# Patient Record
Sex: Female | Born: 1935 | Race: White | Hispanic: No | Marital: Married | Smoking: Never smoker
Health system: Southern US, Community
[De-identification: ages and names within clinical notes are randomized; demographics above are authoritative.]

## PROBLEM LIST (undated history)

## (undated) DIAGNOSIS — I2510 Atherosclerotic heart disease of native coronary artery without angina pectoris: Secondary | ICD-10-CM

## (undated) DIAGNOSIS — J45909 Unspecified asthma, uncomplicated: Secondary | ICD-10-CM

## (undated) DIAGNOSIS — E119 Type 2 diabetes mellitus without complications: Secondary | ICD-10-CM

## (undated) DIAGNOSIS — N189 Chronic kidney disease, unspecified: Principal | ICD-10-CM

## (undated) DIAGNOSIS — Z955 Presence of coronary angioplasty implant and graft: Secondary | ICD-10-CM

## (undated) DIAGNOSIS — J449 Chronic obstructive pulmonary disease, unspecified: Secondary | ICD-10-CM

## (undated) DIAGNOSIS — I679 Cerebrovascular disease, unspecified: Secondary | ICD-10-CM

## (undated) DIAGNOSIS — I4892 Unspecified atrial flutter: Secondary | ICD-10-CM

## (undated) DIAGNOSIS — E039 Hypothyroidism, unspecified: Secondary | ICD-10-CM

## (undated) DIAGNOSIS — I701 Atherosclerosis of renal artery: Secondary | ICD-10-CM

## (undated) HISTORY — DX: Type 2 diabetes mellitus without complications: E11.9

## (undated) HISTORY — DX: Atherosclerotic heart disease of native coronary artery without angina pectoris: I25.10

## (undated) HISTORY — PX: BACK SURGERY: SHX140

## (undated) HISTORY — DX: Atherosclerosis of renal artery: I70.1

## (undated) HISTORY — PX: TONSILLECTOMY: SUR1361

## (undated) HISTORY — PX: CHOLECYSTECTOMY: SHX55

## (undated) HISTORY — PX: CORONARY ARTERY BYPASS GRAFT: SHX141

## (undated) HISTORY — PX: OTHER SURGICAL HISTORY: SHX169

## (undated) HISTORY — DX: Hypothyroidism, unspecified: E03.9

## (undated) HISTORY — DX: Cerebrovascular disease, unspecified: I67.9

## (undated) HISTORY — PX: SHOULDER SURGERY: SHX246

## (undated) HISTORY — PX: KNEE SURGERY: SHX244

## (undated) HISTORY — PX: APPENDECTOMY: SHX54

## (undated) HISTORY — DX: Chronic kidney disease, unspecified: N18.9

## (undated) HISTORY — PX: NASAL SINUS SURGERY: SHX719

## (undated) HISTORY — DX: Unspecified asthma, uncomplicated: J45.909

## (undated) HISTORY — DX: Unspecified atrial flutter: I48.92

## (undated) HISTORY — DX: Presence of coronary angioplasty implant and graft: Z95.5

## (undated) HISTORY — PX: ABDOMINAL HYSTERECTOMY: SHX81

## (undated) HISTORY — DX: Chronic obstructive pulmonary disease, unspecified: J44.9

---

## 2011-09-18 DIAGNOSIS — I798 Other disorders of arteries, arterioles and capillaries in diseases classified elsewhere: Secondary | ICD-10-CM | POA: Diagnosis present

## 2011-09-18 DIAGNOSIS — D72829 Elevated white blood cell count, unspecified: Secondary | ICD-10-CM | POA: Diagnosis not present

## 2011-09-18 DIAGNOSIS — J9600 Acute respiratory failure, unspecified whether with hypoxia or hypercapnia: Secondary | ICD-10-CM | POA: Diagnosis not present

## 2011-09-18 DIAGNOSIS — A419 Sepsis, unspecified organism: Secondary | ICD-10-CM | POA: Diagnosis not present

## 2011-09-18 DIAGNOSIS — D68318 Other hemorrhagic disorder due to intrinsic circulating anticoagulants, antibodies, or inhibitors: Secondary | ICD-10-CM | POA: Diagnosis not present

## 2011-09-18 DIAGNOSIS — I509 Heart failure, unspecified: Secondary | ICD-10-CM | POA: Diagnosis not present

## 2011-09-18 DIAGNOSIS — D62 Acute posthemorrhagic anemia: Secondary | ICD-10-CM | POA: Diagnosis not present

## 2011-09-18 DIAGNOSIS — N179 Acute kidney failure, unspecified: Secondary | ICD-10-CM | POA: Diagnosis not present

## 2011-09-18 DIAGNOSIS — D509 Iron deficiency anemia, unspecified: Secondary | ICD-10-CM | POA: Diagnosis not present

## 2011-09-18 DIAGNOSIS — Z7901 Long term (current) use of anticoagulants: Secondary | ICD-10-CM | POA: Diagnosis not present

## 2011-09-18 DIAGNOSIS — N281 Cyst of kidney, acquired: Secondary | ICD-10-CM | POA: Diagnosis not present

## 2011-09-18 DIAGNOSIS — Z951 Presence of aortocoronary bypass graft: Secondary | ICD-10-CM | POA: Diagnosis not present

## 2011-09-18 DIAGNOSIS — N135 Crossing vessel and stricture of ureter without hydronephrosis: Secondary | ICD-10-CM | POA: Diagnosis not present

## 2011-09-18 DIAGNOSIS — E119 Type 2 diabetes mellitus without complications: Secondary | ICD-10-CM | POA: Diagnosis not present

## 2011-09-18 DIAGNOSIS — J449 Chronic obstructive pulmonary disease, unspecified: Secondary | ICD-10-CM | POA: Diagnosis present

## 2011-09-18 DIAGNOSIS — N189 Chronic kidney disease, unspecified: Secondary | ICD-10-CM | POA: Diagnosis not present

## 2011-09-18 DIAGNOSIS — R011 Cardiac murmur, unspecified: Secondary | ICD-10-CM | POA: Diagnosis not present

## 2011-09-18 DIAGNOSIS — E875 Hyperkalemia: Secondary | ICD-10-CM | POA: Diagnosis not present

## 2011-09-18 DIAGNOSIS — B9689 Other specified bacterial agents as the cause of diseases classified elsewhere: Secondary | ICD-10-CM | POA: Diagnosis not present

## 2011-09-18 DIAGNOSIS — N209 Urinary calculus, unspecified: Secondary | ICD-10-CM | POA: Diagnosis not present

## 2011-09-18 DIAGNOSIS — R7989 Other specified abnormal findings of blood chemistry: Secondary | ICD-10-CM | POA: Diagnosis not present

## 2011-09-18 DIAGNOSIS — T8389XA Other specified complication of genitourinary prosthetic devices, implants and grafts, initial encounter: Secondary | ICD-10-CM | POA: Diagnosis not present

## 2011-09-18 DIAGNOSIS — N1330 Unspecified hydronephrosis: Secondary | ICD-10-CM | POA: Diagnosis present

## 2011-09-18 DIAGNOSIS — I129 Hypertensive chronic kidney disease with stage 1 through stage 4 chronic kidney disease, or unspecified chronic kidney disease: Secondary | ICD-10-CM | POA: Diagnosis present

## 2011-09-18 DIAGNOSIS — Z7982 Long term (current) use of aspirin: Secondary | ICD-10-CM | POA: Diagnosis not present

## 2011-09-18 DIAGNOSIS — N186 End stage renal disease: Secondary | ICD-10-CM | POA: Diagnosis not present

## 2011-09-18 DIAGNOSIS — Z96659 Presence of unspecified artificial knee joint: Secondary | ICD-10-CM | POA: Diagnosis not present

## 2011-09-18 DIAGNOSIS — D684 Acquired coagulation factor deficiency: Secondary | ICD-10-CM | POA: Diagnosis not present

## 2011-09-18 DIAGNOSIS — N390 Urinary tract infection, site not specified: Secondary | ICD-10-CM | POA: Diagnosis not present

## 2011-09-18 DIAGNOSIS — J180 Bronchopneumonia, unspecified organism: Secondary | ICD-10-CM | POA: Diagnosis not present

## 2011-09-18 DIAGNOSIS — N170 Acute kidney failure with tubular necrosis: Secondary | ICD-10-CM | POA: Diagnosis not present

## 2011-09-18 DIAGNOSIS — J90 Pleural effusion, not elsewhere classified: Secondary | ICD-10-CM | POA: Diagnosis not present

## 2011-09-18 DIAGNOSIS — N159 Renal tubulo-interstitial disease, unspecified: Secondary | ICD-10-CM | POA: Diagnosis not present

## 2011-09-18 DIAGNOSIS — Z96649 Presence of unspecified artificial hip joint: Secondary | ICD-10-CM | POA: Diagnosis not present

## 2011-09-18 DIAGNOSIS — E1159 Type 2 diabetes mellitus with other circulatory complications: Secondary | ICD-10-CM | POA: Diagnosis present

## 2011-09-18 DIAGNOSIS — N200 Calculus of kidney: Secondary | ICD-10-CM | POA: Diagnosis not present

## 2011-09-18 DIAGNOSIS — J189 Pneumonia, unspecified organism: Secondary | ICD-10-CM | POA: Diagnosis not present

## 2011-09-18 DIAGNOSIS — I10 Essential (primary) hypertension: Secondary | ICD-10-CM | POA: Diagnosis not present

## 2011-09-18 DIAGNOSIS — R079 Chest pain, unspecified: Secondary | ICD-10-CM | POA: Diagnosis not present

## 2011-10-05 DIAGNOSIS — N170 Acute kidney failure with tubular necrosis: Secondary | ICD-10-CM | POA: Diagnosis not present

## 2011-10-05 DIAGNOSIS — I4891 Unspecified atrial fibrillation: Secondary | ICD-10-CM | POA: Diagnosis present

## 2011-10-05 DIAGNOSIS — J449 Chronic obstructive pulmonary disease, unspecified: Secondary | ICD-10-CM | POA: Diagnosis present

## 2011-10-05 DIAGNOSIS — I509 Heart failure, unspecified: Secondary | ICD-10-CM | POA: Diagnosis present

## 2011-10-05 DIAGNOSIS — N179 Acute kidney failure, unspecified: Secondary | ICD-10-CM | POA: Diagnosis not present

## 2011-10-05 DIAGNOSIS — D62 Acute posthemorrhagic anemia: Secondary | ICD-10-CM | POA: Diagnosis not present

## 2011-10-05 DIAGNOSIS — N281 Cyst of kidney, acquired: Secondary | ICD-10-CM | POA: Diagnosis not present

## 2011-10-05 DIAGNOSIS — M7730 Calcaneal spur, unspecified foot: Secondary | ICD-10-CM | POA: Diagnosis not present

## 2011-10-05 DIAGNOSIS — M79609 Pain in unspecified limb: Secondary | ICD-10-CM | POA: Diagnosis not present

## 2011-10-05 DIAGNOSIS — N189 Chronic kidney disease, unspecified: Secondary | ICD-10-CM | POA: Diagnosis not present

## 2011-10-05 DIAGNOSIS — R918 Other nonspecific abnormal finding of lung field: Secondary | ICD-10-CM | POA: Diagnosis not present

## 2011-10-05 DIAGNOSIS — J90 Pleural effusion, not elsewhere classified: Secondary | ICD-10-CM | POA: Diagnosis not present

## 2011-10-05 DIAGNOSIS — R0602 Shortness of breath: Secondary | ICD-10-CM | POA: Diagnosis not present

## 2011-10-05 DIAGNOSIS — J9600 Acute respiratory failure, unspecified whether with hypoxia or hypercapnia: Secondary | ICD-10-CM | POA: Diagnosis not present

## 2011-10-05 DIAGNOSIS — R05 Cough: Secondary | ICD-10-CM | POA: Diagnosis not present

## 2011-10-05 DIAGNOSIS — J180 Bronchopneumonia, unspecified organism: Secondary | ICD-10-CM | POA: Diagnosis not present

## 2011-10-05 DIAGNOSIS — R059 Cough, unspecified: Secondary | ICD-10-CM | POA: Diagnosis not present

## 2011-10-05 DIAGNOSIS — Z792 Long term (current) use of antibiotics: Secondary | ICD-10-CM | POA: Diagnosis not present

## 2011-10-05 DIAGNOSIS — N200 Calculus of kidney: Secondary | ICD-10-CM | POA: Diagnosis not present

## 2011-10-05 DIAGNOSIS — A419 Sepsis, unspecified organism: Secondary | ICD-10-CM | POA: Diagnosis not present

## 2011-10-05 DIAGNOSIS — E119 Type 2 diabetes mellitus without complications: Secondary | ICD-10-CM | POA: Diagnosis not present

## 2011-10-05 DIAGNOSIS — N390 Urinary tract infection, site not specified: Secondary | ICD-10-CM | POA: Diagnosis not present

## 2011-10-05 DIAGNOSIS — D649 Anemia, unspecified: Secondary | ICD-10-CM | POA: Diagnosis not present

## 2011-10-05 DIAGNOSIS — J189 Pneumonia, unspecified organism: Secondary | ICD-10-CM | POA: Diagnosis present

## 2011-10-05 DIAGNOSIS — N1330 Unspecified hydronephrosis: Secondary | ICD-10-CM | POA: Diagnosis not present

## 2011-10-05 DIAGNOSIS — M25879 Other specified joint disorders, unspecified ankle and foot: Secondary | ICD-10-CM | POA: Diagnosis not present

## 2011-10-05 DIAGNOSIS — N289 Disorder of kidney and ureter, unspecified: Secondary | ICD-10-CM | POA: Diagnosis not present

## 2011-10-05 DIAGNOSIS — J984 Other disorders of lung: Secondary | ICD-10-CM | POA: Diagnosis not present

## 2011-10-06 DIAGNOSIS — J9600 Acute respiratory failure, unspecified whether with hypoxia or hypercapnia: Secondary | ICD-10-CM | POA: Diagnosis not present

## 2011-10-07 DIAGNOSIS — R918 Other nonspecific abnormal finding of lung field: Secondary | ICD-10-CM | POA: Diagnosis not present

## 2011-10-07 DIAGNOSIS — J9600 Acute respiratory failure, unspecified whether with hypoxia or hypercapnia: Secondary | ICD-10-CM | POA: Diagnosis not present

## 2011-10-10 DIAGNOSIS — R0602 Shortness of breath: Secondary | ICD-10-CM | POA: Diagnosis not present

## 2011-10-12 DIAGNOSIS — R918 Other nonspecific abnormal finding of lung field: Secondary | ICD-10-CM | POA: Diagnosis not present

## 2011-10-13 DIAGNOSIS — R059 Cough, unspecified: Secondary | ICD-10-CM | POA: Diagnosis not present

## 2011-10-13 DIAGNOSIS — R05 Cough: Secondary | ICD-10-CM | POA: Diagnosis not present

## 2011-10-13 DIAGNOSIS — R918 Other nonspecific abnormal finding of lung field: Secondary | ICD-10-CM | POA: Diagnosis not present

## 2011-10-15 DIAGNOSIS — J189 Pneumonia, unspecified organism: Secondary | ICD-10-CM | POA: Diagnosis not present

## 2011-10-16 DIAGNOSIS — J984 Other disorders of lung: Secondary | ICD-10-CM | POA: Diagnosis not present

## 2011-10-16 DIAGNOSIS — J90 Pleural effusion, not elsewhere classified: Secondary | ICD-10-CM | POA: Diagnosis not present

## 2011-10-16 DIAGNOSIS — R0602 Shortness of breath: Secondary | ICD-10-CM | POA: Diagnosis not present

## 2011-10-17 DIAGNOSIS — N1330 Unspecified hydronephrosis: Secondary | ICD-10-CM | POA: Diagnosis not present

## 2011-10-17 DIAGNOSIS — N281 Cyst of kidney, acquired: Secondary | ICD-10-CM | POA: Diagnosis not present

## 2011-10-17 DIAGNOSIS — N200 Calculus of kidney: Secondary | ICD-10-CM | POA: Diagnosis not present

## 2011-10-18 DIAGNOSIS — M7730 Calcaneal spur, unspecified foot: Secondary | ICD-10-CM | POA: Diagnosis not present

## 2011-10-18 DIAGNOSIS — M79609 Pain in unspecified limb: Secondary | ICD-10-CM | POA: Diagnosis not present

## 2011-10-18 DIAGNOSIS — M25879 Other specified joint disorders, unspecified ankle and foot: Secondary | ICD-10-CM | POA: Diagnosis not present

## 2011-10-19 DIAGNOSIS — E119 Type 2 diabetes mellitus without complications: Secondary | ICD-10-CM | POA: Diagnosis not present

## 2011-10-19 DIAGNOSIS — Z5189 Encounter for other specified aftercare: Secondary | ICD-10-CM | POA: Diagnosis not present

## 2011-10-19 DIAGNOSIS — D649 Anemia, unspecified: Secondary | ICD-10-CM | POA: Diagnosis present

## 2011-10-19 DIAGNOSIS — E039 Hypothyroidism, unspecified: Secondary | ICD-10-CM | POA: Diagnosis present

## 2011-10-19 DIAGNOSIS — J90 Pleural effusion, not elsewhere classified: Secondary | ICD-10-CM | POA: Diagnosis not present

## 2011-10-19 DIAGNOSIS — E785 Hyperlipidemia, unspecified: Secondary | ICD-10-CM | POA: Diagnosis present

## 2011-10-19 DIAGNOSIS — N390 Urinary tract infection, site not specified: Secondary | ICD-10-CM | POA: Diagnosis present

## 2011-10-19 DIAGNOSIS — I509 Heart failure, unspecified: Secondary | ICD-10-CM | POA: Diagnosis present

## 2011-10-19 DIAGNOSIS — N19 Unspecified kidney failure: Secondary | ICD-10-CM | POA: Diagnosis not present

## 2011-10-19 DIAGNOSIS — B3749 Other urogenital candidiasis: Secondary | ICD-10-CM | POA: Diagnosis present

## 2011-10-19 DIAGNOSIS — E875 Hyperkalemia: Secondary | ICD-10-CM | POA: Diagnosis present

## 2011-10-19 DIAGNOSIS — I4891 Unspecified atrial fibrillation: Secondary | ICD-10-CM | POA: Diagnosis present

## 2011-10-19 DIAGNOSIS — F329 Major depressive disorder, single episode, unspecified: Secondary | ICD-10-CM | POA: Diagnosis present

## 2011-10-19 DIAGNOSIS — R5381 Other malaise: Secondary | ICD-10-CM | POA: Diagnosis not present

## 2011-10-19 DIAGNOSIS — J449 Chronic obstructive pulmonary disease, unspecified: Secondary | ICD-10-CM | POA: Diagnosis present

## 2011-10-19 DIAGNOSIS — Z7901 Long term (current) use of anticoagulants: Secondary | ICD-10-CM | POA: Diagnosis not present

## 2011-10-19 DIAGNOSIS — R269 Unspecified abnormalities of gait and mobility: Secondary | ICD-10-CM | POA: Diagnosis not present

## 2011-10-19 DIAGNOSIS — I2510 Atherosclerotic heart disease of native coronary artery without angina pectoris: Secondary | ICD-10-CM | POA: Diagnosis present

## 2011-10-19 DIAGNOSIS — G7281 Critical illness myopathy: Secondary | ICD-10-CM | POA: Diagnosis not present

## 2011-10-19 DIAGNOSIS — I10 Essential (primary) hypertension: Secondary | ICD-10-CM | POA: Diagnosis present

## 2011-11-02 DIAGNOSIS — N135 Crossing vessel and stricture of ureter without hydronephrosis: Secondary | ICD-10-CM | POA: Diagnosis not present

## 2011-11-04 DIAGNOSIS — I2510 Atherosclerotic heart disease of native coronary artery without angina pectoris: Secondary | ICD-10-CM | POA: Diagnosis not present

## 2011-11-04 DIAGNOSIS — N179 Acute kidney failure, unspecified: Secondary | ICD-10-CM | POA: Diagnosis not present

## 2011-11-04 DIAGNOSIS — I4891 Unspecified atrial fibrillation: Secondary | ICD-10-CM | POA: Diagnosis not present

## 2011-11-04 DIAGNOSIS — I509 Heart failure, unspecified: Secondary | ICD-10-CM | POA: Diagnosis not present

## 2011-11-04 DIAGNOSIS — I10 Essential (primary) hypertension: Secondary | ICD-10-CM | POA: Diagnosis not present

## 2011-11-04 DIAGNOSIS — E119 Type 2 diabetes mellitus without complications: Secondary | ICD-10-CM | POA: Diagnosis not present

## 2011-11-09 DIAGNOSIS — R404 Transient alteration of awareness: Secondary | ICD-10-CM | POA: Diagnosis not present

## 2011-11-09 DIAGNOSIS — E1169 Type 2 diabetes mellitus with other specified complication: Secondary | ICD-10-CM | POA: Diagnosis not present

## 2011-11-09 DIAGNOSIS — E1165 Type 2 diabetes mellitus with hyperglycemia: Secondary | ICD-10-CM | POA: Diagnosis not present

## 2011-11-09 DIAGNOSIS — N390 Urinary tract infection, site not specified: Secondary | ICD-10-CM | POA: Diagnosis not present

## 2011-11-09 DIAGNOSIS — E161 Other hypoglycemia: Secondary | ICD-10-CM | POA: Diagnosis not present

## 2011-11-15 DIAGNOSIS — I25810 Atherosclerosis of coronary artery bypass graft(s) without angina pectoris: Secondary | ICD-10-CM | POA: Diagnosis not present

## 2011-11-15 DIAGNOSIS — I6529 Occlusion and stenosis of unspecified carotid artery: Secondary | ICD-10-CM | POA: Diagnosis not present

## 2011-11-15 DIAGNOSIS — Z7901 Long term (current) use of anticoagulants: Secondary | ICD-10-CM | POA: Diagnosis not present

## 2011-11-15 DIAGNOSIS — I4891 Unspecified atrial fibrillation: Secondary | ICD-10-CM | POA: Diagnosis not present

## 2011-11-15 DIAGNOSIS — E119 Type 2 diabetes mellitus without complications: Secondary | ICD-10-CM | POA: Diagnosis not present

## 2011-11-17 DIAGNOSIS — M545 Low back pain, unspecified: Secondary | ICD-10-CM | POA: Diagnosis not present

## 2011-11-17 DIAGNOSIS — R262 Difficulty in walking, not elsewhere classified: Secondary | ICD-10-CM | POA: Diagnosis not present

## 2011-11-17 DIAGNOSIS — M5137 Other intervertebral disc degeneration, lumbosacral region: Secondary | ICD-10-CM | POA: Diagnosis not present

## 2011-11-17 DIAGNOSIS — R269 Unspecified abnormalities of gait and mobility: Secondary | ICD-10-CM | POA: Diagnosis not present

## 2011-11-19 DIAGNOSIS — M545 Low back pain, unspecified: Secondary | ICD-10-CM | POA: Diagnosis not present

## 2011-11-19 DIAGNOSIS — R269 Unspecified abnormalities of gait and mobility: Secondary | ICD-10-CM | POA: Diagnosis not present

## 2011-11-19 DIAGNOSIS — M5137 Other intervertebral disc degeneration, lumbosacral region: Secondary | ICD-10-CM | POA: Diagnosis not present

## 2011-11-19 DIAGNOSIS — R262 Difficulty in walking, not elsewhere classified: Secondary | ICD-10-CM | POA: Diagnosis not present

## 2011-11-22 DIAGNOSIS — M5137 Other intervertebral disc degeneration, lumbosacral region: Secondary | ICD-10-CM | POA: Diagnosis not present

## 2011-11-22 DIAGNOSIS — R269 Unspecified abnormalities of gait and mobility: Secondary | ICD-10-CM | POA: Diagnosis not present

## 2011-11-22 DIAGNOSIS — M545 Low back pain, unspecified: Secondary | ICD-10-CM | POA: Diagnosis not present

## 2011-11-22 DIAGNOSIS — R262 Difficulty in walking, not elsewhere classified: Secondary | ICD-10-CM | POA: Diagnosis not present

## 2011-11-23 DIAGNOSIS — E782 Mixed hyperlipidemia: Secondary | ICD-10-CM | POA: Diagnosis not present

## 2011-11-23 DIAGNOSIS — I2510 Atherosclerotic heart disease of native coronary artery without angina pectoris: Secondary | ICD-10-CM | POA: Diagnosis not present

## 2011-11-23 DIAGNOSIS — E119 Type 2 diabetes mellitus without complications: Secondary | ICD-10-CM | POA: Diagnosis not present

## 2011-11-23 DIAGNOSIS — I10 Essential (primary) hypertension: Secondary | ICD-10-CM | POA: Diagnosis not present

## 2011-11-24 DIAGNOSIS — M5137 Other intervertebral disc degeneration, lumbosacral region: Secondary | ICD-10-CM | POA: Diagnosis not present

## 2011-11-24 DIAGNOSIS — M545 Low back pain, unspecified: Secondary | ICD-10-CM | POA: Diagnosis not present

## 2011-11-24 DIAGNOSIS — R269 Unspecified abnormalities of gait and mobility: Secondary | ICD-10-CM | POA: Diagnosis not present

## 2011-11-24 DIAGNOSIS — R262 Difficulty in walking, not elsewhere classified: Secondary | ICD-10-CM | POA: Diagnosis not present

## 2011-11-26 DIAGNOSIS — R269 Unspecified abnormalities of gait and mobility: Secondary | ICD-10-CM | POA: Diagnosis not present

## 2011-11-26 DIAGNOSIS — M5137 Other intervertebral disc degeneration, lumbosacral region: Secondary | ICD-10-CM | POA: Diagnosis not present

## 2011-11-26 DIAGNOSIS — M545 Low back pain, unspecified: Secondary | ICD-10-CM | POA: Diagnosis not present

## 2011-11-26 DIAGNOSIS — R262 Difficulty in walking, not elsewhere classified: Secondary | ICD-10-CM | POA: Diagnosis not present

## 2011-11-29 DIAGNOSIS — R262 Difficulty in walking, not elsewhere classified: Secondary | ICD-10-CM | POA: Diagnosis not present

## 2011-11-29 DIAGNOSIS — R269 Unspecified abnormalities of gait and mobility: Secondary | ICD-10-CM | POA: Diagnosis not present

## 2011-11-29 DIAGNOSIS — M5137 Other intervertebral disc degeneration, lumbosacral region: Secondary | ICD-10-CM | POA: Diagnosis not present

## 2011-11-29 DIAGNOSIS — M545 Low back pain, unspecified: Secondary | ICD-10-CM | POA: Diagnosis not present

## 2011-12-01 DIAGNOSIS — M545 Low back pain, unspecified: Secondary | ICD-10-CM | POA: Diagnosis not present

## 2011-12-01 DIAGNOSIS — R269 Unspecified abnormalities of gait and mobility: Secondary | ICD-10-CM | POA: Diagnosis not present

## 2011-12-01 DIAGNOSIS — R262 Difficulty in walking, not elsewhere classified: Secondary | ICD-10-CM | POA: Diagnosis not present

## 2011-12-01 DIAGNOSIS — M5137 Other intervertebral disc degeneration, lumbosacral region: Secondary | ICD-10-CM | POA: Diagnosis not present

## 2011-12-03 DIAGNOSIS — M545 Low back pain, unspecified: Secondary | ICD-10-CM | POA: Diagnosis not present

## 2011-12-03 DIAGNOSIS — R269 Unspecified abnormalities of gait and mobility: Secondary | ICD-10-CM | POA: Diagnosis not present

## 2011-12-03 DIAGNOSIS — R262 Difficulty in walking, not elsewhere classified: Secondary | ICD-10-CM | POA: Diagnosis not present

## 2011-12-03 DIAGNOSIS — M5137 Other intervertebral disc degeneration, lumbosacral region: Secondary | ICD-10-CM | POA: Diagnosis not present

## 2011-12-06 DIAGNOSIS — R269 Unspecified abnormalities of gait and mobility: Secondary | ICD-10-CM | POA: Diagnosis not present

## 2011-12-06 DIAGNOSIS — M5137 Other intervertebral disc degeneration, lumbosacral region: Secondary | ICD-10-CM | POA: Diagnosis not present

## 2011-12-06 DIAGNOSIS — M545 Low back pain, unspecified: Secondary | ICD-10-CM | POA: Diagnosis not present

## 2011-12-06 DIAGNOSIS — R262 Difficulty in walking, not elsewhere classified: Secondary | ICD-10-CM | POA: Diagnosis not present

## 2011-12-07 DIAGNOSIS — Z7901 Long term (current) use of anticoagulants: Secondary | ICD-10-CM | POA: Diagnosis not present

## 2011-12-08 DIAGNOSIS — R262 Difficulty in walking, not elsewhere classified: Secondary | ICD-10-CM | POA: Diagnosis not present

## 2011-12-08 DIAGNOSIS — M545 Low back pain, unspecified: Secondary | ICD-10-CM | POA: Diagnosis not present

## 2011-12-08 DIAGNOSIS — R269 Unspecified abnormalities of gait and mobility: Secondary | ICD-10-CM | POA: Diagnosis not present

## 2011-12-08 DIAGNOSIS — M5137 Other intervertebral disc degeneration, lumbosacral region: Secondary | ICD-10-CM | POA: Diagnosis not present

## 2011-12-10 DIAGNOSIS — R262 Difficulty in walking, not elsewhere classified: Secondary | ICD-10-CM | POA: Diagnosis not present

## 2011-12-10 DIAGNOSIS — R269 Unspecified abnormalities of gait and mobility: Secondary | ICD-10-CM | POA: Diagnosis not present

## 2011-12-10 DIAGNOSIS — M545 Low back pain, unspecified: Secondary | ICD-10-CM | POA: Diagnosis not present

## 2011-12-10 DIAGNOSIS — M5137 Other intervertebral disc degeneration, lumbosacral region: Secondary | ICD-10-CM | POA: Diagnosis not present

## 2011-12-13 DIAGNOSIS — M545 Low back pain, unspecified: Secondary | ICD-10-CM | POA: Diagnosis not present

## 2011-12-13 DIAGNOSIS — R269 Unspecified abnormalities of gait and mobility: Secondary | ICD-10-CM | POA: Diagnosis not present

## 2011-12-13 DIAGNOSIS — M5137 Other intervertebral disc degeneration, lumbosacral region: Secondary | ICD-10-CM | POA: Diagnosis not present

## 2011-12-13 DIAGNOSIS — R262 Difficulty in walking, not elsewhere classified: Secondary | ICD-10-CM | POA: Diagnosis not present

## 2011-12-14 DIAGNOSIS — Z7901 Long term (current) use of anticoagulants: Secondary | ICD-10-CM | POA: Diagnosis not present

## 2011-12-15 DIAGNOSIS — M545 Low back pain, unspecified: Secondary | ICD-10-CM | POA: Diagnosis not present

## 2011-12-15 DIAGNOSIS — R269 Unspecified abnormalities of gait and mobility: Secondary | ICD-10-CM | POA: Diagnosis not present

## 2011-12-15 DIAGNOSIS — M5137 Other intervertebral disc degeneration, lumbosacral region: Secondary | ICD-10-CM | POA: Diagnosis not present

## 2011-12-15 DIAGNOSIS — R262 Difficulty in walking, not elsewhere classified: Secondary | ICD-10-CM | POA: Diagnosis not present

## 2011-12-22 DIAGNOSIS — N390 Urinary tract infection, site not specified: Secondary | ICD-10-CM | POA: Diagnosis not present

## 2011-12-22 DIAGNOSIS — R3915 Urgency of urination: Secondary | ICD-10-CM | POA: Diagnosis not present

## 2011-12-22 DIAGNOSIS — N3941 Urge incontinence: Secondary | ICD-10-CM | POA: Diagnosis not present

## 2011-12-22 DIAGNOSIS — N135 Crossing vessel and stricture of ureter without hydronephrosis: Secondary | ICD-10-CM | POA: Diagnosis not present

## 2012-01-04 DIAGNOSIS — Z7901 Long term (current) use of anticoagulants: Secondary | ICD-10-CM | POA: Diagnosis not present

## 2012-01-11 DIAGNOSIS — Z7901 Long term (current) use of anticoagulants: Secondary | ICD-10-CM | POA: Diagnosis not present

## 2012-01-17 DIAGNOSIS — N390 Urinary tract infection, site not specified: Secondary | ICD-10-CM | POA: Diagnosis not present

## 2012-01-17 DIAGNOSIS — N3941 Urge incontinence: Secondary | ICD-10-CM | POA: Diagnosis not present

## 2012-01-17 DIAGNOSIS — R3915 Urgency of urination: Secondary | ICD-10-CM | POA: Diagnosis not present

## 2012-01-18 DIAGNOSIS — N390 Urinary tract infection, site not specified: Secondary | ICD-10-CM | POA: Diagnosis not present

## 2012-01-19 DIAGNOSIS — N390 Urinary tract infection, site not specified: Secondary | ICD-10-CM | POA: Diagnosis not present

## 2012-01-20 DIAGNOSIS — N390 Urinary tract infection, site not specified: Secondary | ICD-10-CM | POA: Diagnosis not present

## 2012-01-21 DIAGNOSIS — N390 Urinary tract infection, site not specified: Secondary | ICD-10-CM | POA: Diagnosis not present

## 2012-01-22 DIAGNOSIS — N390 Urinary tract infection, site not specified: Secondary | ICD-10-CM | POA: Diagnosis not present

## 2012-01-23 DIAGNOSIS — N390 Urinary tract infection, site not specified: Secondary | ICD-10-CM | POA: Diagnosis not present

## 2012-01-24 DIAGNOSIS — N390 Urinary tract infection, site not specified: Secondary | ICD-10-CM | POA: Diagnosis not present

## 2012-02-15 DIAGNOSIS — R3915 Urgency of urination: Secondary | ICD-10-CM | POA: Diagnosis not present

## 2012-02-15 DIAGNOSIS — N3941 Urge incontinence: Secondary | ICD-10-CM | POA: Diagnosis not present

## 2012-02-15 DIAGNOSIS — N390 Urinary tract infection, site not specified: Secondary | ICD-10-CM | POA: Diagnosis not present

## 2012-02-15 DIAGNOSIS — N135 Crossing vessel and stricture of ureter without hydronephrosis: Secondary | ICD-10-CM | POA: Diagnosis not present

## 2012-02-18 DIAGNOSIS — Z87442 Personal history of urinary calculi: Secondary | ICD-10-CM | POA: Diagnosis not present

## 2012-02-18 DIAGNOSIS — N390 Urinary tract infection, site not specified: Secondary | ICD-10-CM | POA: Diagnosis not present

## 2012-02-18 DIAGNOSIS — Z7901 Long term (current) use of anticoagulants: Secondary | ICD-10-CM | POA: Diagnosis not present

## 2012-02-18 DIAGNOSIS — Z9889 Other specified postprocedural states: Secondary | ICD-10-CM | POA: Diagnosis not present

## 2012-02-18 DIAGNOSIS — N135 Crossing vessel and stricture of ureter without hydronephrosis: Secondary | ICD-10-CM | POA: Diagnosis not present

## 2012-02-18 DIAGNOSIS — N139 Obstructive and reflux uropathy, unspecified: Secondary | ICD-10-CM | POA: Diagnosis not present

## 2012-02-18 DIAGNOSIS — N1330 Unspecified hydronephrosis: Secondary | ICD-10-CM | POA: Diagnosis not present

## 2012-02-18 DIAGNOSIS — I700 Atherosclerosis of aorta: Secondary | ICD-10-CM | POA: Diagnosis not present

## 2012-02-18 DIAGNOSIS — N200 Calculus of kidney: Secondary | ICD-10-CM | POA: Diagnosis not present

## 2012-02-21 DIAGNOSIS — Z7901 Long term (current) use of anticoagulants: Secondary | ICD-10-CM | POA: Diagnosis not present

## 2012-02-22 DIAGNOSIS — I4891 Unspecified atrial fibrillation: Secondary | ICD-10-CM | POA: Diagnosis not present

## 2012-02-22 DIAGNOSIS — I25810 Atherosclerosis of coronary artery bypass graft(s) without angina pectoris: Secondary | ICD-10-CM | POA: Diagnosis not present

## 2012-02-22 DIAGNOSIS — E782 Mixed hyperlipidemia: Secondary | ICD-10-CM | POA: Diagnosis not present

## 2012-02-22 DIAGNOSIS — I10 Essential (primary) hypertension: Secondary | ICD-10-CM | POA: Diagnosis not present

## 2012-03-04 DIAGNOSIS — I509 Heart failure, unspecified: Secondary | ICD-10-CM | POA: Diagnosis present

## 2012-03-04 DIAGNOSIS — Z7901 Long term (current) use of anticoagulants: Secondary | ICD-10-CM | POA: Diagnosis not present

## 2012-03-04 DIAGNOSIS — Z9181 History of falling: Secondary | ICD-10-CM | POA: Diagnosis not present

## 2012-03-04 DIAGNOSIS — R1310 Dysphagia, unspecified: Secondary | ICD-10-CM | POA: Diagnosis not present

## 2012-03-04 DIAGNOSIS — J810 Acute pulmonary edema: Secondary | ICD-10-CM | POA: Diagnosis not present

## 2012-03-04 DIAGNOSIS — I2510 Atherosclerotic heart disease of native coronary artery without angina pectoris: Secondary | ICD-10-CM | POA: Diagnosis not present

## 2012-03-04 DIAGNOSIS — Z87440 Personal history of urinary (tract) infections: Secondary | ICD-10-CM | POA: Diagnosis not present

## 2012-03-04 DIAGNOSIS — E119 Type 2 diabetes mellitus without complications: Secondary | ICD-10-CM | POA: Diagnosis not present

## 2012-03-04 DIAGNOSIS — I700 Atherosclerosis of aorta: Secondary | ICD-10-CM | POA: Diagnosis not present

## 2012-03-04 DIAGNOSIS — J9819 Other pulmonary collapse: Secondary | ICD-10-CM | POA: Diagnosis not present

## 2012-03-04 DIAGNOSIS — R5381 Other malaise: Secondary | ICD-10-CM | POA: Diagnosis present

## 2012-03-04 DIAGNOSIS — N179 Acute kidney failure, unspecified: Secondary | ICD-10-CM | POA: Diagnosis not present

## 2012-03-04 DIAGNOSIS — I10 Essential (primary) hypertension: Secondary | ICD-10-CM | POA: Diagnosis not present

## 2012-03-04 DIAGNOSIS — N185 Chronic kidney disease, stage 5: Secondary | ICD-10-CM | POA: Diagnosis not present

## 2012-03-04 DIAGNOSIS — I798 Other disorders of arteries, arterioles and capillaries in diseases classified elsewhere: Secondary | ICD-10-CM | POA: Diagnosis present

## 2012-03-04 DIAGNOSIS — Z87442 Personal history of urinary calculi: Secondary | ICD-10-CM | POA: Diagnosis not present

## 2012-03-04 DIAGNOSIS — E1159 Type 2 diabetes mellitus with other circulatory complications: Secondary | ICD-10-CM | POA: Diagnosis not present

## 2012-03-04 DIAGNOSIS — J90 Pleural effusion, not elsewhere classified: Secondary | ICD-10-CM | POA: Diagnosis not present

## 2012-03-04 DIAGNOSIS — Z86718 Personal history of other venous thrombosis and embolism: Secondary | ICD-10-CM | POA: Diagnosis not present

## 2012-03-04 DIAGNOSIS — N10 Acute tubulo-interstitial nephritis: Secondary | ICD-10-CM | POA: Diagnosis not present

## 2012-03-04 DIAGNOSIS — J189 Pneumonia, unspecified organism: Secondary | ICD-10-CM | POA: Diagnosis not present

## 2012-03-04 DIAGNOSIS — J449 Chronic obstructive pulmonary disease, unspecified: Secondary | ICD-10-CM | POA: Diagnosis not present

## 2012-03-04 DIAGNOSIS — R030 Elevated blood-pressure reading, without diagnosis of hypertension: Secondary | ICD-10-CM | POA: Diagnosis not present

## 2012-03-04 DIAGNOSIS — F329 Major depressive disorder, single episode, unspecified: Secondary | ICD-10-CM | POA: Diagnosis present

## 2012-03-04 DIAGNOSIS — Z452 Encounter for adjustment and management of vascular access device: Secondary | ICD-10-CM | POA: Diagnosis not present

## 2012-03-04 DIAGNOSIS — R109 Unspecified abdominal pain: Secondary | ICD-10-CM | POA: Diagnosis not present

## 2012-03-04 DIAGNOSIS — N189 Chronic kidney disease, unspecified: Secondary | ICD-10-CM | POA: Diagnosis not present

## 2012-03-04 DIAGNOSIS — R059 Cough, unspecified: Secondary | ICD-10-CM | POA: Diagnosis not present

## 2012-03-04 DIAGNOSIS — N170 Acute kidney failure with tubular necrosis: Secondary | ICD-10-CM | POA: Diagnosis not present

## 2012-03-04 DIAGNOSIS — R05 Cough: Secondary | ICD-10-CM | POA: Diagnosis not present

## 2012-03-04 DIAGNOSIS — N201 Calculus of ureter: Secondary | ICD-10-CM | POA: Diagnosis not present

## 2012-03-04 DIAGNOSIS — I5032 Chronic diastolic (congestive) heart failure: Secondary | ICD-10-CM | POA: Insufficient documentation

## 2012-03-04 DIAGNOSIS — J690 Pneumonitis due to inhalation of food and vomit: Secondary | ICD-10-CM | POA: Diagnosis not present

## 2012-03-04 DIAGNOSIS — N390 Urinary tract infection, site not specified: Secondary | ICD-10-CM | POA: Diagnosis not present

## 2012-03-04 DIAGNOSIS — I129 Hypertensive chronic kidney disease with stage 1 through stage 4 chronic kidney disease, or unspecified chronic kidney disease: Secondary | ICD-10-CM | POA: Diagnosis present

## 2012-03-04 DIAGNOSIS — N1330 Unspecified hydronephrosis: Secondary | ICD-10-CM | POA: Diagnosis not present

## 2012-03-04 DIAGNOSIS — J811 Chronic pulmonary edema: Secondary | ICD-10-CM | POA: Diagnosis not present

## 2012-03-04 DIAGNOSIS — N200 Calculus of kidney: Secondary | ICD-10-CM | POA: Diagnosis not present

## 2012-03-04 DIAGNOSIS — R197 Diarrhea, unspecified: Secondary | ICD-10-CM | POA: Diagnosis not present

## 2012-03-04 DIAGNOSIS — I519 Heart disease, unspecified: Secondary | ICD-10-CM | POA: Diagnosis present

## 2012-03-04 DIAGNOSIS — N135 Crossing vessel and stricture of ureter without hydronephrosis: Secondary | ICD-10-CM | POA: Diagnosis present

## 2012-03-04 DIAGNOSIS — N19 Unspecified kidney failure: Secondary | ICD-10-CM | POA: Diagnosis not present

## 2012-03-04 DIAGNOSIS — E875 Hyperkalemia: Secondary | ICD-10-CM | POA: Diagnosis not present

## 2012-03-04 DIAGNOSIS — I5022 Chronic systolic (congestive) heart failure: Secondary | ICD-10-CM | POA: Diagnosis not present

## 2012-03-09 DIAGNOSIS — N189 Chronic kidney disease, unspecified: Secondary | ICD-10-CM | POA: Diagnosis not present

## 2012-03-17 DIAGNOSIS — R0602 Shortness of breath: Secondary | ICD-10-CM | POA: Diagnosis not present

## 2012-03-17 DIAGNOSIS — N201 Calculus of ureter: Secondary | ICD-10-CM | POA: Diagnosis not present

## 2012-03-17 DIAGNOSIS — J449 Chronic obstructive pulmonary disease, unspecified: Secondary | ICD-10-CM | POA: Diagnosis not present

## 2012-03-17 DIAGNOSIS — M25559 Pain in unspecified hip: Secondary | ICD-10-CM | POA: Diagnosis not present

## 2012-03-17 DIAGNOSIS — E119 Type 2 diabetes mellitus without complications: Secondary | ICD-10-CM | POA: Diagnosis not present

## 2012-03-17 DIAGNOSIS — J45909 Unspecified asthma, uncomplicated: Secondary | ICD-10-CM | POA: Diagnosis not present

## 2012-03-17 DIAGNOSIS — N10 Acute tubulo-interstitial nephritis: Secondary | ICD-10-CM | POA: Diagnosis not present

## 2012-03-17 DIAGNOSIS — J9819 Other pulmonary collapse: Secondary | ICD-10-CM | POA: Diagnosis not present

## 2012-03-17 DIAGNOSIS — Z452 Encounter for adjustment and management of vascular access device: Secondary | ICD-10-CM | POA: Diagnosis not present

## 2012-03-17 DIAGNOSIS — G9341 Metabolic encephalopathy: Secondary | ICD-10-CM | POA: Diagnosis not present

## 2012-03-17 DIAGNOSIS — Z7401 Bed confinement status: Secondary | ICD-10-CM | POA: Diagnosis not present

## 2012-03-17 DIAGNOSIS — Z951 Presence of aortocoronary bypass graft: Secondary | ICD-10-CM | POA: Diagnosis not present

## 2012-03-17 DIAGNOSIS — Z792 Long term (current) use of antibiotics: Secondary | ICD-10-CM | POA: Diagnosis not present

## 2012-03-17 DIAGNOSIS — E43 Unspecified severe protein-calorie malnutrition: Secondary | ICD-10-CM | POA: Diagnosis present

## 2012-03-17 DIAGNOSIS — N189 Chronic kidney disease, unspecified: Secondary | ICD-10-CM | POA: Diagnosis not present

## 2012-03-17 DIAGNOSIS — R918 Other nonspecific abnormal finding of lung field: Secondary | ICD-10-CM | POA: Diagnosis not present

## 2012-03-17 DIAGNOSIS — Z8701 Personal history of pneumonia (recurrent): Secondary | ICD-10-CM | POA: Diagnosis not present

## 2012-03-17 DIAGNOSIS — Z466 Encounter for fitting and adjustment of urinary device: Secondary | ICD-10-CM | POA: Diagnosis not present

## 2012-03-17 DIAGNOSIS — S72009A Fracture of unspecified part of neck of unspecified femur, initial encounter for closed fracture: Secondary | ICD-10-CM | POA: Diagnosis not present

## 2012-03-17 DIAGNOSIS — Z79899 Other long term (current) drug therapy: Secondary | ICD-10-CM | POA: Diagnosis not present

## 2012-03-17 DIAGNOSIS — J9600 Acute respiratory failure, unspecified whether with hypoxia or hypercapnia: Secondary | ICD-10-CM | POA: Diagnosis not present

## 2012-03-17 DIAGNOSIS — A419 Sepsis, unspecified organism: Secondary | ICD-10-CM | POA: Diagnosis not present

## 2012-03-17 DIAGNOSIS — N179 Acute kidney failure, unspecified: Secondary | ICD-10-CM | POA: Diagnosis present

## 2012-03-17 DIAGNOSIS — T83511A Infection and inflammatory reaction due to indwelling urethral catheter, initial encounter: Secondary | ICD-10-CM | POA: Diagnosis not present

## 2012-03-17 DIAGNOSIS — Z794 Long term (current) use of insulin: Secondary | ICD-10-CM | POA: Diagnosis not present

## 2012-03-17 DIAGNOSIS — N1330 Unspecified hydronephrosis: Secondary | ICD-10-CM | POA: Diagnosis not present

## 2012-03-17 DIAGNOSIS — S72109A Unspecified trochanteric fracture of unspecified femur, initial encounter for closed fracture: Secondary | ICD-10-CM | POA: Diagnosis not present

## 2012-03-17 DIAGNOSIS — Z87442 Personal history of urinary calculi: Secondary | ICD-10-CM | POA: Diagnosis not present

## 2012-03-17 DIAGNOSIS — Z9981 Dependence on supplemental oxygen: Secondary | ICD-10-CM | POA: Diagnosis not present

## 2012-03-17 DIAGNOSIS — IMO0002 Reserved for concepts with insufficient information to code with codable children: Secondary | ICD-10-CM | POA: Diagnosis not present

## 2012-03-17 DIAGNOSIS — N134 Hydroureter: Secondary | ICD-10-CM | POA: Diagnosis not present

## 2012-03-17 DIAGNOSIS — N9989 Other postprocedural complications and disorders of genitourinary system: Secondary | ICD-10-CM | POA: Diagnosis not present

## 2012-03-17 DIAGNOSIS — S72143A Displaced intertrochanteric fracture of unspecified femur, initial encounter for closed fracture: Secondary | ICD-10-CM | POA: Diagnosis not present

## 2012-03-17 DIAGNOSIS — J9610 Chronic respiratory failure, unspecified whether with hypoxia or hypercapnia: Secondary | ICD-10-CM | POA: Diagnosis not present

## 2012-03-17 DIAGNOSIS — T8389XA Other specified complication of genitourinary prosthetic devices, implants and grafts, initial encounter: Secondary | ICD-10-CM | POA: Diagnosis not present

## 2012-03-17 DIAGNOSIS — E039 Hypothyroidism, unspecified: Secondary | ICD-10-CM | POA: Diagnosis present

## 2012-03-17 DIAGNOSIS — S79919A Unspecified injury of unspecified hip, initial encounter: Secondary | ICD-10-CM | POA: Diagnosis not present

## 2012-03-17 DIAGNOSIS — N289 Disorder of kidney and ureter, unspecified: Secondary | ICD-10-CM | POA: Diagnosis not present

## 2012-03-17 DIAGNOSIS — Z992 Dependence on renal dialysis: Secondary | ICD-10-CM | POA: Diagnosis not present

## 2012-03-18 DIAGNOSIS — J9819 Other pulmonary collapse: Secondary | ICD-10-CM | POA: Diagnosis not present

## 2012-03-18 DIAGNOSIS — Z452 Encounter for adjustment and management of vascular access device: Secondary | ICD-10-CM | POA: Diagnosis not present

## 2012-03-31 DIAGNOSIS — N179 Acute kidney failure, unspecified: Secondary | ICD-10-CM | POA: Diagnosis not present

## 2012-04-03 DIAGNOSIS — N1330 Unspecified hydronephrosis: Secondary | ICD-10-CM | POA: Diagnosis not present

## 2012-04-04 DIAGNOSIS — Z452 Encounter for adjustment and management of vascular access device: Secondary | ICD-10-CM | POA: Diagnosis not present

## 2012-04-04 DIAGNOSIS — R918 Other nonspecific abnormal finding of lung field: Secondary | ICD-10-CM | POA: Diagnosis not present

## 2012-04-06 DIAGNOSIS — N1330 Unspecified hydronephrosis: Secondary | ICD-10-CM | POA: Diagnosis not present

## 2012-04-06 DIAGNOSIS — N134 Hydroureter: Secondary | ICD-10-CM | POA: Diagnosis not present

## 2012-04-10 DIAGNOSIS — J9600 Acute respiratory failure, unspecified whether with hypoxia or hypercapnia: Secondary | ICD-10-CM | POA: Diagnosis not present

## 2012-04-13 DIAGNOSIS — N179 Acute kidney failure, unspecified: Secondary | ICD-10-CM | POA: Diagnosis not present

## 2012-04-13 DIAGNOSIS — Z466 Encounter for fitting and adjustment of urinary device: Secondary | ICD-10-CM | POA: Diagnosis not present

## 2012-04-13 DIAGNOSIS — N201 Calculus of ureter: Secondary | ICD-10-CM | POA: Diagnosis not present

## 2012-04-14 DIAGNOSIS — M25559 Pain in unspecified hip: Secondary | ICD-10-CM | POA: Diagnosis not present

## 2012-04-14 DIAGNOSIS — S79919A Unspecified injury of unspecified hip, initial encounter: Secondary | ICD-10-CM | POA: Diagnosis not present

## 2012-04-15 DIAGNOSIS — S72109A Unspecified trochanteric fracture of unspecified femur, initial encounter for closed fracture: Secondary | ICD-10-CM | POA: Diagnosis not present

## 2012-04-16 DIAGNOSIS — Z951 Presence of aortocoronary bypass graft: Secondary | ICD-10-CM | POA: Diagnosis not present

## 2012-04-16 DIAGNOSIS — R0602 Shortness of breath: Secondary | ICD-10-CM | POA: Diagnosis not present

## 2012-04-16 DIAGNOSIS — Z452 Encounter for adjustment and management of vascular access device: Secondary | ICD-10-CM | POA: Diagnosis not present

## 2012-04-20 DIAGNOSIS — F329 Major depressive disorder, single episode, unspecified: Secondary | ICD-10-CM | POA: Diagnosis present

## 2012-04-20 DIAGNOSIS — J449 Chronic obstructive pulmonary disease, unspecified: Secondary | ICD-10-CM | POA: Diagnosis present

## 2012-04-20 DIAGNOSIS — G9341 Metabolic encephalopathy: Secondary | ICD-10-CM | POA: Diagnosis present

## 2012-04-20 DIAGNOSIS — E871 Hypo-osmolality and hyponatremia: Secondary | ICD-10-CM | POA: Diagnosis not present

## 2012-04-20 DIAGNOSIS — E785 Hyperlipidemia, unspecified: Secondary | ICD-10-CM | POA: Diagnosis present

## 2012-04-20 DIAGNOSIS — D631 Anemia in chronic kidney disease: Secondary | ICD-10-CM | POA: Diagnosis present

## 2012-04-20 DIAGNOSIS — B965 Pseudomonas (aeruginosa) (mallei) (pseudomallei) as the cause of diseases classified elsewhere: Secondary | ICD-10-CM | POA: Diagnosis present

## 2012-04-20 DIAGNOSIS — I129 Hypertensive chronic kidney disease with stage 1 through stage 4 chronic kidney disease, or unspecified chronic kidney disease: Secondary | ICD-10-CM | POA: Diagnosis present

## 2012-04-20 DIAGNOSIS — I509 Heart failure, unspecified: Secondary | ICD-10-CM | POA: Diagnosis present

## 2012-04-20 DIAGNOSIS — N183 Chronic kidney disease, stage 3 unspecified: Secondary | ICD-10-CM | POA: Diagnosis present

## 2012-04-20 DIAGNOSIS — I2510 Atherosclerotic heart disease of native coronary artery without angina pectoris: Secondary | ICD-10-CM | POA: Diagnosis not present

## 2012-04-20 DIAGNOSIS — R5381 Other malaise: Secondary | ICD-10-CM | POA: Diagnosis present

## 2012-04-20 DIAGNOSIS — N1330 Unspecified hydronephrosis: Secondary | ICD-10-CM | POA: Diagnosis present

## 2012-04-20 DIAGNOSIS — I4891 Unspecified atrial fibrillation: Secondary | ICD-10-CM | POA: Diagnosis not present

## 2012-04-20 DIAGNOSIS — S72109A Unspecified trochanteric fracture of unspecified femur, initial encounter for closed fracture: Secondary | ICD-10-CM | POA: Diagnosis present

## 2012-04-20 DIAGNOSIS — E039 Hypothyroidism, unspecified: Secondary | ICD-10-CM | POA: Diagnosis present

## 2012-04-20 DIAGNOSIS — IMO0002 Reserved for concepts with insufficient information to code with codable children: Secondary | ICD-10-CM | POA: Diagnosis present

## 2012-04-20 DIAGNOSIS — N289 Disorder of kidney and ureter, unspecified: Secondary | ICD-10-CM | POA: Diagnosis not present

## 2012-04-20 DIAGNOSIS — N19 Unspecified kidney failure: Secondary | ICD-10-CM | POA: Diagnosis not present

## 2012-04-20 DIAGNOSIS — N390 Urinary tract infection, site not specified: Secondary | ICD-10-CM | POA: Diagnosis present

## 2012-04-20 DIAGNOSIS — N179 Acute kidney failure, unspecified: Secondary | ICD-10-CM | POA: Diagnosis present

## 2012-04-20 DIAGNOSIS — I5022 Chronic systolic (congestive) heart failure: Secondary | ICD-10-CM | POA: Diagnosis present

## 2012-04-20 DIAGNOSIS — E872 Acidosis: Secondary | ICD-10-CM | POA: Diagnosis present

## 2012-04-20 DIAGNOSIS — N189 Chronic kidney disease, unspecified: Secondary | ICD-10-CM | POA: Diagnosis not present

## 2012-04-20 DIAGNOSIS — N9989 Other postprocedural complications and disorders of genitourinary system: Secondary | ICD-10-CM | POA: Diagnosis present

## 2012-04-27 DIAGNOSIS — G909 Disorder of the autonomic nervous system, unspecified: Secondary | ICD-10-CM | POA: Diagnosis present

## 2012-04-27 DIAGNOSIS — Z96649 Presence of unspecified artificial hip joint: Secondary | ICD-10-CM | POA: Diagnosis not present

## 2012-04-27 DIAGNOSIS — E119 Type 2 diabetes mellitus without complications: Secondary | ICD-10-CM | POA: Diagnosis not present

## 2012-04-27 DIAGNOSIS — I5022 Chronic systolic (congestive) heart failure: Secondary | ICD-10-CM | POA: Diagnosis present

## 2012-04-27 DIAGNOSIS — N200 Calculus of kidney: Secondary | ICD-10-CM | POA: Diagnosis present

## 2012-04-27 DIAGNOSIS — R269 Unspecified abnormalities of gait and mobility: Secondary | ICD-10-CM | POA: Diagnosis present

## 2012-04-27 DIAGNOSIS — G9341 Metabolic encephalopathy: Secondary | ICD-10-CM | POA: Diagnosis not present

## 2012-04-27 DIAGNOSIS — E1149 Type 2 diabetes mellitus with other diabetic neurological complication: Secondary | ICD-10-CM | POA: Diagnosis present

## 2012-04-27 DIAGNOSIS — Z96659 Presence of unspecified artificial knee joint: Secondary | ICD-10-CM | POA: Diagnosis not present

## 2012-04-27 DIAGNOSIS — Z86718 Personal history of other venous thrombosis and embolism: Secondary | ICD-10-CM | POA: Diagnosis not present

## 2012-04-27 DIAGNOSIS — R5381 Other malaise: Secondary | ICD-10-CM | POA: Diagnosis not present

## 2012-04-27 DIAGNOSIS — IMO0002 Reserved for concepts with insufficient information to code with codable children: Secondary | ICD-10-CM | POA: Diagnosis not present

## 2012-04-27 DIAGNOSIS — J449 Chronic obstructive pulmonary disease, unspecified: Secondary | ICD-10-CM | POA: Diagnosis present

## 2012-04-27 DIAGNOSIS — N1330 Unspecified hydronephrosis: Secondary | ICD-10-CM | POA: Diagnosis not present

## 2012-04-27 DIAGNOSIS — Z436 Encounter for attention to other artificial openings of urinary tract: Secondary | ICD-10-CM | POA: Diagnosis not present

## 2012-04-27 DIAGNOSIS — N390 Urinary tract infection, site not specified: Secondary | ICD-10-CM | POA: Diagnosis not present

## 2012-04-27 DIAGNOSIS — Z5189 Encounter for other specified aftercare: Secondary | ICD-10-CM | POA: Diagnosis not present

## 2012-04-27 DIAGNOSIS — N178 Other acute kidney failure: Secondary | ICD-10-CM | POA: Diagnosis not present

## 2012-04-27 DIAGNOSIS — I4891 Unspecified atrial fibrillation: Secondary | ICD-10-CM | POA: Diagnosis present

## 2012-04-27 DIAGNOSIS — N183 Chronic kidney disease, stage 3 unspecified: Secondary | ICD-10-CM | POA: Diagnosis present

## 2012-04-27 DIAGNOSIS — N289 Disorder of kidney and ureter, unspecified: Secondary | ICD-10-CM | POA: Diagnosis not present

## 2012-04-27 DIAGNOSIS — S72009D Fracture of unspecified part of neck of unspecified femur, subsequent encounter for closed fracture with routine healing: Secondary | ICD-10-CM | POA: Diagnosis not present

## 2012-04-27 DIAGNOSIS — E46 Unspecified protein-calorie malnutrition: Secondary | ICD-10-CM | POA: Diagnosis present

## 2012-04-27 DIAGNOSIS — E875 Hyperkalemia: Secondary | ICD-10-CM | POA: Diagnosis not present

## 2012-04-27 DIAGNOSIS — I10 Essential (primary) hypertension: Secondary | ICD-10-CM | POA: Diagnosis not present

## 2012-04-27 DIAGNOSIS — I129 Hypertensive chronic kidney disease with stage 1 through stage 4 chronic kidney disease, or unspecified chronic kidney disease: Secondary | ICD-10-CM | POA: Diagnosis present

## 2012-04-27 DIAGNOSIS — N9989 Other postprocedural complications and disorders of genitourinary system: Secondary | ICD-10-CM | POA: Diagnosis not present

## 2012-04-27 DIAGNOSIS — E039 Hypothyroidism, unspecified: Secondary | ICD-10-CM | POA: Diagnosis present

## 2012-04-27 DIAGNOSIS — R1310 Dysphagia, unspecified: Secondary | ICD-10-CM | POA: Diagnosis not present

## 2012-04-27 DIAGNOSIS — I509 Heart failure, unspecified: Secondary | ICD-10-CM | POA: Diagnosis present

## 2012-04-27 DIAGNOSIS — N189 Chronic kidney disease, unspecified: Secondary | ICD-10-CM | POA: Diagnosis not present

## 2012-04-27 DIAGNOSIS — S72109A Unspecified trochanteric fracture of unspecified femur, initial encounter for closed fracture: Secondary | ICD-10-CM | POA: Diagnosis not present

## 2012-04-27 DIAGNOSIS — N179 Acute kidney failure, unspecified: Secondary | ICD-10-CM | POA: Diagnosis not present

## 2012-04-27 DIAGNOSIS — D649 Anemia, unspecified: Secondary | ICD-10-CM | POA: Diagnosis present

## 2012-04-27 DIAGNOSIS — E871 Hypo-osmolality and hyponatremia: Secondary | ICD-10-CM | POA: Diagnosis not present

## 2012-04-27 DIAGNOSIS — D539 Nutritional anemia, unspecified: Secondary | ICD-10-CM | POA: Diagnosis not present

## 2012-05-23 DIAGNOSIS — I509 Heart failure, unspecified: Secondary | ICD-10-CM | POA: Diagnosis not present

## 2012-05-24 DIAGNOSIS — N183 Chronic kidney disease, stage 3 unspecified: Secondary | ICD-10-CM | POA: Diagnosis not present

## 2012-05-24 DIAGNOSIS — Z87442 Personal history of urinary calculi: Secondary | ICD-10-CM | POA: Diagnosis not present

## 2012-05-24 DIAGNOSIS — I2510 Atherosclerotic heart disease of native coronary artery without angina pectoris: Secondary | ICD-10-CM | POA: Diagnosis not present

## 2012-05-24 DIAGNOSIS — E119 Type 2 diabetes mellitus without complications: Secondary | ICD-10-CM | POA: Diagnosis not present

## 2012-05-25 DIAGNOSIS — D649 Anemia, unspecified: Secondary | ICD-10-CM | POA: Diagnosis not present

## 2012-05-25 DIAGNOSIS — I2510 Atherosclerotic heart disease of native coronary artery without angina pectoris: Secondary | ICD-10-CM | POA: Diagnosis not present

## 2012-05-25 DIAGNOSIS — Z87442 Personal history of urinary calculi: Secondary | ICD-10-CM | POA: Diagnosis not present

## 2012-05-25 DIAGNOSIS — E119 Type 2 diabetes mellitus without complications: Secondary | ICD-10-CM | POA: Diagnosis not present

## 2012-05-25 DIAGNOSIS — I4891 Unspecified atrial fibrillation: Secondary | ICD-10-CM | POA: Diagnosis not present

## 2012-05-25 DIAGNOSIS — N183 Chronic kidney disease, stage 3 unspecified: Secondary | ICD-10-CM | POA: Diagnosis not present

## 2012-05-27 DIAGNOSIS — M6250 Muscle wasting and atrophy, not elsewhere classified, unspecified site: Secondary | ICD-10-CM | POA: Diagnosis not present

## 2012-05-27 DIAGNOSIS — N179 Acute kidney failure, unspecified: Secondary | ICD-10-CM | POA: Diagnosis present

## 2012-05-27 DIAGNOSIS — Z87440 Personal history of urinary (tract) infections: Secondary | ICD-10-CM | POA: Diagnosis not present

## 2012-05-27 DIAGNOSIS — E039 Hypothyroidism, unspecified: Secondary | ICD-10-CM | POA: Diagnosis present

## 2012-05-27 DIAGNOSIS — I2510 Atherosclerotic heart disease of native coronary artery without angina pectoris: Secondary | ICD-10-CM | POA: Diagnosis present

## 2012-05-27 DIAGNOSIS — J449 Chronic obstructive pulmonary disease, unspecified: Secondary | ICD-10-CM | POA: Diagnosis present

## 2012-05-27 DIAGNOSIS — A419 Sepsis, unspecified organism: Secondary | ICD-10-CM | POA: Diagnosis not present

## 2012-05-27 DIAGNOSIS — I10 Essential (primary) hypertension: Secondary | ICD-10-CM | POA: Diagnosis not present

## 2012-05-27 DIAGNOSIS — Z01818 Encounter for other preprocedural examination: Secondary | ICD-10-CM | POA: Diagnosis not present

## 2012-05-27 DIAGNOSIS — N12 Tubulo-interstitial nephritis, not specified as acute or chronic: Secondary | ICD-10-CM | POA: Diagnosis not present

## 2012-05-27 DIAGNOSIS — B3749 Other urogenital candidiasis: Secondary | ICD-10-CM | POA: Diagnosis present

## 2012-05-27 DIAGNOSIS — I252 Old myocardial infarction: Secondary | ICD-10-CM | POA: Diagnosis not present

## 2012-05-27 DIAGNOSIS — J309 Allergic rhinitis, unspecified: Secondary | ICD-10-CM | POA: Diagnosis not present

## 2012-05-27 DIAGNOSIS — N189 Chronic kidney disease, unspecified: Secondary | ICD-10-CM | POA: Diagnosis not present

## 2012-05-27 DIAGNOSIS — R509 Fever, unspecified: Secondary | ICD-10-CM | POA: Diagnosis not present

## 2012-05-27 DIAGNOSIS — N10 Acute tubulo-interstitial nephritis: Secondary | ICD-10-CM | POA: Diagnosis not present

## 2012-05-27 DIAGNOSIS — F3289 Other specified depressive episodes: Secondary | ICD-10-CM | POA: Diagnosis not present

## 2012-05-27 DIAGNOSIS — I129 Hypertensive chronic kidney disease with stage 1 through stage 4 chronic kidney disease, or unspecified chronic kidney disease: Secondary | ICD-10-CM | POA: Diagnosis present

## 2012-05-27 DIAGNOSIS — R269 Unspecified abnormalities of gait and mobility: Secondary | ICD-10-CM | POA: Diagnosis not present

## 2012-05-27 DIAGNOSIS — E119 Type 2 diabetes mellitus without complications: Secondary | ICD-10-CM | POA: Diagnosis not present

## 2012-05-27 DIAGNOSIS — M6281 Muscle weakness (generalized): Secondary | ICD-10-CM | POA: Diagnosis not present

## 2012-05-27 DIAGNOSIS — N289 Disorder of kidney and ureter, unspecified: Secondary | ICD-10-CM | POA: Diagnosis not present

## 2012-05-27 DIAGNOSIS — N1330 Unspecified hydronephrosis: Secondary | ICD-10-CM | POA: Diagnosis not present

## 2012-05-27 DIAGNOSIS — Z951 Presence of aortocoronary bypass graft: Secondary | ICD-10-CM | POA: Diagnosis not present

## 2012-05-27 DIAGNOSIS — A4902 Methicillin resistant Staphylococcus aureus infection, unspecified site: Secondary | ICD-10-CM | POA: Diagnosis present

## 2012-05-27 DIAGNOSIS — E871 Hypo-osmolality and hyponatremia: Secondary | ICD-10-CM | POA: Diagnosis present

## 2012-05-27 DIAGNOSIS — R52 Pain, unspecified: Secondary | ICD-10-CM | POA: Diagnosis not present

## 2012-05-27 DIAGNOSIS — J42 Unspecified chronic bronchitis: Secondary | ICD-10-CM | POA: Diagnosis not present

## 2012-05-27 DIAGNOSIS — E789 Disorder of lipoprotein metabolism, unspecified: Secondary | ICD-10-CM | POA: Diagnosis not present

## 2012-05-27 DIAGNOSIS — IMO0002 Reserved for concepts with insufficient information to code with codable children: Secondary | ICD-10-CM | POA: Diagnosis not present

## 2012-05-27 DIAGNOSIS — N135 Crossing vessel and stricture of ureter without hydronephrosis: Secondary | ICD-10-CM | POA: Diagnosis not present

## 2012-05-27 DIAGNOSIS — IMO0001 Reserved for inherently not codable concepts without codable children: Secondary | ICD-10-CM | POA: Diagnosis not present

## 2012-05-27 DIAGNOSIS — E876 Hypokalemia: Secondary | ICD-10-CM | POA: Diagnosis not present

## 2012-05-27 DIAGNOSIS — F329 Major depressive disorder, single episode, unspecified: Secondary | ICD-10-CM | POA: Diagnosis present

## 2012-05-27 DIAGNOSIS — R279 Unspecified lack of coordination: Secondary | ICD-10-CM | POA: Diagnosis not present

## 2012-05-27 DIAGNOSIS — E7800 Pure hypercholesterolemia, unspecified: Secondary | ICD-10-CM | POA: Diagnosis present

## 2012-05-27 DIAGNOSIS — N059 Unspecified nephritic syndrome with unspecified morphologic changes: Secondary | ICD-10-CM | POA: Diagnosis not present

## 2012-05-27 DIAGNOSIS — K219 Gastro-esophageal reflux disease without esophagitis: Secondary | ICD-10-CM | POA: Diagnosis present

## 2012-05-27 DIAGNOSIS — N390 Urinary tract infection, site not specified: Secondary | ICD-10-CM | POA: Diagnosis not present

## 2012-05-27 DIAGNOSIS — B965 Pseudomonas (aeruginosa) (mallei) (pseudomallei) as the cause of diseases classified elsewhere: Secondary | ICD-10-CM | POA: Diagnosis present

## 2012-05-27 DIAGNOSIS — G589 Mononeuropathy, unspecified: Secondary | ICD-10-CM | POA: Diagnosis not present

## 2012-05-27 DIAGNOSIS — N9989 Other postprocedural complications and disorders of genitourinary system: Secondary | ICD-10-CM | POA: Diagnosis not present

## 2012-05-27 DIAGNOSIS — T83511A Infection and inflammatory reaction due to indwelling urethral catheter, initial encounter: Secondary | ICD-10-CM | POA: Diagnosis not present

## 2012-06-01 DIAGNOSIS — E139 Other specified diabetes mellitus without complications: Secondary | ICD-10-CM | POA: Diagnosis not present

## 2012-06-01 DIAGNOSIS — Z9861 Coronary angioplasty status: Secondary | ICD-10-CM | POA: Diagnosis not present

## 2012-06-01 DIAGNOSIS — E789 Disorder of lipoprotein metabolism, unspecified: Secondary | ICD-10-CM | POA: Diagnosis not present

## 2012-06-01 DIAGNOSIS — N135 Crossing vessel and stricture of ureter without hydronephrosis: Secondary | ICD-10-CM | POA: Diagnosis not present

## 2012-06-01 DIAGNOSIS — J95821 Acute postprocedural respiratory failure: Secondary | ICD-10-CM | POA: Diagnosis not present

## 2012-06-01 DIAGNOSIS — I4891 Unspecified atrial fibrillation: Secondary | ICD-10-CM | POA: Diagnosis present

## 2012-06-01 DIAGNOSIS — N059 Unspecified nephritic syndrome with unspecified morphologic changes: Secondary | ICD-10-CM | POA: Diagnosis not present

## 2012-06-01 DIAGNOSIS — F3289 Other specified depressive episodes: Secondary | ICD-10-CM | POA: Diagnosis not present

## 2012-06-01 DIAGNOSIS — N179 Acute kidney failure, unspecified: Secondary | ICD-10-CM | POA: Diagnosis present

## 2012-06-01 DIAGNOSIS — J189 Pneumonia, unspecified organism: Secondary | ICD-10-CM | POA: Diagnosis not present

## 2012-06-01 DIAGNOSIS — G589 Mononeuropathy, unspecified: Secondary | ICD-10-CM | POA: Diagnosis not present

## 2012-06-01 DIAGNOSIS — M6250 Muscle wasting and atrophy, not elsewhere classified, unspecified site: Secondary | ICD-10-CM | POA: Diagnosis not present

## 2012-06-01 DIAGNOSIS — K219 Gastro-esophageal reflux disease without esophagitis: Secondary | ICD-10-CM | POA: Diagnosis not present

## 2012-06-01 DIAGNOSIS — E119 Type 2 diabetes mellitus without complications: Secondary | ICD-10-CM | POA: Diagnosis present

## 2012-06-01 DIAGNOSIS — E039 Hypothyroidism, unspecified: Secondary | ICD-10-CM | POA: Diagnosis present

## 2012-06-01 DIAGNOSIS — I509 Heart failure, unspecified: Secondary | ICD-10-CM | POA: Diagnosis present

## 2012-06-01 DIAGNOSIS — M069 Rheumatoid arthritis, unspecified: Secondary | ICD-10-CM | POA: Diagnosis present

## 2012-06-01 DIAGNOSIS — E876 Hypokalemia: Secondary | ICD-10-CM | POA: Diagnosis not present

## 2012-06-01 DIAGNOSIS — I10 Essential (primary) hypertension: Secondary | ICD-10-CM | POA: Diagnosis not present

## 2012-06-01 DIAGNOSIS — Z87442 Personal history of urinary calculi: Secondary | ICD-10-CM | POA: Diagnosis not present

## 2012-06-01 DIAGNOSIS — IMO0001 Reserved for inherently not codable concepts without codable children: Secondary | ICD-10-CM | POA: Diagnosis not present

## 2012-06-01 DIAGNOSIS — I2510 Atherosclerotic heart disease of native coronary artery without angina pectoris: Secondary | ICD-10-CM | POA: Diagnosis present

## 2012-06-01 DIAGNOSIS — S72109A Unspecified trochanteric fracture of unspecified femur, initial encounter for closed fracture: Secondary | ICD-10-CM | POA: Diagnosis not present

## 2012-06-01 DIAGNOSIS — D649 Anemia, unspecified: Secondary | ICD-10-CM | POA: Diagnosis not present

## 2012-06-01 DIAGNOSIS — R279 Unspecified lack of coordination: Secondary | ICD-10-CM | POA: Diagnosis not present

## 2012-06-01 DIAGNOSIS — N189 Chronic kidney disease, unspecified: Secondary | ICD-10-CM | POA: Diagnosis present

## 2012-06-01 DIAGNOSIS — J309 Allergic rhinitis, unspecified: Secondary | ICD-10-CM | POA: Diagnosis not present

## 2012-06-01 DIAGNOSIS — J42 Unspecified chronic bronchitis: Secondary | ICD-10-CM | POA: Diagnosis not present

## 2012-06-01 DIAGNOSIS — M6281 Muscle weakness (generalized): Secondary | ICD-10-CM | POA: Diagnosis not present

## 2012-06-01 DIAGNOSIS — Z8701 Personal history of pneumonia (recurrent): Secondary | ICD-10-CM | POA: Diagnosis not present

## 2012-06-01 DIAGNOSIS — N12 Tubulo-interstitial nephritis, not specified as acute or chronic: Secondary | ICD-10-CM | POA: Diagnosis not present

## 2012-06-01 DIAGNOSIS — R52 Pain, unspecified: Secondary | ICD-10-CM | POA: Diagnosis not present

## 2012-06-01 DIAGNOSIS — N390 Urinary tract infection, site not specified: Secondary | ICD-10-CM | POA: Diagnosis not present

## 2012-06-01 DIAGNOSIS — I129 Hypertensive chronic kidney disease with stage 1 through stage 4 chronic kidney disease, or unspecified chronic kidney disease: Secondary | ICD-10-CM | POA: Diagnosis present

## 2012-06-01 DIAGNOSIS — Z951 Presence of aortocoronary bypass graft: Secondary | ICD-10-CM | POA: Diagnosis not present

## 2012-06-01 DIAGNOSIS — I5031 Acute diastolic (congestive) heart failure: Secondary | ICD-10-CM | POA: Diagnosis not present

## 2012-06-01 DIAGNOSIS — R269 Unspecified abnormalities of gait and mobility: Secondary | ICD-10-CM | POA: Diagnosis not present

## 2012-06-02 DIAGNOSIS — I10 Essential (primary) hypertension: Secondary | ICD-10-CM | POA: Diagnosis not present

## 2012-06-02 DIAGNOSIS — E139 Other specified diabetes mellitus without complications: Secondary | ICD-10-CM | POA: Diagnosis not present

## 2012-06-03 DIAGNOSIS — N189 Chronic kidney disease, unspecified: Secondary | ICD-10-CM | POA: Diagnosis not present

## 2012-06-05 DIAGNOSIS — I10 Essential (primary) hypertension: Secondary | ICD-10-CM | POA: Diagnosis not present

## 2012-06-05 DIAGNOSIS — E139 Other specified diabetes mellitus without complications: Secondary | ICD-10-CM | POA: Diagnosis not present

## 2012-06-08 DIAGNOSIS — S72109A Unspecified trochanteric fracture of unspecified femur, initial encounter for closed fracture: Secondary | ICD-10-CM | POA: Diagnosis not present

## 2012-06-09 DIAGNOSIS — N189 Chronic kidney disease, unspecified: Secondary | ICD-10-CM | POA: Diagnosis not present

## 2012-06-15 DIAGNOSIS — Z87442 Personal history of urinary calculi: Secondary | ICD-10-CM | POA: Diagnosis not present

## 2012-06-15 DIAGNOSIS — J189 Pneumonia, unspecified organism: Secondary | ICD-10-CM | POA: Diagnosis not present

## 2012-06-15 DIAGNOSIS — I509 Heart failure, unspecified: Secondary | ICD-10-CM | POA: Diagnosis not present

## 2012-06-15 DIAGNOSIS — E119 Type 2 diabetes mellitus without complications: Secondary | ICD-10-CM | POA: Diagnosis present

## 2012-06-15 DIAGNOSIS — N135 Crossing vessel and stricture of ureter without hydronephrosis: Secondary | ICD-10-CM | POA: Diagnosis not present

## 2012-06-15 DIAGNOSIS — R0989 Other specified symptoms and signs involving the circulatory and respiratory systems: Secondary | ICD-10-CM | POA: Diagnosis not present

## 2012-06-15 DIAGNOSIS — I2510 Atherosclerotic heart disease of native coronary artery without angina pectoris: Secondary | ICD-10-CM | POA: Diagnosis present

## 2012-06-15 DIAGNOSIS — Z951 Presence of aortocoronary bypass graft: Secondary | ICD-10-CM | POA: Diagnosis not present

## 2012-06-15 DIAGNOSIS — Z4682 Encounter for fitting and adjustment of non-vascular catheter: Secondary | ICD-10-CM | POA: Diagnosis not present

## 2012-06-15 DIAGNOSIS — E039 Hypothyroidism, unspecified: Secondary | ICD-10-CM | POA: Diagnosis present

## 2012-06-15 DIAGNOSIS — D649 Anemia, unspecified: Secondary | ICD-10-CM | POA: Diagnosis not present

## 2012-06-15 DIAGNOSIS — I129 Hypertensive chronic kidney disease with stage 1 through stage 4 chronic kidney disease, or unspecified chronic kidney disease: Secondary | ICD-10-CM | POA: Diagnosis present

## 2012-06-15 DIAGNOSIS — R918 Other nonspecific abnormal finding of lung field: Secondary | ICD-10-CM | POA: Diagnosis not present

## 2012-06-15 DIAGNOSIS — I5031 Acute diastolic (congestive) heart failure: Secondary | ICD-10-CM | POA: Diagnosis not present

## 2012-06-15 DIAGNOSIS — I4891 Unspecified atrial fibrillation: Secondary | ICD-10-CM | POA: Diagnosis present

## 2012-06-15 DIAGNOSIS — R0609 Other forms of dyspnea: Secondary | ICD-10-CM | POA: Diagnosis not present

## 2012-06-15 DIAGNOSIS — J95821 Acute postprocedural respiratory failure: Secondary | ICD-10-CM | POA: Diagnosis not present

## 2012-06-15 DIAGNOSIS — J90 Pleural effusion, not elsewhere classified: Secondary | ICD-10-CM | POA: Diagnosis not present

## 2012-06-15 DIAGNOSIS — Z8701 Personal history of pneumonia (recurrent): Secondary | ICD-10-CM | POA: Diagnosis not present

## 2012-06-15 DIAGNOSIS — Z9861 Coronary angioplasty status: Secondary | ICD-10-CM | POA: Diagnosis not present

## 2012-06-15 DIAGNOSIS — Z09 Encounter for follow-up examination after completed treatment for conditions other than malignant neoplasm: Secondary | ICD-10-CM | POA: Diagnosis not present

## 2012-06-15 DIAGNOSIS — N189 Chronic kidney disease, unspecified: Secondary | ICD-10-CM | POA: Diagnosis present

## 2012-06-15 DIAGNOSIS — M069 Rheumatoid arthritis, unspecified: Secondary | ICD-10-CM | POA: Diagnosis present

## 2012-06-15 DIAGNOSIS — J9600 Acute respiratory failure, unspecified whether with hypoxia or hypercapnia: Secondary | ICD-10-CM | POA: Diagnosis not present

## 2012-06-15 DIAGNOSIS — N179 Acute kidney failure, unspecified: Secondary | ICD-10-CM | POA: Diagnosis present

## 2012-06-20 DIAGNOSIS — N390 Urinary tract infection, site not specified: Secondary | ICD-10-CM | POA: Diagnosis present

## 2012-06-20 DIAGNOSIS — N179 Acute kidney failure, unspecified: Secondary | ICD-10-CM | POA: Diagnosis not present

## 2012-06-20 DIAGNOSIS — Z9981 Dependence on supplemental oxygen: Secondary | ICD-10-CM | POA: Diagnosis not present

## 2012-06-20 DIAGNOSIS — E119 Type 2 diabetes mellitus without complications: Secondary | ICD-10-CM | POA: Diagnosis not present

## 2012-06-20 DIAGNOSIS — D62 Acute posthemorrhagic anemia: Secondary | ICD-10-CM | POA: Diagnosis present

## 2012-06-20 DIAGNOSIS — J90 Pleural effusion, not elsewhere classified: Secondary | ICD-10-CM | POA: Diagnosis not present

## 2012-06-20 DIAGNOSIS — Z87442 Personal history of urinary calculi: Secondary | ICD-10-CM | POA: Diagnosis not present

## 2012-06-20 DIAGNOSIS — J45909 Unspecified asthma, uncomplicated: Secondary | ICD-10-CM | POA: Diagnosis not present

## 2012-06-20 DIAGNOSIS — N1330 Unspecified hydronephrosis: Secondary | ICD-10-CM | POA: Diagnosis present

## 2012-06-20 DIAGNOSIS — J189 Pneumonia, unspecified organism: Secondary | ICD-10-CM | POA: Diagnosis not present

## 2012-06-20 DIAGNOSIS — N289 Disorder of kidney and ureter, unspecified: Secondary | ICD-10-CM | POA: Diagnosis not present

## 2012-06-20 DIAGNOSIS — I10 Essential (primary) hypertension: Secondary | ICD-10-CM | POA: Diagnosis not present

## 2012-06-20 DIAGNOSIS — Z7401 Bed confinement status: Secondary | ICD-10-CM | POA: Diagnosis not present

## 2012-06-20 DIAGNOSIS — E440 Moderate protein-calorie malnutrition: Secondary | ICD-10-CM | POA: Diagnosis not present

## 2012-06-20 DIAGNOSIS — IMO0002 Reserved for concepts with insufficient information to code with codable children: Secondary | ICD-10-CM | POA: Diagnosis not present

## 2012-06-20 DIAGNOSIS — J449 Chronic obstructive pulmonary disease, unspecified: Secondary | ICD-10-CM | POA: Diagnosis not present

## 2012-06-20 DIAGNOSIS — I5031 Acute diastolic (congestive) heart failure: Secondary | ICD-10-CM | POA: Diagnosis present

## 2012-06-20 DIAGNOSIS — D631 Anemia in chronic kidney disease: Secondary | ICD-10-CM | POA: Diagnosis not present

## 2012-06-20 DIAGNOSIS — Z418 Encounter for other procedures for purposes other than remedying health state: Secondary | ICD-10-CM | POA: Diagnosis not present

## 2012-06-20 DIAGNOSIS — R0602 Shortness of breath: Secondary | ICD-10-CM | POA: Diagnosis not present

## 2012-06-20 DIAGNOSIS — J9600 Acute respiratory failure, unspecified whether with hypoxia or hypercapnia: Secondary | ICD-10-CM | POA: Diagnosis present

## 2012-06-20 DIAGNOSIS — Z8701 Personal history of pneumonia (recurrent): Secondary | ICD-10-CM | POA: Diagnosis not present

## 2012-06-20 DIAGNOSIS — E875 Hyperkalemia: Secondary | ICD-10-CM | POA: Diagnosis not present

## 2012-06-20 DIAGNOSIS — N323 Diverticulum of bladder: Secondary | ICD-10-CM | POA: Diagnosis not present

## 2012-06-20 DIAGNOSIS — N135 Crossing vessel and stricture of ureter without hydronephrosis: Secondary | ICD-10-CM | POA: Diagnosis not present

## 2012-06-20 DIAGNOSIS — N189 Chronic kidney disease, unspecified: Secondary | ICD-10-CM | POA: Diagnosis not present

## 2012-06-20 DIAGNOSIS — E43 Unspecified severe protein-calorie malnutrition: Secondary | ICD-10-CM | POA: Diagnosis present

## 2012-06-20 DIAGNOSIS — K565 Intestinal adhesions [bands], unspecified as to partial versus complete obstruction: Secondary | ICD-10-CM | POA: Diagnosis present

## 2012-06-20 DIAGNOSIS — N9989 Other postprocedural complications and disorders of genitourinary system: Secondary | ICD-10-CM | POA: Diagnosis not present

## 2012-06-20 DIAGNOSIS — Z79899 Other long term (current) drug therapy: Secondary | ICD-10-CM | POA: Diagnosis not present

## 2012-06-21 DIAGNOSIS — J9600 Acute respiratory failure, unspecified whether with hypoxia or hypercapnia: Secondary | ICD-10-CM | POA: Diagnosis not present

## 2012-06-26 DIAGNOSIS — R0602 Shortness of breath: Secondary | ICD-10-CM | POA: Diagnosis not present

## 2012-07-03 DIAGNOSIS — R0602 Shortness of breath: Secondary | ICD-10-CM | POA: Diagnosis not present

## 2012-07-04 DIAGNOSIS — N323 Diverticulum of bladder: Secondary | ICD-10-CM | POA: Diagnosis not present

## 2012-07-06 DIAGNOSIS — N135 Crossing vessel and stricture of ureter without hydronephrosis: Secondary | ICD-10-CM | POA: Diagnosis not present

## 2012-07-12 DIAGNOSIS — E119 Type 2 diabetes mellitus without complications: Secondary | ICD-10-CM | POA: Diagnosis not present

## 2012-07-12 DIAGNOSIS — N183 Chronic kidney disease, stage 3 unspecified: Secondary | ICD-10-CM | POA: Diagnosis not present

## 2012-07-13 DIAGNOSIS — I10 Essential (primary) hypertension: Secondary | ICD-10-CM | POA: Diagnosis not present

## 2012-07-13 DIAGNOSIS — N183 Chronic kidney disease, stage 3 unspecified: Secondary | ICD-10-CM | POA: Diagnosis not present

## 2012-07-13 DIAGNOSIS — I2510 Atherosclerotic heart disease of native coronary artery without angina pectoris: Secondary | ICD-10-CM | POA: Diagnosis not present

## 2012-07-13 DIAGNOSIS — Z8614 Personal history of Methicillin resistant Staphylococcus aureus infection: Secondary | ICD-10-CM | POA: Diagnosis not present

## 2012-07-16 DIAGNOSIS — I129 Hypertensive chronic kidney disease with stage 1 through stage 4 chronic kidney disease, or unspecified chronic kidney disease: Secondary | ICD-10-CM | POA: Diagnosis not present

## 2012-07-16 DIAGNOSIS — N183 Chronic kidney disease, stage 3 unspecified: Secondary | ICD-10-CM | POA: Diagnosis not present

## 2012-07-16 DIAGNOSIS — E119 Type 2 diabetes mellitus without complications: Secondary | ICD-10-CM | POA: Diagnosis not present

## 2012-07-16 DIAGNOSIS — Z9181 History of falling: Secondary | ICD-10-CM | POA: Diagnosis not present

## 2012-07-16 DIAGNOSIS — J449 Chronic obstructive pulmonary disease, unspecified: Secondary | ICD-10-CM | POA: Diagnosis not present

## 2012-07-16 DIAGNOSIS — I509 Heart failure, unspecified: Secondary | ICD-10-CM | POA: Diagnosis not present

## 2012-07-16 DIAGNOSIS — Z87440 Personal history of urinary (tract) infections: Secondary | ICD-10-CM | POA: Diagnosis not present

## 2012-07-17 DIAGNOSIS — Z9181 History of falling: Secondary | ICD-10-CM | POA: Diagnosis not present

## 2012-07-17 DIAGNOSIS — I509 Heart failure, unspecified: Secondary | ICD-10-CM | POA: Diagnosis not present

## 2012-07-17 DIAGNOSIS — J449 Chronic obstructive pulmonary disease, unspecified: Secondary | ICD-10-CM | POA: Diagnosis not present

## 2012-07-17 DIAGNOSIS — E119 Type 2 diabetes mellitus without complications: Secondary | ICD-10-CM | POA: Diagnosis not present

## 2012-07-17 DIAGNOSIS — N183 Chronic kidney disease, stage 3 unspecified: Secondary | ICD-10-CM | POA: Diagnosis not present

## 2012-07-17 DIAGNOSIS — I129 Hypertensive chronic kidney disease with stage 1 through stage 4 chronic kidney disease, or unspecified chronic kidney disease: Secondary | ICD-10-CM | POA: Diagnosis not present

## 2012-07-19 DIAGNOSIS — I129 Hypertensive chronic kidney disease with stage 1 through stage 4 chronic kidney disease, or unspecified chronic kidney disease: Secondary | ICD-10-CM | POA: Diagnosis not present

## 2012-07-19 DIAGNOSIS — I509 Heart failure, unspecified: Secondary | ICD-10-CM | POA: Diagnosis not present

## 2012-07-19 DIAGNOSIS — E119 Type 2 diabetes mellitus without complications: Secondary | ICD-10-CM | POA: Diagnosis not present

## 2012-07-19 DIAGNOSIS — N183 Chronic kidney disease, stage 3 unspecified: Secondary | ICD-10-CM | POA: Diagnosis not present

## 2012-07-19 DIAGNOSIS — Z9181 History of falling: Secondary | ICD-10-CM | POA: Diagnosis not present

## 2012-07-19 DIAGNOSIS — J449 Chronic obstructive pulmonary disease, unspecified: Secondary | ICD-10-CM | POA: Diagnosis not present

## 2012-07-20 DIAGNOSIS — J449 Chronic obstructive pulmonary disease, unspecified: Secondary | ICD-10-CM | POA: Diagnosis not present

## 2012-07-20 DIAGNOSIS — E119 Type 2 diabetes mellitus without complications: Secondary | ICD-10-CM | POA: Diagnosis not present

## 2012-07-20 DIAGNOSIS — I129 Hypertensive chronic kidney disease with stage 1 through stage 4 chronic kidney disease, or unspecified chronic kidney disease: Secondary | ICD-10-CM | POA: Diagnosis not present

## 2012-07-20 DIAGNOSIS — I509 Heart failure, unspecified: Secondary | ICD-10-CM | POA: Diagnosis not present

## 2012-07-20 DIAGNOSIS — N183 Chronic kidney disease, stage 3 unspecified: Secondary | ICD-10-CM | POA: Diagnosis not present

## 2012-07-20 DIAGNOSIS — Z9181 History of falling: Secondary | ICD-10-CM | POA: Diagnosis not present

## 2012-07-26 DIAGNOSIS — E119 Type 2 diabetes mellitus without complications: Secondary | ICD-10-CM | POA: Diagnosis not present

## 2012-07-26 DIAGNOSIS — N289 Disorder of kidney and ureter, unspecified: Secondary | ICD-10-CM | POA: Diagnosis not present

## 2012-07-26 DIAGNOSIS — I129 Hypertensive chronic kidney disease with stage 1 through stage 4 chronic kidney disease, or unspecified chronic kidney disease: Secondary | ICD-10-CM | POA: Diagnosis not present

## 2012-07-26 DIAGNOSIS — J449 Chronic obstructive pulmonary disease, unspecified: Secondary | ICD-10-CM | POA: Diagnosis not present

## 2012-07-26 DIAGNOSIS — Z9181 History of falling: Secondary | ICD-10-CM | POA: Diagnosis not present

## 2012-07-26 DIAGNOSIS — N183 Chronic kidney disease, stage 3 unspecified: Secondary | ICD-10-CM | POA: Diagnosis not present

## 2012-07-26 DIAGNOSIS — I509 Heart failure, unspecified: Secondary | ICD-10-CM | POA: Diagnosis not present

## 2012-07-28 DIAGNOSIS — I129 Hypertensive chronic kidney disease with stage 1 through stage 4 chronic kidney disease, or unspecified chronic kidney disease: Secondary | ICD-10-CM | POA: Diagnosis not present

## 2012-07-28 DIAGNOSIS — N183 Chronic kidney disease, stage 3 unspecified: Secondary | ICD-10-CM | POA: Diagnosis not present

## 2012-07-28 DIAGNOSIS — I509 Heart failure, unspecified: Secondary | ICD-10-CM | POA: Diagnosis not present

## 2012-07-28 DIAGNOSIS — E119 Type 2 diabetes mellitus without complications: Secondary | ICD-10-CM | POA: Diagnosis not present

## 2012-07-28 DIAGNOSIS — Z9181 History of falling: Secondary | ICD-10-CM | POA: Diagnosis not present

## 2012-07-28 DIAGNOSIS — J449 Chronic obstructive pulmonary disease, unspecified: Secondary | ICD-10-CM | POA: Diagnosis not present

## 2012-08-01 DIAGNOSIS — J449 Chronic obstructive pulmonary disease, unspecified: Secondary | ICD-10-CM | POA: Diagnosis not present

## 2012-08-01 DIAGNOSIS — Z9181 History of falling: Secondary | ICD-10-CM | POA: Diagnosis not present

## 2012-08-01 DIAGNOSIS — E119 Type 2 diabetes mellitus without complications: Secondary | ICD-10-CM | POA: Diagnosis not present

## 2012-08-01 DIAGNOSIS — N183 Chronic kidney disease, stage 3 unspecified: Secondary | ICD-10-CM | POA: Diagnosis not present

## 2012-08-01 DIAGNOSIS — I509 Heart failure, unspecified: Secondary | ICD-10-CM | POA: Diagnosis not present

## 2012-08-01 DIAGNOSIS — I129 Hypertensive chronic kidney disease with stage 1 through stage 4 chronic kidney disease, or unspecified chronic kidney disease: Secondary | ICD-10-CM | POA: Diagnosis not present

## 2012-08-05 DIAGNOSIS — J449 Chronic obstructive pulmonary disease, unspecified: Secondary | ICD-10-CM | POA: Diagnosis not present

## 2012-08-05 DIAGNOSIS — I129 Hypertensive chronic kidney disease with stage 1 through stage 4 chronic kidney disease, or unspecified chronic kidney disease: Secondary | ICD-10-CM | POA: Diagnosis not present

## 2012-08-05 DIAGNOSIS — E119 Type 2 diabetes mellitus without complications: Secondary | ICD-10-CM | POA: Diagnosis not present

## 2012-08-05 DIAGNOSIS — I509 Heart failure, unspecified: Secondary | ICD-10-CM | POA: Diagnosis not present

## 2012-08-05 DIAGNOSIS — N183 Chronic kidney disease, stage 3 unspecified: Secondary | ICD-10-CM | POA: Diagnosis not present

## 2012-08-05 DIAGNOSIS — Z9181 History of falling: Secondary | ICD-10-CM | POA: Diagnosis not present

## 2012-08-08 DIAGNOSIS — Z9181 History of falling: Secondary | ICD-10-CM | POA: Diagnosis not present

## 2012-08-08 DIAGNOSIS — J449 Chronic obstructive pulmonary disease, unspecified: Secondary | ICD-10-CM | POA: Diagnosis not present

## 2012-08-08 DIAGNOSIS — N183 Chronic kidney disease, stage 3 unspecified: Secondary | ICD-10-CM | POA: Diagnosis not present

## 2012-08-08 DIAGNOSIS — I509 Heart failure, unspecified: Secondary | ICD-10-CM | POA: Diagnosis not present

## 2012-08-08 DIAGNOSIS — I129 Hypertensive chronic kidney disease with stage 1 through stage 4 chronic kidney disease, or unspecified chronic kidney disease: Secondary | ICD-10-CM | POA: Diagnosis not present

## 2012-08-08 DIAGNOSIS — E119 Type 2 diabetes mellitus without complications: Secondary | ICD-10-CM | POA: Diagnosis not present

## 2012-08-09 DIAGNOSIS — E119 Type 2 diabetes mellitus without complications: Secondary | ICD-10-CM | POA: Diagnosis not present

## 2012-08-09 DIAGNOSIS — J449 Chronic obstructive pulmonary disease, unspecified: Secondary | ICD-10-CM | POA: Diagnosis not present

## 2012-08-09 DIAGNOSIS — I509 Heart failure, unspecified: Secondary | ICD-10-CM | POA: Diagnosis not present

## 2012-08-09 DIAGNOSIS — N183 Chronic kidney disease, stage 3 unspecified: Secondary | ICD-10-CM | POA: Diagnosis not present

## 2012-08-09 DIAGNOSIS — Z9181 History of falling: Secondary | ICD-10-CM | POA: Diagnosis not present

## 2012-08-09 DIAGNOSIS — I129 Hypertensive chronic kidney disease with stage 1 through stage 4 chronic kidney disease, or unspecified chronic kidney disease: Secondary | ICD-10-CM | POA: Diagnosis not present

## 2012-08-10 DIAGNOSIS — R350 Frequency of micturition: Secondary | ICD-10-CM | POA: Diagnosis not present

## 2012-08-10 DIAGNOSIS — R300 Dysuria: Secondary | ICD-10-CM | POA: Diagnosis not present

## 2012-08-12 DIAGNOSIS — E119 Type 2 diabetes mellitus without complications: Secondary | ICD-10-CM | POA: Diagnosis not present

## 2012-08-12 DIAGNOSIS — J449 Chronic obstructive pulmonary disease, unspecified: Secondary | ICD-10-CM | POA: Diagnosis not present

## 2012-08-12 DIAGNOSIS — N183 Chronic kidney disease, stage 3 unspecified: Secondary | ICD-10-CM | POA: Diagnosis not present

## 2012-08-12 DIAGNOSIS — Z9181 History of falling: Secondary | ICD-10-CM | POA: Diagnosis not present

## 2012-08-12 DIAGNOSIS — I129 Hypertensive chronic kidney disease with stage 1 through stage 4 chronic kidney disease, or unspecified chronic kidney disease: Secondary | ICD-10-CM | POA: Diagnosis not present

## 2012-08-12 DIAGNOSIS — I509 Heart failure, unspecified: Secondary | ICD-10-CM | POA: Diagnosis not present

## 2012-08-15 DIAGNOSIS — N289 Disorder of kidney and ureter, unspecified: Secondary | ICD-10-CM | POA: Diagnosis not present

## 2012-08-15 DIAGNOSIS — E119 Type 2 diabetes mellitus without complications: Secondary | ICD-10-CM | POA: Diagnosis not present

## 2012-08-15 DIAGNOSIS — E875 Hyperkalemia: Secondary | ICD-10-CM | POA: Diagnosis not present

## 2012-08-16 DIAGNOSIS — E119 Type 2 diabetes mellitus without complications: Secondary | ICD-10-CM | POA: Diagnosis not present

## 2012-08-16 DIAGNOSIS — I10 Essential (primary) hypertension: Secondary | ICD-10-CM | POA: Diagnosis not present

## 2012-08-16 DIAGNOSIS — N289 Disorder of kidney and ureter, unspecified: Secondary | ICD-10-CM | POA: Diagnosis not present

## 2012-08-17 DIAGNOSIS — I129 Hypertensive chronic kidney disease with stage 1 through stage 4 chronic kidney disease, or unspecified chronic kidney disease: Secondary | ICD-10-CM | POA: Diagnosis not present

## 2012-08-17 DIAGNOSIS — Z9181 History of falling: Secondary | ICD-10-CM | POA: Diagnosis not present

## 2012-08-17 DIAGNOSIS — J449 Chronic obstructive pulmonary disease, unspecified: Secondary | ICD-10-CM | POA: Diagnosis not present

## 2012-08-17 DIAGNOSIS — I509 Heart failure, unspecified: Secondary | ICD-10-CM | POA: Diagnosis not present

## 2012-08-17 DIAGNOSIS — E119 Type 2 diabetes mellitus without complications: Secondary | ICD-10-CM | POA: Diagnosis not present

## 2012-08-17 DIAGNOSIS — N183 Chronic kidney disease, stage 3 unspecified: Secondary | ICD-10-CM | POA: Diagnosis not present

## 2012-08-25 DIAGNOSIS — N289 Disorder of kidney and ureter, unspecified: Secondary | ICD-10-CM | POA: Diagnosis not present

## 2012-08-25 DIAGNOSIS — I10 Essential (primary) hypertension: Secondary | ICD-10-CM | POA: Diagnosis not present

## 2012-08-25 DIAGNOSIS — J449 Chronic obstructive pulmonary disease, unspecified: Secondary | ICD-10-CM | POA: Diagnosis not present

## 2012-08-25 DIAGNOSIS — E875 Hyperkalemia: Secondary | ICD-10-CM | POA: Diagnosis not present

## 2012-08-25 DIAGNOSIS — I129 Hypertensive chronic kidney disease with stage 1 through stage 4 chronic kidney disease, or unspecified chronic kidney disease: Secondary | ICD-10-CM | POA: Diagnosis not present

## 2012-08-25 DIAGNOSIS — N183 Chronic kidney disease, stage 3 unspecified: Secondary | ICD-10-CM | POA: Diagnosis not present

## 2012-08-25 DIAGNOSIS — R109 Unspecified abdominal pain: Secondary | ICD-10-CM | POA: Diagnosis not present

## 2012-08-25 DIAGNOSIS — I509 Heart failure, unspecified: Secondary | ICD-10-CM | POA: Diagnosis not present

## 2012-08-25 DIAGNOSIS — N19 Unspecified kidney failure: Secondary | ICD-10-CM | POA: Diagnosis not present

## 2012-08-25 DIAGNOSIS — N281 Cyst of kidney, acquired: Secondary | ICD-10-CM | POA: Diagnosis not present

## 2012-08-25 DIAGNOSIS — Z9181 History of falling: Secondary | ICD-10-CM | POA: Diagnosis not present

## 2012-08-25 DIAGNOSIS — E119 Type 2 diabetes mellitus without complications: Secondary | ICD-10-CM | POA: Diagnosis not present

## 2012-08-30 DIAGNOSIS — D631 Anemia in chronic kidney disease: Secondary | ICD-10-CM | POA: Diagnosis not present

## 2012-08-30 DIAGNOSIS — N390 Urinary tract infection, site not specified: Secondary | ICD-10-CM | POA: Diagnosis not present

## 2012-08-30 DIAGNOSIS — N289 Disorder of kidney and ureter, unspecified: Secondary | ICD-10-CM | POA: Diagnosis not present

## 2012-08-31 DIAGNOSIS — D631 Anemia in chronic kidney disease: Secondary | ICD-10-CM | POA: Diagnosis not present

## 2012-08-31 DIAGNOSIS — D649 Anemia, unspecified: Secondary | ICD-10-CM | POA: Diagnosis not present

## 2012-08-31 DIAGNOSIS — D72829 Elevated white blood cell count, unspecified: Secondary | ICD-10-CM | POA: Diagnosis not present

## 2012-08-31 DIAGNOSIS — N039 Chronic nephritic syndrome with unspecified morphologic changes: Secondary | ICD-10-CM | POA: Diagnosis not present

## 2012-09-11 DIAGNOSIS — E875 Hyperkalemia: Secondary | ICD-10-CM | POA: Diagnosis not present

## 2012-09-11 DIAGNOSIS — N039 Chronic nephritic syndrome with unspecified morphologic changes: Secondary | ICD-10-CM | POA: Diagnosis not present

## 2012-09-11 DIAGNOSIS — D631 Anemia in chronic kidney disease: Secondary | ICD-10-CM | POA: Diagnosis not present

## 2012-09-11 DIAGNOSIS — N289 Disorder of kidney and ureter, unspecified: Secondary | ICD-10-CM | POA: Diagnosis not present

## 2012-09-11 DIAGNOSIS — R7989 Other specified abnormal findings of blood chemistry: Secondary | ICD-10-CM | POA: Diagnosis not present

## 2012-09-12 DIAGNOSIS — E119 Type 2 diabetes mellitus without complications: Secondary | ICD-10-CM | POA: Diagnosis not present

## 2012-09-12 DIAGNOSIS — Z9181 History of falling: Secondary | ICD-10-CM | POA: Diagnosis not present

## 2012-09-12 DIAGNOSIS — J449 Chronic obstructive pulmonary disease, unspecified: Secondary | ICD-10-CM | POA: Diagnosis not present

## 2012-09-12 DIAGNOSIS — N183 Chronic kidney disease, stage 3 unspecified: Secondary | ICD-10-CM | POA: Diagnosis not present

## 2012-09-12 DIAGNOSIS — I509 Heart failure, unspecified: Secondary | ICD-10-CM | POA: Diagnosis not present

## 2012-09-12 DIAGNOSIS — I129 Hypertensive chronic kidney disease with stage 1 through stage 4 chronic kidney disease, or unspecified chronic kidney disease: Secondary | ICD-10-CM | POA: Diagnosis not present

## 2012-09-14 DIAGNOSIS — D509 Iron deficiency anemia, unspecified: Secondary | ICD-10-CM | POA: Diagnosis not present

## 2012-09-14 DIAGNOSIS — Z96659 Presence of unspecified artificial knee joint: Secondary | ICD-10-CM | POA: Diagnosis not present

## 2012-09-14 DIAGNOSIS — J449 Chronic obstructive pulmonary disease, unspecified: Secondary | ICD-10-CM | POA: Diagnosis not present

## 2012-09-14 DIAGNOSIS — E119 Type 2 diabetes mellitus without complications: Secondary | ICD-10-CM | POA: Diagnosis not present

## 2012-09-14 DIAGNOSIS — I129 Hypertensive chronic kidney disease with stage 1 through stage 4 chronic kidney disease, or unspecified chronic kidney disease: Secondary | ICD-10-CM | POA: Diagnosis not present

## 2012-09-14 DIAGNOSIS — I25810 Atherosclerosis of coronary artery bypass graft(s) without angina pectoris: Secondary | ICD-10-CM | POA: Diagnosis not present

## 2012-09-14 DIAGNOSIS — Z87440 Personal history of urinary (tract) infections: Secondary | ICD-10-CM | POA: Diagnosis not present

## 2012-09-14 DIAGNOSIS — Z9181 History of falling: Secondary | ICD-10-CM | POA: Diagnosis not present

## 2012-09-14 DIAGNOSIS — N183 Chronic kidney disease, stage 3 unspecified: Secondary | ICD-10-CM | POA: Diagnosis not present

## 2012-09-14 DIAGNOSIS — I509 Heart failure, unspecified: Secondary | ICD-10-CM | POA: Diagnosis not present

## 2012-09-20 DIAGNOSIS — J449 Chronic obstructive pulmonary disease, unspecified: Secondary | ICD-10-CM | POA: Diagnosis not present

## 2012-09-20 DIAGNOSIS — I509 Heart failure, unspecified: Secondary | ICD-10-CM | POA: Diagnosis not present

## 2012-09-20 DIAGNOSIS — I129 Hypertensive chronic kidney disease with stage 1 through stage 4 chronic kidney disease, or unspecified chronic kidney disease: Secondary | ICD-10-CM | POA: Diagnosis not present

## 2012-09-20 DIAGNOSIS — N183 Chronic kidney disease, stage 3 unspecified: Secondary | ICD-10-CM | POA: Diagnosis not present

## 2012-09-20 DIAGNOSIS — E119 Type 2 diabetes mellitus without complications: Secondary | ICD-10-CM | POA: Diagnosis not present

## 2012-09-20 DIAGNOSIS — D509 Iron deficiency anemia, unspecified: Secondary | ICD-10-CM | POA: Diagnosis not present

## 2012-09-25 DIAGNOSIS — E875 Hyperkalemia: Secondary | ICD-10-CM | POA: Diagnosis not present

## 2012-09-25 DIAGNOSIS — N289 Disorder of kidney and ureter, unspecified: Secondary | ICD-10-CM | POA: Diagnosis not present

## 2012-09-25 DIAGNOSIS — R7989 Other specified abnormal findings of blood chemistry: Secondary | ICD-10-CM | POA: Diagnosis not present

## 2012-09-25 DIAGNOSIS — D631 Anemia in chronic kidney disease: Secondary | ICD-10-CM | POA: Diagnosis not present

## 2012-09-25 DIAGNOSIS — N039 Chronic nephritic syndrome with unspecified morphologic changes: Secondary | ICD-10-CM | POA: Diagnosis not present

## 2012-09-27 DIAGNOSIS — D509 Iron deficiency anemia, unspecified: Secondary | ICD-10-CM | POA: Diagnosis not present

## 2012-09-27 DIAGNOSIS — I509 Heart failure, unspecified: Secondary | ICD-10-CM | POA: Diagnosis not present

## 2012-09-27 DIAGNOSIS — I129 Hypertensive chronic kidney disease with stage 1 through stage 4 chronic kidney disease, or unspecified chronic kidney disease: Secondary | ICD-10-CM | POA: Diagnosis not present

## 2012-09-27 DIAGNOSIS — E119 Type 2 diabetes mellitus without complications: Secondary | ICD-10-CM | POA: Diagnosis not present

## 2012-09-27 DIAGNOSIS — N183 Chronic kidney disease, stage 3 unspecified: Secondary | ICD-10-CM | POA: Diagnosis not present

## 2012-09-27 DIAGNOSIS — J449 Chronic obstructive pulmonary disease, unspecified: Secondary | ICD-10-CM | POA: Diagnosis not present

## 2012-10-03 DIAGNOSIS — D631 Anemia in chronic kidney disease: Secondary | ICD-10-CM | POA: Diagnosis not present

## 2012-10-03 DIAGNOSIS — N039 Chronic nephritic syndrome with unspecified morphologic changes: Secondary | ICD-10-CM | POA: Diagnosis not present

## 2012-10-03 DIAGNOSIS — R7989 Other specified abnormal findings of blood chemistry: Secondary | ICD-10-CM | POA: Diagnosis not present

## 2012-10-03 DIAGNOSIS — N289 Disorder of kidney and ureter, unspecified: Secondary | ICD-10-CM | POA: Diagnosis not present

## 2012-10-03 DIAGNOSIS — E875 Hyperkalemia: Secondary | ICD-10-CM | POA: Diagnosis not present

## 2012-10-04 DIAGNOSIS — E119 Type 2 diabetes mellitus without complications: Secondary | ICD-10-CM | POA: Diagnosis not present

## 2012-10-04 DIAGNOSIS — N179 Acute kidney failure, unspecified: Secondary | ICD-10-CM | POA: Diagnosis not present

## 2012-10-04 DIAGNOSIS — D631 Anemia in chronic kidney disease: Secondary | ICD-10-CM | POA: Diagnosis not present

## 2012-10-04 DIAGNOSIS — I10 Essential (primary) hypertension: Secondary | ICD-10-CM | POA: Diagnosis not present

## 2012-10-04 DIAGNOSIS — E785 Hyperlipidemia, unspecified: Secondary | ICD-10-CM | POA: Diagnosis not present

## 2012-10-04 DIAGNOSIS — N039 Chronic nephritic syndrome with unspecified morphologic changes: Secondary | ICD-10-CM | POA: Diagnosis not present

## 2012-10-06 DIAGNOSIS — I509 Heart failure, unspecified: Secondary | ICD-10-CM | POA: Diagnosis not present

## 2012-10-06 DIAGNOSIS — I129 Hypertensive chronic kidney disease with stage 1 through stage 4 chronic kidney disease, or unspecified chronic kidney disease: Secondary | ICD-10-CM | POA: Diagnosis not present

## 2012-10-06 DIAGNOSIS — D509 Iron deficiency anemia, unspecified: Secondary | ICD-10-CM | POA: Diagnosis not present

## 2012-10-06 DIAGNOSIS — J449 Chronic obstructive pulmonary disease, unspecified: Secondary | ICD-10-CM | POA: Diagnosis not present

## 2012-10-06 DIAGNOSIS — E119 Type 2 diabetes mellitus without complications: Secondary | ICD-10-CM | POA: Diagnosis not present

## 2012-10-06 DIAGNOSIS — N183 Chronic kidney disease, stage 3 unspecified: Secondary | ICD-10-CM | POA: Diagnosis not present

## 2012-10-11 DIAGNOSIS — I509 Heart failure, unspecified: Secondary | ICD-10-CM | POA: Diagnosis not present

## 2012-10-11 DIAGNOSIS — D509 Iron deficiency anemia, unspecified: Secondary | ICD-10-CM | POA: Diagnosis not present

## 2012-10-11 DIAGNOSIS — N183 Chronic kidney disease, stage 3 unspecified: Secondary | ICD-10-CM | POA: Diagnosis not present

## 2012-10-11 DIAGNOSIS — E119 Type 2 diabetes mellitus without complications: Secondary | ICD-10-CM | POA: Diagnosis not present

## 2012-10-11 DIAGNOSIS — I129 Hypertensive chronic kidney disease with stage 1 through stage 4 chronic kidney disease, or unspecified chronic kidney disease: Secondary | ICD-10-CM | POA: Diagnosis not present

## 2012-10-11 DIAGNOSIS — J449 Chronic obstructive pulmonary disease, unspecified: Secondary | ICD-10-CM | POA: Diagnosis not present

## 2012-10-13 DIAGNOSIS — N179 Acute kidney failure, unspecified: Secondary | ICD-10-CM | POA: Diagnosis not present

## 2012-10-17 DIAGNOSIS — E119 Type 2 diabetes mellitus without complications: Secondary | ICD-10-CM | POA: Diagnosis not present

## 2012-10-17 DIAGNOSIS — D509 Iron deficiency anemia, unspecified: Secondary | ICD-10-CM | POA: Diagnosis not present

## 2012-10-17 DIAGNOSIS — N183 Chronic kidney disease, stage 3 unspecified: Secondary | ICD-10-CM | POA: Diagnosis not present

## 2012-10-17 DIAGNOSIS — I509 Heart failure, unspecified: Secondary | ICD-10-CM | POA: Diagnosis not present

## 2012-10-17 DIAGNOSIS — I129 Hypertensive chronic kidney disease with stage 1 through stage 4 chronic kidney disease, or unspecified chronic kidney disease: Secondary | ICD-10-CM | POA: Diagnosis not present

## 2012-10-17 DIAGNOSIS — J449 Chronic obstructive pulmonary disease, unspecified: Secondary | ICD-10-CM | POA: Diagnosis not present

## 2012-10-19 DIAGNOSIS — D631 Anemia in chronic kidney disease: Secondary | ICD-10-CM | POA: Diagnosis not present

## 2012-10-19 DIAGNOSIS — E119 Type 2 diabetes mellitus without complications: Secondary | ICD-10-CM | POA: Diagnosis not present

## 2012-10-19 DIAGNOSIS — N189 Chronic kidney disease, unspecified: Secondary | ICD-10-CM | POA: Diagnosis not present

## 2012-10-19 DIAGNOSIS — N179 Acute kidney failure, unspecified: Secondary | ICD-10-CM | POA: Diagnosis not present

## 2012-10-19 DIAGNOSIS — N135 Crossing vessel and stricture of ureter without hydronephrosis: Secondary | ICD-10-CM | POA: Diagnosis not present

## 2012-10-19 DIAGNOSIS — I10 Essential (primary) hypertension: Secondary | ICD-10-CM | POA: Diagnosis not present

## 2012-10-19 DIAGNOSIS — E785 Hyperlipidemia, unspecified: Secondary | ICD-10-CM | POA: Diagnosis not present

## 2012-10-25 DIAGNOSIS — N183 Chronic kidney disease, stage 3 unspecified: Secondary | ICD-10-CM | POA: Diagnosis not present

## 2012-10-25 DIAGNOSIS — D509 Iron deficiency anemia, unspecified: Secondary | ICD-10-CM | POA: Diagnosis not present

## 2012-10-25 DIAGNOSIS — I509 Heart failure, unspecified: Secondary | ICD-10-CM | POA: Diagnosis not present

## 2012-10-25 DIAGNOSIS — J449 Chronic obstructive pulmonary disease, unspecified: Secondary | ICD-10-CM | POA: Diagnosis not present

## 2012-10-25 DIAGNOSIS — E119 Type 2 diabetes mellitus without complications: Secondary | ICD-10-CM | POA: Diagnosis not present

## 2012-10-25 DIAGNOSIS — I129 Hypertensive chronic kidney disease with stage 1 through stage 4 chronic kidney disease, or unspecified chronic kidney disease: Secondary | ICD-10-CM | POA: Diagnosis not present

## 2012-11-01 DIAGNOSIS — D631 Anemia in chronic kidney disease: Secondary | ICD-10-CM | POA: Diagnosis not present

## 2012-11-01 DIAGNOSIS — N189 Chronic kidney disease, unspecified: Secondary | ICD-10-CM | POA: Diagnosis not present

## 2012-11-01 DIAGNOSIS — N179 Acute kidney failure, unspecified: Secondary | ICD-10-CM | POA: Diagnosis not present

## 2012-11-02 DIAGNOSIS — E785 Hyperlipidemia, unspecified: Secondary | ICD-10-CM | POA: Diagnosis not present

## 2012-11-02 DIAGNOSIS — D631 Anemia in chronic kidney disease: Secondary | ICD-10-CM | POA: Diagnosis not present

## 2012-11-02 DIAGNOSIS — N183 Chronic kidney disease, stage 3 unspecified: Secondary | ICD-10-CM | POA: Diagnosis not present

## 2012-11-02 DIAGNOSIS — I10 Essential (primary) hypertension: Secondary | ICD-10-CM | POA: Diagnosis not present

## 2012-11-02 DIAGNOSIS — N179 Acute kidney failure, unspecified: Secondary | ICD-10-CM | POA: Diagnosis not present

## 2012-11-02 DIAGNOSIS — E119 Type 2 diabetes mellitus without complications: Secondary | ICD-10-CM | POA: Diagnosis not present

## 2012-11-03 DIAGNOSIS — N183 Chronic kidney disease, stage 3 unspecified: Secondary | ICD-10-CM | POA: Diagnosis not present

## 2012-11-03 DIAGNOSIS — J449 Chronic obstructive pulmonary disease, unspecified: Secondary | ICD-10-CM | POA: Diagnosis not present

## 2012-11-03 DIAGNOSIS — I129 Hypertensive chronic kidney disease with stage 1 through stage 4 chronic kidney disease, or unspecified chronic kidney disease: Secondary | ICD-10-CM | POA: Diagnosis not present

## 2012-11-03 DIAGNOSIS — E119 Type 2 diabetes mellitus without complications: Secondary | ICD-10-CM | POA: Diagnosis not present

## 2012-11-03 DIAGNOSIS — I509 Heart failure, unspecified: Secondary | ICD-10-CM | POA: Diagnosis not present

## 2012-11-03 DIAGNOSIS — D509 Iron deficiency anemia, unspecified: Secondary | ICD-10-CM | POA: Diagnosis not present

## 2012-11-08 DIAGNOSIS — I509 Heart failure, unspecified: Secondary | ICD-10-CM | POA: Diagnosis not present

## 2012-11-08 DIAGNOSIS — J449 Chronic obstructive pulmonary disease, unspecified: Secondary | ICD-10-CM | POA: Diagnosis not present

## 2012-11-08 DIAGNOSIS — I129 Hypertensive chronic kidney disease with stage 1 through stage 4 chronic kidney disease, or unspecified chronic kidney disease: Secondary | ICD-10-CM | POA: Diagnosis not present

## 2012-11-08 DIAGNOSIS — D509 Iron deficiency anemia, unspecified: Secondary | ICD-10-CM | POA: Diagnosis not present

## 2012-11-08 DIAGNOSIS — N183 Chronic kidney disease, stage 3 unspecified: Secondary | ICD-10-CM | POA: Diagnosis not present

## 2012-11-08 DIAGNOSIS — E119 Type 2 diabetes mellitus without complications: Secondary | ICD-10-CM | POA: Diagnosis not present

## 2012-11-28 DIAGNOSIS — M6281 Muscle weakness (generalized): Secondary | ICD-10-CM | POA: Diagnosis not present

## 2012-11-28 DIAGNOSIS — R262 Difficulty in walking, not elsewhere classified: Secondary | ICD-10-CM | POA: Diagnosis not present

## 2012-11-28 DIAGNOSIS — M5137 Other intervertebral disc degeneration, lumbosacral region: Secondary | ICD-10-CM | POA: Diagnosis not present

## 2012-11-30 DIAGNOSIS — M6281 Muscle weakness (generalized): Secondary | ICD-10-CM | POA: Diagnosis not present

## 2012-11-30 DIAGNOSIS — R262 Difficulty in walking, not elsewhere classified: Secondary | ICD-10-CM | POA: Diagnosis not present

## 2012-11-30 DIAGNOSIS — M5137 Other intervertebral disc degeneration, lumbosacral region: Secondary | ICD-10-CM | POA: Diagnosis not present

## 2012-12-05 DIAGNOSIS — M5137 Other intervertebral disc degeneration, lumbosacral region: Secondary | ICD-10-CM | POA: Diagnosis not present

## 2012-12-05 DIAGNOSIS — M6281 Muscle weakness (generalized): Secondary | ICD-10-CM | POA: Diagnosis not present

## 2012-12-05 DIAGNOSIS — R262 Difficulty in walking, not elsewhere classified: Secondary | ICD-10-CM | POA: Diagnosis not present

## 2012-12-07 DIAGNOSIS — M6281 Muscle weakness (generalized): Secondary | ICD-10-CM | POA: Diagnosis not present

## 2012-12-07 DIAGNOSIS — R262 Difficulty in walking, not elsewhere classified: Secondary | ICD-10-CM | POA: Diagnosis not present

## 2012-12-07 DIAGNOSIS — M5137 Other intervertebral disc degeneration, lumbosacral region: Secondary | ICD-10-CM | POA: Diagnosis not present

## 2012-12-08 DIAGNOSIS — M6281 Muscle weakness (generalized): Secondary | ICD-10-CM | POA: Diagnosis not present

## 2012-12-08 DIAGNOSIS — R262 Difficulty in walking, not elsewhere classified: Secondary | ICD-10-CM | POA: Diagnosis not present

## 2012-12-08 DIAGNOSIS — M5137 Other intervertebral disc degeneration, lumbosacral region: Secondary | ICD-10-CM | POA: Diagnosis not present

## 2012-12-12 DIAGNOSIS — M6281 Muscle weakness (generalized): Secondary | ICD-10-CM | POA: Diagnosis not present

## 2012-12-12 DIAGNOSIS — R262 Difficulty in walking, not elsewhere classified: Secondary | ICD-10-CM | POA: Diagnosis not present

## 2012-12-12 DIAGNOSIS — M5137 Other intervertebral disc degeneration, lumbosacral region: Secondary | ICD-10-CM | POA: Diagnosis not present

## 2012-12-13 DIAGNOSIS — R262 Difficulty in walking, not elsewhere classified: Secondary | ICD-10-CM | POA: Diagnosis not present

## 2012-12-13 DIAGNOSIS — M5137 Other intervertebral disc degeneration, lumbosacral region: Secondary | ICD-10-CM | POA: Diagnosis not present

## 2012-12-13 DIAGNOSIS — M6281 Muscle weakness (generalized): Secondary | ICD-10-CM | POA: Diagnosis not present

## 2012-12-15 DIAGNOSIS — M5137 Other intervertebral disc degeneration, lumbosacral region: Secondary | ICD-10-CM | POA: Diagnosis not present

## 2012-12-15 DIAGNOSIS — M6281 Muscle weakness (generalized): Secondary | ICD-10-CM | POA: Diagnosis not present

## 2012-12-15 DIAGNOSIS — R262 Difficulty in walking, not elsewhere classified: Secondary | ICD-10-CM | POA: Diagnosis not present

## 2012-12-18 DIAGNOSIS — R262 Difficulty in walking, not elsewhere classified: Secondary | ICD-10-CM | POA: Diagnosis not present

## 2012-12-18 DIAGNOSIS — M6281 Muscle weakness (generalized): Secondary | ICD-10-CM | POA: Diagnosis not present

## 2012-12-18 DIAGNOSIS — M5137 Other intervertebral disc degeneration, lumbosacral region: Secondary | ICD-10-CM | POA: Diagnosis not present

## 2012-12-19 DIAGNOSIS — R262 Difficulty in walking, not elsewhere classified: Secondary | ICD-10-CM | POA: Diagnosis not present

## 2012-12-19 DIAGNOSIS — M5137 Other intervertebral disc degeneration, lumbosacral region: Secondary | ICD-10-CM | POA: Diagnosis not present

## 2012-12-19 DIAGNOSIS — M6281 Muscle weakness (generalized): Secondary | ICD-10-CM | POA: Diagnosis not present

## 2012-12-21 DIAGNOSIS — M5137 Other intervertebral disc degeneration, lumbosacral region: Secondary | ICD-10-CM | POA: Diagnosis not present

## 2012-12-21 DIAGNOSIS — R262 Difficulty in walking, not elsewhere classified: Secondary | ICD-10-CM | POA: Diagnosis not present

## 2012-12-21 DIAGNOSIS — M6281 Muscle weakness (generalized): Secondary | ICD-10-CM | POA: Diagnosis not present

## 2012-12-26 DIAGNOSIS — M5137 Other intervertebral disc degeneration, lumbosacral region: Secondary | ICD-10-CM | POA: Diagnosis not present

## 2012-12-26 DIAGNOSIS — E119 Type 2 diabetes mellitus without complications: Secondary | ICD-10-CM | POA: Diagnosis not present

## 2012-12-26 DIAGNOSIS — M6281 Muscle weakness (generalized): Secondary | ICD-10-CM | POA: Diagnosis not present

## 2012-12-26 DIAGNOSIS — N183 Chronic kidney disease, stage 3 unspecified: Secondary | ICD-10-CM | POA: Diagnosis not present

## 2012-12-26 DIAGNOSIS — R262 Difficulty in walking, not elsewhere classified: Secondary | ICD-10-CM | POA: Diagnosis not present

## 2012-12-26 DIAGNOSIS — E785 Hyperlipidemia, unspecified: Secondary | ICD-10-CM | POA: Diagnosis not present

## 2012-12-26 DIAGNOSIS — D631 Anemia in chronic kidney disease: Secondary | ICD-10-CM | POA: Diagnosis not present

## 2012-12-26 DIAGNOSIS — N039 Chronic nephritic syndrome with unspecified morphologic changes: Secondary | ICD-10-CM | POA: Diagnosis not present

## 2012-12-27 DIAGNOSIS — N179 Acute kidney failure, unspecified: Secondary | ICD-10-CM | POA: Diagnosis not present

## 2012-12-27 DIAGNOSIS — R05 Cough: Secondary | ICD-10-CM | POA: Diagnosis not present

## 2012-12-27 DIAGNOSIS — R059 Cough, unspecified: Secondary | ICD-10-CM | POA: Diagnosis not present

## 2012-12-27 DIAGNOSIS — I10 Essential (primary) hypertension: Secondary | ICD-10-CM | POA: Diagnosis not present

## 2012-12-27 DIAGNOSIS — N039 Chronic nephritic syndrome with unspecified morphologic changes: Secondary | ICD-10-CM | POA: Diagnosis not present

## 2012-12-27 DIAGNOSIS — N183 Chronic kidney disease, stage 3 unspecified: Secondary | ICD-10-CM | POA: Diagnosis not present

## 2012-12-27 DIAGNOSIS — M810 Age-related osteoporosis without current pathological fracture: Secondary | ICD-10-CM | POA: Diagnosis not present

## 2012-12-27 DIAGNOSIS — D631 Anemia in chronic kidney disease: Secondary | ICD-10-CM | POA: Diagnosis not present

## 2012-12-27 DIAGNOSIS — E785 Hyperlipidemia, unspecified: Secondary | ICD-10-CM | POA: Diagnosis not present

## 2012-12-27 DIAGNOSIS — E119 Type 2 diabetes mellitus without complications: Secondary | ICD-10-CM | POA: Diagnosis not present

## 2012-12-28 DIAGNOSIS — M6281 Muscle weakness (generalized): Secondary | ICD-10-CM | POA: Diagnosis not present

## 2012-12-28 DIAGNOSIS — M5137 Other intervertebral disc degeneration, lumbosacral region: Secondary | ICD-10-CM | POA: Diagnosis not present

## 2012-12-28 DIAGNOSIS — R262 Difficulty in walking, not elsewhere classified: Secondary | ICD-10-CM | POA: Diagnosis not present

## 2012-12-29 DIAGNOSIS — M6281 Muscle weakness (generalized): Secondary | ICD-10-CM | POA: Diagnosis not present

## 2012-12-29 DIAGNOSIS — R262 Difficulty in walking, not elsewhere classified: Secondary | ICD-10-CM | POA: Diagnosis not present

## 2012-12-29 DIAGNOSIS — M5137 Other intervertebral disc degeneration, lumbosacral region: Secondary | ICD-10-CM | POA: Diagnosis not present

## 2013-01-02 DIAGNOSIS — M5137 Other intervertebral disc degeneration, lumbosacral region: Secondary | ICD-10-CM | POA: Diagnosis not present

## 2013-01-02 DIAGNOSIS — M6281 Muscle weakness (generalized): Secondary | ICD-10-CM | POA: Diagnosis not present

## 2013-01-02 DIAGNOSIS — R262 Difficulty in walking, not elsewhere classified: Secondary | ICD-10-CM | POA: Diagnosis not present

## 2013-01-03 DIAGNOSIS — N183 Chronic kidney disease, stage 3 unspecified: Secondary | ICD-10-CM | POA: Diagnosis not present

## 2013-01-03 DIAGNOSIS — R05 Cough: Secondary | ICD-10-CM | POA: Diagnosis not present

## 2013-01-03 DIAGNOSIS — R059 Cough, unspecified: Secondary | ICD-10-CM | POA: Diagnosis not present

## 2013-01-04 DIAGNOSIS — R262 Difficulty in walking, not elsewhere classified: Secondary | ICD-10-CM | POA: Diagnosis not present

## 2013-01-04 DIAGNOSIS — M6281 Muscle weakness (generalized): Secondary | ICD-10-CM | POA: Diagnosis not present

## 2013-01-04 DIAGNOSIS — M5137 Other intervertebral disc degeneration, lumbosacral region: Secondary | ICD-10-CM | POA: Diagnosis not present

## 2013-01-05 DIAGNOSIS — I25810 Atherosclerosis of coronary artery bypass graft(s) without angina pectoris: Secondary | ICD-10-CM | POA: Diagnosis not present

## 2013-01-05 DIAGNOSIS — E782 Mixed hyperlipidemia: Secondary | ICD-10-CM | POA: Diagnosis not present

## 2013-01-05 DIAGNOSIS — I10 Essential (primary) hypertension: Secondary | ICD-10-CM | POA: Diagnosis not present

## 2013-01-05 DIAGNOSIS — I4891 Unspecified atrial fibrillation: Secondary | ICD-10-CM | POA: Diagnosis not present

## 2013-01-09 DIAGNOSIS — M6281 Muscle weakness (generalized): Secondary | ICD-10-CM | POA: Diagnosis not present

## 2013-01-09 DIAGNOSIS — R262 Difficulty in walking, not elsewhere classified: Secondary | ICD-10-CM | POA: Diagnosis not present

## 2013-01-09 DIAGNOSIS — M5137 Other intervertebral disc degeneration, lumbosacral region: Secondary | ICD-10-CM | POA: Diagnosis not present

## 2013-01-11 DIAGNOSIS — N135 Crossing vessel and stricture of ureter without hydronephrosis: Secondary | ICD-10-CM | POA: Diagnosis not present

## 2013-01-11 DIAGNOSIS — N1370 Vesicoureteral-reflux, unspecified: Secondary | ICD-10-CM | POA: Diagnosis not present

## 2013-01-12 DIAGNOSIS — M5137 Other intervertebral disc degeneration, lumbosacral region: Secondary | ICD-10-CM | POA: Diagnosis not present

## 2013-01-12 DIAGNOSIS — M6281 Muscle weakness (generalized): Secondary | ICD-10-CM | POA: Diagnosis not present

## 2013-01-12 DIAGNOSIS — R262 Difficulty in walking, not elsewhere classified: Secondary | ICD-10-CM | POA: Diagnosis not present

## 2013-01-16 DIAGNOSIS — M5137 Other intervertebral disc degeneration, lumbosacral region: Secondary | ICD-10-CM | POA: Diagnosis not present

## 2013-01-16 DIAGNOSIS — R262 Difficulty in walking, not elsewhere classified: Secondary | ICD-10-CM | POA: Diagnosis not present

## 2013-01-16 DIAGNOSIS — M6281 Muscle weakness (generalized): Secondary | ICD-10-CM | POA: Diagnosis not present

## 2013-01-18 DIAGNOSIS — M5137 Other intervertebral disc degeneration, lumbosacral region: Secondary | ICD-10-CM | POA: Diagnosis not present

## 2013-01-18 DIAGNOSIS — R262 Difficulty in walking, not elsewhere classified: Secondary | ICD-10-CM | POA: Diagnosis not present

## 2013-01-18 DIAGNOSIS — M6281 Muscle weakness (generalized): Secondary | ICD-10-CM | POA: Diagnosis not present

## 2013-01-19 DIAGNOSIS — M5137 Other intervertebral disc degeneration, lumbosacral region: Secondary | ICD-10-CM | POA: Diagnosis not present

## 2013-01-19 DIAGNOSIS — M6281 Muscle weakness (generalized): Secondary | ICD-10-CM | POA: Diagnosis not present

## 2013-01-19 DIAGNOSIS — R262 Difficulty in walking, not elsewhere classified: Secondary | ICD-10-CM | POA: Diagnosis not present

## 2013-01-23 DIAGNOSIS — M5137 Other intervertebral disc degeneration, lumbosacral region: Secondary | ICD-10-CM | POA: Diagnosis not present

## 2013-01-23 DIAGNOSIS — R262 Difficulty in walking, not elsewhere classified: Secondary | ICD-10-CM | POA: Diagnosis not present

## 2013-01-23 DIAGNOSIS — M6281 Muscle weakness (generalized): Secondary | ICD-10-CM | POA: Diagnosis not present

## 2013-01-24 DIAGNOSIS — N135 Crossing vessel and stricture of ureter without hydronephrosis: Secondary | ICD-10-CM | POA: Diagnosis not present

## 2013-01-25 DIAGNOSIS — R262 Difficulty in walking, not elsewhere classified: Secondary | ICD-10-CM | POA: Diagnosis not present

## 2013-01-25 DIAGNOSIS — M5137 Other intervertebral disc degeneration, lumbosacral region: Secondary | ICD-10-CM | POA: Diagnosis not present

## 2013-01-25 DIAGNOSIS — M6281 Muscle weakness (generalized): Secondary | ICD-10-CM | POA: Diagnosis not present

## 2013-01-26 DIAGNOSIS — R262 Difficulty in walking, not elsewhere classified: Secondary | ICD-10-CM | POA: Diagnosis not present

## 2013-01-26 DIAGNOSIS — M5137 Other intervertebral disc degeneration, lumbosacral region: Secondary | ICD-10-CM | POA: Diagnosis not present

## 2013-01-26 DIAGNOSIS — M6281 Muscle weakness (generalized): Secondary | ICD-10-CM | POA: Diagnosis not present

## 2013-01-30 DIAGNOSIS — M6281 Muscle weakness (generalized): Secondary | ICD-10-CM | POA: Diagnosis not present

## 2013-01-30 DIAGNOSIS — R262 Difficulty in walking, not elsewhere classified: Secondary | ICD-10-CM | POA: Diagnosis not present

## 2013-01-30 DIAGNOSIS — M5137 Other intervertebral disc degeneration, lumbosacral region: Secondary | ICD-10-CM | POA: Diagnosis not present

## 2013-01-31 DIAGNOSIS — N183 Chronic kidney disease, stage 3 unspecified: Secondary | ICD-10-CM | POA: Diagnosis not present

## 2013-01-31 DIAGNOSIS — E119 Type 2 diabetes mellitus without complications: Secondary | ICD-10-CM | POA: Diagnosis not present

## 2013-01-31 DIAGNOSIS — E039 Hypothyroidism, unspecified: Secondary | ICD-10-CM | POA: Diagnosis not present

## 2013-01-31 DIAGNOSIS — N039 Chronic nephritic syndrome with unspecified morphologic changes: Secondary | ICD-10-CM | POA: Diagnosis not present

## 2013-01-31 DIAGNOSIS — D631 Anemia in chronic kidney disease: Secondary | ICD-10-CM | POA: Diagnosis not present

## 2013-01-31 DIAGNOSIS — E785 Hyperlipidemia, unspecified: Secondary | ICD-10-CM | POA: Diagnosis not present

## 2013-01-31 DIAGNOSIS — I10 Essential (primary) hypertension: Secondary | ICD-10-CM | POA: Diagnosis not present

## 2013-02-01 DIAGNOSIS — R262 Difficulty in walking, not elsewhere classified: Secondary | ICD-10-CM | POA: Diagnosis not present

## 2013-02-01 DIAGNOSIS — M6281 Muscle weakness (generalized): Secondary | ICD-10-CM | POA: Diagnosis not present

## 2013-02-01 DIAGNOSIS — M5137 Other intervertebral disc degeneration, lumbosacral region: Secondary | ICD-10-CM | POA: Diagnosis not present

## 2013-02-06 DIAGNOSIS — R262 Difficulty in walking, not elsewhere classified: Secondary | ICD-10-CM | POA: Diagnosis not present

## 2013-02-06 DIAGNOSIS — M5137 Other intervertebral disc degeneration, lumbosacral region: Secondary | ICD-10-CM | POA: Diagnosis not present

## 2013-02-06 DIAGNOSIS — M6281 Muscle weakness (generalized): Secondary | ICD-10-CM | POA: Diagnosis not present

## 2013-02-09 DIAGNOSIS — M5137 Other intervertebral disc degeneration, lumbosacral region: Secondary | ICD-10-CM | POA: Diagnosis not present

## 2013-02-09 DIAGNOSIS — M6281 Muscle weakness (generalized): Secondary | ICD-10-CM | POA: Diagnosis not present

## 2013-02-09 DIAGNOSIS — R262 Difficulty in walking, not elsewhere classified: Secondary | ICD-10-CM | POA: Diagnosis not present

## 2013-02-13 DIAGNOSIS — Z043 Encounter for examination and observation following other accident: Secondary | ICD-10-CM | POA: Diagnosis not present

## 2013-02-13 DIAGNOSIS — S72009A Fracture of unspecified part of neck of unspecified femur, initial encounter for closed fracture: Secondary | ICD-10-CM | POA: Diagnosis not present

## 2013-02-13 DIAGNOSIS — S0990XA Unspecified injury of head, initial encounter: Secondary | ICD-10-CM | POA: Diagnosis not present

## 2013-02-13 DIAGNOSIS — S79919A Unspecified injury of unspecified hip, initial encounter: Secondary | ICD-10-CM | POA: Diagnosis not present

## 2013-02-13 DIAGNOSIS — I517 Cardiomegaly: Secondary | ICD-10-CM | POA: Diagnosis not present

## 2013-02-14 DIAGNOSIS — D62 Acute posthemorrhagic anemia: Secondary | ICD-10-CM | POA: Diagnosis not present

## 2013-02-14 DIAGNOSIS — Z96659 Presence of unspecified artificial knee joint: Secondary | ICD-10-CM | POA: Diagnosis not present

## 2013-02-14 DIAGNOSIS — S72009A Fracture of unspecified part of neck of unspecified femur, initial encounter for closed fracture: Secondary | ICD-10-CM | POA: Diagnosis not present

## 2013-02-14 DIAGNOSIS — Z01810 Encounter for preprocedural cardiovascular examination: Secondary | ICD-10-CM | POA: Diagnosis not present

## 2013-02-14 DIAGNOSIS — M79609 Pain in unspecified limb: Secondary | ICD-10-CM | POA: Diagnosis not present

## 2013-02-14 DIAGNOSIS — S3981XA Other specified injuries of abdomen, initial encounter: Secondary | ICD-10-CM | POA: Diagnosis not present

## 2013-02-14 DIAGNOSIS — S0990XA Unspecified injury of head, initial encounter: Secondary | ICD-10-CM | POA: Diagnosis present

## 2013-02-14 DIAGNOSIS — N183 Chronic kidney disease, stage 3 unspecified: Secondary | ICD-10-CM | POA: Diagnosis present

## 2013-02-14 DIAGNOSIS — E039 Hypothyroidism, unspecified: Secondary | ICD-10-CM | POA: Diagnosis present

## 2013-02-14 DIAGNOSIS — S72033A Displaced midcervical fracture of unspecified femur, initial encounter for closed fracture: Secondary | ICD-10-CM | POA: Diagnosis not present

## 2013-02-14 DIAGNOSIS — I951 Orthostatic hypotension: Secondary | ICD-10-CM | POA: Diagnosis not present

## 2013-02-14 DIAGNOSIS — J449 Chronic obstructive pulmonary disease, unspecified: Secondary | ICD-10-CM | POA: Diagnosis present

## 2013-02-14 DIAGNOSIS — Z951 Presence of aortocoronary bypass graft: Secondary | ICD-10-CM | POA: Diagnosis not present

## 2013-02-14 DIAGNOSIS — I2510 Atherosclerotic heart disease of native coronary artery without angina pectoris: Secondary | ICD-10-CM | POA: Diagnosis present

## 2013-02-14 DIAGNOSIS — E1169 Type 2 diabetes mellitus with other specified complication: Secondary | ICD-10-CM | POA: Diagnosis present

## 2013-02-14 DIAGNOSIS — F0390 Unspecified dementia without behavioral disturbance: Secondary | ICD-10-CM | POA: Diagnosis present

## 2013-02-14 DIAGNOSIS — N179 Acute kidney failure, unspecified: Secondary | ICD-10-CM | POA: Diagnosis not present

## 2013-02-14 DIAGNOSIS — M25569 Pain in unspecified knee: Secondary | ICD-10-CM | POA: Diagnosis not present

## 2013-02-14 DIAGNOSIS — I252 Old myocardial infarction: Secondary | ICD-10-CM | POA: Diagnosis not present

## 2013-02-14 DIAGNOSIS — Q6101 Congenital single renal cyst: Secondary | ICD-10-CM | POA: Diagnosis not present

## 2013-02-14 DIAGNOSIS — I129 Hypertensive chronic kidney disease with stage 1 through stage 4 chronic kidney disease, or unspecified chronic kidney disease: Secondary | ICD-10-CM | POA: Diagnosis present

## 2013-02-17 DIAGNOSIS — F329 Major depressive disorder, single episode, unspecified: Secondary | ICD-10-CM | POA: Diagnosis present

## 2013-02-17 DIAGNOSIS — S72009A Fracture of unspecified part of neck of unspecified femur, initial encounter for closed fracture: Secondary | ICD-10-CM | POA: Diagnosis not present

## 2013-02-17 DIAGNOSIS — J45909 Unspecified asthma, uncomplicated: Secondary | ICD-10-CM | POA: Diagnosis present

## 2013-02-17 DIAGNOSIS — R269 Unspecified abnormalities of gait and mobility: Secondary | ICD-10-CM | POA: Diagnosis not present

## 2013-02-17 DIAGNOSIS — D62 Acute posthemorrhagic anemia: Secondary | ICD-10-CM | POA: Diagnosis not present

## 2013-02-17 DIAGNOSIS — I129 Hypertensive chronic kidney disease with stage 1 through stage 4 chronic kidney disease, or unspecified chronic kidney disease: Secondary | ICD-10-CM | POA: Diagnosis present

## 2013-02-17 DIAGNOSIS — F0390 Unspecified dementia without behavioral disturbance: Secondary | ICD-10-CM | POA: Diagnosis present

## 2013-02-17 DIAGNOSIS — S72009D Fracture of unspecified part of neck of unspecified femur, subsequent encounter for closed fracture with routine healing: Secondary | ICD-10-CM | POA: Diagnosis not present

## 2013-02-17 DIAGNOSIS — N189 Chronic kidney disease, unspecified: Secondary | ICD-10-CM | POA: Diagnosis present

## 2013-02-17 DIAGNOSIS — I4891 Unspecified atrial fibrillation: Secondary | ICD-10-CM | POA: Diagnosis present

## 2013-02-17 DIAGNOSIS — I10 Essential (primary) hypertension: Secondary | ICD-10-CM | POA: Diagnosis not present

## 2013-02-17 DIAGNOSIS — N390 Urinary tract infection, site not specified: Secondary | ICD-10-CM | POA: Diagnosis present

## 2013-02-17 DIAGNOSIS — B964 Proteus (mirabilis) (morganii) as the cause of diseases classified elsewhere: Secondary | ICD-10-CM | POA: Diagnosis present

## 2013-02-17 DIAGNOSIS — E46 Unspecified protein-calorie malnutrition: Secondary | ICD-10-CM | POA: Diagnosis present

## 2013-02-17 DIAGNOSIS — N318 Other neuromuscular dysfunction of bladder: Secondary | ICD-10-CM | POA: Diagnosis present

## 2013-02-17 DIAGNOSIS — I509 Heart failure, unspecified: Secondary | ICD-10-CM | POA: Diagnosis present

## 2013-02-17 DIAGNOSIS — E7800 Pure hypercholesterolemia, unspecified: Secondary | ICD-10-CM | POA: Diagnosis present

## 2013-02-17 DIAGNOSIS — Z22322 Carrier or suspected carrier of Methicillin resistant Staphylococcus aureus: Secondary | ICD-10-CM | POA: Diagnosis not present

## 2013-02-17 DIAGNOSIS — Z96659 Presence of unspecified artificial knee joint: Secondary | ICD-10-CM | POA: Diagnosis not present

## 2013-02-17 DIAGNOSIS — E785 Hyperlipidemia, unspecified: Secondary | ICD-10-CM | POA: Diagnosis present

## 2013-02-17 DIAGNOSIS — I2510 Atherosclerotic heart disease of native coronary artery without angina pectoris: Secondary | ICD-10-CM | POA: Diagnosis present

## 2013-02-17 DIAGNOSIS — E039 Hypothyroidism, unspecified: Secondary | ICD-10-CM | POA: Diagnosis present

## 2013-02-17 DIAGNOSIS — Z5189 Encounter for other specified aftercare: Secondary | ICD-10-CM | POA: Diagnosis not present

## 2013-02-17 DIAGNOSIS — J449 Chronic obstructive pulmonary disease, unspecified: Secondary | ICD-10-CM | POA: Diagnosis not present

## 2013-02-17 DIAGNOSIS — D72829 Elevated white blood cell count, unspecified: Secondary | ICD-10-CM | POA: Diagnosis not present

## 2013-02-17 DIAGNOSIS — IMO0002 Reserved for concepts with insufficient information to code with codable children: Secondary | ICD-10-CM | POA: Diagnosis not present

## 2013-02-17 DIAGNOSIS — E119 Type 2 diabetes mellitus without complications: Secondary | ICD-10-CM | POA: Diagnosis present

## 2013-02-17 DIAGNOSIS — Z951 Presence of aortocoronary bypass graft: Secondary | ICD-10-CM | POA: Diagnosis not present

## 2013-03-05 DIAGNOSIS — M25559 Pain in unspecified hip: Secondary | ICD-10-CM | POA: Diagnosis not present

## 2013-03-05 DIAGNOSIS — S72453A Displaced supracondylar fracture without intracondylar extension of lower end of unspecified femur, initial encounter for closed fracture: Secondary | ICD-10-CM | POA: Diagnosis not present

## 2013-03-05 DIAGNOSIS — R262 Difficulty in walking, not elsewhere classified: Secondary | ICD-10-CM | POA: Diagnosis not present

## 2013-03-09 DIAGNOSIS — M25559 Pain in unspecified hip: Secondary | ICD-10-CM | POA: Diagnosis not present

## 2013-03-09 DIAGNOSIS — S72453A Displaced supracondylar fracture without intracondylar extension of lower end of unspecified femur, initial encounter for closed fracture: Secondary | ICD-10-CM | POA: Diagnosis not present

## 2013-03-09 DIAGNOSIS — R262 Difficulty in walking, not elsewhere classified: Secondary | ICD-10-CM | POA: Diagnosis not present

## 2013-03-13 DIAGNOSIS — D631 Anemia in chronic kidney disease: Secondary | ICD-10-CM | POA: Diagnosis not present

## 2013-03-13 DIAGNOSIS — E039 Hypothyroidism, unspecified: Secondary | ICD-10-CM | POA: Diagnosis not present

## 2013-03-13 DIAGNOSIS — E119 Type 2 diabetes mellitus without complications: Secondary | ICD-10-CM | POA: Diagnosis not present

## 2013-03-13 DIAGNOSIS — N183 Chronic kidney disease, stage 3 unspecified: Secondary | ICD-10-CM | POA: Diagnosis not present

## 2013-03-13 DIAGNOSIS — R413 Other amnesia: Secondary | ICD-10-CM | POA: Diagnosis not present

## 2013-03-13 DIAGNOSIS — N179 Acute kidney failure, unspecified: Secondary | ICD-10-CM | POA: Diagnosis not present

## 2013-03-13 DIAGNOSIS — N039 Chronic nephritic syndrome with unspecified morphologic changes: Secondary | ICD-10-CM | POA: Diagnosis not present

## 2013-03-13 DIAGNOSIS — I10 Essential (primary) hypertension: Secondary | ICD-10-CM | POA: Diagnosis not present

## 2013-03-13 DIAGNOSIS — S72453A Displaced supracondylar fracture without intracondylar extension of lower end of unspecified femur, initial encounter for closed fracture: Secondary | ICD-10-CM | POA: Diagnosis not present

## 2013-03-13 DIAGNOSIS — M25559 Pain in unspecified hip: Secondary | ICD-10-CM | POA: Diagnosis not present

## 2013-03-13 DIAGNOSIS — E785 Hyperlipidemia, unspecified: Secondary | ICD-10-CM | POA: Diagnosis not present

## 2013-03-13 DIAGNOSIS — R262 Difficulty in walking, not elsewhere classified: Secondary | ICD-10-CM | POA: Diagnosis not present

## 2013-03-15 DIAGNOSIS — M25559 Pain in unspecified hip: Secondary | ICD-10-CM | POA: Diagnosis not present

## 2013-03-15 DIAGNOSIS — S72453A Displaced supracondylar fracture without intracondylar extension of lower end of unspecified femur, initial encounter for closed fracture: Secondary | ICD-10-CM | POA: Diagnosis not present

## 2013-03-15 DIAGNOSIS — R262 Difficulty in walking, not elsewhere classified: Secondary | ICD-10-CM | POA: Diagnosis not present

## 2013-03-19 DIAGNOSIS — N179 Acute kidney failure, unspecified: Secondary | ICD-10-CM | POA: Diagnosis not present

## 2013-03-19 DIAGNOSIS — E119 Type 2 diabetes mellitus without complications: Secondary | ICD-10-CM | POA: Diagnosis not present

## 2013-03-20 DIAGNOSIS — S72033A Displaced midcervical fracture of unspecified femur, initial encounter for closed fracture: Secondary | ICD-10-CM | POA: Diagnosis not present

## 2013-03-22 DIAGNOSIS — R262 Difficulty in walking, not elsewhere classified: Secondary | ICD-10-CM | POA: Diagnosis not present

## 2013-03-22 DIAGNOSIS — S72453A Displaced supracondylar fracture without intracondylar extension of lower end of unspecified femur, initial encounter for closed fracture: Secondary | ICD-10-CM | POA: Diagnosis not present

## 2013-03-22 DIAGNOSIS — M25559 Pain in unspecified hip: Secondary | ICD-10-CM | POA: Diagnosis not present

## 2013-03-23 DIAGNOSIS — M25559 Pain in unspecified hip: Secondary | ICD-10-CM | POA: Diagnosis not present

## 2013-03-23 DIAGNOSIS — R262 Difficulty in walking, not elsewhere classified: Secondary | ICD-10-CM | POA: Diagnosis not present

## 2013-03-23 DIAGNOSIS — S72453A Displaced supracondylar fracture without intracondylar extension of lower end of unspecified femur, initial encounter for closed fracture: Secondary | ICD-10-CM | POA: Diagnosis not present

## 2013-03-27 DIAGNOSIS — R262 Difficulty in walking, not elsewhere classified: Secondary | ICD-10-CM | POA: Diagnosis not present

## 2013-03-27 DIAGNOSIS — M25559 Pain in unspecified hip: Secondary | ICD-10-CM | POA: Diagnosis not present

## 2013-03-27 DIAGNOSIS — S72453A Displaced supracondylar fracture without intracondylar extension of lower end of unspecified femur, initial encounter for closed fracture: Secondary | ICD-10-CM | POA: Diagnosis not present

## 2013-03-29 DIAGNOSIS — S72453A Displaced supracondylar fracture without intracondylar extension of lower end of unspecified femur, initial encounter for closed fracture: Secondary | ICD-10-CM | POA: Diagnosis not present

## 2013-03-29 DIAGNOSIS — R262 Difficulty in walking, not elsewhere classified: Secondary | ICD-10-CM | POA: Diagnosis not present

## 2013-03-29 DIAGNOSIS — M25559 Pain in unspecified hip: Secondary | ICD-10-CM | POA: Diagnosis not present

## 2013-04-03 DIAGNOSIS — S72453A Displaced supracondylar fracture without intracondylar extension of lower end of unspecified femur, initial encounter for closed fracture: Secondary | ICD-10-CM | POA: Diagnosis not present

## 2013-04-03 DIAGNOSIS — M25559 Pain in unspecified hip: Secondary | ICD-10-CM | POA: Diagnosis not present

## 2013-04-03 DIAGNOSIS — R262 Difficulty in walking, not elsewhere classified: Secondary | ICD-10-CM | POA: Diagnosis not present

## 2013-04-05 DIAGNOSIS — M25559 Pain in unspecified hip: Secondary | ICD-10-CM | POA: Diagnosis not present

## 2013-04-05 DIAGNOSIS — R262 Difficulty in walking, not elsewhere classified: Secondary | ICD-10-CM | POA: Diagnosis not present

## 2013-04-05 DIAGNOSIS — S72453A Displaced supracondylar fracture without intracondylar extension of lower end of unspecified femur, initial encounter for closed fracture: Secondary | ICD-10-CM | POA: Diagnosis not present

## 2013-04-09 DIAGNOSIS — N179 Acute kidney failure, unspecified: Secondary | ICD-10-CM | POA: Diagnosis not present

## 2013-04-10 DIAGNOSIS — E119 Type 2 diabetes mellitus without complications: Secondary | ICD-10-CM | POA: Diagnosis not present

## 2013-04-10 DIAGNOSIS — M25559 Pain in unspecified hip: Secondary | ICD-10-CM | POA: Diagnosis not present

## 2013-04-10 DIAGNOSIS — E785 Hyperlipidemia, unspecified: Secondary | ICD-10-CM | POA: Diagnosis not present

## 2013-04-10 DIAGNOSIS — R262 Difficulty in walking, not elsewhere classified: Secondary | ICD-10-CM | POA: Diagnosis not present

## 2013-04-10 DIAGNOSIS — I10 Essential (primary) hypertension: Secondary | ICD-10-CM | POA: Diagnosis not present

## 2013-04-10 DIAGNOSIS — S72453A Displaced supracondylar fracture without intracondylar extension of lower end of unspecified femur, initial encounter for closed fracture: Secondary | ICD-10-CM | POA: Diagnosis not present

## 2013-04-10 DIAGNOSIS — E039 Hypothyroidism, unspecified: Secondary | ICD-10-CM | POA: Diagnosis not present

## 2013-04-10 DIAGNOSIS — N183 Chronic kidney disease, stage 3 unspecified: Secondary | ICD-10-CM | POA: Diagnosis not present

## 2013-04-12 DIAGNOSIS — R262 Difficulty in walking, not elsewhere classified: Secondary | ICD-10-CM | POA: Diagnosis not present

## 2013-04-12 DIAGNOSIS — M25559 Pain in unspecified hip: Secondary | ICD-10-CM | POA: Diagnosis not present

## 2013-04-12 DIAGNOSIS — S72453A Displaced supracondylar fracture without intracondylar extension of lower end of unspecified femur, initial encounter for closed fracture: Secondary | ICD-10-CM | POA: Diagnosis not present

## 2013-04-17 DIAGNOSIS — R262 Difficulty in walking, not elsewhere classified: Secondary | ICD-10-CM | POA: Diagnosis not present

## 2013-04-17 DIAGNOSIS — S72453A Displaced supracondylar fracture without intracondylar extension of lower end of unspecified femur, initial encounter for closed fracture: Secondary | ICD-10-CM | POA: Diagnosis not present

## 2013-04-17 DIAGNOSIS — M25559 Pain in unspecified hip: Secondary | ICD-10-CM | POA: Diagnosis not present

## 2013-04-19 DIAGNOSIS — S72453A Displaced supracondylar fracture without intracondylar extension of lower end of unspecified femur, initial encounter for closed fracture: Secondary | ICD-10-CM | POA: Diagnosis not present

## 2013-04-19 DIAGNOSIS — R262 Difficulty in walking, not elsewhere classified: Secondary | ICD-10-CM | POA: Diagnosis not present

## 2013-04-19 DIAGNOSIS — M25559 Pain in unspecified hip: Secondary | ICD-10-CM | POA: Diagnosis not present

## 2013-04-20 DIAGNOSIS — S72033A Displaced midcervical fracture of unspecified femur, initial encounter for closed fracture: Secondary | ICD-10-CM | POA: Diagnosis not present

## 2013-04-24 DIAGNOSIS — S72453A Displaced supracondylar fracture without intracondylar extension of lower end of unspecified femur, initial encounter for closed fracture: Secondary | ICD-10-CM | POA: Diagnosis not present

## 2013-04-24 DIAGNOSIS — R262 Difficulty in walking, not elsewhere classified: Secondary | ICD-10-CM | POA: Diagnosis not present

## 2013-04-24 DIAGNOSIS — M25559 Pain in unspecified hip: Secondary | ICD-10-CM | POA: Diagnosis not present

## 2013-04-26 DIAGNOSIS — R262 Difficulty in walking, not elsewhere classified: Secondary | ICD-10-CM | POA: Diagnosis not present

## 2013-04-26 DIAGNOSIS — S72453A Displaced supracondylar fracture without intracondylar extension of lower end of unspecified femur, initial encounter for closed fracture: Secondary | ICD-10-CM | POA: Diagnosis not present

## 2013-04-26 DIAGNOSIS — M25559 Pain in unspecified hip: Secondary | ICD-10-CM | POA: Diagnosis not present

## 2013-05-01 DIAGNOSIS — M25559 Pain in unspecified hip: Secondary | ICD-10-CM | POA: Diagnosis not present

## 2013-05-01 DIAGNOSIS — R262 Difficulty in walking, not elsewhere classified: Secondary | ICD-10-CM | POA: Diagnosis not present

## 2013-05-01 DIAGNOSIS — S72453A Displaced supracondylar fracture without intracondylar extension of lower end of unspecified femur, initial encounter for closed fracture: Secondary | ICD-10-CM | POA: Diagnosis not present

## 2013-05-04 DIAGNOSIS — M25559 Pain in unspecified hip: Secondary | ICD-10-CM | POA: Diagnosis not present

## 2013-05-04 DIAGNOSIS — S72453A Displaced supracondylar fracture without intracondylar extension of lower end of unspecified femur, initial encounter for closed fracture: Secondary | ICD-10-CM | POA: Diagnosis not present

## 2013-05-04 DIAGNOSIS — R262 Difficulty in walking, not elsewhere classified: Secondary | ICD-10-CM | POA: Diagnosis not present

## 2013-05-08 DIAGNOSIS — M25559 Pain in unspecified hip: Secondary | ICD-10-CM | POA: Diagnosis not present

## 2013-05-08 DIAGNOSIS — S72453A Displaced supracondylar fracture without intracondylar extension of lower end of unspecified femur, initial encounter for closed fracture: Secondary | ICD-10-CM | POA: Diagnosis not present

## 2013-05-08 DIAGNOSIS — R262 Difficulty in walking, not elsewhere classified: Secondary | ICD-10-CM | POA: Diagnosis not present

## 2013-05-10 DIAGNOSIS — M25559 Pain in unspecified hip: Secondary | ICD-10-CM | POA: Diagnosis not present

## 2013-05-10 DIAGNOSIS — R262 Difficulty in walking, not elsewhere classified: Secondary | ICD-10-CM | POA: Diagnosis not present

## 2013-05-10 DIAGNOSIS — S72453A Displaced supracondylar fracture without intracondylar extension of lower end of unspecified femur, initial encounter for closed fracture: Secondary | ICD-10-CM | POA: Diagnosis not present

## 2013-05-15 DIAGNOSIS — R262 Difficulty in walking, not elsewhere classified: Secondary | ICD-10-CM | POA: Diagnosis not present

## 2013-05-15 DIAGNOSIS — M25559 Pain in unspecified hip: Secondary | ICD-10-CM | POA: Diagnosis not present

## 2013-05-15 DIAGNOSIS — S72453A Displaced supracondylar fracture without intracondylar extension of lower end of unspecified femur, initial encounter for closed fracture: Secondary | ICD-10-CM | POA: Diagnosis not present

## 2013-05-17 DIAGNOSIS — M25559 Pain in unspecified hip: Secondary | ICD-10-CM | POA: Diagnosis not present

## 2013-05-17 DIAGNOSIS — S72453A Displaced supracondylar fracture without intracondylar extension of lower end of unspecified femur, initial encounter for closed fracture: Secondary | ICD-10-CM | POA: Diagnosis not present

## 2013-05-17 DIAGNOSIS — R262 Difficulty in walking, not elsewhere classified: Secondary | ICD-10-CM | POA: Diagnosis not present

## 2013-05-18 DIAGNOSIS — S72109A Unspecified trochanteric fracture of unspecified femur, initial encounter for closed fracture: Secondary | ICD-10-CM | POA: Diagnosis not present

## 2013-05-18 DIAGNOSIS — M25569 Pain in unspecified knee: Secondary | ICD-10-CM | POA: Diagnosis not present

## 2013-05-18 DIAGNOSIS — M25559 Pain in unspecified hip: Secondary | ICD-10-CM | POA: Diagnosis not present

## 2013-05-22 DIAGNOSIS — M169 Osteoarthritis of hip, unspecified: Secondary | ICD-10-CM | POA: Diagnosis not present

## 2013-05-22 DIAGNOSIS — N183 Chronic kidney disease, stage 3 unspecified: Secondary | ICD-10-CM | POA: Diagnosis not present

## 2013-05-22 DIAGNOSIS — I129 Hypertensive chronic kidney disease with stage 1 through stage 4 chronic kidney disease, or unspecified chronic kidney disease: Secondary | ICD-10-CM | POA: Diagnosis not present

## 2013-05-22 DIAGNOSIS — E785 Hyperlipidemia, unspecified: Secondary | ICD-10-CM | POA: Diagnosis not present

## 2013-05-23 DIAGNOSIS — D631 Anemia in chronic kidney disease: Secondary | ICD-10-CM | POA: Diagnosis not present

## 2013-05-23 DIAGNOSIS — M810 Age-related osteoporosis without current pathological fracture: Secondary | ICD-10-CM | POA: Diagnosis not present

## 2013-05-23 DIAGNOSIS — N183 Chronic kidney disease, stage 3 unspecified: Secondary | ICD-10-CM | POA: Diagnosis not present

## 2013-05-23 DIAGNOSIS — I10 Essential (primary) hypertension: Secondary | ICD-10-CM | POA: Diagnosis not present

## 2013-05-23 DIAGNOSIS — E119 Type 2 diabetes mellitus without complications: Secondary | ICD-10-CM | POA: Diagnosis not present

## 2013-05-23 DIAGNOSIS — I259 Chronic ischemic heart disease, unspecified: Secondary | ICD-10-CM | POA: Diagnosis not present

## 2013-05-23 DIAGNOSIS — E039 Hypothyroidism, unspecified: Secondary | ICD-10-CM | POA: Diagnosis not present

## 2013-05-23 DIAGNOSIS — E785 Hyperlipidemia, unspecified: Secondary | ICD-10-CM | POA: Diagnosis not present

## 2013-05-24 DIAGNOSIS — M25559 Pain in unspecified hip: Secondary | ICD-10-CM | POA: Diagnosis not present

## 2013-05-24 DIAGNOSIS — S72453A Displaced supracondylar fracture without intracondylar extension of lower end of unspecified femur, initial encounter for closed fracture: Secondary | ICD-10-CM | POA: Diagnosis not present

## 2013-05-24 DIAGNOSIS — R262 Difficulty in walking, not elsewhere classified: Secondary | ICD-10-CM | POA: Diagnosis not present

## 2013-05-28 DIAGNOSIS — S72033A Displaced midcervical fracture of unspecified femur, initial encounter for closed fracture: Secondary | ICD-10-CM | POA: Diagnosis not present

## 2013-05-28 DIAGNOSIS — M25559 Pain in unspecified hip: Secondary | ICD-10-CM | POA: Diagnosis not present

## 2013-05-28 DIAGNOSIS — M25569 Pain in unspecified knee: Secondary | ICD-10-CM | POA: Diagnosis not present

## 2013-05-29 DIAGNOSIS — S72453A Displaced supracondylar fracture without intracondylar extension of lower end of unspecified femur, initial encounter for closed fracture: Secondary | ICD-10-CM | POA: Diagnosis not present

## 2013-05-29 DIAGNOSIS — R262 Difficulty in walking, not elsewhere classified: Secondary | ICD-10-CM | POA: Diagnosis not present

## 2013-05-29 DIAGNOSIS — M25559 Pain in unspecified hip: Secondary | ICD-10-CM | POA: Diagnosis not present

## 2013-05-31 DIAGNOSIS — M25559 Pain in unspecified hip: Secondary | ICD-10-CM | POA: Diagnosis not present

## 2013-05-31 DIAGNOSIS — R262 Difficulty in walking, not elsewhere classified: Secondary | ICD-10-CM | POA: Diagnosis not present

## 2013-05-31 DIAGNOSIS — S72453A Displaced supracondylar fracture without intracondylar extension of lower end of unspecified femur, initial encounter for closed fracture: Secondary | ICD-10-CM | POA: Diagnosis not present

## 2013-06-04 DIAGNOSIS — E119 Type 2 diabetes mellitus without complications: Secondary | ICD-10-CM | POA: Diagnosis not present

## 2013-06-04 DIAGNOSIS — R0609 Other forms of dyspnea: Secondary | ICD-10-CM | POA: Diagnosis not present

## 2013-06-04 DIAGNOSIS — I2510 Atherosclerotic heart disease of native coronary artery without angina pectoris: Secondary | ICD-10-CM | POA: Diagnosis not present

## 2013-06-04 DIAGNOSIS — R0989 Other specified symptoms and signs involving the circulatory and respiratory systems: Secondary | ICD-10-CM | POA: Diagnosis not present

## 2013-06-04 DIAGNOSIS — I6529 Occlusion and stenosis of unspecified carotid artery: Secondary | ICD-10-CM | POA: Diagnosis not present

## 2013-06-05 DIAGNOSIS — I4891 Unspecified atrial fibrillation: Secondary | ICD-10-CM | POA: Diagnosis not present

## 2013-06-05 DIAGNOSIS — R262 Difficulty in walking, not elsewhere classified: Secondary | ICD-10-CM | POA: Diagnosis not present

## 2013-06-05 DIAGNOSIS — I2510 Atherosclerotic heart disease of native coronary artery without angina pectoris: Secondary | ICD-10-CM | POA: Diagnosis not present

## 2013-06-05 DIAGNOSIS — E119 Type 2 diabetes mellitus without complications: Secondary | ICD-10-CM | POA: Diagnosis not present

## 2013-06-05 DIAGNOSIS — S72453A Displaced supracondylar fracture without intracondylar extension of lower end of unspecified femur, initial encounter for closed fracture: Secondary | ICD-10-CM | POA: Diagnosis not present

## 2013-06-05 DIAGNOSIS — M25559 Pain in unspecified hip: Secondary | ICD-10-CM | POA: Diagnosis not present

## 2013-06-05 DIAGNOSIS — R946 Abnormal results of thyroid function studies: Secondary | ICD-10-CM | POA: Diagnosis not present

## 2013-06-08 DIAGNOSIS — J479 Bronchiectasis, uncomplicated: Secondary | ICD-10-CM | POA: Diagnosis not present

## 2013-06-08 DIAGNOSIS — G4733 Obstructive sleep apnea (adult) (pediatric): Secondary | ICD-10-CM | POA: Diagnosis not present

## 2013-06-08 DIAGNOSIS — J449 Chronic obstructive pulmonary disease, unspecified: Secondary | ICD-10-CM | POA: Diagnosis not present

## 2013-06-08 DIAGNOSIS — R0602 Shortness of breath: Secondary | ICD-10-CM | POA: Diagnosis not present

## 2013-06-12 DIAGNOSIS — M25559 Pain in unspecified hip: Secondary | ICD-10-CM | POA: Diagnosis not present

## 2013-06-12 DIAGNOSIS — R262 Difficulty in walking, not elsewhere classified: Secondary | ICD-10-CM | POA: Diagnosis not present

## 2013-06-12 DIAGNOSIS — S72453A Displaced supracondylar fracture without intracondylar extension of lower end of unspecified femur, initial encounter for closed fracture: Secondary | ICD-10-CM | POA: Diagnosis not present

## 2013-06-14 DIAGNOSIS — I6529 Occlusion and stenosis of unspecified carotid artery: Secondary | ICD-10-CM | POA: Diagnosis not present

## 2013-06-14 DIAGNOSIS — I25810 Atherosclerosis of coronary artery bypass graft(s) without angina pectoris: Secondary | ICD-10-CM | POA: Diagnosis not present

## 2013-06-14 DIAGNOSIS — E782 Mixed hyperlipidemia: Secondary | ICD-10-CM | POA: Diagnosis not present

## 2013-06-14 DIAGNOSIS — I10 Essential (primary) hypertension: Secondary | ICD-10-CM | POA: Diagnosis not present

## 2013-06-15 DIAGNOSIS — R262 Difficulty in walking, not elsewhere classified: Secondary | ICD-10-CM | POA: Diagnosis not present

## 2013-06-15 DIAGNOSIS — S72453A Displaced supracondylar fracture without intracondylar extension of lower end of unspecified femur, initial encounter for closed fracture: Secondary | ICD-10-CM | POA: Diagnosis not present

## 2013-06-15 DIAGNOSIS — M25559 Pain in unspecified hip: Secondary | ICD-10-CM | POA: Diagnosis not present

## 2013-06-19 DIAGNOSIS — M25559 Pain in unspecified hip: Secondary | ICD-10-CM | POA: Diagnosis not present

## 2013-06-19 DIAGNOSIS — R262 Difficulty in walking, not elsewhere classified: Secondary | ICD-10-CM | POA: Diagnosis not present

## 2013-06-19 DIAGNOSIS — S72453A Displaced supracondylar fracture without intracondylar extension of lower end of unspecified femur, initial encounter for closed fracture: Secondary | ICD-10-CM | POA: Diagnosis not present

## 2013-06-20 DIAGNOSIS — M1610 Unilateral primary osteoarthritis, unspecified hip: Secondary | ICD-10-CM | POA: Diagnosis not present

## 2013-06-20 DIAGNOSIS — Z01811 Encounter for preprocedural respiratory examination: Secondary | ICD-10-CM | POA: Diagnosis not present

## 2013-06-20 DIAGNOSIS — M25559 Pain in unspecified hip: Secondary | ICD-10-CM | POA: Diagnosis not present

## 2013-06-20 DIAGNOSIS — S72033A Displaced midcervical fracture of unspecified femur, initial encounter for closed fracture: Secondary | ICD-10-CM | POA: Diagnosis not present

## 2013-06-21 DIAGNOSIS — I10 Essential (primary) hypertension: Secondary | ICD-10-CM | POA: Diagnosis not present

## 2013-06-21 DIAGNOSIS — I6529 Occlusion and stenosis of unspecified carotid artery: Secondary | ICD-10-CM | POA: Diagnosis not present

## 2013-06-21 DIAGNOSIS — I25810 Atherosclerosis of coronary artery bypass graft(s) without angina pectoris: Secondary | ICD-10-CM | POA: Diagnosis not present

## 2013-06-21 DIAGNOSIS — E119 Type 2 diabetes mellitus without complications: Secondary | ICD-10-CM | POA: Diagnosis not present

## 2013-06-22 DIAGNOSIS — R9431 Abnormal electrocardiogram [ECG] [EKG]: Secondary | ICD-10-CM | POA: Diagnosis not present

## 2013-06-26 DIAGNOSIS — Z96649 Presence of unspecified artificial hip joint: Secondary | ICD-10-CM | POA: Diagnosis not present

## 2013-06-26 DIAGNOSIS — Z471 Aftercare following joint replacement surgery: Secondary | ICD-10-CM | POA: Diagnosis not present

## 2013-06-26 DIAGNOSIS — S72033A Displaced midcervical fracture of unspecified femur, initial encounter for closed fracture: Secondary | ICD-10-CM | POA: Diagnosis not present

## 2013-06-26 DIAGNOSIS — M6250 Muscle wasting and atrophy, not elsewhere classified, unspecified site: Secondary | ICD-10-CM | POA: Diagnosis not present

## 2013-06-26 DIAGNOSIS — M169 Osteoarthritis of hip, unspecified: Secondary | ICD-10-CM | POA: Diagnosis not present

## 2013-06-26 DIAGNOSIS — S7290XA Unspecified fracture of unspecified femur, initial encounter for closed fracture: Secondary | ICD-10-CM | POA: Diagnosis not present

## 2013-06-26 DIAGNOSIS — R279 Unspecified lack of coordination: Secondary | ICD-10-CM | POA: Diagnosis not present

## 2013-06-26 DIAGNOSIS — F329 Major depressive disorder, single episode, unspecified: Secondary | ICD-10-CM | POA: Diagnosis present

## 2013-06-26 DIAGNOSIS — J441 Chronic obstructive pulmonary disease with (acute) exacerbation: Secondary | ICD-10-CM | POA: Diagnosis not present

## 2013-06-26 DIAGNOSIS — M1610 Unilateral primary osteoarthritis, unspecified hip: Secondary | ICD-10-CM | POA: Diagnosis not present

## 2013-06-26 DIAGNOSIS — I10 Essential (primary) hypertension: Secondary | ICD-10-CM | POA: Diagnosis not present

## 2013-06-26 DIAGNOSIS — IMO0002 Reserved for concepts with insufficient information to code with codable children: Secondary | ICD-10-CM | POA: Diagnosis present

## 2013-06-26 DIAGNOSIS — E119 Type 2 diabetes mellitus without complications: Secondary | ICD-10-CM | POA: Diagnosis present

## 2013-06-26 DIAGNOSIS — R1312 Dysphagia, oropharyngeal phase: Secondary | ICD-10-CM | POA: Diagnosis not present

## 2013-06-26 DIAGNOSIS — D649 Anemia, unspecified: Secondary | ICD-10-CM | POA: Diagnosis present

## 2013-06-26 DIAGNOSIS — R404 Transient alteration of awareness: Secondary | ICD-10-CM | POA: Diagnosis not present

## 2013-06-26 DIAGNOSIS — M25559 Pain in unspecified hip: Secondary | ICD-10-CM | POA: Diagnosis not present

## 2013-06-26 DIAGNOSIS — J984 Other disorders of lung: Secondary | ICD-10-CM | POA: Diagnosis not present

## 2013-06-26 DIAGNOSIS — I69920 Aphasia following unspecified cerebrovascular disease: Secondary | ICD-10-CM | POA: Diagnosis not present

## 2013-06-26 DIAGNOSIS — I129 Hypertensive chronic kidney disease with stage 1 through stage 4 chronic kidney disease, or unspecified chronic kidney disease: Secondary | ICD-10-CM | POA: Diagnosis present

## 2013-06-26 DIAGNOSIS — I2510 Atherosclerotic heart disease of native coronary artery without angina pectoris: Secondary | ICD-10-CM | POA: Diagnosis present

## 2013-06-26 DIAGNOSIS — N183 Chronic kidney disease, stage 3 unspecified: Secondary | ICD-10-CM | POA: Diagnosis present

## 2013-06-26 DIAGNOSIS — Z951 Presence of aortocoronary bypass graft: Secondary | ICD-10-CM | POA: Diagnosis not present

## 2013-06-26 DIAGNOSIS — R269 Unspecified abnormalities of gait and mobility: Secondary | ICD-10-CM | POA: Diagnosis not present

## 2013-06-26 DIAGNOSIS — R41841 Cognitive communication deficit: Secondary | ICD-10-CM | POA: Diagnosis not present

## 2013-06-26 DIAGNOSIS — R928 Other abnormal and inconclusive findings on diagnostic imaging of breast: Secondary | ICD-10-CM | POA: Diagnosis not present

## 2013-06-26 DIAGNOSIS — R0902 Hypoxemia: Secondary | ICD-10-CM | POA: Diagnosis not present

## 2013-06-26 DIAGNOSIS — IMO0001 Reserved for inherently not codable concepts without codable children: Secondary | ICD-10-CM | POA: Diagnosis not present

## 2013-06-26 DIAGNOSIS — J449 Chronic obstructive pulmonary disease, unspecified: Secondary | ICD-10-CM | POA: Diagnosis present

## 2013-07-02 DIAGNOSIS — S72033A Displaced midcervical fracture of unspecified femur, initial encounter for closed fracture: Secondary | ICD-10-CM | POA: Diagnosis not present

## 2013-07-02 DIAGNOSIS — N289 Disorder of kidney and ureter, unspecified: Secondary | ICD-10-CM | POA: Diagnosis not present

## 2013-07-02 DIAGNOSIS — M069 Rheumatoid arthritis, unspecified: Secondary | ICD-10-CM | POA: Diagnosis not present

## 2013-07-02 DIAGNOSIS — E039 Hypothyroidism, unspecified: Secondary | ICD-10-CM | POA: Diagnosis not present

## 2013-07-02 DIAGNOSIS — Z79899 Other long term (current) drug therapy: Secondary | ICD-10-CM | POA: Diagnosis not present

## 2013-07-02 DIAGNOSIS — I69928 Other speech and language deficits following unspecified cerebrovascular disease: Secondary | ICD-10-CM | POA: Diagnosis not present

## 2013-07-02 DIAGNOSIS — M1610 Unilateral primary osteoarthritis, unspecified hip: Secondary | ICD-10-CM | POA: Diagnosis not present

## 2013-07-02 DIAGNOSIS — R279 Unspecified lack of coordination: Secondary | ICD-10-CM | POA: Diagnosis not present

## 2013-07-02 DIAGNOSIS — I10 Essential (primary) hypertension: Secondary | ICD-10-CM | POA: Diagnosis not present

## 2013-07-02 DIAGNOSIS — Z23 Encounter for immunization: Secondary | ICD-10-CM | POA: Diagnosis not present

## 2013-07-02 DIAGNOSIS — M6250 Muscle wasting and atrophy, not elsewhere classified, unspecified site: Secondary | ICD-10-CM | POA: Diagnosis not present

## 2013-07-02 DIAGNOSIS — Z87442 Personal history of urinary calculi: Secondary | ICD-10-CM | POA: Diagnosis not present

## 2013-07-02 DIAGNOSIS — M810 Age-related osteoporosis without current pathological fracture: Secondary | ICD-10-CM | POA: Diagnosis not present

## 2013-07-02 DIAGNOSIS — R269 Unspecified abnormalities of gait and mobility: Secondary | ICD-10-CM | POA: Diagnosis not present

## 2013-07-02 DIAGNOSIS — D538 Other specified nutritional anemias: Secondary | ICD-10-CM | POA: Diagnosis not present

## 2013-07-02 DIAGNOSIS — I129 Hypertensive chronic kidney disease with stage 1 through stage 4 chronic kidney disease, or unspecified chronic kidney disease: Secondary | ICD-10-CM | POA: Diagnosis not present

## 2013-07-02 DIAGNOSIS — E139 Other specified diabetes mellitus without complications: Secondary | ICD-10-CM | POA: Diagnosis not present

## 2013-07-02 DIAGNOSIS — Z96649 Presence of unspecified artificial hip joint: Secondary | ICD-10-CM | POA: Diagnosis not present

## 2013-07-02 DIAGNOSIS — E785 Hyperlipidemia, unspecified: Secondary | ICD-10-CM | POA: Diagnosis not present

## 2013-07-02 DIAGNOSIS — S72019A Unspecified intracapsular fracture of unspecified femur, initial encounter for closed fracture: Secondary | ICD-10-CM | POA: Diagnosis not present

## 2013-07-02 DIAGNOSIS — R41841 Cognitive communication deficit: Secondary | ICD-10-CM | POA: Diagnosis not present

## 2013-07-02 DIAGNOSIS — R1312 Dysphagia, oropharyngeal phase: Secondary | ICD-10-CM | POA: Diagnosis not present

## 2013-07-02 DIAGNOSIS — I69920 Aphasia following unspecified cerebrovascular disease: Secondary | ICD-10-CM | POA: Diagnosis not present

## 2013-07-02 DIAGNOSIS — Z7982 Long term (current) use of aspirin: Secondary | ICD-10-CM | POA: Diagnosis not present

## 2013-07-02 DIAGNOSIS — IMO0001 Reserved for inherently not codable concepts without codable children: Secondary | ICD-10-CM | POA: Diagnosis not present

## 2013-07-02 DIAGNOSIS — D631 Anemia in chronic kidney disease: Secondary | ICD-10-CM | POA: Diagnosis not present

## 2013-07-02 DIAGNOSIS — E119 Type 2 diabetes mellitus without complications: Secondary | ICD-10-CM | POA: Diagnosis not present

## 2013-07-02 DIAGNOSIS — J441 Chronic obstructive pulmonary disease with (acute) exacerbation: Secondary | ICD-10-CM | POA: Diagnosis not present

## 2013-07-02 DIAGNOSIS — N183 Chronic kidney disease, stage 3 unspecified: Secondary | ICD-10-CM | POA: Diagnosis not present

## 2013-07-03 DIAGNOSIS — IMO0001 Reserved for inherently not codable concepts without codable children: Secondary | ICD-10-CM | POA: Diagnosis not present

## 2013-07-03 DIAGNOSIS — J441 Chronic obstructive pulmonary disease with (acute) exacerbation: Secondary | ICD-10-CM | POA: Diagnosis not present

## 2013-07-03 DIAGNOSIS — I69928 Other speech and language deficits following unspecified cerebrovascular disease: Secondary | ICD-10-CM | POA: Diagnosis not present

## 2013-07-03 DIAGNOSIS — S72019A Unspecified intracapsular fracture of unspecified femur, initial encounter for closed fracture: Secondary | ICD-10-CM | POA: Diagnosis not present

## 2013-07-09 DIAGNOSIS — Z96649 Presence of unspecified artificial hip joint: Secondary | ICD-10-CM | POA: Diagnosis not present

## 2013-07-09 DIAGNOSIS — M1610 Unilateral primary osteoarthritis, unspecified hip: Secondary | ICD-10-CM | POA: Diagnosis not present

## 2013-07-10 DIAGNOSIS — I69928 Other speech and language deficits following unspecified cerebrovascular disease: Secondary | ICD-10-CM | POA: Diagnosis not present

## 2013-07-10 DIAGNOSIS — IMO0001 Reserved for inherently not codable concepts without codable children: Secondary | ICD-10-CM | POA: Diagnosis not present

## 2013-07-10 DIAGNOSIS — S72019A Unspecified intracapsular fracture of unspecified femur, initial encounter for closed fracture: Secondary | ICD-10-CM | POA: Diagnosis not present

## 2013-07-10 DIAGNOSIS — J441 Chronic obstructive pulmonary disease with (acute) exacerbation: Secondary | ICD-10-CM | POA: Diagnosis not present

## 2013-07-17 DIAGNOSIS — IMO0001 Reserved for inherently not codable concepts without codable children: Secondary | ICD-10-CM | POA: Diagnosis not present

## 2013-07-17 DIAGNOSIS — I69928 Other speech and language deficits following unspecified cerebrovascular disease: Secondary | ICD-10-CM | POA: Diagnosis not present

## 2013-07-17 DIAGNOSIS — S72019A Unspecified intracapsular fracture of unspecified femur, initial encounter for closed fracture: Secondary | ICD-10-CM | POA: Diagnosis not present

## 2013-07-17 DIAGNOSIS — J441 Chronic obstructive pulmonary disease with (acute) exacerbation: Secondary | ICD-10-CM | POA: Diagnosis not present

## 2013-07-23 DIAGNOSIS — I10 Essential (primary) hypertension: Secondary | ICD-10-CM | POA: Diagnosis not present

## 2013-07-24 DIAGNOSIS — S72019A Unspecified intracapsular fracture of unspecified femur, initial encounter for closed fracture: Secondary | ICD-10-CM | POA: Diagnosis not present

## 2013-07-24 DIAGNOSIS — IMO0001 Reserved for inherently not codable concepts without codable children: Secondary | ICD-10-CM | POA: Diagnosis not present

## 2013-07-24 DIAGNOSIS — J441 Chronic obstructive pulmonary disease with (acute) exacerbation: Secondary | ICD-10-CM | POA: Diagnosis not present

## 2013-07-24 DIAGNOSIS — I69928 Other speech and language deficits following unspecified cerebrovascular disease: Secondary | ICD-10-CM | POA: Diagnosis not present

## 2013-07-25 DIAGNOSIS — M810 Age-related osteoporosis without current pathological fracture: Secondary | ICD-10-CM | POA: Diagnosis not present

## 2013-07-25 DIAGNOSIS — E039 Hypothyroidism, unspecified: Secondary | ICD-10-CM | POA: Diagnosis not present

## 2013-07-25 DIAGNOSIS — N183 Chronic kidney disease, stage 3 unspecified: Secondary | ICD-10-CM | POA: Diagnosis not present

## 2013-07-25 DIAGNOSIS — I10 Essential (primary) hypertension: Secondary | ICD-10-CM | POA: Diagnosis not present

## 2013-07-25 DIAGNOSIS — E785 Hyperlipidemia, unspecified: Secondary | ICD-10-CM | POA: Diagnosis not present

## 2013-07-25 DIAGNOSIS — E119 Type 2 diabetes mellitus without complications: Secondary | ICD-10-CM | POA: Diagnosis not present

## 2013-07-25 DIAGNOSIS — D631 Anemia in chronic kidney disease: Secondary | ICD-10-CM | POA: Diagnosis not present

## 2013-07-26 DIAGNOSIS — D631 Anemia in chronic kidney disease: Secondary | ICD-10-CM | POA: Diagnosis not present

## 2013-07-30 DIAGNOSIS — D631 Anemia in chronic kidney disease: Secondary | ICD-10-CM | POA: Diagnosis not present

## 2013-07-30 DIAGNOSIS — D538 Other specified nutritional anemias: Secondary | ICD-10-CM | POA: Diagnosis not present

## 2013-08-01 DIAGNOSIS — J441 Chronic obstructive pulmonary disease with (acute) exacerbation: Secondary | ICD-10-CM | POA: Diagnosis not present

## 2013-08-01 DIAGNOSIS — N183 Chronic kidney disease, stage 3 unspecified: Secondary | ICD-10-CM | POA: Diagnosis not present

## 2013-08-01 DIAGNOSIS — R269 Unspecified abnormalities of gait and mobility: Secondary | ICD-10-CM | POA: Diagnosis not present

## 2013-08-01 DIAGNOSIS — S72033A Displaced midcervical fracture of unspecified femur, initial encounter for closed fracture: Secondary | ICD-10-CM | POA: Diagnosis not present

## 2013-08-01 DIAGNOSIS — M1610 Unilateral primary osteoarthritis, unspecified hip: Secondary | ICD-10-CM | POA: Diagnosis not present

## 2013-08-01 DIAGNOSIS — N289 Disorder of kidney and ureter, unspecified: Secondary | ICD-10-CM | POA: Diagnosis not present

## 2013-08-01 DIAGNOSIS — D631 Anemia in chronic kidney disease: Secondary | ICD-10-CM | POA: Diagnosis not present

## 2013-08-02 DIAGNOSIS — M810 Age-related osteoporosis without current pathological fracture: Secondary | ICD-10-CM | POA: Diagnosis not present

## 2013-08-02 DIAGNOSIS — N183 Chronic kidney disease, stage 3 unspecified: Secondary | ICD-10-CM | POA: Diagnosis not present

## 2013-08-02 DIAGNOSIS — E119 Type 2 diabetes mellitus without complications: Secondary | ICD-10-CM | POA: Diagnosis not present

## 2013-08-02 DIAGNOSIS — E785 Hyperlipidemia, unspecified: Secondary | ICD-10-CM | POA: Diagnosis not present

## 2013-08-02 DIAGNOSIS — E039 Hypothyroidism, unspecified: Secondary | ICD-10-CM | POA: Diagnosis not present

## 2013-08-02 DIAGNOSIS — I129 Hypertensive chronic kidney disease with stage 1 through stage 4 chronic kidney disease, or unspecified chronic kidney disease: Secondary | ICD-10-CM | POA: Diagnosis not present

## 2013-08-03 DIAGNOSIS — R5381 Other malaise: Secondary | ICD-10-CM | POA: Diagnosis not present

## 2013-08-03 DIAGNOSIS — I10 Essential (primary) hypertension: Secondary | ICD-10-CM | POA: Diagnosis not present

## 2013-08-03 DIAGNOSIS — Z96649 Presence of unspecified artificial hip joint: Secondary | ICD-10-CM | POA: Diagnosis not present

## 2013-08-03 DIAGNOSIS — R1310 Dysphagia, unspecified: Secondary | ICD-10-CM | POA: Diagnosis not present

## 2013-08-03 DIAGNOSIS — E119 Type 2 diabetes mellitus without complications: Secondary | ICD-10-CM | POA: Diagnosis not present

## 2013-08-06 DIAGNOSIS — I10 Essential (primary) hypertension: Secondary | ICD-10-CM | POA: Diagnosis present

## 2013-08-06 DIAGNOSIS — R5381 Other malaise: Secondary | ICD-10-CM | POA: Diagnosis not present

## 2013-08-06 DIAGNOSIS — Z96649 Presence of unspecified artificial hip joint: Secondary | ICD-10-CM | POA: Diagnosis not present

## 2013-08-06 DIAGNOSIS — F0390 Unspecified dementia without behavioral disturbance: Secondary | ICD-10-CM | POA: Diagnosis present

## 2013-08-06 DIAGNOSIS — J449 Chronic obstructive pulmonary disease, unspecified: Secondary | ICD-10-CM | POA: Diagnosis present

## 2013-08-06 DIAGNOSIS — IMO0001 Reserved for inherently not codable concepts without codable children: Secondary | ICD-10-CM | POA: Diagnosis not present

## 2013-08-06 DIAGNOSIS — R5383 Other fatigue: Secondary | ICD-10-CM | POA: Diagnosis not present

## 2013-08-06 DIAGNOSIS — R1310 Dysphagia, unspecified: Secondary | ICD-10-CM | POA: Diagnosis not present

## 2013-08-06 DIAGNOSIS — E119 Type 2 diabetes mellitus without complications: Secondary | ICD-10-CM | POA: Diagnosis present

## 2013-09-07 DIAGNOSIS — N3941 Urge incontinence: Secondary | ICD-10-CM | POA: Diagnosis not present

## 2013-09-07 DIAGNOSIS — N390 Urinary tract infection, site not specified: Secondary | ICD-10-CM | POA: Diagnosis not present

## 2013-09-07 DIAGNOSIS — N135 Crossing vessel and stricture of ureter without hydronephrosis: Secondary | ICD-10-CM | POA: Diagnosis not present

## 2013-09-12 DIAGNOSIS — I129 Hypertensive chronic kidney disease with stage 1 through stage 4 chronic kidney disease, or unspecified chronic kidney disease: Secondary | ICD-10-CM | POA: Diagnosis present

## 2013-09-12 DIAGNOSIS — R509 Fever, unspecified: Secondary | ICD-10-CM | POA: Diagnosis not present

## 2013-09-12 DIAGNOSIS — I4510 Unspecified right bundle-branch block: Secondary | ICD-10-CM | POA: Diagnosis not present

## 2013-09-12 DIAGNOSIS — E039 Hypothyroidism, unspecified: Secondary | ICD-10-CM | POA: Diagnosis present

## 2013-09-12 DIAGNOSIS — Z951 Presence of aortocoronary bypass graft: Secondary | ICD-10-CM | POA: Diagnosis not present

## 2013-09-12 DIAGNOSIS — Q248 Other specified congenital malformations of heart: Secondary | ICD-10-CM | POA: Diagnosis not present

## 2013-09-12 DIAGNOSIS — G9349 Other encephalopathy: Secondary | ICD-10-CM | POA: Diagnosis not present

## 2013-09-12 DIAGNOSIS — Z86718 Personal history of other venous thrombosis and embolism: Secondary | ICD-10-CM | POA: Diagnosis not present

## 2013-09-12 DIAGNOSIS — R5381 Other malaise: Secondary | ICD-10-CM | POA: Diagnosis not present

## 2013-09-12 DIAGNOSIS — Z7982 Long term (current) use of aspirin: Secondary | ICD-10-CM | POA: Diagnosis not present

## 2013-09-12 DIAGNOSIS — J329 Chronic sinusitis, unspecified: Secondary | ICD-10-CM | POA: Diagnosis not present

## 2013-09-12 DIAGNOSIS — R9431 Abnormal electrocardiogram [ECG] [EKG]: Secondary | ICD-10-CM | POA: Diagnosis not present

## 2013-09-12 DIAGNOSIS — N390 Urinary tract infection, site not specified: Secondary | ICD-10-CM | POA: Diagnosis not present

## 2013-09-12 DIAGNOSIS — Z90710 Acquired absence of both cervix and uterus: Secondary | ICD-10-CM | POA: Diagnosis not present

## 2013-09-12 DIAGNOSIS — Z96649 Presence of unspecified artificial hip joint: Secondary | ICD-10-CM | POA: Diagnosis not present

## 2013-09-12 DIAGNOSIS — R4182 Altered mental status, unspecified: Secondary | ICD-10-CM | POA: Diagnosis not present

## 2013-09-12 DIAGNOSIS — E119 Type 2 diabetes mellitus without complications: Secondary | ICD-10-CM | POA: Diagnosis present

## 2013-09-12 DIAGNOSIS — D649 Anemia, unspecified: Secondary | ICD-10-CM | POA: Diagnosis not present

## 2013-09-12 DIAGNOSIS — Z96659 Presence of unspecified artificial knee joint: Secondary | ICD-10-CM | POA: Diagnosis not present

## 2013-09-12 DIAGNOSIS — G319 Degenerative disease of nervous system, unspecified: Secondary | ICD-10-CM | POA: Diagnosis not present

## 2013-09-12 DIAGNOSIS — E785 Hyperlipidemia, unspecified: Secondary | ICD-10-CM | POA: Diagnosis present

## 2013-09-12 DIAGNOSIS — N179 Acute kidney failure, unspecified: Secondary | ICD-10-CM | POA: Diagnosis present

## 2013-09-12 DIAGNOSIS — J449 Chronic obstructive pulmonary disease, unspecified: Secondary | ICD-10-CM | POA: Diagnosis not present

## 2013-09-12 DIAGNOSIS — F29 Unspecified psychosis not due to a substance or known physiological condition: Secondary | ICD-10-CM | POA: Diagnosis not present

## 2013-09-12 DIAGNOSIS — I509 Heart failure, unspecified: Secondary | ICD-10-CM | POA: Diagnosis not present

## 2013-09-12 DIAGNOSIS — Z9089 Acquired absence of other organs: Secondary | ICD-10-CM | POA: Diagnosis not present

## 2013-09-12 DIAGNOSIS — N189 Chronic kidney disease, unspecified: Secondary | ICD-10-CM | POA: Diagnosis present

## 2013-09-18 DIAGNOSIS — D631 Anemia in chronic kidney disease: Secondary | ICD-10-CM | POA: Diagnosis not present

## 2013-09-18 DIAGNOSIS — E039 Hypothyroidism, unspecified: Secondary | ICD-10-CM | POA: Diagnosis not present

## 2013-09-18 DIAGNOSIS — M810 Age-related osteoporosis without current pathological fracture: Secondary | ICD-10-CM | POA: Diagnosis not present

## 2013-09-18 DIAGNOSIS — E119 Type 2 diabetes mellitus without complications: Secondary | ICD-10-CM | POA: Diagnosis not present

## 2013-09-18 DIAGNOSIS — N183 Chronic kidney disease, stage 3 unspecified: Secondary | ICD-10-CM | POA: Diagnosis not present

## 2013-09-18 DIAGNOSIS — I10 Essential (primary) hypertension: Secondary | ICD-10-CM | POA: Diagnosis not present

## 2013-09-18 DIAGNOSIS — E785 Hyperlipidemia, unspecified: Secondary | ICD-10-CM | POA: Diagnosis not present

## 2013-09-18 DIAGNOSIS — N039 Chronic nephritic syndrome with unspecified morphologic changes: Secondary | ICD-10-CM | POA: Diagnosis not present

## 2013-09-28 DIAGNOSIS — F3289 Other specified depressive episodes: Secondary | ICD-10-CM | POA: Diagnosis present

## 2013-09-28 DIAGNOSIS — E161 Other hypoglycemia: Secondary | ICD-10-CM | POA: Diagnosis not present

## 2013-09-28 DIAGNOSIS — G92 Toxic encephalopathy: Secondary | ICD-10-CM | POA: Diagnosis present

## 2013-09-28 DIAGNOSIS — G9341 Metabolic encephalopathy: Secondary | ICD-10-CM | POA: Diagnosis not present

## 2013-09-28 DIAGNOSIS — I498 Other specified cardiac arrhythmias: Secondary | ICD-10-CM | POA: Diagnosis not present

## 2013-09-28 DIAGNOSIS — Z951 Presence of aortocoronary bypass graft: Secondary | ICD-10-CM | POA: Diagnosis not present

## 2013-09-28 DIAGNOSIS — I2510 Atherosclerotic heart disease of native coronary artery without angina pectoris: Secondary | ICD-10-CM | POA: Diagnosis not present

## 2013-09-28 DIAGNOSIS — G3183 Dementia with Lewy bodies: Secondary | ICD-10-CM | POA: Diagnosis present

## 2013-09-28 DIAGNOSIS — N19 Unspecified kidney failure: Secondary | ICD-10-CM | POA: Diagnosis not present

## 2013-09-28 DIAGNOSIS — G9389 Other specified disorders of brain: Secondary | ICD-10-CM | POA: Diagnosis not present

## 2013-09-28 DIAGNOSIS — F0280 Dementia in other diseases classified elsewhere without behavioral disturbance: Secondary | ICD-10-CM | POA: Diagnosis not present

## 2013-09-28 DIAGNOSIS — I369 Nonrheumatic tricuspid valve disorder, unspecified: Secondary | ICD-10-CM | POA: Diagnosis not present

## 2013-09-28 DIAGNOSIS — G929 Unspecified toxic encephalopathy: Secondary | ICD-10-CM | POA: Diagnosis present

## 2013-09-28 DIAGNOSIS — I059 Rheumatic mitral valve disease, unspecified: Secondary | ICD-10-CM | POA: Diagnosis not present

## 2013-09-28 DIAGNOSIS — G219 Secondary parkinsonism, unspecified: Secondary | ICD-10-CM | POA: Diagnosis not present

## 2013-09-28 DIAGNOSIS — E119 Type 2 diabetes mellitus without complications: Secondary | ICD-10-CM | POA: Diagnosis present

## 2013-09-28 DIAGNOSIS — E860 Dehydration: Secondary | ICD-10-CM | POA: Diagnosis present

## 2013-09-28 DIAGNOSIS — R4182 Altered mental status, unspecified: Secondary | ICD-10-CM | POA: Diagnosis not present

## 2013-09-28 DIAGNOSIS — E785 Hyperlipidemia, unspecified: Secondary | ICD-10-CM | POA: Diagnosis present

## 2013-09-28 DIAGNOSIS — J811 Chronic pulmonary edema: Secondary | ICD-10-CM | POA: Diagnosis not present

## 2013-09-28 DIAGNOSIS — R443 Hallucinations, unspecified: Secondary | ICD-10-CM | POA: Diagnosis not present

## 2013-09-28 DIAGNOSIS — N183 Chronic kidney disease, stage 3 unspecified: Secondary | ICD-10-CM | POA: Diagnosis present

## 2013-09-28 DIAGNOSIS — I359 Nonrheumatic aortic valve disorder, unspecified: Secondary | ICD-10-CM | POA: Diagnosis not present

## 2013-09-28 DIAGNOSIS — E039 Hypothyroidism, unspecified: Secondary | ICD-10-CM | POA: Diagnosis present

## 2013-09-28 DIAGNOSIS — R319 Hematuria, unspecified: Secondary | ICD-10-CM | POA: Diagnosis not present

## 2013-09-28 DIAGNOSIS — F329 Major depressive disorder, single episode, unspecified: Secondary | ICD-10-CM | POA: Diagnosis present

## 2013-09-28 DIAGNOSIS — I129 Hypertensive chronic kidney disease with stage 1 through stage 4 chronic kidney disease, or unspecified chronic kidney disease: Secondary | ICD-10-CM | POA: Diagnosis present

## 2013-09-28 DIAGNOSIS — H409 Unspecified glaucoma: Secondary | ICD-10-CM | POA: Diagnosis present

## 2013-09-28 DIAGNOSIS — I5031 Acute diastolic (congestive) heart failure: Secondary | ICD-10-CM | POA: Diagnosis not present

## 2013-09-28 DIAGNOSIS — R569 Unspecified convulsions: Secondary | ICD-10-CM | POA: Diagnosis not present

## 2013-09-28 DIAGNOSIS — N179 Acute kidney failure, unspecified: Secondary | ICD-10-CM | POA: Diagnosis not present

## 2013-10-09 DIAGNOSIS — F0280 Dementia in other diseases classified elsewhere without behavioral disturbance: Secondary | ICD-10-CM | POA: Diagnosis not present

## 2013-10-09 DIAGNOSIS — E039 Hypothyroidism, unspecified: Secondary | ICD-10-CM | POA: Diagnosis not present

## 2013-10-09 DIAGNOSIS — G3183 Dementia with Lewy bodies: Secondary | ICD-10-CM | POA: Diagnosis not present

## 2013-10-09 DIAGNOSIS — N189 Chronic kidney disease, unspecified: Secondary | ICD-10-CM | POA: Diagnosis not present

## 2013-10-09 DIAGNOSIS — F3289 Other specified depressive episodes: Secondary | ICD-10-CM | POA: Diagnosis not present

## 2013-10-09 DIAGNOSIS — I129 Hypertensive chronic kidney disease with stage 1 through stage 4 chronic kidney disease, or unspecified chronic kidney disease: Secondary | ICD-10-CM | POA: Diagnosis not present

## 2013-10-09 DIAGNOSIS — I2510 Atherosclerotic heart disease of native coronary artery without angina pectoris: Secondary | ICD-10-CM | POA: Diagnosis not present

## 2013-10-09 DIAGNOSIS — E119 Type 2 diabetes mellitus without complications: Secondary | ICD-10-CM | POA: Diagnosis not present

## 2013-10-09 DIAGNOSIS — F329 Major depressive disorder, single episode, unspecified: Secondary | ICD-10-CM | POA: Diagnosis not present

## 2013-10-09 DIAGNOSIS — G9341 Metabolic encephalopathy: Secondary | ICD-10-CM | POA: Diagnosis not present

## 2013-10-10 DIAGNOSIS — E119 Type 2 diabetes mellitus without complications: Secondary | ICD-10-CM | POA: Diagnosis not present

## 2013-10-10 DIAGNOSIS — G3183 Dementia with Lewy bodies: Secondary | ICD-10-CM | POA: Diagnosis not present

## 2013-10-10 DIAGNOSIS — G9341 Metabolic encephalopathy: Secondary | ICD-10-CM | POA: Diagnosis not present

## 2013-10-10 DIAGNOSIS — I2510 Atherosclerotic heart disease of native coronary artery without angina pectoris: Secondary | ICD-10-CM | POA: Diagnosis not present

## 2013-10-10 DIAGNOSIS — F0280 Dementia in other diseases classified elsewhere without behavioral disturbance: Secondary | ICD-10-CM | POA: Diagnosis not present

## 2013-10-10 DIAGNOSIS — I129 Hypertensive chronic kidney disease with stage 1 through stage 4 chronic kidney disease, or unspecified chronic kidney disease: Secondary | ICD-10-CM | POA: Diagnosis not present

## 2013-10-12 DIAGNOSIS — G9341 Metabolic encephalopathy: Secondary | ICD-10-CM | POA: Diagnosis not present

## 2013-10-12 DIAGNOSIS — I2510 Atherosclerotic heart disease of native coronary artery without angina pectoris: Secondary | ICD-10-CM | POA: Diagnosis not present

## 2013-10-12 DIAGNOSIS — E119 Type 2 diabetes mellitus without complications: Secondary | ICD-10-CM | POA: Diagnosis not present

## 2013-10-12 DIAGNOSIS — G3183 Dementia with Lewy bodies: Secondary | ICD-10-CM | POA: Diagnosis not present

## 2013-10-12 DIAGNOSIS — I129 Hypertensive chronic kidney disease with stage 1 through stage 4 chronic kidney disease, or unspecified chronic kidney disease: Secondary | ICD-10-CM | POA: Diagnosis not present

## 2013-10-12 DIAGNOSIS — F0280 Dementia in other diseases classified elsewhere without behavioral disturbance: Secondary | ICD-10-CM | POA: Diagnosis not present

## 2013-10-13 DIAGNOSIS — G9341 Metabolic encephalopathy: Secondary | ICD-10-CM | POA: Diagnosis not present

## 2013-10-13 DIAGNOSIS — F0280 Dementia in other diseases classified elsewhere without behavioral disturbance: Secondary | ICD-10-CM | POA: Diagnosis not present

## 2013-10-13 DIAGNOSIS — I129 Hypertensive chronic kidney disease with stage 1 through stage 4 chronic kidney disease, or unspecified chronic kidney disease: Secondary | ICD-10-CM | POA: Diagnosis not present

## 2013-10-13 DIAGNOSIS — E119 Type 2 diabetes mellitus without complications: Secondary | ICD-10-CM | POA: Diagnosis not present

## 2013-10-13 DIAGNOSIS — G3183 Dementia with Lewy bodies: Secondary | ICD-10-CM | POA: Diagnosis not present

## 2013-10-13 DIAGNOSIS — I2510 Atherosclerotic heart disease of native coronary artery without angina pectoris: Secondary | ICD-10-CM | POA: Diagnosis not present

## 2013-10-15 DIAGNOSIS — I129 Hypertensive chronic kidney disease with stage 1 through stage 4 chronic kidney disease, or unspecified chronic kidney disease: Secondary | ICD-10-CM | POA: Diagnosis not present

## 2013-10-15 DIAGNOSIS — E119 Type 2 diabetes mellitus without complications: Secondary | ICD-10-CM | POA: Diagnosis not present

## 2013-10-15 DIAGNOSIS — F0280 Dementia in other diseases classified elsewhere without behavioral disturbance: Secondary | ICD-10-CM | POA: Diagnosis not present

## 2013-10-15 DIAGNOSIS — I2510 Atherosclerotic heart disease of native coronary artery without angina pectoris: Secondary | ICD-10-CM | POA: Diagnosis not present

## 2013-10-15 DIAGNOSIS — G9341 Metabolic encephalopathy: Secondary | ICD-10-CM | POA: Diagnosis not present

## 2013-10-16 DIAGNOSIS — G3183 Dementia with Lewy bodies: Secondary | ICD-10-CM | POA: Diagnosis not present

## 2013-10-16 DIAGNOSIS — I129 Hypertensive chronic kidney disease with stage 1 through stage 4 chronic kidney disease, or unspecified chronic kidney disease: Secondary | ICD-10-CM | POA: Diagnosis not present

## 2013-10-16 DIAGNOSIS — E119 Type 2 diabetes mellitus without complications: Secondary | ICD-10-CM | POA: Diagnosis not present

## 2013-10-16 DIAGNOSIS — F0280 Dementia in other diseases classified elsewhere without behavioral disturbance: Secondary | ICD-10-CM | POA: Diagnosis not present

## 2013-10-16 DIAGNOSIS — G9341 Metabolic encephalopathy: Secondary | ICD-10-CM | POA: Diagnosis not present

## 2013-10-16 DIAGNOSIS — I2510 Atherosclerotic heart disease of native coronary artery without angina pectoris: Secondary | ICD-10-CM | POA: Diagnosis not present

## 2013-10-18 DIAGNOSIS — I129 Hypertensive chronic kidney disease with stage 1 through stage 4 chronic kidney disease, or unspecified chronic kidney disease: Secondary | ICD-10-CM | POA: Diagnosis not present

## 2013-10-18 DIAGNOSIS — I2510 Atherosclerotic heart disease of native coronary artery without angina pectoris: Secondary | ICD-10-CM | POA: Diagnosis not present

## 2013-10-18 DIAGNOSIS — G9341 Metabolic encephalopathy: Secondary | ICD-10-CM | POA: Diagnosis not present

## 2013-10-18 DIAGNOSIS — F0280 Dementia in other diseases classified elsewhere without behavioral disturbance: Secondary | ICD-10-CM | POA: Diagnosis not present

## 2013-10-18 DIAGNOSIS — E119 Type 2 diabetes mellitus without complications: Secondary | ICD-10-CM | POA: Diagnosis not present

## 2013-10-19 DIAGNOSIS — I2510 Atherosclerotic heart disease of native coronary artery without angina pectoris: Secondary | ICD-10-CM | POA: Diagnosis not present

## 2013-10-19 DIAGNOSIS — G9341 Metabolic encephalopathy: Secondary | ICD-10-CM | POA: Diagnosis not present

## 2013-10-19 DIAGNOSIS — E119 Type 2 diabetes mellitus without complications: Secondary | ICD-10-CM | POA: Diagnosis not present

## 2013-10-19 DIAGNOSIS — F0280 Dementia in other diseases classified elsewhere without behavioral disturbance: Secondary | ICD-10-CM | POA: Diagnosis not present

## 2013-10-19 DIAGNOSIS — I129 Hypertensive chronic kidney disease with stage 1 through stage 4 chronic kidney disease, or unspecified chronic kidney disease: Secondary | ICD-10-CM | POA: Diagnosis not present

## 2013-10-20 DIAGNOSIS — E119 Type 2 diabetes mellitus without complications: Secondary | ICD-10-CM | POA: Diagnosis not present

## 2013-10-20 DIAGNOSIS — G9341 Metabolic encephalopathy: Secondary | ICD-10-CM | POA: Diagnosis not present

## 2013-10-20 DIAGNOSIS — F0280 Dementia in other diseases classified elsewhere without behavioral disturbance: Secondary | ICD-10-CM | POA: Diagnosis not present

## 2013-10-20 DIAGNOSIS — I2510 Atherosclerotic heart disease of native coronary artery without angina pectoris: Secondary | ICD-10-CM | POA: Diagnosis not present

## 2013-10-20 DIAGNOSIS — I129 Hypertensive chronic kidney disease with stage 1 through stage 4 chronic kidney disease, or unspecified chronic kidney disease: Secondary | ICD-10-CM | POA: Diagnosis not present

## 2013-10-22 DIAGNOSIS — F0280 Dementia in other diseases classified elsewhere without behavioral disturbance: Secondary | ICD-10-CM | POA: Diagnosis not present

## 2013-10-22 DIAGNOSIS — G3183 Dementia with Lewy bodies: Secondary | ICD-10-CM | POA: Diagnosis not present

## 2013-10-22 DIAGNOSIS — I2510 Atherosclerotic heart disease of native coronary artery without angina pectoris: Secondary | ICD-10-CM | POA: Diagnosis not present

## 2013-10-22 DIAGNOSIS — E119 Type 2 diabetes mellitus without complications: Secondary | ICD-10-CM | POA: Diagnosis not present

## 2013-10-22 DIAGNOSIS — G9341 Metabolic encephalopathy: Secondary | ICD-10-CM | POA: Diagnosis not present

## 2013-10-22 DIAGNOSIS — I129 Hypertensive chronic kidney disease with stage 1 through stage 4 chronic kidney disease, or unspecified chronic kidney disease: Secondary | ICD-10-CM | POA: Diagnosis not present

## 2013-10-23 DIAGNOSIS — F0280 Dementia in other diseases classified elsewhere without behavioral disturbance: Secondary | ICD-10-CM | POA: Diagnosis not present

## 2013-10-23 DIAGNOSIS — I2510 Atherosclerotic heart disease of native coronary artery without angina pectoris: Secondary | ICD-10-CM | POA: Diagnosis not present

## 2013-10-23 DIAGNOSIS — G3183 Dementia with Lewy bodies: Secondary | ICD-10-CM | POA: Diagnosis not present

## 2013-10-23 DIAGNOSIS — E119 Type 2 diabetes mellitus without complications: Secondary | ICD-10-CM | POA: Diagnosis not present

## 2013-10-23 DIAGNOSIS — G9341 Metabolic encephalopathy: Secondary | ICD-10-CM | POA: Diagnosis not present

## 2013-10-23 DIAGNOSIS — I129 Hypertensive chronic kidney disease with stage 1 through stage 4 chronic kidney disease, or unspecified chronic kidney disease: Secondary | ICD-10-CM | POA: Diagnosis not present

## 2013-10-24 DIAGNOSIS — E039 Hypothyroidism, unspecified: Secondary | ICD-10-CM | POA: Diagnosis not present

## 2013-10-24 DIAGNOSIS — IMO0001 Reserved for inherently not codable concepts without codable children: Secondary | ICD-10-CM | POA: Diagnosis not present

## 2013-10-24 DIAGNOSIS — E785 Hyperlipidemia, unspecified: Secondary | ICD-10-CM | POA: Diagnosis not present

## 2013-10-24 DIAGNOSIS — N951 Menopausal and female climacteric states: Secondary | ICD-10-CM | POA: Diagnosis not present

## 2013-10-25 DIAGNOSIS — G9341 Metabolic encephalopathy: Secondary | ICD-10-CM | POA: Diagnosis not present

## 2013-10-25 DIAGNOSIS — I129 Hypertensive chronic kidney disease with stage 1 through stage 4 chronic kidney disease, or unspecified chronic kidney disease: Secondary | ICD-10-CM | POA: Diagnosis not present

## 2013-10-25 DIAGNOSIS — I2510 Atherosclerotic heart disease of native coronary artery without angina pectoris: Secondary | ICD-10-CM | POA: Diagnosis not present

## 2013-10-25 DIAGNOSIS — E119 Type 2 diabetes mellitus without complications: Secondary | ICD-10-CM | POA: Diagnosis not present

## 2013-10-25 DIAGNOSIS — F0280 Dementia in other diseases classified elsewhere without behavioral disturbance: Secondary | ICD-10-CM | POA: Diagnosis not present

## 2013-10-26 DIAGNOSIS — F0280 Dementia in other diseases classified elsewhere without behavioral disturbance: Secondary | ICD-10-CM | POA: Diagnosis not present

## 2013-10-26 DIAGNOSIS — G9341 Metabolic encephalopathy: Secondary | ICD-10-CM | POA: Diagnosis not present

## 2013-10-26 DIAGNOSIS — I129 Hypertensive chronic kidney disease with stage 1 through stage 4 chronic kidney disease, or unspecified chronic kidney disease: Secondary | ICD-10-CM | POA: Diagnosis not present

## 2013-10-26 DIAGNOSIS — E119 Type 2 diabetes mellitus without complications: Secondary | ICD-10-CM | POA: Diagnosis not present

## 2013-10-26 DIAGNOSIS — I2510 Atherosclerotic heart disease of native coronary artery without angina pectoris: Secondary | ICD-10-CM | POA: Diagnosis not present

## 2013-10-29 DIAGNOSIS — E119 Type 2 diabetes mellitus without complications: Secondary | ICD-10-CM | POA: Diagnosis not present

## 2013-10-29 DIAGNOSIS — I129 Hypertensive chronic kidney disease with stage 1 through stage 4 chronic kidney disease, or unspecified chronic kidney disease: Secondary | ICD-10-CM | POA: Diagnosis not present

## 2013-10-29 DIAGNOSIS — F0280 Dementia in other diseases classified elsewhere without behavioral disturbance: Secondary | ICD-10-CM | POA: Diagnosis not present

## 2013-10-29 DIAGNOSIS — I2510 Atherosclerotic heart disease of native coronary artery without angina pectoris: Secondary | ICD-10-CM | POA: Diagnosis not present

## 2013-10-29 DIAGNOSIS — G9341 Metabolic encephalopathy: Secondary | ICD-10-CM | POA: Diagnosis not present

## 2013-10-29 DIAGNOSIS — G3183 Dementia with Lewy bodies: Secondary | ICD-10-CM | POA: Diagnosis not present

## 2013-10-30 DIAGNOSIS — I2510 Atherosclerotic heart disease of native coronary artery without angina pectoris: Secondary | ICD-10-CM | POA: Diagnosis not present

## 2013-10-30 DIAGNOSIS — I129 Hypertensive chronic kidney disease with stage 1 through stage 4 chronic kidney disease, or unspecified chronic kidney disease: Secondary | ICD-10-CM | POA: Diagnosis not present

## 2013-10-30 DIAGNOSIS — G9341 Metabolic encephalopathy: Secondary | ICD-10-CM | POA: Diagnosis not present

## 2013-10-30 DIAGNOSIS — G3183 Dementia with Lewy bodies: Secondary | ICD-10-CM | POA: Diagnosis not present

## 2013-10-30 DIAGNOSIS — F0280 Dementia in other diseases classified elsewhere without behavioral disturbance: Secondary | ICD-10-CM | POA: Diagnosis not present

## 2013-10-30 DIAGNOSIS — E119 Type 2 diabetes mellitus without complications: Secondary | ICD-10-CM | POA: Diagnosis not present

## 2013-10-31 DIAGNOSIS — M810 Age-related osteoporosis without current pathological fracture: Secondary | ICD-10-CM | POA: Diagnosis not present

## 2013-11-01 DIAGNOSIS — I129 Hypertensive chronic kidney disease with stage 1 through stage 4 chronic kidney disease, or unspecified chronic kidney disease: Secondary | ICD-10-CM | POA: Diagnosis not present

## 2013-11-01 DIAGNOSIS — F0280 Dementia in other diseases classified elsewhere without behavioral disturbance: Secondary | ICD-10-CM | POA: Diagnosis not present

## 2013-11-01 DIAGNOSIS — I2510 Atherosclerotic heart disease of native coronary artery without angina pectoris: Secondary | ICD-10-CM | POA: Diagnosis not present

## 2013-11-01 DIAGNOSIS — G9341 Metabolic encephalopathy: Secondary | ICD-10-CM | POA: Diagnosis not present

## 2013-11-01 DIAGNOSIS — E119 Type 2 diabetes mellitus without complications: Secondary | ICD-10-CM | POA: Diagnosis not present

## 2013-11-02 DIAGNOSIS — G9341 Metabolic encephalopathy: Secondary | ICD-10-CM | POA: Diagnosis not present

## 2013-11-02 DIAGNOSIS — E119 Type 2 diabetes mellitus without complications: Secondary | ICD-10-CM | POA: Diagnosis not present

## 2013-11-02 DIAGNOSIS — F0280 Dementia in other diseases classified elsewhere without behavioral disturbance: Secondary | ICD-10-CM | POA: Diagnosis not present

## 2013-11-02 DIAGNOSIS — IMO0001 Reserved for inherently not codable concepts without codable children: Secondary | ICD-10-CM | POA: Diagnosis not present

## 2013-11-02 DIAGNOSIS — I2510 Atherosclerotic heart disease of native coronary artery without angina pectoris: Secondary | ICD-10-CM | POA: Diagnosis not present

## 2013-11-02 DIAGNOSIS — I129 Hypertensive chronic kidney disease with stage 1 through stage 4 chronic kidney disease, or unspecified chronic kidney disease: Secondary | ICD-10-CM | POA: Diagnosis not present

## 2013-11-02 DIAGNOSIS — G3183 Dementia with Lewy bodies: Secondary | ICD-10-CM | POA: Diagnosis not present

## 2013-11-02 DIAGNOSIS — E039 Hypothyroidism, unspecified: Secondary | ICD-10-CM | POA: Diagnosis not present

## 2013-11-05 DIAGNOSIS — I129 Hypertensive chronic kidney disease with stage 1 through stage 4 chronic kidney disease, or unspecified chronic kidney disease: Secondary | ICD-10-CM | POA: Diagnosis not present

## 2013-11-05 DIAGNOSIS — I2510 Atherosclerotic heart disease of native coronary artery without angina pectoris: Secondary | ICD-10-CM | POA: Diagnosis not present

## 2013-11-05 DIAGNOSIS — G3183 Dementia with Lewy bodies: Secondary | ICD-10-CM | POA: Diagnosis not present

## 2013-11-05 DIAGNOSIS — F0280 Dementia in other diseases classified elsewhere without behavioral disturbance: Secondary | ICD-10-CM | POA: Diagnosis not present

## 2013-11-05 DIAGNOSIS — G9341 Metabolic encephalopathy: Secondary | ICD-10-CM | POA: Diagnosis not present

## 2013-11-05 DIAGNOSIS — E119 Type 2 diabetes mellitus without complications: Secondary | ICD-10-CM | POA: Diagnosis not present

## 2013-11-06 DIAGNOSIS — F0280 Dementia in other diseases classified elsewhere without behavioral disturbance: Secondary | ICD-10-CM | POA: Diagnosis not present

## 2013-11-06 DIAGNOSIS — G3183 Dementia with Lewy bodies: Secondary | ICD-10-CM | POA: Diagnosis not present

## 2013-11-06 DIAGNOSIS — IMO0001 Reserved for inherently not codable concepts without codable children: Secondary | ICD-10-CM | POA: Diagnosis not present

## 2013-11-06 DIAGNOSIS — E039 Hypothyroidism, unspecified: Secondary | ICD-10-CM | POA: Diagnosis not present

## 2013-11-06 DIAGNOSIS — E119 Type 2 diabetes mellitus without complications: Secondary | ICD-10-CM | POA: Diagnosis not present

## 2013-11-06 DIAGNOSIS — M810 Age-related osteoporosis without current pathological fracture: Secondary | ICD-10-CM | POA: Diagnosis not present

## 2013-11-06 DIAGNOSIS — E785 Hyperlipidemia, unspecified: Secondary | ICD-10-CM | POA: Diagnosis not present

## 2013-11-06 DIAGNOSIS — I2510 Atherosclerotic heart disease of native coronary artery without angina pectoris: Secondary | ICD-10-CM | POA: Diagnosis not present

## 2013-11-06 DIAGNOSIS — I129 Hypertensive chronic kidney disease with stage 1 through stage 4 chronic kidney disease, or unspecified chronic kidney disease: Secondary | ICD-10-CM | POA: Diagnosis not present

## 2013-11-06 DIAGNOSIS — G9341 Metabolic encephalopathy: Secondary | ICD-10-CM | POA: Diagnosis not present

## 2013-11-07 DIAGNOSIS — F0280 Dementia in other diseases classified elsewhere without behavioral disturbance: Secondary | ICD-10-CM | POA: Diagnosis not present

## 2013-11-07 DIAGNOSIS — E119 Type 2 diabetes mellitus without complications: Secondary | ICD-10-CM | POA: Diagnosis not present

## 2013-11-07 DIAGNOSIS — I2510 Atherosclerotic heart disease of native coronary artery without angina pectoris: Secondary | ICD-10-CM | POA: Diagnosis not present

## 2013-11-07 DIAGNOSIS — G9341 Metabolic encephalopathy: Secondary | ICD-10-CM | POA: Diagnosis not present

## 2013-11-07 DIAGNOSIS — I129 Hypertensive chronic kidney disease with stage 1 through stage 4 chronic kidney disease, or unspecified chronic kidney disease: Secondary | ICD-10-CM | POA: Diagnosis not present

## 2013-11-07 DIAGNOSIS — G3183 Dementia with Lewy bodies: Secondary | ICD-10-CM | POA: Diagnosis not present

## 2013-11-09 DIAGNOSIS — I2510 Atherosclerotic heart disease of native coronary artery without angina pectoris: Secondary | ICD-10-CM | POA: Diagnosis not present

## 2013-11-09 DIAGNOSIS — G3183 Dementia with Lewy bodies: Secondary | ICD-10-CM | POA: Diagnosis not present

## 2013-11-09 DIAGNOSIS — G9341 Metabolic encephalopathy: Secondary | ICD-10-CM | POA: Diagnosis not present

## 2013-11-09 DIAGNOSIS — E119 Type 2 diabetes mellitus without complications: Secondary | ICD-10-CM | POA: Diagnosis not present

## 2013-11-09 DIAGNOSIS — F0280 Dementia in other diseases classified elsewhere without behavioral disturbance: Secondary | ICD-10-CM | POA: Diagnosis not present

## 2013-11-09 DIAGNOSIS — I129 Hypertensive chronic kidney disease with stage 1 through stage 4 chronic kidney disease, or unspecified chronic kidney disease: Secondary | ICD-10-CM | POA: Diagnosis not present

## 2013-11-12 DIAGNOSIS — R633 Feeding difficulties, unspecified: Secondary | ICD-10-CM | POA: Diagnosis not present

## 2013-11-12 DIAGNOSIS — K219 Gastro-esophageal reflux disease without esophagitis: Secondary | ICD-10-CM | POA: Diagnosis not present

## 2013-11-12 DIAGNOSIS — T17308A Unspecified foreign body in larynx causing other injury, initial encounter: Secondary | ICD-10-CM | POA: Diagnosis not present

## 2013-11-12 DIAGNOSIS — R1319 Other dysphagia: Secondary | ICD-10-CM | POA: Diagnosis not present

## 2013-11-13 DIAGNOSIS — G9341 Metabolic encephalopathy: Secondary | ICD-10-CM | POA: Diagnosis not present

## 2013-11-13 DIAGNOSIS — E119 Type 2 diabetes mellitus without complications: Secondary | ICD-10-CM | POA: Diagnosis not present

## 2013-11-13 DIAGNOSIS — F0280 Dementia in other diseases classified elsewhere without behavioral disturbance: Secondary | ICD-10-CM | POA: Diagnosis not present

## 2013-11-13 DIAGNOSIS — I2510 Atherosclerotic heart disease of native coronary artery without angina pectoris: Secondary | ICD-10-CM | POA: Diagnosis not present

## 2013-11-13 DIAGNOSIS — I129 Hypertensive chronic kidney disease with stage 1 through stage 4 chronic kidney disease, or unspecified chronic kidney disease: Secondary | ICD-10-CM | POA: Diagnosis not present

## 2013-11-13 DIAGNOSIS — G3183 Dementia with Lewy bodies: Secondary | ICD-10-CM | POA: Diagnosis not present

## 2013-11-14 DIAGNOSIS — I129 Hypertensive chronic kidney disease with stage 1 through stage 4 chronic kidney disease, or unspecified chronic kidney disease: Secondary | ICD-10-CM | POA: Diagnosis not present

## 2013-11-14 DIAGNOSIS — G3183 Dementia with Lewy bodies: Secondary | ICD-10-CM | POA: Diagnosis not present

## 2013-11-14 DIAGNOSIS — G9341 Metabolic encephalopathy: Secondary | ICD-10-CM | POA: Diagnosis not present

## 2013-11-14 DIAGNOSIS — E119 Type 2 diabetes mellitus without complications: Secondary | ICD-10-CM | POA: Diagnosis not present

## 2013-11-14 DIAGNOSIS — I2510 Atherosclerotic heart disease of native coronary artery without angina pectoris: Secondary | ICD-10-CM | POA: Diagnosis not present

## 2013-11-14 DIAGNOSIS — F0280 Dementia in other diseases classified elsewhere without behavioral disturbance: Secondary | ICD-10-CM | POA: Diagnosis not present

## 2013-11-15 DIAGNOSIS — R7989 Other specified abnormal findings of blood chemistry: Secondary | ICD-10-CM | POA: Diagnosis not present

## 2013-11-15 DIAGNOSIS — E119 Type 2 diabetes mellitus without complications: Secondary | ICD-10-CM | POA: Diagnosis not present

## 2013-11-15 DIAGNOSIS — I509 Heart failure, unspecified: Secondary | ICD-10-CM | POA: Diagnosis not present

## 2013-11-15 DIAGNOSIS — M129 Arthropathy, unspecified: Secondary | ICD-10-CM | POA: Diagnosis not present

## 2013-11-15 DIAGNOSIS — J45909 Unspecified asthma, uncomplicated: Secondary | ICD-10-CM | POA: Diagnosis not present

## 2013-11-16 DIAGNOSIS — I2510 Atherosclerotic heart disease of native coronary artery without angina pectoris: Secondary | ICD-10-CM | POA: Diagnosis not present

## 2013-11-16 DIAGNOSIS — G9341 Metabolic encephalopathy: Secondary | ICD-10-CM | POA: Diagnosis not present

## 2013-11-16 DIAGNOSIS — F0280 Dementia in other diseases classified elsewhere without behavioral disturbance: Secondary | ICD-10-CM | POA: Diagnosis not present

## 2013-11-16 DIAGNOSIS — E119 Type 2 diabetes mellitus without complications: Secondary | ICD-10-CM | POA: Diagnosis not present

## 2013-11-16 DIAGNOSIS — I129 Hypertensive chronic kidney disease with stage 1 through stage 4 chronic kidney disease, or unspecified chronic kidney disease: Secondary | ICD-10-CM | POA: Diagnosis not present

## 2013-11-21 DIAGNOSIS — I129 Hypertensive chronic kidney disease with stage 1 through stage 4 chronic kidney disease, or unspecified chronic kidney disease: Secondary | ICD-10-CM | POA: Diagnosis not present

## 2013-11-21 DIAGNOSIS — G9341 Metabolic encephalopathy: Secondary | ICD-10-CM | POA: Diagnosis not present

## 2013-11-21 DIAGNOSIS — I2510 Atherosclerotic heart disease of native coronary artery without angina pectoris: Secondary | ICD-10-CM | POA: Diagnosis not present

## 2013-11-21 DIAGNOSIS — E119 Type 2 diabetes mellitus without complications: Secondary | ICD-10-CM | POA: Diagnosis not present

## 2013-11-21 DIAGNOSIS — F0280 Dementia in other diseases classified elsewhere without behavioral disturbance: Secondary | ICD-10-CM | POA: Diagnosis not present

## 2013-11-23 DIAGNOSIS — I2510 Atherosclerotic heart disease of native coronary artery without angina pectoris: Secondary | ICD-10-CM | POA: Diagnosis not present

## 2013-11-23 DIAGNOSIS — F0280 Dementia in other diseases classified elsewhere without behavioral disturbance: Secondary | ICD-10-CM | POA: Diagnosis not present

## 2013-11-23 DIAGNOSIS — I129 Hypertensive chronic kidney disease with stage 1 through stage 4 chronic kidney disease, or unspecified chronic kidney disease: Secondary | ICD-10-CM | POA: Diagnosis not present

## 2013-11-23 DIAGNOSIS — G9341 Metabolic encephalopathy: Secondary | ICD-10-CM | POA: Diagnosis not present

## 2013-11-23 DIAGNOSIS — E119 Type 2 diabetes mellitus without complications: Secondary | ICD-10-CM | POA: Diagnosis not present

## 2013-11-26 DIAGNOSIS — G9341 Metabolic encephalopathy: Secondary | ICD-10-CM | POA: Diagnosis not present

## 2013-11-26 DIAGNOSIS — F0280 Dementia in other diseases classified elsewhere without behavioral disturbance: Secondary | ICD-10-CM | POA: Diagnosis not present

## 2013-11-26 DIAGNOSIS — E119 Type 2 diabetes mellitus without complications: Secondary | ICD-10-CM | POA: Diagnosis not present

## 2013-11-26 DIAGNOSIS — I2510 Atherosclerotic heart disease of native coronary artery without angina pectoris: Secondary | ICD-10-CM | POA: Diagnosis not present

## 2013-11-26 DIAGNOSIS — I129 Hypertensive chronic kidney disease with stage 1 through stage 4 chronic kidney disease, or unspecified chronic kidney disease: Secondary | ICD-10-CM | POA: Diagnosis not present

## 2013-11-27 DIAGNOSIS — E119 Type 2 diabetes mellitus without complications: Secondary | ICD-10-CM | POA: Diagnosis not present

## 2013-11-27 DIAGNOSIS — G9341 Metabolic encephalopathy: Secondary | ICD-10-CM | POA: Diagnosis not present

## 2013-11-27 DIAGNOSIS — F0280 Dementia in other diseases classified elsewhere without behavioral disturbance: Secondary | ICD-10-CM | POA: Diagnosis not present

## 2013-11-27 DIAGNOSIS — I2510 Atherosclerotic heart disease of native coronary artery without angina pectoris: Secondary | ICD-10-CM | POA: Diagnosis not present

## 2013-11-27 DIAGNOSIS — I129 Hypertensive chronic kidney disease with stage 1 through stage 4 chronic kidney disease, or unspecified chronic kidney disease: Secondary | ICD-10-CM | POA: Diagnosis not present

## 2013-11-27 DIAGNOSIS — G3183 Dementia with Lewy bodies: Secondary | ICD-10-CM | POA: Diagnosis not present

## 2013-11-30 DIAGNOSIS — M810 Age-related osteoporosis without current pathological fracture: Secondary | ICD-10-CM | POA: Diagnosis not present

## 2013-12-04 DIAGNOSIS — E039 Hypothyroidism, unspecified: Secondary | ICD-10-CM | POA: Diagnosis not present

## 2013-12-04 DIAGNOSIS — IMO0001 Reserved for inherently not codable concepts without codable children: Secondary | ICD-10-CM | POA: Diagnosis not present

## 2013-12-04 DIAGNOSIS — E785 Hyperlipidemia, unspecified: Secondary | ICD-10-CM | POA: Diagnosis not present

## 2013-12-04 DIAGNOSIS — M810 Age-related osteoporosis without current pathological fracture: Secondary | ICD-10-CM | POA: Diagnosis not present

## 2013-12-07 DIAGNOSIS — E119 Type 2 diabetes mellitus without complications: Secondary | ICD-10-CM | POA: Diagnosis not present

## 2013-12-07 DIAGNOSIS — I129 Hypertensive chronic kidney disease with stage 1 through stage 4 chronic kidney disease, or unspecified chronic kidney disease: Secondary | ICD-10-CM | POA: Diagnosis not present

## 2013-12-07 DIAGNOSIS — I2510 Atherosclerotic heart disease of native coronary artery without angina pectoris: Secondary | ICD-10-CM | POA: Diagnosis not present

## 2013-12-07 DIAGNOSIS — F0280 Dementia in other diseases classified elsewhere without behavioral disturbance: Secondary | ICD-10-CM | POA: Diagnosis not present

## 2013-12-07 DIAGNOSIS — G9341 Metabolic encephalopathy: Secondary | ICD-10-CM | POA: Diagnosis not present

## 2013-12-08 DIAGNOSIS — E119 Type 2 diabetes mellitus without complications: Secondary | ICD-10-CM | POA: Diagnosis not present

## 2013-12-08 DIAGNOSIS — F329 Major depressive disorder, single episode, unspecified: Secondary | ICD-10-CM | POA: Diagnosis not present

## 2013-12-08 DIAGNOSIS — I2510 Atherosclerotic heart disease of native coronary artery without angina pectoris: Secondary | ICD-10-CM | POA: Diagnosis not present

## 2013-12-08 DIAGNOSIS — G3183 Dementia with Lewy bodies: Secondary | ICD-10-CM | POA: Diagnosis not present

## 2013-12-08 DIAGNOSIS — N189 Chronic kidney disease, unspecified: Secondary | ICD-10-CM | POA: Diagnosis not present

## 2013-12-08 DIAGNOSIS — Z794 Long term (current) use of insulin: Secondary | ICD-10-CM | POA: Diagnosis not present

## 2013-12-08 DIAGNOSIS — I129 Hypertensive chronic kidney disease with stage 1 through stage 4 chronic kidney disease, or unspecified chronic kidney disease: Secondary | ICD-10-CM | POA: Diagnosis not present

## 2013-12-08 DIAGNOSIS — E039 Hypothyroidism, unspecified: Secondary | ICD-10-CM | POA: Diagnosis not present

## 2013-12-08 DIAGNOSIS — F3289 Other specified depressive episodes: Secondary | ICD-10-CM | POA: Diagnosis not present

## 2013-12-08 DIAGNOSIS — F0280 Dementia in other diseases classified elsewhere without behavioral disturbance: Secondary | ICD-10-CM | POA: Diagnosis not present

## 2013-12-11 DIAGNOSIS — I129 Hypertensive chronic kidney disease with stage 1 through stage 4 chronic kidney disease, or unspecified chronic kidney disease: Secondary | ICD-10-CM | POA: Diagnosis not present

## 2013-12-11 DIAGNOSIS — I2510 Atherosclerotic heart disease of native coronary artery without angina pectoris: Secondary | ICD-10-CM | POA: Diagnosis not present

## 2013-12-11 DIAGNOSIS — G3183 Dementia with Lewy bodies: Secondary | ICD-10-CM | POA: Diagnosis not present

## 2013-12-11 DIAGNOSIS — E119 Type 2 diabetes mellitus without complications: Secondary | ICD-10-CM | POA: Diagnosis not present

## 2013-12-11 DIAGNOSIS — F0280 Dementia in other diseases classified elsewhere without behavioral disturbance: Secondary | ICD-10-CM | POA: Diagnosis not present

## 2013-12-11 DIAGNOSIS — N189 Chronic kidney disease, unspecified: Secondary | ICD-10-CM | POA: Diagnosis not present

## 2013-12-20 DIAGNOSIS — E119 Type 2 diabetes mellitus without complications: Secondary | ICD-10-CM | POA: Diagnosis not present

## 2013-12-20 DIAGNOSIS — G3183 Dementia with Lewy bodies: Secondary | ICD-10-CM | POA: Diagnosis not present

## 2013-12-20 DIAGNOSIS — I2510 Atherosclerotic heart disease of native coronary artery without angina pectoris: Secondary | ICD-10-CM | POA: Diagnosis not present

## 2013-12-20 DIAGNOSIS — I129 Hypertensive chronic kidney disease with stage 1 through stage 4 chronic kidney disease, or unspecified chronic kidney disease: Secondary | ICD-10-CM | POA: Diagnosis not present

## 2013-12-20 DIAGNOSIS — F0280 Dementia in other diseases classified elsewhere without behavioral disturbance: Secondary | ICD-10-CM | POA: Diagnosis not present

## 2013-12-20 DIAGNOSIS — N189 Chronic kidney disease, unspecified: Secondary | ICD-10-CM | POA: Diagnosis not present

## 2013-12-24 DIAGNOSIS — F411 Generalized anxiety disorder: Secondary | ICD-10-CM | POA: Diagnosis not present

## 2013-12-24 DIAGNOSIS — R413 Other amnesia: Secondary | ICD-10-CM | POA: Diagnosis not present

## 2013-12-24 DIAGNOSIS — F329 Major depressive disorder, single episode, unspecified: Secondary | ICD-10-CM | POA: Diagnosis not present

## 2013-12-28 DIAGNOSIS — I129 Hypertensive chronic kidney disease with stage 1 through stage 4 chronic kidney disease, or unspecified chronic kidney disease: Secondary | ICD-10-CM | POA: Diagnosis not present

## 2013-12-28 DIAGNOSIS — E119 Type 2 diabetes mellitus without complications: Secondary | ICD-10-CM | POA: Diagnosis not present

## 2013-12-28 DIAGNOSIS — N189 Chronic kidney disease, unspecified: Secondary | ICD-10-CM | POA: Diagnosis not present

## 2013-12-28 DIAGNOSIS — I2510 Atherosclerotic heart disease of native coronary artery without angina pectoris: Secondary | ICD-10-CM | POA: Diagnosis not present

## 2013-12-28 DIAGNOSIS — F0280 Dementia in other diseases classified elsewhere without behavioral disturbance: Secondary | ICD-10-CM | POA: Diagnosis not present

## 2014-01-03 DIAGNOSIS — F0280 Dementia in other diseases classified elsewhere without behavioral disturbance: Secondary | ICD-10-CM | POA: Diagnosis not present

## 2014-01-03 DIAGNOSIS — I129 Hypertensive chronic kidney disease with stage 1 through stage 4 chronic kidney disease, or unspecified chronic kidney disease: Secondary | ICD-10-CM | POA: Diagnosis not present

## 2014-01-03 DIAGNOSIS — I2510 Atherosclerotic heart disease of native coronary artery without angina pectoris: Secondary | ICD-10-CM | POA: Diagnosis not present

## 2014-01-03 DIAGNOSIS — G3183 Dementia with Lewy bodies: Secondary | ICD-10-CM | POA: Diagnosis not present

## 2014-01-03 DIAGNOSIS — N189 Chronic kidney disease, unspecified: Secondary | ICD-10-CM | POA: Diagnosis not present

## 2014-01-03 DIAGNOSIS — E119 Type 2 diabetes mellitus without complications: Secondary | ICD-10-CM | POA: Diagnosis not present

## 2014-01-15 DIAGNOSIS — G3183 Dementia with Lewy bodies: Secondary | ICD-10-CM | POA: Diagnosis not present

## 2014-01-15 DIAGNOSIS — E119 Type 2 diabetes mellitus without complications: Secondary | ICD-10-CM | POA: Diagnosis not present

## 2014-01-15 DIAGNOSIS — I2510 Atherosclerotic heart disease of native coronary artery without angina pectoris: Secondary | ICD-10-CM | POA: Diagnosis not present

## 2014-01-15 DIAGNOSIS — F0280 Dementia in other diseases classified elsewhere without behavioral disturbance: Secondary | ICD-10-CM | POA: Diagnosis not present

## 2014-01-15 DIAGNOSIS — I129 Hypertensive chronic kidney disease with stage 1 through stage 4 chronic kidney disease, or unspecified chronic kidney disease: Secondary | ICD-10-CM | POA: Diagnosis not present

## 2014-01-15 DIAGNOSIS — N189 Chronic kidney disease, unspecified: Secondary | ICD-10-CM | POA: Diagnosis not present

## 2014-01-25 DIAGNOSIS — Z96649 Presence of unspecified artificial hip joint: Secondary | ICD-10-CM | POA: Diagnosis not present

## 2014-01-25 DIAGNOSIS — Z96659 Presence of unspecified artificial knee joint: Secondary | ICD-10-CM | POA: Diagnosis not present

## 2014-01-25 DIAGNOSIS — R29898 Other symptoms and signs involving the musculoskeletal system: Secondary | ICD-10-CM | POA: Diagnosis not present

## 2014-02-26 ENCOUNTER — Encounter: Payer: Self-pay | Admitting: Family Medicine

## 2014-02-26 ENCOUNTER — Ambulatory Visit (INDEPENDENT_AMBULATORY_CARE_PROVIDER_SITE_OTHER): Payer: Medicare Other | Admitting: Family Medicine

## 2014-02-26 VITALS — BP 160/76 | HR 80 | Wt 157.0 lb

## 2014-02-26 DIAGNOSIS — E785 Hyperlipidemia, unspecified: Secondary | ICD-10-CM | POA: Insufficient documentation

## 2014-02-26 DIAGNOSIS — Z96642 Presence of left artificial hip joint: Secondary | ICD-10-CM

## 2014-02-26 DIAGNOSIS — N189 Chronic kidney disease, unspecified: Secondary | ICD-10-CM

## 2014-02-26 DIAGNOSIS — E1142 Type 2 diabetes mellitus with diabetic polyneuropathy: Secondary | ICD-10-CM | POA: Insufficient documentation

## 2014-02-26 DIAGNOSIS — Z9861 Coronary angioplasty status: Secondary | ICD-10-CM

## 2014-02-26 DIAGNOSIS — I82409 Acute embolism and thrombosis of unspecified deep veins of unspecified lower extremity: Secondary | ICD-10-CM | POA: Insufficient documentation

## 2014-02-26 DIAGNOSIS — I10 Essential (primary) hypertension: Secondary | ICD-10-CM | POA: Diagnosis not present

## 2014-02-26 DIAGNOSIS — E039 Hypothyroidism, unspecified: Secondary | ICD-10-CM | POA: Insufficient documentation

## 2014-02-26 DIAGNOSIS — Z79899 Other long term (current) drug therapy: Secondary | ICD-10-CM | POA: Diagnosis not present

## 2014-02-26 DIAGNOSIS — Z955 Presence of coronary angioplasty implant and graft: Secondary | ICD-10-CM

## 2014-02-26 DIAGNOSIS — E119 Type 2 diabetes mellitus without complications: Secondary | ICD-10-CM | POA: Diagnosis not present

## 2014-02-26 DIAGNOSIS — J449 Chronic obstructive pulmonary disease, unspecified: Secondary | ICD-10-CM

## 2014-02-26 DIAGNOSIS — N200 Calculus of kidney: Secondary | ICD-10-CM

## 2014-02-26 DIAGNOSIS — I2510 Atherosclerotic heart disease of native coronary artery without angina pectoris: Secondary | ICD-10-CM

## 2014-02-26 DIAGNOSIS — Z96649 Presence of unspecified artificial hip joint: Secondary | ICD-10-CM

## 2014-02-26 DIAGNOSIS — N3281 Overactive bladder: Secondary | ICD-10-CM | POA: Insufficient documentation

## 2014-02-26 DIAGNOSIS — F0390 Unspecified dementia without behavioral disturbance: Secondary | ICD-10-CM | POA: Insufficient documentation

## 2014-02-26 DIAGNOSIS — N318 Other neuromuscular dysfunction of bladder: Secondary | ICD-10-CM

## 2014-02-26 DIAGNOSIS — E1149 Type 2 diabetes mellitus with other diabetic neurological complication: Secondary | ICD-10-CM

## 2014-02-26 DIAGNOSIS — R29898 Other symptoms and signs involving the musculoskeletal system: Secondary | ICD-10-CM

## 2014-02-26 HISTORY — DX: Atherosclerotic heart disease of native coronary artery without angina pectoris: I25.10

## 2014-02-26 HISTORY — DX: Chronic obstructive pulmonary disease, unspecified: J44.9

## 2014-02-26 HISTORY — DX: Hypothyroidism, unspecified: E03.9

## 2014-02-26 HISTORY — DX: Chronic kidney disease, unspecified: N18.9

## 2014-02-26 HISTORY — DX: Type 2 diabetes mellitus without complications: E11.9

## 2014-02-26 MED ORDER — INSULIN GLARGINE 100 UNIT/ML SOLOSTAR PEN: 10.0000 [IU] | PEN_INJECTOR | Freq: Every day | SUBCUTANEOUS | Status: DC

## 2014-02-26 MED ORDER — LEVOTHYROXINE SODIUM 75 MCG PO TABS: 75.0000 ug | ORAL_TABLET | Freq: Every day | ORAL | Status: DC

## 2014-02-26 MED ORDER — APIXABAN 5 MG PO TABS: 2.5000 mg | ORAL_TABLET | Freq: Two times a day (BID) | ORAL | Status: DC

## 2014-02-26 MED ORDER — SITAGLIPTIN PHOSPHATE 50 MG PO TABS: 50.0000 mg | ORAL_TABLET | Freq: Every day | ORAL | Status: DC

## 2014-02-26 MED ORDER — FLUTICASONE-SALMETEROL 500-50 MCG/DOSE IN AEPB: 1.0000 | INHALATION_SPRAY | Freq: Two times a day (BID) | RESPIRATORY_TRACT | Status: DC

## 2014-02-26 MED ORDER — CARVEDILOL 3.125 MG PO TABS: 3.1250 mg | ORAL_TABLET | Freq: Two times a day (BID) | ORAL | Status: DC

## 2014-02-26 MED ORDER — ATORVASTATIN CALCIUM 40 MG PO TABS: 40.0000 mg | ORAL_TABLET | Freq: Every day | ORAL | Status: DC

## 2014-02-26 MED ORDER — MIRABEGRON ER 25 MG PO TB24: 25.0000 mg | ORAL_TABLET | Freq: Every day | ORAL | Status: DC

## 2014-02-26 MED ORDER — MONTELUKAST SODIUM 10 MG PO TABS: 10.0000 mg | ORAL_TABLET | Freq: Every day | ORAL | Status: DC

## 2014-02-26 MED ORDER — ALBUTEROL SULFATE HFA 108 (90 BASE) MCG/ACT IN AERS: INHALATION_SPRAY | RESPIRATORY_TRACT | Status: DC

## 2014-02-26 NOTE — Progress Notes (Signed)
CC: Briana Werner is a 77 y.o. female is here for Establish Care   Subjective: HPI:  Very pleasant and complex 78 year old here to establish care accompanied by her son-in-law Ed. Recently moved here from [place] on [facility]  She is requesting refills on all of her medications  Patient reports a history of chronic renal insufficiency and believes that her left kidney is not completely nonfunctional due to her history of recurrent nephrolithiasis.  She's uncertain of how well her right kidney is functioning but she's pretty sure it's not near 100%. She believes that she has a stent in her right ureter it was placed over a year ago by her urologist for nephrolithiasis and she was told that there was no need to remove it.  She has a history of requiring dialysis for what she describes as almost a year while hospitalized for sepsis and renal failure due to complications experienced while removing a kidney stone that was "centimeters"in diameter. She currently is not seeing a nephrologist in [place] a history of essential hypertension currently on Coreg. No outside blood pressures to report.  Reports a history of type 2 diabetes that has been extremely uncontrolled per her report up until 2 months ago when Lantus was started. She has been on Januvia really dosed for at least a year and has been taking 10 units of Lantus on a daily basis for the past 2 months. She brings in a blood sugar diary reflecting fasting blood sugars consistently below 120 no lower than 80. Postprandial blood sugars ranging between 150-220. She denies hypoglycemic episodes since starting Lantus.  She does endorse bilateral tingling and burning sensation in both feet that has been present for matter of years and significantly improved while taking Lyrica at an unknown dose however this was stopped by her former physician due to concerns of cognitive decline. Currently her symptoms of burning is described as moderate  severity present only at rest.  Reports a history of hyperthyroidism currently taking levothyroxine 75 mcg  on a daily basis without missed doses. It is unknown when her TSH was checked last however she believes it's been well greater than 3 months.  History of hyperlipidemia currently on Lipitor without right upper quadrant pain nor myalgias. She is uncertain when her cholesterol was checked last she has a history of coronary artery disease and peripheral vascular disease.  History of COPD currently using Advair twice a day with no need for her rescue inhaler over the past month. She denies cough, shortness of breath, wheezing over the past month.  She reports a history coronary artery disease with multiple stents placed years ago. She believes she has also had open heart surgery but is unsure of the specifics. She's on a beta blocker and a statin. She's not on antiplatelet therapy but does take anticoagulation for history of recurrent DVTs. She denies any exertional or rest chest pain or limb claudication.  She reports a history of multiple DVTs she is uncertain which leg this occurred in but has been on chronic anticoagulation for at least a year now. She denies blood in stool nor bruising or bleeding abnormalities. She denies any recent motor or sensory disturbances other than that listed above and below.  Her major complaint today is left leg weakness and pain that has been present ever since left hip replacement a little over a year ago. She has had weakness with flexing the hip and extending the knee that has been accompanied by muscle  atrophy in the medial aspect of the thigh. She's been able to rebuild some of this atrophy with home physical therapy but has reached a plateau over the past 6 months. She has a history of falls both before and after replacement of her left hip along however has not had any falls since she joined her family here in [place] after [place] day. Other than that  listed above she denies any motor or sensory disturbances.  She reports a history of overactive bladder that has been present for matter of years. While taking VESIcare which she is currently taking she still has to urinate every 2 hours and reports urinary urgency that gets in the way her for her quality of life. She wakens multiple times at night to urinate. She denies dysuria, hesitancy, nor any other genitourinary complaints. Since starting VESIcare she has had dry mouth to the point where she always has to keep a bottle of water with her and she's constantly drinking.  Review of Systems - General ROS: negative for - chills, fever, night sweats, weight gain or weight loss Ophthalmic ROS: negative for - decreased vision Psychological ROS: negative for - anxiety or depression ENT ROS: negative for - hearing change, nasal congestion, tinnitus or allergies Hematological and Lymphatic ROS: negative for - bleeding problems, bruising or swollen lymph nodes Breast ROS: negative Respiratory ROS: no cough, shortness of breath, or wheezing Cardiovascular ROS: no chest pain or dyspnea on exertion Gastrointestinal ROS: no abdominal pain, change in bowel habits, or black or bloody stools Genito-Urinary ROS: negative for - genital discharge, genital ulcers, incontinence or abnormal bleeding from genitals Musculoskeletal ROS: negative for - joint pain or muscle pain other than that described above Neurological ROS: negative for - headaches or memory loss Dermatological ROS: negative for lumps, mole changes, rash and skin lesion changes   Past Medical History  Diagnosis Date  . Chronic renal insufficiency 02/26/2014  . COPD (chronic obstructive pulmonary disease) 02/26/2014  . Essential hypertension, benign 02/26/2014  . Hypothyroid 02/26/2014  . Presence of stent in coronary artery in patient with coronary artery disease 02/26/2014  . Type 2 diabetes mellitus 02/26/2014    No past surgical history on  file. No family history on file.  History   Social History  . Marital Status: Married    Spouse Name: N/A    Number of Children: N/A  . Years of Education: N/A   Occupational History  . Not on file.   Social History Main Topics  . Smoking status: Never Smoker   . Smokeless tobacco: Not on file  . Alcohol Use: No  . Drug Use: No  . Sexual Activity: No   Other Topics Concern  . Not on file   Social History Narrative  . No narrative on file     Objective: BP 160/76  Pulse 80  Wt 157 lb (71.215 kg)  General: Alert and Oriented, No Acute Distress HEENT: Pupils equal, round, reactive to light. Conjunctivae clear.  Tacky mucous membranes, pharynx unremarkable Lungs: Clear to auscultation bilaterally, no wheezing/ronchi/rales.  Comfortable work of breathing. Good air movement. Cardiac: Regular rate and rhythm. Normal S1/S2.  No murmurs, rubs, nor gallops. No carotid bruits  Abdomen: Normal bowel sounds, soft and non tender without palpable masses. Extremities: No peripheral edema.  Strong peripheral pulses. Left vastus medialis has considerable atrophy. She has full range of motion in both lower extremities however flexion and left knee extension is 4/5 in strength all other strength testing of  lower extremity is normal, 5 out of 5 Mental Status: No depression, anxiety, nor agitation. Skin: Warm and dry.  Assessment & Plan: Briana Werner was seen today for establish care.  Diagnoses and associated orders for this visit:  Chronic renal insufficiency - COMPLETE METABOLIC PANEL WITH GFR - Ambulatory referral to Nephrology  Essential hypertension, benign - COMPLETE METABOLIC PANEL WITH GFR - carvedilol (COREG) 3.125 MG tablet; Take 1 tablet (3.125 mg total) by mouth 2 (two) times daily with a meal.  Type 2 diabetes mellitus - COMPLETE METABOLIC PANEL WITH GFR - Hemoglobin A1c - Ambulatory referral to Nephrology  Hypothyroid - TSH  Hyperlipidemia - levothyroxine (SYNTHROID,  LEVOTHROID) 75 MCG tablet; Take 1 tablet (75 mcg total) by mouth daily before breakfast.  COPD (chronic obstructive pulmonary disease) - Fluticasone-Salmeterol (ADVAIR) 500-50 MCG/DOSE AEPB; Inhale 1 puff into the lungs 2 (two) times daily. - albuterol (PROVENTIL HFA;VENTOLIN HFA) 108 (90 BASE) MCG/ACT inhaler; Inhale two puffs every 4-6 hours only as needed for shortness of breath or wheezing.  Presence of stent in coronary artery in patient with coronary artery disease  Nephrolithiasis  High risk medications (not anticoagulants) long-term use - CBC  DVT, lower extremity, recurrent - apixaban (ELIQUIS) 5 MG TABS tablet; Take 0.5 tablets (2.5 mg total) by mouth 2 (two) times daily.  Diabetic peripheral neuropathy  Left leg weakness - Ambulatory referral to [facility] post left hip replacement  Overactive bladder  Other Orders - atorvastatin (LIPITOR) 40 MG tablet; Take 1 tablet (40 mg total) by mouth daily. - montelukast (SINGULAIR) 10 MG tablet; Take 1 tablet (10 mg total) by mouth at bedtime. - sitaGLIPtin (JANUVIA) 50 MG tablet; Take 1 tablet (50 mg total) by mouth daily. - Insulin Glargine (LANTUS SOLOSTAR) 100 UNIT/ML Solostar Pen; Inject 10 Units into the skin daily at 10 pm. - mirabegron ER (MYRBETRIQ) 25 MG TB24 tablet; Take 1 tablet (25 mg total) by mouth daily.    CRI: Referral to nephrology, we'll start by checking renal function today to ensure meds are dose adjusted properly HTN: Uncontrolled, discussed sodium restrictions prior to increasing coreg, recheck one month. Type 2 DM: Clinically controlled continue [place] and lantus pending A1c Hypothyroidism: Clinically controlled due for TSH continue levothyroxine pending this result Hyperlipidemia: She's not fasting today we'll obtain this sometime in the near future, continue Lipitor given atherosclerotic disease COPD: Controlled continue Advair and as needed use of albuterol Coronary artery disease:  Stable requesting outside labs regarding antiplatelet therapy Nephrolithiasis: Stable DVT history: Obtain outside labs To ensure that there is an indication to keep her on chronic anticoagulation, checking hemoglobin level and platelets. Continue eliquis an outside records Diabetic peripheral neuropathy: Uncontrolled, I like her to hold off on restarting her Coumadin until we see how she cognitively adjusts from VESIcare to mybetric Left leg weakness status post left hip replacement: Uncontrolled, she is a fall risk, Home physical therapy has been ordered  Overactive bladder: Uncontrolled stop VESIcare switch to mybetric  60 minutes spent face-to-face during visit today of which at least 50% was counseling or coordinating care regarding: 1. Chronic renal insufficiency   2. Essential hypertension, benign   3. Type 2 diabetes mellitus   4. Hypothyroid   5. Hyperlipidemia   6. COPD (chronic obstructive pulmonary disease)   7. Presence of stent in coronary artery in patient with coronary artery disease   8. Nephrolithiasis   9. High risk medications (not anticoagulants) long-term use   10. DVT, lower extremity, recurrent  11. Diabetic peripheral neuropathy   12. Left leg weakness   13. Status post left hip replacement   14. Overactive bladder      Return in about 4 weeks (around 03/26/2014).

## 2014-02-27 ENCOUNTER — Telehealth: Payer: Self-pay | Admitting: Family Medicine

## 2014-02-27 DIAGNOSIS — F0390 Unspecified dementia without behavioral disturbance: Secondary | ICD-10-CM

## 2014-02-27 LAB — HEMOGLOBIN A1C
Hgb A1c MFr Bld: 6.4 % — ABNORMAL HIGH (ref <5.7)
Mean Plasma Glucose: 137 mg/dL — ABNORMAL HIGH (ref <117)

## 2014-02-27 LAB — COMPLETE METABOLIC PANEL WITH GFR
ALBUMIN: 4.4 g/dL (ref 3.5–5.2)
ALT: 20 U/L (ref 0–35)
AST: 35 U/L (ref 0–37)
Alkaline Phosphatase: 89 U/L (ref 39–117)
BUN: 35 mg/dL — AB (ref 6–23)
CALCIUM: 10.8 mg/dL — AB (ref 8.4–10.5)
CO2: 27 mEq/L (ref 19–32)
Chloride: 102 mEq/L (ref 96–112)
Creat: 1.41 mg/dL — ABNORMAL HIGH (ref 0.50–1.10)
GFR, EST AFRICAN AMERICAN: 42 mL/min — AB
GFR, EST NON AFRICAN AMERICAN: 36 mL/min — AB
GLUCOSE: 111 mg/dL — AB (ref 70–99)
POTASSIUM: 4.8 meq/L (ref 3.5–5.3)
Sodium: 140 mEq/L (ref 135–145)
Total Bilirubin: 0.9 mg/dL (ref 0.2–1.2)
Total Protein: 7.2 g/dL (ref 6.0–8.3)

## 2014-02-27 LAB — CBC
HCT: 39 % (ref 36.0–46.0)
Hemoglobin: 13 g/dL (ref 12.0–15.0)
MCH: 30.2 pg (ref 26.0–34.0)
MCHC: 33.3 g/dL (ref 30.0–36.0)
MCV: 90.7 fL (ref 78.0–100.0)
PLATELETS: 247 10*3/uL (ref 150–400)
RBC: 4.3 MIL/uL (ref 3.87–5.11)
RDW: 13.1 % (ref 11.5–15.5)
WBC: 8.9 10*3/uL (ref 4.0–10.5)

## 2014-02-27 LAB — TSH: TSH: 4.122 u[IU]/mL (ref 0.350–4.500)

## 2014-02-27 MED ORDER — RIVASTIGMINE TARTRATE 1.5 MG PO CAPS: 1.5000 mg | ORAL_CAPSULE | Freq: Two times a day (BID) | ORAL | Status: DC

## 2014-02-27 NOTE — Telephone Encounter (Signed)
Left message on vm with results  

## 2014-02-27 NOTE — Telephone Encounter (Signed)
rivastig refill

## 2014-02-27 NOTE — Telephone Encounter (Signed)
[name], Will you please let patient know that her thyroid function and A1c were at goal.  Hemoglobin was normal.  Kidney function was moderately low as expected based on her history of kidney disease.  Her calcium was elevated and I'd recommend she have her parathyroid hormone checked to see if this is influencing her calcium, a lab order has been printed.  I'd recommend she have this checked as a lab only visit at her convenience.

## 2014-02-28 ENCOUNTER — Encounter: Payer: Self-pay | Admitting: Family Medicine

## 2014-02-28 LAB — PTH, INTACT AND CALCIUM
CALCIUM: 10 mg/dL (ref 8.4–10.5)
PTH: 15.3 pg/mL (ref 14.0–72.0)

## 2014-03-13 DIAGNOSIS — J45909 Unspecified asthma, uncomplicated: Secondary | ICD-10-CM | POA: Diagnosis present

## 2014-03-13 DIAGNOSIS — E039 Hypothyroidism, unspecified: Secondary | ICD-10-CM | POA: Diagnosis present

## 2014-03-13 DIAGNOSIS — I4892 Unspecified atrial flutter: Secondary | ICD-10-CM | POA: Diagnosis not present

## 2014-03-13 DIAGNOSIS — J984 Other disorders of lung: Secondary | ICD-10-CM | POA: Diagnosis not present

## 2014-03-13 DIAGNOSIS — N183 Chronic kidney disease, stage 3 unspecified: Secondary | ICD-10-CM | POA: Diagnosis not present

## 2014-03-13 DIAGNOSIS — I517 Cardiomegaly: Secondary | ICD-10-CM | POA: Diagnosis not present

## 2014-03-13 DIAGNOSIS — F411 Generalized anxiety disorder: Secondary | ICD-10-CM | POA: Diagnosis present

## 2014-03-13 DIAGNOSIS — Z951 Presence of aortocoronary bypass graft: Secondary | ICD-10-CM | POA: Diagnosis not present

## 2014-03-13 DIAGNOSIS — I2510 Atherosclerotic heart disease of native coronary artery without angina pectoris: Secondary | ICD-10-CM | POA: Diagnosis not present

## 2014-03-13 DIAGNOSIS — R0602 Shortness of breath: Secondary | ICD-10-CM | POA: Diagnosis not present

## 2014-03-13 DIAGNOSIS — E119 Type 2 diabetes mellitus without complications: Secondary | ICD-10-CM | POA: Diagnosis not present

## 2014-03-13 DIAGNOSIS — R079 Chest pain, unspecified: Secondary | ICD-10-CM | POA: Diagnosis not present

## 2014-03-13 DIAGNOSIS — I059 Rheumatic mitral valve disease, unspecified: Secondary | ICD-10-CM | POA: Diagnosis not present

## 2014-03-13 DIAGNOSIS — I209 Angina pectoris, unspecified: Secondary | ICD-10-CM | POA: Diagnosis present

## 2014-03-13 DIAGNOSIS — Z883 Allergy status to other anti-infective agents status: Secondary | ICD-10-CM | POA: Diagnosis not present

## 2014-03-13 DIAGNOSIS — Z7901 Long term (current) use of anticoagulants: Secondary | ICD-10-CM | POA: Diagnosis not present

## 2014-03-14 DIAGNOSIS — I059 Rheumatic mitral valve disease, unspecified: Secondary | ICD-10-CM | POA: Diagnosis not present

## 2014-03-14 DIAGNOSIS — I2510 Atherosclerotic heart disease of native coronary artery without angina pectoris: Secondary | ICD-10-CM | POA: Diagnosis not present

## 2014-03-14 DIAGNOSIS — I517 Cardiomegaly: Secondary | ICD-10-CM | POA: Diagnosis not present

## 2014-03-19 ENCOUNTER — Encounter: Payer: Self-pay | Admitting: Family Medicine

## 2014-03-19 ENCOUNTER — Ambulatory Visit (INDEPENDENT_AMBULATORY_CARE_PROVIDER_SITE_OTHER): Payer: Medicare Other | Admitting: Family Medicine

## 2014-03-19 VITALS — BP 142/70 | HR 94 | Wt 154.0 lb

## 2014-03-19 DIAGNOSIS — I658 Occlusion and stenosis of other precerebral arteries: Secondary | ICD-10-CM

## 2014-03-19 DIAGNOSIS — R319 Hematuria, unspecified: Secondary | ICD-10-CM | POA: Diagnosis not present

## 2014-03-19 DIAGNOSIS — N3281 Overactive bladder: Secondary | ICD-10-CM

## 2014-03-19 DIAGNOSIS — Z955 Presence of coronary angioplasty implant and graft: Secondary | ICD-10-CM

## 2014-03-19 DIAGNOSIS — N318 Other neuromuscular dysfunction of bladder: Secondary | ICD-10-CM | POA: Diagnosis not present

## 2014-03-19 DIAGNOSIS — I6529 Occlusion and stenosis of unspecified carotid artery: Secondary | ICD-10-CM

## 2014-03-19 DIAGNOSIS — I6523 Occlusion and stenosis of bilateral carotid arteries: Secondary | ICD-10-CM

## 2014-03-19 DIAGNOSIS — I4892 Unspecified atrial flutter: Secondary | ICD-10-CM

## 2014-03-19 DIAGNOSIS — N184 Chronic kidney disease, stage 4 (severe): Secondary | ICD-10-CM | POA: Diagnosis not present

## 2014-03-19 DIAGNOSIS — I2510 Atherosclerotic heart disease of native coronary artery without angina pectoris: Secondary | ICD-10-CM | POA: Diagnosis not present

## 2014-03-19 LAB — POCT URINALYSIS DIPSTICK
BILIRUBIN UA: NEGATIVE
Glucose, UA: NEGATIVE
Ketones, UA: NEGATIVE
NITRITE UA: NEGATIVE
PROTEIN UA: NEGATIVE
Spec Grav, UA: 1.015
Urobilinogen, UA: 0.2
pH, UA: 5.5

## 2014-03-19 LAB — BASIC METABOLIC PANEL WITH GFR
BUN: 36 mg/dL — ABNORMAL HIGH (ref 6–23)
CHLORIDE: 104 meq/L (ref 96–112)
CO2: 28 mEq/L (ref 19–32)
CREATININE: 1.48 mg/dL — AB (ref 0.50–1.10)
Calcium: 10.4 mg/dL (ref 8.4–10.5)
GFR, Est African American: 40 mL/min — ABNORMAL LOW
GFR, Est Non African American: 34 mL/min — ABNORMAL LOW
Glucose, Bld: 123 mg/dL — ABNORMAL HIGH (ref 70–99)
Potassium: 5.1 mEq/L (ref 3.5–5.3)
SODIUM: 140 meq/L (ref 135–145)

## 2014-03-19 MED ORDER — SULFAMETHOXAZOLE-TRIMETHOPRIM 800-160 MG PO TABS: ORAL_TABLET | ORAL | Status: AC

## 2014-03-19 MED ORDER — MIRABEGRON ER 50 MG PO TB24: 50.0000 mg | ORAL_TABLET | Freq: Every day | ORAL | Status: DC

## 2014-03-19 NOTE — Progress Notes (Signed)
CC: Briana Werner is a 78 y.o. female is here for 4 week f/u   Subjective: HPI:  Hospital followup for chest pain that required admission from the first to July 3. Admission was preceded by chest heaviness and a crushing sensation localized in the center of the chest radiating into her back. No benefit from nitroglycerin or aspirin.  Symptoms gradually subsided over the course of 24 hours while in the hospital, she is uncertain if anything particularly made it better or worse. Workup included negative troponins, cardiac catheterization showing patent 2 out of 2 bypass grafts, VQ scan with low probability of PE, unremarkable CT scan of the chest. Since July 2 she has had no return of any chest discomfort whatsoever. Since discharge she was given a prescription of Xanax which she has not filled yet and has also started a 81 mg aspirin daily.  Recently remotely she denies any indigestion, abdominal pain, nausea, vomiting, shortness of breath, nor difficulty swallowing.  Patient tells me that since I saw her last over the past today she has had intermittent episodes of what she appears his blood in the urine. Has been accompanied by vague low back pain described as a pressure sensation. Nothing particularly makes the symptoms above better or worse.  She continues to endorse urinary frequency and urgency that has been unchanged since starting the myrbetriq.  She's waking every one to 2 hours in the evening to urinate and urinates every 2 hours during the day. She denies dysuria or clots in the urine. Denies fevers, chills, flank pain.  Follow chronic renal insufficiency: She has nephrology visit scheduled for later in July. She received some contrast during her cardiac catheterization and has not had a metabolic panel checked since then.  She updates me that she was told in [place] she has carotid artery stenosis bilaterally. She has considered beginning evaluation for candidacy for any vascular surgery  however she believes that the risks of surgery outweigh any potential benefits.  Review Of Systems Outlined In HPI  Past Medical History  Diagnosis Date  . Chronic renal insufficiency 02/26/2014  . COPD (chronic obstructive pulmonary disease) 02/26/2014  . Essential hypertension, benign 02/26/2014  . Hypothyroid 02/26/2014  . Presence of stent in coronary artery in patient with coronary artery disease 02/26/2014  . Type 2 diabetes mellitus 02/26/2014    No past surgical history on file. No family history on file.  History   Social History  . Marital Status: Married    Spouse Name: N/A    Number of Children: N/A  . Years of Education: N/A   Occupational History  . Not on file.   Social History Main Topics  . Smoking status: Never Smoker   . Smokeless tobacco: Not on file  . Alcohol Use: No  . Drug Use: No  . Sexual Activity: No   Other Topics Concern  . Not on file   Social History Narrative  . No narrative on file     Objective: BP 142/70  Pulse 94  Wt 154 lb (69.854 kg)  General: Alert and Oriented, No Acute Distress HEENT: Pupils equal, round, reactive to light. Conjunctivae clear.  Moist mucous membranes pharynx unremarkable Lungs: Clear to auscultation bilaterally, no wheezing/ronchi/rales.  Comfortable work of breathing. Good air movement. Cardiac: Regular rate and rhythm. Normal S1/S2.  No murmurs, rubs, nor gallops.   Abdomen: Soft nontender Extremities: No peripheral edema.  Strong peripheral pulses.  Mental Status: No depression, anxiety, nor agitation. Skin: Warm and dry.  Assessment & Plan: Briana Werner was seen today for 4 week f/u.  Diagnoses and associated orders for this visit:  Overactive bladder - mirabegron ER (MYRBETRIQ) 50 MG TB24 tablet; Take 1 tablet (50 mg total) by mouth daily. - Urinalysis Dipstick  Presence of stent in coronary artery in patient with coronary artery disease - Ambulatory referral to Cardiology  Chronic atrial  flutter - Ambulatory referral to Cardiology  Hematuria - Urinalysis Dipstick - Urine Culture - sulfamethoxazole-trimethoprim (SEPTRA DS) 800-160 MG per tablet; One tab twice a day for five days.  Chronic renal insufficiency, stage 4 (severe) - BASIC METABOLIC PANEL WITH GFR  Carotid artery stenosis, bilateral    Overactive bladder: Uncontrolled increasing dose of Myrbetriq Hematuria: UA is suggestive of UTI therefore start Bactrim and we will follow culture Atrial flutter: Controlled continue eliuis, referral to cardiology has been placed Chronic renal insufficiency: Clinically stable but like to check renal function today due to intravenous contrast exposure earlier this month. Carotid artery stenosis: She is on statin, she's taking antiplatelet therapy, will respect her desire for no further intervention for candidacy  For vascular surgery  40 minutes spent face-to-face during visit today of which at least 50% was counseling or coordinating care regarding: 1. Overactive bladder   2. Presence of stent in coronary artery in patient with coronary artery disease   3. Chronic atrial flutter   4. Hematuria   5. Chronic renal insufficiency, stage 4 (severe)   6. Carotid artery stenosis, bilateral        Return in about 4 weeks (around 04/16/2014) for Overactive Bladder Followup.

## 2014-03-21 LAB — URINE CULTURE: Colony Count: 70000

## 2014-03-25 DIAGNOSIS — J449 Chronic obstructive pulmonary disease, unspecified: Secondary | ICD-10-CM | POA: Diagnosis not present

## 2014-03-25 DIAGNOSIS — N189 Chronic kidney disease, unspecified: Secondary | ICD-10-CM | POA: Diagnosis not present

## 2014-03-25 DIAGNOSIS — IMO0001 Reserved for inherently not codable concepts without codable children: Secondary | ICD-10-CM | POA: Diagnosis not present

## 2014-03-25 DIAGNOSIS — Z86718 Personal history of other venous thrombosis and embolism: Secondary | ICD-10-CM | POA: Diagnosis not present

## 2014-03-25 DIAGNOSIS — E1142 Type 2 diabetes mellitus with diabetic polyneuropathy: Secondary | ICD-10-CM | POA: Diagnosis not present

## 2014-03-25 DIAGNOSIS — I2510 Atherosclerotic heart disease of native coronary artery without angina pectoris: Secondary | ICD-10-CM | POA: Diagnosis not present

## 2014-03-25 DIAGNOSIS — R29898 Other symptoms and signs involving the musculoskeletal system: Secondary | ICD-10-CM | POA: Diagnosis not present

## 2014-03-25 DIAGNOSIS — Z7901 Long term (current) use of anticoagulants: Secondary | ICD-10-CM | POA: Diagnosis not present

## 2014-03-25 DIAGNOSIS — E1149 Type 2 diabetes mellitus with other diabetic neurological complication: Secondary | ICD-10-CM | POA: Diagnosis not present

## 2014-03-25 DIAGNOSIS — I119 Hypertensive heart disease without heart failure: Secondary | ICD-10-CM | POA: Diagnosis not present

## 2014-03-25 DIAGNOSIS — N390 Urinary tract infection, site not specified: Secondary | ICD-10-CM | POA: Diagnosis not present

## 2014-03-25 DIAGNOSIS — Z794 Long term (current) use of insulin: Secondary | ICD-10-CM | POA: Diagnosis not present

## 2014-03-25 DIAGNOSIS — I4892 Unspecified atrial flutter: Secondary | ICD-10-CM | POA: Diagnosis not present

## 2014-03-25 DIAGNOSIS — Z9181 History of falling: Secondary | ICD-10-CM | POA: Diagnosis not present

## 2014-03-25 DIAGNOSIS — Z96649 Presence of unspecified artificial hip joint: Secondary | ICD-10-CM | POA: Diagnosis not present

## 2014-03-26 ENCOUNTER — Other Ambulatory Visit: Payer: Self-pay | Admitting: Family Medicine

## 2014-03-26 ENCOUNTER — Telehealth: Payer: Self-pay | Admitting: *Deleted

## 2014-03-26 ENCOUNTER — Other Ambulatory Visit (INDEPENDENT_AMBULATORY_CARE_PROVIDER_SITE_OTHER): Payer: Medicare Other | Admitting: Family Medicine

## 2014-03-26 VITALS — BP 141/72 | HR 91 | Temp 98.0°F | Ht 66.0 in | Wt 151.0 lb

## 2014-03-26 DIAGNOSIS — R109 Unspecified abdominal pain: Secondary | ICD-10-CM | POA: Diagnosis not present

## 2014-03-26 DIAGNOSIS — R350 Frequency of micturition: Secondary | ICD-10-CM | POA: Diagnosis not present

## 2014-03-26 DIAGNOSIS — N183 Chronic kidney disease, stage 3 unspecified: Secondary | ICD-10-CM

## 2014-03-26 LAB — POCT URINALYSIS DIPSTICK
BILIRUBIN UA: NEGATIVE
GLUCOSE UA: NEGATIVE
Ketones, UA: NEGATIVE
Nitrite, UA: NEGATIVE
SPEC GRAV UA: 1.015
Urobilinogen, UA: 0.2
pH, UA: 6

## 2014-03-26 MED ORDER — CIPROFLOXACIN HCL 250 MG PO TABS: 250.0000 mg | ORAL_TABLET | Freq: Two times a day (BID) | ORAL | Status: DC

## 2014-03-26 NOTE — Progress Notes (Signed)
Spoke with Ed, Mrs. Quincey son, and informed him./Hanley Rispoli,CMA

## 2014-03-26 NOTE — Telephone Encounter (Signed)
OK.  See if she has a cardiologist. If so please call them and clarify if she should be taking both.  Please advise  Gerald Stabs of last A1C.

## 2014-03-26 NOTE — Progress Notes (Signed)
Pt came in today for a nurse visit complaining of flank pain and frequent urination./Martino Tompson,CMA

## 2014-03-26 NOTE — Telephone Encounter (Signed)
Gerald Stabs from [facility] called and wanted to know what pt's last A1c was. He also said that he is required to make you aware if you are not already aware that asprin and Eliquis have a drug interaction

## 2014-03-26 NOTE — Progress Notes (Signed)
   Subjective:    Patient ID: Briana Werner, female    DOB: [birth year], 78 y.o.   MRN: [id number]  HPI  Here for UA today.  Completed course of Bactrim for UTI.  Says she is not really feeling any better and is having back pain.  Only has one kidney. No fever.  Urine culture from 03/19/14 didn' grow out single bacteria.   Review of Systems     Objective:   Physical Exam        Assessment & Plan:  UTI- hematuria and back pain. Will tx with cipro. Sent for culture. Will call with results.  Beatrice Lecher, MD

## 2014-03-27 NOTE — Telephone Encounter (Signed)
Briana Werner has already been notified; pt doesn't have a cardiologist,we have referred her to one

## 2014-03-27 NOTE — Telephone Encounter (Signed)
Ok sounds good

## 2014-03-28 DIAGNOSIS — I119 Hypertensive heart disease without heart failure: Secondary | ICD-10-CM | POA: Diagnosis not present

## 2014-03-28 DIAGNOSIS — E1142 Type 2 diabetes mellitus with diabetic polyneuropathy: Secondary | ICD-10-CM | POA: Diagnosis not present

## 2014-03-28 DIAGNOSIS — R29898 Other symptoms and signs involving the musculoskeletal system: Secondary | ICD-10-CM | POA: Diagnosis not present

## 2014-03-28 DIAGNOSIS — N390 Urinary tract infection, site not specified: Secondary | ICD-10-CM | POA: Diagnosis not present

## 2014-03-28 DIAGNOSIS — IMO0001 Reserved for inherently not codable concepts without codable children: Secondary | ICD-10-CM | POA: Diagnosis not present

## 2014-03-28 DIAGNOSIS — E1149 Type 2 diabetes mellitus with other diabetic neurological complication: Secondary | ICD-10-CM | POA: Diagnosis not present

## 2014-03-29 ENCOUNTER — Other Ambulatory Visit: Payer: Self-pay | Admitting: Family Medicine

## 2014-03-29 LAB — URINE CULTURE

## 2014-03-29 MED ORDER — NITROFURANTOIN MACROCRYSTAL 100 MG PO CAPS: 100.0000 mg | ORAL_CAPSULE | Freq: Two times a day (BID) | ORAL | Status: DC

## 2014-04-01 DIAGNOSIS — E1142 Type 2 diabetes mellitus with diabetic polyneuropathy: Secondary | ICD-10-CM | POA: Diagnosis not present

## 2014-04-01 DIAGNOSIS — E1149 Type 2 diabetes mellitus with other diabetic neurological complication: Secondary | ICD-10-CM | POA: Diagnosis not present

## 2014-04-01 DIAGNOSIS — N390 Urinary tract infection, site not specified: Secondary | ICD-10-CM | POA: Diagnosis not present

## 2014-04-01 DIAGNOSIS — I119 Hypertensive heart disease without heart failure: Secondary | ICD-10-CM | POA: Diagnosis not present

## 2014-04-01 DIAGNOSIS — R29898 Other symptoms and signs involving the musculoskeletal system: Secondary | ICD-10-CM | POA: Diagnosis not present

## 2014-04-01 DIAGNOSIS — IMO0001 Reserved for inherently not codable concepts without codable children: Secondary | ICD-10-CM | POA: Diagnosis not present

## 2014-04-02 DIAGNOSIS — E119 Type 2 diabetes mellitus without complications: Secondary | ICD-10-CM | POA: Diagnosis not present

## 2014-04-03 ENCOUNTER — Ambulatory Visit (INDEPENDENT_AMBULATORY_CARE_PROVIDER_SITE_OTHER): Payer: Medicare Other | Admitting: Family Medicine

## 2014-04-03 ENCOUNTER — Encounter: Payer: Self-pay | Admitting: Family Medicine

## 2014-04-03 VITALS — BP 140/81 | HR 75 | Temp 98.5°F | Ht 66.0 in | Wt 151.0 lb

## 2014-04-03 DIAGNOSIS — I10 Essential (primary) hypertension: Secondary | ICD-10-CM

## 2014-04-03 DIAGNOSIS — I6529 Occlusion and stenosis of unspecified carotid artery: Secondary | ICD-10-CM

## 2014-04-03 DIAGNOSIS — N189 Chronic kidney disease, unspecified: Secondary | ICD-10-CM

## 2014-04-03 DIAGNOSIS — F411 Generalized anxiety disorder: Secondary | ICD-10-CM

## 2014-04-03 DIAGNOSIS — I658 Occlusion and stenosis of other precerebral arteries: Secondary | ICD-10-CM | POA: Diagnosis not present

## 2014-04-03 DIAGNOSIS — E1129 Type 2 diabetes mellitus with other diabetic kidney complication: Secondary | ICD-10-CM | POA: Diagnosis not present

## 2014-04-03 DIAGNOSIS — I119 Hypertensive heart disease without heart failure: Secondary | ICD-10-CM | POA: Diagnosis not present

## 2014-04-03 DIAGNOSIS — N390 Urinary tract infection, site not specified: Secondary | ICD-10-CM | POA: Diagnosis not present

## 2014-04-03 DIAGNOSIS — B9689 Other specified bacterial agents as the cause of diseases classified elsewhere: Secondary | ICD-10-CM

## 2014-04-03 DIAGNOSIS — E1122 Type 2 diabetes mellitus with diabetic chronic kidney disease: Secondary | ICD-10-CM

## 2014-04-03 DIAGNOSIS — A499 Bacterial infection, unspecified: Secondary | ICD-10-CM

## 2014-04-03 DIAGNOSIS — E1149 Type 2 diabetes mellitus with other diabetic neurological complication: Secondary | ICD-10-CM | POA: Diagnosis not present

## 2014-04-03 DIAGNOSIS — IMO0001 Reserved for inherently not codable concepts without codable children: Secondary | ICD-10-CM | POA: Diagnosis not present

## 2014-04-03 DIAGNOSIS — E785 Hyperlipidemia, unspecified: Secondary | ICD-10-CM

## 2014-04-03 DIAGNOSIS — E1142 Type 2 diabetes mellitus with diabetic polyneuropathy: Secondary | ICD-10-CM | POA: Diagnosis not present

## 2014-04-03 DIAGNOSIS — R29898 Other symptoms and signs involving the musculoskeletal system: Secondary | ICD-10-CM | POA: Diagnosis not present

## 2014-04-03 MED ORDER — AMBULATORY NON FORMULARY MEDICATION: Status: DC

## 2014-04-03 MED ORDER — ESCITALOPRAM OXALATE 5 MG PO TABS: 5.0000 mg | ORAL_TABLET | Freq: Every day | ORAL | Status: DC

## 2014-04-03 MED ORDER — AMOXICILLIN-POT CLAVULANATE 500-125 MG PO TABS: ORAL_TABLET | ORAL | Status: AC

## 2014-04-03 NOTE — Progress Notes (Signed)
CC: Briana Werner is a 78 y.o. female is here for Follow-up   Subjective: HPI:  Followup UTI: While I was out of town she was provided with Cipro and later nitrofurantoin by another provider. Patient states she continues to have dysuria or frothy urine and questionable gross hematuria. Her son-in-law advised her to hold off on taking nitrofurantoin due to the patient's compromised kidney function. Patient states symptoms are present on a daily basis still moderate and nothing has been making it better or worse. Denies fevers, chills, nausea or flank pain  Essential hypertension: Patient tells me that her blood pressures been taking at home by physical therapy and is consistently below 140/90. Denies chest pain or motor or sensory disturbances  Followup type 2 diabetes: She is requesting refills on eidetic testing strips  Hyperlipidemia: Patient continues on Lipitor a daily basis without murmur quadrant pain nor myalgias. She's not had LDL checked since establish with our practice  Followup anxiety: Patient states she would like to avoid taking Xanax to help with going to bed at night. She was once on Lexapro 10 mg without any known intolerance but this was stopped sometime in the late winter. She tells that she feels anxious and irritable worsened by her husbands declining health along with her is symptoms are moderate in daily basis.  Denies recent or remote thoughts of wanting to harm herself or others   Review Of Systems Outlined In HPI  Past Medical History  Diagnosis Date  . Chronic renal insufficiency 02/26/2014  . COPD (chronic obstructive pulmonary disease) 02/26/2014  . Essential hypertension, benign 02/26/2014  . Hypothyroid 02/26/2014  . Presence of stent in coronary artery in patient with coronary artery disease 02/26/2014  . Type 2 diabetes mellitus 02/26/2014    No past surgical history on file. No family history on file.  History   Social History  . Marital Status: Married    Spouse Name: N/A    Number of Children: N/A  . Years of Education: N/A   Occupational History  . Not on file.   Social History Main Topics  . Smoking status: Never Smoker   . Smokeless tobacco: Not on file  . Alcohol Use: No  . Drug Use: No  . Sexual Activity: No   Other Topics Concern  . Not on file   Social History Narrative  . No narrative on file     Objective: BP 140/81  Pulse 75  Temp(Src) 98.5 F (36.9 C) (Oral)  Ht 5\' 6"  (1.676 m)  Wt 151 lb (68.493 kg)  BMI 24.38 kg/m2  General: Alert and Oriented, No Acute Distress HEENT: Pupils equal, round, reactive to light. Conjunctivae clear.  Moist membranes pharynx unremarkable Lungs: Clear to auscultation bilaterally, no wheezing/ronchi/rales.  Comfortable work of breathing. Good air movement. Cardiac: Regular rate and rhythm. Normal S1/S2.  No murmurs, rubs, nor gallops.   Abdomen: Soft no rigidity Extremities: No peripheral edema.  Strong peripheral pulses.  Mental Status: No depression, anxiety, nor agitation. Skin: Warm and dry.  Assessment & Plan: Genine was seen today for follow-up.  Diagnoses and associated orders for this visit:  Hyperlipidemia - Lipid panel  Essential hypertension, benign  Type 2 diabetes mellitus with diabetic chronic kidney disease  Bacterial UTI - amoxicillin-clavulanate (AUGMENTIN) 500-125 MG per tablet; Take one by mouth every 8 hours for ten total days.  Generalized anxiety disorder - escitalopram (LEXAPRO) 5 MG tablet; Take 1 tablet (5 mg total) by mouth daily.  Other Orders - AMBULATORY  NON FORMULARY MEDICATION; Diabetic Test Strips and Lancets: One Touch Ultra.  Dx Type 2 Diabetes.  Use to check blood sugar twice a day.    Hyperlipidemia: Checking lipid panel Essential hypertension: Controlled continue current antihypertensive regimen Type 2 diabetes: She is not quite due for an A1c test which provided Bacterial UTI: Her enteric coccus species was sensitive to  ampicillin therefore will try Augmentin Generalized anxiety disorder: Uncontrolled chronic condition start 5 mg of Lexapro daily  40 minutes spent face-to-face during v at least 50% was counseling or coordinating care regarding: 1. Hyperlipidemia   2. Essential hypertension, benign   3. Type 2 diabetes mellitus with diabetic chronic kidney disease   4. Bacterial UTI   5. Generalized anxiety disorder       Return in about 2 weeks (around 04/17/2014) for UTI follow up.

## 2014-04-09 DIAGNOSIS — E1142 Type 2 diabetes mellitus with diabetic polyneuropathy: Secondary | ICD-10-CM | POA: Diagnosis not present

## 2014-04-09 DIAGNOSIS — R29898 Other symptoms and signs involving the musculoskeletal system: Secondary | ICD-10-CM | POA: Diagnosis not present

## 2014-04-09 DIAGNOSIS — I119 Hypertensive heart disease without heart failure: Secondary | ICD-10-CM | POA: Diagnosis not present

## 2014-04-09 DIAGNOSIS — IMO0001 Reserved for inherently not codable concepts without codable children: Secondary | ICD-10-CM | POA: Diagnosis not present

## 2014-04-09 DIAGNOSIS — N390 Urinary tract infection, site not specified: Secondary | ICD-10-CM | POA: Diagnosis not present

## 2014-04-09 DIAGNOSIS — E1149 Type 2 diabetes mellitus with other diabetic neurological complication: Secondary | ICD-10-CM | POA: Diagnosis not present

## 2014-04-10 DIAGNOSIS — IMO0001 Reserved for inherently not codable concepts without codable children: Secondary | ICD-10-CM

## 2014-04-10 DIAGNOSIS — E1149 Type 2 diabetes mellitus with other diabetic neurological complication: Secondary | ICD-10-CM

## 2014-04-10 DIAGNOSIS — E1142 Type 2 diabetes mellitus with diabetic polyneuropathy: Secondary | ICD-10-CM

## 2014-04-10 DIAGNOSIS — R29898 Other symptoms and signs involving the musculoskeletal system: Secondary | ICD-10-CM

## 2014-04-11 DIAGNOSIS — I119 Hypertensive heart disease without heart failure: Secondary | ICD-10-CM | POA: Diagnosis not present

## 2014-04-11 DIAGNOSIS — E1149 Type 2 diabetes mellitus with other diabetic neurological complication: Secondary | ICD-10-CM | POA: Diagnosis not present

## 2014-04-11 DIAGNOSIS — IMO0001 Reserved for inherently not codable concepts without codable children: Secondary | ICD-10-CM | POA: Diagnosis not present

## 2014-04-11 DIAGNOSIS — R29898 Other symptoms and signs involving the musculoskeletal system: Secondary | ICD-10-CM | POA: Diagnosis not present

## 2014-04-11 DIAGNOSIS — E1142 Type 2 diabetes mellitus with diabetic polyneuropathy: Secondary | ICD-10-CM | POA: Diagnosis not present

## 2014-04-11 DIAGNOSIS — N390 Urinary tract infection, site not specified: Secondary | ICD-10-CM | POA: Diagnosis not present

## 2014-04-15 ENCOUNTER — Ambulatory Visit (INDEPENDENT_AMBULATORY_CARE_PROVIDER_SITE_OTHER): Payer: Medicare Other | Admitting: Family Medicine

## 2014-04-15 ENCOUNTER — Encounter: Payer: Self-pay | Admitting: Family Medicine

## 2014-04-15 VITALS — BP 125/67 | HR 98 | Wt 155.0 lb

## 2014-04-15 DIAGNOSIS — N318 Other neuromuscular dysfunction of bladder: Secondary | ICD-10-CM | POA: Diagnosis not present

## 2014-04-15 DIAGNOSIS — N390 Urinary tract infection, site not specified: Secondary | ICD-10-CM | POA: Diagnosis not present

## 2014-04-15 DIAGNOSIS — F0390 Unspecified dementia without behavioral disturbance: Secondary | ICD-10-CM | POA: Diagnosis not present

## 2014-04-15 DIAGNOSIS — I658 Occlusion and stenosis of other precerebral arteries: Secondary | ICD-10-CM

## 2014-04-15 DIAGNOSIS — I6529 Occlusion and stenosis of unspecified carotid artery: Secondary | ICD-10-CM | POA: Diagnosis not present

## 2014-04-15 DIAGNOSIS — N3281 Overactive bladder: Secondary | ICD-10-CM

## 2014-04-15 LAB — POCT URINALYSIS DIPSTICK
Bilirubin, UA: NEGATIVE
Glucose, UA: NEGATIVE
KETONES UA: NEGATIVE
Nitrite, UA: NEGATIVE
PH UA: 6
SPEC GRAV UA: 1.015
UROBILINOGEN UA: 0.2

## 2014-04-15 MED ORDER — AMOXICILLIN-POT CLAVULANATE 500-125 MG PO TABS: ORAL_TABLET | ORAL | Status: DC

## 2014-04-15 NOTE — Progress Notes (Signed)
CC: Briana Werner is a 78 y.o. female is here for 2 week f/u UTI   Subjective: HPI:  Followup UTI: Patient states after taking 10 days of Augmentin she feels approximately 80-90% better with respect to dysuria and urinary frequency. She continues to have mild dysuria localized to the urethra is nonradiating and described as burning. She's also noticed improvement with no longer seen bubbles in her urine.Marland Kitchen denies fevers, chills, flank pain, nor any other genitourinary complaints and no longer seen blood in her urine  Followup overactive bladder: States that she's no longer having any dry mouth after stopping Detrol and she's uncertain whether or not urinary frequency has improved since taking myrbetriq. Still gets up every 2 hours to the bathroom in the middle the night.  Dementia: Family believes that her mood has been much better ever since starting Lexapro, no known side effects or intolerances. Patient and family has noticed that she has not had any tearful episodes since starting on this medication.   Review Of Systems Outlined In HPI  Past Medical History  Diagnosis Date  . Chronic renal insufficiency 02/26/2014  . COPD (chronic obstructive pulmonary disease) 02/26/2014  . Essential hypertension, benign 02/26/2014  . Hypothyroid 02/26/2014  . Presence of stent in coronary artery in patient with coronary artery disease 02/26/2014  . Type 2 diabetes mellitus 02/26/2014    No past surgical history on file. No family history on file.  History   Social History  . Marital Status: Married    Spouse Name: N/A    Number of Children: N/A  . Years of Education: N/A   Occupational History  . Not on file.   Social History Main Topics  . Smoking status: Never Smoker   . Smokeless tobacco: Not on file  . Alcohol Use: No  . Drug Use: No  . Sexual Activity: No   Other Topics Concern  . Not on file   Social History Narrative  . No narrative on file     Objective: BP 125/67  Pulse 98   Wt 155 lb (70.308 kg)  Vital signs reviewed. General: Alert and Oriented, No Acute Distress HEENT: Pupils equal, round, reactive to light. Conjunctivae clear.  External ears unremarkable.  Moist mucous membranes. Lungs: Clear and comfortable work of breathing, speaking in full sentences without accessory muscle use. Cardiac: Regular rate and rhythm.  Neuro: CN II-XII grossly intact, gait normal. Extremities: No peripheral edema.  Strong peripheral pulses.  Mental Status: No depression, anxiety, nor agitation. Logical though process. Skin: Warm and dry.  Assessment & Plan: [name] was seen today for 2 week f/u uti.  Diagnoses and associated orders for this visit:  Urinary tract infection, site not specified - Urinalysis Dipstick - Urine Culture - amoxicillin-clavulanate (AUGMENTIN) 500-125 MG per tablet; Take one by mouth every 8 hours for ten total days.  Overactive bladder  Dementia, without behavioral disturbance    Urinary tract infection: Urinalysis today suggestive that the infection has not been completely cleared, restart Augmentin pending a urine culture that was obtained today Overactive bladder: Uncontrolled however her UTI is clouding the picture, continue on Myrbetriq we can get her urinary tract infection cleared Dementia: Controlled continue Lexapro and Exelon   Return if symptoms worsen or fail to improve.

## 2014-04-16 DIAGNOSIS — R29898 Other symptoms and signs involving the musculoskeletal system: Secondary | ICD-10-CM | POA: Diagnosis not present

## 2014-04-16 DIAGNOSIS — E1149 Type 2 diabetes mellitus with other diabetic neurological complication: Secondary | ICD-10-CM | POA: Diagnosis not present

## 2014-04-16 DIAGNOSIS — IMO0001 Reserved for inherently not codable concepts without codable children: Secondary | ICD-10-CM | POA: Diagnosis not present

## 2014-04-16 DIAGNOSIS — N390 Urinary tract infection, site not specified: Secondary | ICD-10-CM | POA: Diagnosis not present

## 2014-04-16 DIAGNOSIS — I119 Hypertensive heart disease without heart failure: Secondary | ICD-10-CM | POA: Diagnosis not present

## 2014-04-16 DIAGNOSIS — E1142 Type 2 diabetes mellitus with diabetic polyneuropathy: Secondary | ICD-10-CM | POA: Diagnosis not present

## 2014-04-17 ENCOUNTER — Ambulatory Visit: Payer: Medicare Other | Admitting: Family Medicine

## 2014-04-18 ENCOUNTER — Telehealth: Payer: Self-pay

## 2014-04-18 DIAGNOSIS — R29898 Other symptoms and signs involving the musculoskeletal system: Secondary | ICD-10-CM | POA: Diagnosis not present

## 2014-04-18 DIAGNOSIS — E1142 Type 2 diabetes mellitus with diabetic polyneuropathy: Secondary | ICD-10-CM | POA: Diagnosis not present

## 2014-04-18 DIAGNOSIS — IMO0001 Reserved for inherently not codable concepts without codable children: Secondary | ICD-10-CM | POA: Diagnosis not present

## 2014-04-18 DIAGNOSIS — E1149 Type 2 diabetes mellitus with other diabetic neurological complication: Secondary | ICD-10-CM | POA: Diagnosis not present

## 2014-04-18 DIAGNOSIS — N390 Urinary tract infection, site not specified: Secondary | ICD-10-CM | POA: Diagnosis not present

## 2014-04-18 DIAGNOSIS — I119 Hypertensive heart disease without heart failure: Secondary | ICD-10-CM | POA: Diagnosis not present

## 2014-04-18 NOTE — Telephone Encounter (Signed)
Mrs. Ruffalo called and stated that she has a rash all over that looks like the measles. Pt denies SOB, ha, nausea, fever or chest pain. She has been taking Amoxicillin and wondering if it could possibly be an allergic reaction to it. I sent her to Urgent Care and she agreed to go./ Estil Daft

## 2014-04-19 LAB — URINE CULTURE: Colony Count: 75000

## 2014-04-22 ENCOUNTER — Encounter: Payer: Self-pay | Admitting: Family Medicine

## 2014-04-22 ENCOUNTER — Ambulatory Visit (INDEPENDENT_AMBULATORY_CARE_PROVIDER_SITE_OTHER): Payer: Medicare Other | Admitting: Family Medicine

## 2014-04-22 VITALS — BP 143/69 | HR 96 | Wt 153.0 lb

## 2014-04-22 DIAGNOSIS — R300 Dysuria: Secondary | ICD-10-CM

## 2014-04-22 DIAGNOSIS — N318 Other neuromuscular dysfunction of bladder: Secondary | ICD-10-CM

## 2014-04-22 DIAGNOSIS — I6529 Occlusion and stenosis of unspecified carotid artery: Secondary | ICD-10-CM | POA: Diagnosis not present

## 2014-04-22 DIAGNOSIS — N3281 Overactive bladder: Secondary | ICD-10-CM

## 2014-04-22 DIAGNOSIS — I658 Occlusion and stenosis of other precerebral arteries: Secondary | ICD-10-CM

## 2014-04-22 LAB — POCT URINALYSIS DIPSTICK
Bilirubin, UA: NEGATIVE
GLUCOSE UA: NEGATIVE
KETONES UA: NEGATIVE
Nitrite, UA: NEGATIVE
SPEC GRAV UA: 1.015
Urobilinogen, UA: 0.2
pH, UA: 7

## 2014-04-22 MED ORDER — SULFAMETHOXAZOLE-TRIMETHOPRIM 800-160 MG PO TABS: ORAL_TABLET | ORAL | Status: AC

## 2014-04-22 MED ORDER — MIRABEGRON ER 50 MG PO TB24: 50.0000 mg | ORAL_TABLET | Freq: Every day | ORAL | Status: DC

## 2014-04-22 NOTE — Progress Notes (Signed)
CC: Briana Werner is a 78 y.o. female is here for f/u UTI   Subjective: HPI:  Complains of continued dysuria. Since I saw her last symptoms are moderate in severity present all hours of the day described as discomfort at the urethra only when urinating. Nothing else seems to make better or worse. She restarted Augmentin however a few days after starting it she noticed a faint red rash involving her extremities and trunk that was painless and did not itch. Family was concerned as was an allergic reaction therefore they stopped Augmentin. She has not had any fevers, chills, shortness of breath, wheezing, facial swelling or swelling of the throat. She stopped taking this on Wednesday and rash is slowly improving, worse in warm environments or in the skin folds.  Requesting refills on myrbetriq   Review Of Systems Outlined In HPI  Past Medical History  Diagnosis Date  . Chronic renal insufficiency 02/26/2014  . COPD (chronic obstructive pulmonary disease) 02/26/2014  . Essential hypertension, benign 02/26/2014  . Hypothyroid 02/26/2014  . Presence of stent in coronary artery in patient with coronary artery disease 02/26/2014  . Type 2 diabetes mellitus 02/26/2014    No past surgical history on file. No family history on file.  History   Social History  . Marital Status: Married    Spouse Name: N/A    Number of Children: N/A  . Years of Education: N/A   Occupational History  . Not on file.   Social History Main Topics  . Smoking status: Never Smoker   . Smokeless tobacco: Not on file  . Alcohol Use: No  . Drug Use: No  . Sexual Activity: No   Other Topics Concern  . Not on file   Social History Narrative  . No narrative on file     Objective: BP 143/69  Pulse 96  Wt 153 lb (69.4 kg)  General: Alert and Oriented, No Acute Distress HEENT: Pupils equal, round, reactive to light. Conjunctivae clear.  Moist membranes pharynx unremarkable Lungs: Clear to auscultation  bilaterally, no wheezing/ronchi/rales.  Comfortable work of breathing. Good air movement. Cardiac: Regular rate and rhythm. Normal S1/S2.  No murmurs, rubs, nor gallops.   Extremities: No peripheral edema.  Strong peripheral pulses.  Mental Status: No depression, anxiety, nor agitation. Skin: Warm and dry. Faint red macules around the ankle mild in severity no remarkable skin changes elsewhere  Assessment & Plan: Mycah was seen today for f/u uti.  Diagnoses and associated orders for this visit:  Dysuria - Urinalysis Dipstick - Urine Culture - sulfamethoxazole-trimethoprim (SEPTRA DS) 800-160 MG per tablet; One tab twice a day for five days.  Overactive bladder - mirabegron ER (MYRBETRIQ) 50 MG TB24 tablet; Take 1 tablet (50 mg total) by mouth daily.    Dysuria: Suspect Escherichia coli infection is still present and that duration of Augmentin was ineffective, start Bactrim based on most recent culture Overactive bladder: Continue myrbetriq  Return if symptoms worsen or fail to improve.  25 minutes spent face-to-face during visit today of which at least 50% was counseling or coordinating care regarding: 1. Dysuria   2. Overactive bladder

## 2014-04-24 LAB — URINE CULTURE
COLONY COUNT: NO GROWTH
ORGANISM ID, BACTERIA: NO GROWTH

## 2014-04-25 DIAGNOSIS — E785 Hyperlipidemia, unspecified: Secondary | ICD-10-CM | POA: Diagnosis not present

## 2014-04-26 LAB — LIPID PANEL
Cholesterol: 129 mg/dL (ref 0–200)
HDL: 35 mg/dL — ABNORMAL LOW (ref >39)
LDL Cholesterol: 65 mg/dL (ref 0–99)
TRIGLYCERIDES: 145 mg/dL (ref <150)
Total CHOL/HDL Ratio: 3.7 Ratio
VLDL: 29 mg/dL (ref 0–40)

## 2014-05-01 ENCOUNTER — Ambulatory Visit (INDEPENDENT_AMBULATORY_CARE_PROVIDER_SITE_OTHER): Payer: Medicare Other | Admitting: Cardiology

## 2014-05-01 ENCOUNTER — Encounter: Payer: Self-pay | Admitting: Cardiology

## 2014-05-01 ENCOUNTER — Encounter: Payer: Self-pay | Admitting: *Deleted

## 2014-05-01 VITALS — BP 142/66 | HR 75 | Ht 66.0 in | Wt 152.4 lb

## 2014-05-01 DIAGNOSIS — I2510 Atherosclerotic heart disease of native coronary artery without angina pectoris: Secondary | ICD-10-CM

## 2014-05-01 DIAGNOSIS — N183 Chronic kidney disease, stage 3 unspecified: Secondary | ICD-10-CM

## 2014-05-01 DIAGNOSIS — I10 Essential (primary) hypertension: Secondary | ICD-10-CM

## 2014-05-01 DIAGNOSIS — E785 Hyperlipidemia, unspecified: Secondary | ICD-10-CM

## 2014-05-01 DIAGNOSIS — I701 Atherosclerosis of renal artery: Secondary | ICD-10-CM | POA: Diagnosis not present

## 2014-05-01 DIAGNOSIS — I4892 Unspecified atrial flutter: Secondary | ICD-10-CM | POA: Diagnosis not present

## 2014-05-01 DIAGNOSIS — I6529 Occlusion and stenosis of unspecified carotid artery: Secondary | ICD-10-CM

## 2014-05-01 NOTE — Assessment & Plan Note (Signed)
Continue statin. Schedule followup renal Dopplers.

## 2014-05-01 NOTE — Assessment & Plan Note (Signed)
Continue beta blocker and apixaban. 

## 2014-05-01 NOTE — Patient Instructions (Signed)
Your physician recommends that you schedule a follow-up appointment in: 2 [place] has requested that you have a carotid duplex. This test is an ultrasound of the carotid arteries in your neck. It looks at blood flow through these arteries that supply the brain with blood. Allow one hour for this exam. There are no restrictions or special instructions.   Your physician has requested that you have a renal artery duplex. During this test, an ultrasound is used to evaluate blood flow to the kidneys. Allow one hour for this exam. Do not eat after midnight the day before and avoid carbonated beverages. Take your medications as you usually do.

## 2014-05-01 NOTE — Assessment & Plan Note (Signed)
Schedule followup carotid Dopplers. Continue statin.

## 2014-05-01 NOTE — Assessment & Plan Note (Signed)
Continue aspirin and statin. Obtain all previous records from [place]. Obtain recent catheterization results from [facility].

## 2014-05-01 NOTE — Assessment & Plan Note (Signed)
Continue statin. 

## 2014-05-01 NOTE — Progress Notes (Signed)
HPI: 78 year old female for establishment with history of coronary disease, cerebrovascular disease and renal artery stenosis. Previously resided in [place]. Patient underwent coronary artery bypassing graft in 2004. Records unavailable. She has had stents placed since then but none since 2006. She also has a history of renal artery stenosis and apparently has a nonfunctioning kidney. She has chronic renal insufficiency. She has had previous stents placed in her renal arteries. She also has cerebrovascular disease. She was admitted to [facility] approximately 2 months ago with chest pain and dyspnea. She describes a VQ scan and cardiac catheterization and was told medical therapy warranted. Again no records available. She denies dyspnea, chest pain, palpitations or syncope at present.  Current Outpatient Prescriptions  Medication Sig Dispense Refill  . albuterol (PROVENTIL HFA;VENTOLIN HFA) 108 (90 BASE) MCG/ACT inhaler Inhale two puffs every 4-6 hours only as needed for shortness of breath or wheezing.  1 Inhaler  1  . ALPRAZolam (XANAX) 0.25 MG tablet Take 1 tablet (0.25 mg total) by mouth at bedtime as needed for anxiety.  20 tablet  0  . AMBULATORY NON FORMULARY MEDICATION Diabetic Test Strips and Lancets: One Touch Ultra.  Dx Type 2 Diabetes.  Use to check blood sugar twice a day.  100 Units  3  . apixaban (ELIQUIS) 5 MG TABS tablet Take 0.5 tablets (2.5 mg total) by mouth 2 (two) times daily.  60 tablet  5  . aspirin 81 MG tablet Take 1 tablet (81 mg total) by mouth daily.  30 tablet    . atorvastatin (LIPITOR) 40 MG tablet Take 1 tablet (40 mg total) by mouth daily.  90 tablet  1  . Calcium Carbonate-Vitamin D (CALTRATE 600+D PO) Take by mouth 2 (two) times daily.      . carvedilol (COREG) 3.125 MG tablet Take 1 tablet (3.125 mg total) by mouth 2 (two) times daily with a meal.  180 tablet  1  . escitalopram (LEXAPRO) 5 MG tablet Take 1 tablet (5 mg total) by mouth daily.  30 tablet   3  . Fluticasone-Salmeterol (ADVAIR) 500-50 MCG/DOSE AEPB Inhale 1 puff into the lungs 2 (two) times daily.  60 each  5  . Insulin Glargine (LANTUS) 100 UNIT/ML Solostar Pen Inject 15 Units into the skin. Daily at 10 am      . IRON PO Take by mouth.      . levothyroxine (SYNTHROID, LEVOTHROID) 75 MCG tablet Take 1 tablet (75 mcg total) by mouth daily before breakfast.  90 tablet  1  . mirabegron ER (MYRBETRIQ) 50 MG TB24 tablet Take 1 tablet (50 mg total) by mouth daily.  30 tablet  5  . montelukast (SINGULAIR) 10 MG tablet Take 1 tablet (10 mg total) by mouth at bedtime.  30 tablet  5  . Multiple Vitamins-Minerals (MULTIVITAMIN PO) Take by mouth.      . rivastigmine (EXELON) 1.5 MG capsule Take 1 capsule (1.5 mg total) by mouth 2 (two) times daily.  60 capsule  5  . sitaGLIPtin (JANUVIA) 50 MG tablet Take 1 tablet (50 mg total) by mouth daily.  30 tablet  5   No current facility-administered medications for this visit.    Allergies  Allergen Reactions  . Tetracyclines & Related Anaphylaxis and Shortness Of Breath    dizziness  . Amoxicillin     Questionable rash  . Latex     Bandages, pulls skin  . Tape Dermatitis    Bandaids     Past  Medical History  Diagnosis Date  . Chronic renal insufficiency 02/26/2014  . COPD (chronic obstructive pulmonary disease) 02/26/2014  . Hypothyroid 02/26/2014  . Presence of stent in coronary artery in patient with coronary artery disease 02/26/2014  . Type 2 diabetes mellitus 02/26/2014  . Asthma   . CAD (coronary artery disease)   . Renal artery stenosis   . Atrial fibrillation   . Cerebrovascular disease     Past Surgical History  Procedure Laterality Date  . Coronary artery bypass graft      2004, [place]  . Hip replacement    . Knee surgery Bilateral   . Back surgery    . Cholecystectomy    . Tonsillectomy    . Appendectomy    . Abdominal hysterectomy    . Shoulder surgery    . Nasal sinus surgery      History   Social History    . Marital Status: Married    Spouse Name: N/A    Number of Children: 4  . Years of Education: N/A   Occupational History  . Not on file.   Social History Main Topics  . Smoking status: Never Smoker   . Smokeless tobacco: Not on file  . Alcohol Use: No  . Drug Use: No  . Sexual Activity: No   Other Topics Concern  . Not on file   Social History Narrative  . No narrative on file    Family History  Problem Relation Age of Onset  . CAD Father     ROS: Some arthralgias and recent urinary tract infection but no fevers or chills, productive cough, hemoptysis, dysphasia, odynophagia, melena, hematochezia, rash, seizure activity, orthopnea, PND, pedal edema, claudication. Remaining systems are negative.  Physical Exam:   Blood pressure 142/66, pulse 75, height 5\' 6"  (1.676 m), weight 152 lb 6.4 oz (69.128 kg).  General:  Well developed/well nourished in NAD Skin warm/dry Patient not depressed No peripheral clubbing Back-normal HEENT-normal/normal eyelids Neck supple/normal carotid upstroke bilaterally; no bruits; no JVD; no thyromegaly chest - CTA/ normal expansion CV - RRR/normal S1 and S2; no murmurs, rubs or gallops;  PMI nondisplaced Abdomen -NT/ND, no HSM, no mass, + bowel sounds, no bruit 2+ femoral pulses, no bruits Ext-no edema, chords; diminished distal pulses Neuro-grossly nonfocal  ECG Probable atypical flutter. Left anterior fascicular block. Cannot rule out prior septal infarct.

## 2014-05-01 NOTE — Assessment & Plan Note (Signed)
Blood pressure controlled. Continue present medications. 

## 2014-05-02 ENCOUNTER — Other Ambulatory Visit: Payer: Self-pay | Admitting: Family Medicine

## 2014-05-02 ENCOUNTER — Encounter: Payer: Self-pay | Admitting: Cardiology

## 2014-05-02 NOTE — Telephone Encounter (Signed)
Please call,she was seen yesterday. She need to give you some information.

## 2014-05-02 NOTE — Telephone Encounter (Signed)
This encounter was created in error - please disregard.

## 2014-05-03 ENCOUNTER — Other Ambulatory Visit: Payer: Self-pay | Admitting: *Deleted

## 2014-05-03 DIAGNOSIS — N3281 Overactive bladder: Secondary | ICD-10-CM

## 2014-05-03 MED ORDER — MIRABEGRON ER 50 MG PO TB24: 50.0000 mg | ORAL_TABLET | Freq: Every day | ORAL | Status: DC

## 2014-05-06 ENCOUNTER — Telehealth: Payer: Self-pay

## 2014-05-06 DIAGNOSIS — N3281 Overactive bladder: Secondary | ICD-10-CM

## 2014-05-06 MED ORDER — MIRABEGRON ER 50 MG PO TB24: 50.0000 mg | ORAL_TABLET | Freq: Every day | ORAL | Status: DC

## 2014-05-06 NOTE — Telephone Encounter (Signed)
Sent Myrbetriq refill to [facility].

## 2014-05-07 ENCOUNTER — Other Ambulatory Visit: Payer: Self-pay | Admitting: Family Medicine

## 2014-05-13 DIAGNOSIS — N289 Disorder of kidney and ureter, unspecified: Secondary | ICD-10-CM | POA: Diagnosis not present

## 2014-05-14 ENCOUNTER — Ambulatory Visit (HOSPITAL_BASED_OUTPATIENT_CLINIC_OR_DEPARTMENT_OTHER): Payer: Medicare Other | Admitting: Cardiology

## 2014-05-14 ENCOUNTER — Ambulatory Visit (HOSPITAL_COMMUNITY): Payer: Medicare Other | Attending: Cardiovascular Disease | Admitting: Cardiology

## 2014-05-14 DIAGNOSIS — E785 Hyperlipidemia, unspecified: Secondary | ICD-10-CM | POA: Diagnosis not present

## 2014-05-14 DIAGNOSIS — E119 Type 2 diabetes mellitus without complications: Secondary | ICD-10-CM | POA: Insufficient documentation

## 2014-05-14 DIAGNOSIS — I701 Atherosclerosis of renal artery: Secondary | ICD-10-CM | POA: Diagnosis not present

## 2014-05-14 DIAGNOSIS — N183 Chronic kidney disease, stage 3 unspecified: Secondary | ICD-10-CM | POA: Diagnosis not present

## 2014-05-14 DIAGNOSIS — Z951 Presence of aortocoronary bypass graft: Secondary | ICD-10-CM | POA: Diagnosis not present

## 2014-05-14 DIAGNOSIS — J449 Chronic obstructive pulmonary disease, unspecified: Secondary | ICD-10-CM | POA: Diagnosis not present

## 2014-05-14 DIAGNOSIS — I6529 Occlusion and stenosis of unspecified carotid artery: Secondary | ICD-10-CM | POA: Diagnosis not present

## 2014-05-14 DIAGNOSIS — J4489 Other specified chronic obstructive pulmonary disease: Secondary | ICD-10-CM | POA: Insufficient documentation

## 2014-05-14 DIAGNOSIS — N189 Chronic kidney disease, unspecified: Secondary | ICD-10-CM | POA: Diagnosis not present

## 2014-05-14 DIAGNOSIS — I2510 Atherosclerotic heart disease of native coronary artery without angina pectoris: Secondary | ICD-10-CM | POA: Diagnosis not present

## 2014-05-14 DIAGNOSIS — I10 Essential (primary) hypertension: Secondary | ICD-10-CM

## 2014-05-14 DIAGNOSIS — I129 Hypertensive chronic kidney disease with stage 1 through stage 4 chronic kidney disease, or unspecified chronic kidney disease: Secondary | ICD-10-CM | POA: Insufficient documentation

## 2014-05-14 NOTE — Progress Notes (Signed)
Carotid duplex performed 

## 2014-05-14 NOTE — Progress Notes (Signed)
Renal artery duplex performed  

## 2014-05-16 ENCOUNTER — Other Ambulatory Visit: Payer: Self-pay | Admitting: *Deleted

## 2014-05-16 DIAGNOSIS — R93429 Abnormal radiologic findings on diagnostic imaging of unspecified kidney: Secondary | ICD-10-CM

## 2014-05-23 ENCOUNTER — Ambulatory Visit
Admission: RE | Admit: 2014-05-23 | Discharge: 2014-05-23 | Disposition: A | Discharge status: Home / Self Care | Payer: Medicare Other | Source: Ambulatory Visit | Attending: Cardiology | Admitting: Cardiology

## 2014-05-23 DIAGNOSIS — R9389 Abnormal findings on diagnostic imaging of other specified body structures: Secondary | ICD-10-CM | POA: Diagnosis not present

## 2014-05-23 DIAGNOSIS — R93429 Abnormal radiologic findings on diagnostic imaging of unspecified kidney: Secondary | ICD-10-CM

## 2014-05-23 DIAGNOSIS — N281 Cyst of kidney, acquired: Secondary | ICD-10-CM | POA: Diagnosis not present

## 2014-05-30 ENCOUNTER — Other Ambulatory Visit: Payer: Self-pay | Admitting: Family Medicine

## 2014-05-31 ENCOUNTER — Ambulatory Visit (INDEPENDENT_AMBULATORY_CARE_PROVIDER_SITE_OTHER): Payer: Medicare Other | Admitting: Family Medicine

## 2014-05-31 ENCOUNTER — Encounter: Payer: Self-pay | Admitting: Family Medicine

## 2014-05-31 ENCOUNTER — Ambulatory Visit (INDEPENDENT_AMBULATORY_CARE_PROVIDER_SITE_OTHER): Payer: Medicare Other

## 2014-05-31 VITALS — BP 145/74 | HR 88 | Wt 156.0 lb

## 2014-05-31 DIAGNOSIS — M5137 Other intervertebral disc degeneration, lumbosacral region: Secondary | ICD-10-CM | POA: Diagnosis not present

## 2014-05-31 DIAGNOSIS — M545 Low back pain, unspecified: Secondary | ICD-10-CM

## 2014-05-31 DIAGNOSIS — M47817 Spondylosis without myelopathy or radiculopathy, lumbosacral region: Secondary | ICD-10-CM

## 2014-05-31 DIAGNOSIS — I701 Atherosclerosis of renal artery: Secondary | ICD-10-CM | POA: Diagnosis not present

## 2014-05-31 DIAGNOSIS — M4310 Spondylolisthesis, site unspecified: Secondary | ICD-10-CM | POA: Insufficient documentation

## 2014-05-31 DIAGNOSIS — Q762 Congenital spondylolisthesis: Secondary | ICD-10-CM

## 2014-05-31 MED ORDER — TRAMADOL HCL 50 MG PO TABS: 50.0000 mg | ORAL_TABLET | Freq: Three times a day (TID) | ORAL | Status: DC | PRN

## 2014-05-31 NOTE — Progress Notes (Signed)
CC: Briana Werner is a 78 y.o. female is here for Back Pain   Subjective: HPI:  Patient complains of back pain that was exacerbated one week ago while lifting picture frames out of a chest that was on the floor. While in a bent over position and standing up she felt a pop with a sudden immediate focal pain in the midline of her lower back which had immediate radiation into both anterior thighs. Pain in the back is described as a stiffness and pain moderate in severity and the pain in the thighs is described as a sensitivity and numbness.  Pain is slightly improved with standing, worse with sitting, worse with both extension and flexion. Pain is severely exacerbated while going from a seated to standing or standing to seated position. Pain is also severely exacerbated with coughing, sneezing.  She's had chronic low back pain but the character of this is quite different than anything she experienced in the past. She denies bowel or bladder incontinence, saddle paresthesia nor weakness in the legs. Interventions have included ice without much benefit, Tylenol without much benefit. Nothing else other than that described above makes better or worse.  If she needs to be referred to a neurosurgeon she has requested Dr. Ellene Route because her daughter Briana Werner had good results with him. She denies fevers, chills, abdominal pain, diarrhea, constipation, genitourinary complaints, flank pain nor upper back pain.   Review Of Systems Outlined In HPI  Past Medical History  Diagnosis Date  . Chronic renal insufficiency 02/26/2014  . COPD (chronic obstructive pulmonary disease) 02/26/2014  . Hypothyroid 02/26/2014  . Presence of stent in coronary artery in patient with coronary artery disease 02/26/2014  . Type 2 diabetes mellitus 02/26/2014  . Asthma   . CAD (coronary artery disease)   . Renal artery stenosis   . Atrial fibrillation   . Cerebrovascular disease     Past Surgical History  Procedure Laterality  Date  . Coronary artery bypass graft      2004, [place]  . Hip replacement    . Knee surgery Bilateral   . Back surgery    . Cholecystectomy    . Tonsillectomy    . Appendectomy    . Abdominal hysterectomy    . Shoulder surgery    . Nasal sinus surgery     Family History  Problem Relation Age of Onset  . CAD Father     History   Social History  . Marital Status: Married    Spouse Name: N/A    Number of Children: 4  . Years of Education: N/A   Occupational History  . Not on file.   Social History Main Topics  . Smoking status: Never Smoker   . Smokeless tobacco: Not on file  . Alcohol Use: No  . Drug Use: No  . Sexual Activity: No   Other Topics Concern  . Not on file   Social History Narrative  . No narrative on file     Objective: BP 145/74  Pulse 88  Wt 156 lb (70.761 kg)  General: Alert and Oriented, No Acute Distress HEENT: Pupils equal, round, reactive to light. Conjunctivae clear.  Moist membranes pharynx unremarkable Lungs: Clear to auscultation bilaterally, no wheezing/ronchi/rales.  Comfortable work of breathing. Good air movement. Cardiac: Regular rate and rhythm. Normal S1/S2.  No murmurs, rubs, nor gallops.   Back: Appears to be in quite a bit of pain while going from a seated to standing or standing to seated  position. Midline back pain reproduced with palpation of the L4 and L3 spinous process. Trace paraspinal musculature tenderness mostly just lateral to L3. Straight leg raise is negative bilaterally. She has full range of motion in all 3 planes of the lumbar spine however is extremely cautious and slow with a garding-like behavior when testing all ranges. No overlying skin changes at the site of her discomfort. Comfortably walking with a cane. Extremities: No peripheral edema.  Strong peripheral pulses. Full range of motion and strength in both lower extremities with 1/4 DTRs in L4 and S1 which are symmetric. Mental Status: No depression,  anxiety, nor agitation. Skin: Warm and dry.  Assessment & Plan: Briana Werner was seen today for back pain.  Diagnoses and associated orders for this visit:  Midline low back pain without sciatica - DG Lumbar Spine Complete; Future - Ambulatory referral to Neurosurgery - traMADol (ULTRAM) 50 MG tablet; Take 1 tablet (50 mg total) by mouth every 8 (eight) hours as needed.    X-ray of the lumbar spine was obtained due to severity of her pain along with midline spinous process tenderness revealing spondylolisthesis and suspicion of spinal stenosis. A. urgent referral to her request a neurosurgeon has been placed. Signs and symptoms that would be suggestive of further deterioration and a threat to her spinal canal and or nerve roots were discussed with the patient I would require emergent followup in emergency room. Ultram for pain since she is not a candidate for nonsteroidal anti-inflammatories.   40 minutes spent face-to-face during visit today of which at least 50% was counseling or coordinating care regarding: 1. Midline low back pain without sciatica      Return if symptoms worsen or fail to improve.

## 2014-06-06 ENCOUNTER — Telehealth: Payer: Self-pay | Admitting: Family Medicine

## 2014-06-06 NOTE — Telephone Encounter (Signed)
Re: Referral for back pain We have referred patient to [facility]

## 2014-06-07 ENCOUNTER — Telehealth: Payer: Self-pay | Admitting: Family Medicine

## 2014-06-07 NOTE — Telephone Encounter (Signed)
Re: Referral to [facility] Ms. Aronoff called.  I have informed her that Dr has not reviewed her referral as yet because of the amount of referrals he has ahead of her. She would like to know from Dr if it is okay to continue to wait or should she be referred to another dr. Lujean Rave  you

## 2014-06-07 NOTE — Telephone Encounter (Signed)
Mardene Celeste,  That's kind of concerning that her referral is taking this long.  Is it possible to find another office that can help her sooner since this was placed as a "urgent" consult.

## 2014-06-10 NOTE — Telephone Encounter (Signed)
I agree. I have contacted [facility]. Their process is 5-7 days and they will let us know if Briana Werner is a canidate to be seen by their Doctor.

## 2014-06-17 ENCOUNTER — Telehealth: Payer: Self-pay | Admitting: Family Medicine

## 2014-06-17 NOTE — Telephone Encounter (Signed)
Opened for care everywhere 

## 2014-06-19 ENCOUNTER — Ambulatory Visit (INDEPENDENT_AMBULATORY_CARE_PROVIDER_SITE_OTHER): Payer: Medicare Other | Admitting: Family Medicine

## 2014-06-19 DIAGNOSIS — Z23 Encounter for immunization: Secondary | ICD-10-CM

## 2014-06-22 ENCOUNTER — Other Ambulatory Visit: Payer: Self-pay | Admitting: Family Medicine

## 2014-06-26 ENCOUNTER — Encounter: Payer: Self-pay | Admitting: Cardiology

## 2014-06-26 ENCOUNTER — Ambulatory Visit (INDEPENDENT_AMBULATORY_CARE_PROVIDER_SITE_OTHER): Payer: Medicare Other | Admitting: Cardiology

## 2014-06-26 VITALS — BP 160/70 | HR 80 | Ht 67.0 in | Wt 155.0 lb

## 2014-06-26 DIAGNOSIS — I701 Atherosclerosis of renal artery: Secondary | ICD-10-CM | POA: Diagnosis not present

## 2014-06-26 DIAGNOSIS — I4892 Unspecified atrial flutter: Secondary | ICD-10-CM | POA: Diagnosis not present

## 2014-06-26 DIAGNOSIS — E785 Hyperlipidemia, unspecified: Secondary | ICD-10-CM | POA: Diagnosis not present

## 2014-06-26 DIAGNOSIS — I6523 Occlusion and stenosis of bilateral carotid arteries: Secondary | ICD-10-CM

## 2014-06-26 DIAGNOSIS — I2510 Atherosclerotic heart disease of native coronary artery without angina pectoris: Secondary | ICD-10-CM

## 2014-06-26 DIAGNOSIS — I10 Essential (primary) hypertension: Secondary | ICD-10-CM | POA: Diagnosis not present

## 2014-06-26 DIAGNOSIS — I2583 Coronary atherosclerosis due to lipid rich plaque: Secondary | ICD-10-CM

## 2014-06-26 NOTE — Assessment & Plan Note (Addendum)
Continue statin. Given need for anticoagulation I will discontinue aspirin. We will obtain previous records concerning recent catheterization.

## 2014-06-26 NOTE — Assessment & Plan Note (Signed)
Blood pressure is mildly elevated. I have asked her to track this at home and we will add additional medications as needed.

## 2014-06-26 NOTE — Assessment & Plan Note (Signed)
Continue statin. Followup carotid Dopplers March 2016.

## 2014-06-26 NOTE — Assessment & Plan Note (Signed)
Continue beta blocker for rate control. Continue apixaban; check hemoglobin and renal function.

## 2014-06-26 NOTE — Patient Instructions (Signed)
Your physician wants you to follow-up in: [place] will receive a reminder letter in the mail two months in advance. If you don't receive a letter, please call our office to schedule the follow-up appointment.   STOP ASPIRIN  Your physician recommends that you HAVE LAB WORK TODAY  TRACK BP <130/<85

## 2014-06-26 NOTE — Assessment & Plan Note (Signed)
Continue statin. 

## 2014-06-26 NOTE — Progress Notes (Signed)
HPI: FU coronary disease, cerebrovascular disease and renal artery stenosis. Previously resided in [place]. Patient underwent coronary artery bypassing graft in 2004. Records unavailable. She has had stents placed since then but none since 2006. Echocardiogram October 2014 showed normal LV function, mild left atrial enlargement and mild to moderate mitral regurgitation. Nuclear study October 2014 was normal with an ejection fraction of 56%. Had cath at [facility] 7/15 but no records available. VQ scan September 2015 low probability. Renal Dopplers in September 2015 showed greater than 60% left renal artery stenosis. There was an anechoic density in the right kidney. Dedicated renal ultrasound revealed 2.5 cm cyst in the upper pole of the right kidney. Carotid Dopplers September 2015 showed 60-79% right and 40-59% left stenosis. Followup recommended in 6 months. Since last seen, She denies dyspnea, chest pain or syncope. Occasional palpitations.   Current Outpatient Prescriptions  Medication Sig Dispense Refill  . albuterol (PROVENTIL HFA;VENTOLIN HFA) 108 (90 BASE) MCG/ACT inhaler Inhale two puffs every 4-6 hours only as needed for shortness of breath or wheezing.  1 Inhaler  1  . ALPRAZolam (XANAX) 0.25 MG tablet Take 1 tablet (0.25 mg total) by mouth at bedtime as needed for anxiety.  20 tablet  0  . AMBULATORY NON FORMULARY MEDICATION Diabetic Test Strips and Lancets: One Touch Ultra.  Dx Type 2 Diabetes.  Use to check blood sugar twice a day.  100 Units  3  . apixaban (ELIQUIS) 5 MG TABS tablet Take 0.5 tablets (2.5 mg total) by mouth 2 (two) times daily.  60 tablet  5  . aspirin 81 MG tablet Take 1 tablet (81 mg total) by mouth daily.  30 tablet    . atorvastatin (LIPITOR) 40 MG tablet Take 1 tablet (40 mg total) by mouth daily.  90 tablet  1  . Calcium Carbonate-Vitamin D (CALTRATE 600+D PO) Take by mouth 2 (two) times daily.      . carvedilol (COREG) 6.25 MG tablet TAKE ONE-HALF TABLET BY  MOUTH TWICE DAILY WITH MEALS  90 tablet  0  . escitalopram (LEXAPRO) 5 MG tablet Take 1 tablet (5 mg total) by mouth daily.  30 tablet  3  . Fluticasone-Salmeterol (ADVAIR) 500-50 MCG/DOSE AEPB Inhale 1 puff into the lungs 2 (two) times daily.  60 each  5  . Insulin Glargine (LANTUS) 100 UNIT/ML Solostar Pen Inject 15 Units into the skin. Daily at 10 am      . IRON PO Take by mouth.      . levothyroxine (SYNTHROID, LEVOTHROID) 75 MCG tablet Take 1 tablet (75 mcg total) by mouth daily before breakfast.  90 tablet  1  . montelukast (SINGULAIR) 10 MG tablet Take 1 tablet (10 mg total) by mouth at bedtime.  30 tablet  5  . Multiple Vitamin (MULTIVITAMIN) capsule Take 1 capsule by mouth.      . rivastigmine (EXELON) 1.5 MG capsule Take 1 capsule (1.5 mg total) by mouth 2 (two) times daily.  60 capsule  5  . sitaGLIPtin (JANUVIA) 50 MG tablet Take 1 tablet (50 mg total) by mouth daily.  30 tablet  5  . traMADol (ULTRAM) 50 MG tablet Take 1 tablet (50 mg total) by mouth every 8 (eight) hours as needed.  45 tablet  0  . mirabegron ER (MYRBETRIQ) 50 MG TB24 tablet Take 1 tablet (50 mg total) by mouth daily.  30 tablet  5   No current facility-administered medications for this visit.  Past Medical History  Diagnosis Date  . Chronic renal insufficiency 02/26/2014  . COPD (chronic obstructive pulmonary disease) 02/26/2014  . Hypothyroid 02/26/2014  . Presence of stent in coronary artery in patient with coronary artery disease 02/26/2014  . Type 2 diabetes mellitus 02/26/2014  . Asthma   . CAD (coronary artery disease)   . Renal artery stenosis   . Atrial flutter   . Cerebrovascular disease     Past Surgical History  Procedure Laterality Date  . Coronary artery bypass graft      2004, [place]  . Hip replacement    . Knee surgery Bilateral   . Back surgery    . Cholecystectomy    . Tonsillectomy    . Appendectomy    . Abdominal hysterectomy    . Shoulder surgery    . Nasal sinus surgery       History   Social History  . Marital Status: Married    Spouse Name: N/A    Number of Children: 4  . Years of Education: N/A   Occupational History  . Not on file.   Social History Main Topics  . Smoking status: Never Smoker   . Smokeless tobacco: Not on file  . Alcohol Use: No  . Drug Use: No  . Sexual Activity: No   Other Topics Concern  . Not on file   Social History Narrative  . No narrative on file    ROS: Problems with back pain but no fevers or chills, productive cough, hemoptysis, dysphasia, odynophagia, melena, hematochezia, dysuria, hematuria, rash, seizure activity, orthopnea, PND, pedal edema, claudication. Remaining systems are negative.  Physical Exam: Well-developed well-nourished in no acute distress.  Skin is warm and dry.  HEENT is normal.  Neck is supple.  Chest is clear to auscultation with normal expansion.  Cardiovascular exam is irregular Abdominal exam nontender or distended. No masses palpated. Extremities show no edema. neuro grossly intact

## 2014-06-27 LAB — BASIC METABOLIC PANEL WITH GFR
BUN: 40 mg/dL — ABNORMAL HIGH (ref 6–23)
CO2: 24 meq/L (ref 19–32)
Calcium: 10.4 mg/dL (ref 8.4–10.5)
Chloride: 101 mEq/L (ref 96–112)
Creat: 1.42 mg/dL — ABNORMAL HIGH (ref 0.50–1.10)
GFR, EST AFRICAN AMERICAN: 42 mL/min — AB
GFR, Est Non African American: 36 mL/min — ABNORMAL LOW
Glucose, Bld: 126 mg/dL — ABNORMAL HIGH (ref 70–99)
POTASSIUM: 4.6 meq/L (ref 3.5–5.3)
SODIUM: 138 meq/L (ref 135–145)

## 2014-06-27 LAB — CBC
HCT: 38.5 % (ref 36.0–46.0)
HEMOGLOBIN: 12.8 g/dL (ref 12.0–15.0)
MCH: 30.1 pg (ref 26.0–34.0)
MCHC: 33.2 g/dL (ref 30.0–36.0)
MCV: 90.6 fL (ref 78.0–100.0)
Platelets: 277 10*3/uL (ref 150–400)
RBC: 4.25 MIL/uL (ref 3.87–5.11)
RDW: 13.3 % (ref 11.5–15.5)
WBC: 9.1 10*3/uL (ref 4.0–10.5)

## 2014-06-28 ENCOUNTER — Other Ambulatory Visit: Payer: Self-pay | Admitting: *Deleted

## 2014-06-28 DIAGNOSIS — I82409 Acute embolism and thrombosis of unspecified deep veins of unspecified lower extremity: Secondary | ICD-10-CM

## 2014-06-28 MED ORDER — APIXABAN 5 MG PO TABS: 5.0000 mg | ORAL_TABLET | Freq: Two times a day (BID) | ORAL | Status: DC

## 2014-07-03 DIAGNOSIS — Z6825 Body mass index (BMI) 25.0-25.9, adult: Secondary | ICD-10-CM | POA: Diagnosis not present

## 2014-07-03 DIAGNOSIS — M4316 Spondylolisthesis, lumbar region: Secondary | ICD-10-CM | POA: Diagnosis not present

## 2014-07-03 DIAGNOSIS — I10 Essential (primary) hypertension: Secondary | ICD-10-CM | POA: Diagnosis not present

## 2014-07-05 ENCOUNTER — Other Ambulatory Visit: Payer: Self-pay | Admitting: Neurological Surgery

## 2014-07-05 ENCOUNTER — Ambulatory Visit (INDEPENDENT_AMBULATORY_CARE_PROVIDER_SITE_OTHER): Payer: Medicare Other | Admitting: Family Medicine

## 2014-07-05 ENCOUNTER — Encounter: Payer: Self-pay | Admitting: Family Medicine

## 2014-07-05 VITALS — BP 155/70 | HR 91 | Wt 156.0 lb

## 2014-07-05 DIAGNOSIS — N3281 Overactive bladder: Secondary | ICD-10-CM | POA: Diagnosis not present

## 2014-07-05 DIAGNOSIS — I10 Essential (primary) hypertension: Secondary | ICD-10-CM

## 2014-07-05 DIAGNOSIS — M4316 Spondylolisthesis, lumbar region: Secondary | ICD-10-CM

## 2014-07-05 DIAGNOSIS — I701 Atherosclerosis of renal artery: Secondary | ICD-10-CM

## 2014-07-05 MED ORDER — CARVEDILOL 6.25 MG PO TABS: ORAL_TABLET | ORAL | Status: DC

## 2014-07-05 NOTE — Progress Notes (Signed)
CC: Briana Werner is a 78 y.o. female is here for Hypertension   Subjective: HPI:  Followup hypertension: She brings in a blood pressure log the last 2 weeks showing values 50% of the time in stage I hypertension in 50% of the time in stage II hypertension despite taking carvedilol twice a day. Pulse has also been ranging from the high 90s-80s. While resting. Denies chest pain, shortness of breath, orthopnea, peripheral edema, nor any motor or sensory disturbances  Followup overactive bladder: Patient continues to express that since switching to myrbetriq  she has had improvement with bladder leakage. Currently denies any genitourinary complaints    Review Of Systems Outlined In HPI  Past Medical History  Diagnosis Date  . Chronic renal insufficiency 02/26/2014  . COPD (chronic obstructive pulmonary disease) 02/26/2014  . Hypothyroid 02/26/2014  . Presence of stent in coronary artery in patient with coronary artery disease 02/26/2014  . Type 2 diabetes mellitus 02/26/2014  . Asthma   . CAD (coronary artery disease)   . Renal artery stenosis   . Atrial flutter   . Cerebrovascular disease     Past Surgical History  Procedure Laterality Date  . Coronary artery bypass graft      2004, [place]  . Hip replacement    . Knee surgery Bilateral   . Back surgery    . Cholecystectomy    . Tonsillectomy    . Appendectomy    . Abdominal hysterectomy    . Shoulder surgery    . Nasal sinus surgery     Family History  Problem Relation Age of Onset  . CAD Father     History   Social History  . Marital Status: Married    Spouse Name: N/A    Number of Children: 4  . Years of Education: N/A   Occupational History  . Not on file.   Social History Main Topics  . Smoking status: Never Smoker   . Smokeless tobacco: Not on file  . Alcohol Use: No  . Drug Use: No  . Sexual Activity: No   Other Topics Concern  . Not on file   Social History Narrative  . No narrative on file      Objective: BP 155/70  Pulse 91  Wt 156 lb (70.761 kg)  Vital signs reviewed. General: Alert and Oriented, No Acute Distress HEENT: Pupils equal, round, reactive to light. Conjunctivae clear.  External ears unremarkable.  Moist mucous membranes. Lungs: Clear and comfortable work of breathing, speaking in full sentences without accessory muscle use. Cardiac: Irregularly irregular rhythm without murmur. Less than 100 beats per minute Neuro: CN II-XII grossly intact, gait normal. Extremities: No peripheral edema.  Strong peripheral pulses.  Mental Status: No depression, anxiety, nor agitation. Logical though process. Skin: Warm and dry. Assessment & Plan: Briana Werner was seen today for hypertension.  Diagnoses and associated orders for this visit:  Essential hypertension, benign - carvedilol (COREG) 6.25 MG tablet; FULL TABLET BY MOUTH TWICE DAILY WITH MEALS  Overactive bladder    essential hypertension: Uncontrolled increase carvedilol now full 6.25 mg twice a day   overactive bladder: Controlled and any myrbetriq  I've asked her to drop off a blood pressure log in one week after increasing carvedilol    Return in about 3 months (around 10/05/2014) for BP.

## 2014-07-09 DIAGNOSIS — Z794 Long term (current) use of insulin: Secondary | ICD-10-CM | POA: Diagnosis not present

## 2014-07-09 DIAGNOSIS — N319 Neuromuscular dysfunction of bladder, unspecified: Secondary | ICD-10-CM | POA: Diagnosis not present

## 2014-07-09 DIAGNOSIS — I10 Essential (primary) hypertension: Secondary | ICD-10-CM | POA: Diagnosis not present

## 2014-07-09 DIAGNOSIS — I509 Heart failure, unspecified: Secondary | ICD-10-CM | POA: Diagnosis present

## 2014-07-09 DIAGNOSIS — F419 Anxiety disorder, unspecified: Secondary | ICD-10-CM | POA: Diagnosis present

## 2014-07-09 DIAGNOSIS — I639 Cerebral infarction, unspecified: Secondary | ICD-10-CM | POA: Diagnosis not present

## 2014-07-09 DIAGNOSIS — Z91040 Latex allergy status: Secondary | ICD-10-CM | POA: Diagnosis not present

## 2014-07-09 DIAGNOSIS — I2510 Atherosclerotic heart disease of native coronary artery without angina pectoris: Secondary | ICD-10-CM | POA: Diagnosis present

## 2014-07-09 DIAGNOSIS — M6259 Muscle wasting and atrophy, not elsewhere classified, multiple sites: Secondary | ICD-10-CM | POA: Diagnosis present

## 2014-07-09 DIAGNOSIS — F329 Major depressive disorder, single episode, unspecified: Secondary | ICD-10-CM | POA: Diagnosis present

## 2014-07-09 DIAGNOSIS — M79605 Pain in left leg: Secondary | ICD-10-CM | POA: Diagnosis not present

## 2014-07-09 DIAGNOSIS — S32592A Other specified fracture of left pubis, initial encounter for closed fracture: Secondary | ICD-10-CM | POA: Diagnosis not present

## 2014-07-09 DIAGNOSIS — Z6824 Body mass index (BMI) 24.0-24.9, adult: Secondary | ICD-10-CM | POA: Diagnosis not present

## 2014-07-09 DIAGNOSIS — Z96642 Presence of left artificial hip joint: Secondary | ICD-10-CM | POA: Diagnosis present

## 2014-07-09 DIAGNOSIS — J45909 Unspecified asthma, uncomplicated: Secondary | ICD-10-CM | POA: Diagnosis present

## 2014-07-09 DIAGNOSIS — E039 Hypothyroidism, unspecified: Secondary | ICD-10-CM | POA: Diagnosis present

## 2014-07-09 DIAGNOSIS — Z048 Encounter for examination and observation for other specified reasons: Secondary | ICD-10-CM | POA: Diagnosis not present

## 2014-07-09 DIAGNOSIS — W19XXXA Unspecified fall, initial encounter: Secondary | ICD-10-CM | POA: Diagnosis not present

## 2014-07-09 DIAGNOSIS — Z881 Allergy status to other antibiotic agents status: Secondary | ICD-10-CM | POA: Diagnosis not present

## 2014-07-09 DIAGNOSIS — N179 Acute kidney failure, unspecified: Secondary | ICD-10-CM | POA: Diagnosis not present

## 2014-07-09 DIAGNOSIS — S32502A Unspecified fracture of left pubis, initial encounter for closed fracture: Secondary | ICD-10-CM | POA: Diagnosis not present

## 2014-07-09 DIAGNOSIS — Z7901 Long term (current) use of anticoagulants: Secondary | ICD-10-CM | POA: Diagnosis not present

## 2014-07-09 DIAGNOSIS — E1142 Type 2 diabetes mellitus with diabetic polyneuropathy: Secondary | ICD-10-CM | POA: Diagnosis not present

## 2014-07-09 DIAGNOSIS — E440 Moderate protein-calorie malnutrition: Secondary | ICD-10-CM | POA: Diagnosis not present

## 2014-07-09 DIAGNOSIS — E785 Hyperlipidemia, unspecified: Secondary | ICD-10-CM | POA: Diagnosis not present

## 2014-07-09 DIAGNOSIS — J449 Chronic obstructive pulmonary disease, unspecified: Secondary | ICD-10-CM | POA: Diagnosis not present

## 2014-07-09 DIAGNOSIS — R52 Pain, unspecified: Secondary | ICD-10-CM | POA: Diagnosis not present

## 2014-07-09 DIAGNOSIS — I638 Other cerebral infarction: Secondary | ICD-10-CM | POA: Diagnosis not present

## 2014-07-09 DIAGNOSIS — I4891 Unspecified atrial fibrillation: Secondary | ICD-10-CM | POA: Diagnosis present

## 2014-07-09 DIAGNOSIS — I6350 Cerebral infarction due to unspecified occlusion or stenosis of unspecified cerebral artery: Secondary | ICD-10-CM | POA: Diagnosis not present

## 2014-07-09 DIAGNOSIS — Z043 Encounter for examination and observation following other accident: Secondary | ICD-10-CM | POA: Diagnosis not present

## 2014-07-09 DIAGNOSIS — I4892 Unspecified atrial flutter: Secondary | ICD-10-CM | POA: Diagnosis not present

## 2014-07-09 DIAGNOSIS — M25552 Pain in left hip: Secondary | ICD-10-CM | POA: Diagnosis not present

## 2014-07-09 DIAGNOSIS — E119 Type 2 diabetes mellitus without complications: Secondary | ICD-10-CM | POA: Diagnosis not present

## 2014-07-09 DIAGNOSIS — Z955 Presence of coronary angioplasty implant and graft: Secondary | ICD-10-CM | POA: Diagnosis not present

## 2014-07-11 ENCOUNTER — Other Ambulatory Visit: Payer: Medicare Other

## 2014-07-12 DIAGNOSIS — S32502A Unspecified fracture of left pubis, initial encounter for closed fracture: Secondary | ICD-10-CM | POA: Diagnosis not present

## 2014-07-12 DIAGNOSIS — Z7409 Other reduced mobility: Secondary | ICD-10-CM | POA: Diagnosis not present

## 2014-07-12 DIAGNOSIS — S32592A Other specified fracture of left pubis, initial encounter for closed fracture: Secondary | ICD-10-CM | POA: Diagnosis not present

## 2014-07-12 DIAGNOSIS — Z955 Presence of coronary angioplasty implant and graft: Secondary | ICD-10-CM | POA: Diagnosis not present

## 2014-07-12 DIAGNOSIS — I129 Hypertensive chronic kidney disease with stage 1 through stage 4 chronic kidney disease, or unspecified chronic kidney disease: Secondary | ICD-10-CM | POA: Diagnosis not present

## 2014-07-12 DIAGNOSIS — Z794 Long term (current) use of insulin: Secondary | ICD-10-CM | POA: Diagnosis not present

## 2014-07-12 DIAGNOSIS — J45909 Unspecified asthma, uncomplicated: Secondary | ICD-10-CM | POA: Diagnosis present

## 2014-07-12 DIAGNOSIS — S3210XD Unspecified fracture of sacrum, subsequent encounter for fracture with routine healing: Secondary | ICD-10-CM | POA: Diagnosis not present

## 2014-07-12 DIAGNOSIS — N3281 Overactive bladder: Secondary | ICD-10-CM | POA: Diagnosis not present

## 2014-07-12 DIAGNOSIS — R6889 Other general symptoms and signs: Secondary | ICD-10-CM | POA: Diagnosis present

## 2014-07-12 DIAGNOSIS — E119 Type 2 diabetes mellitus without complications: Secondary | ICD-10-CM | POA: Diagnosis not present

## 2014-07-12 DIAGNOSIS — R32 Unspecified urinary incontinence: Secondary | ICD-10-CM | POA: Diagnosis present

## 2014-07-12 DIAGNOSIS — I482 Chronic atrial fibrillation: Secondary | ICD-10-CM | POA: Diagnosis not present

## 2014-07-12 DIAGNOSIS — I4891 Unspecified atrial fibrillation: Secondary | ICD-10-CM | POA: Diagnosis not present

## 2014-07-12 DIAGNOSIS — N179 Acute kidney failure, unspecified: Secondary | ICD-10-CM | POA: Diagnosis present

## 2014-07-12 DIAGNOSIS — E039 Hypothyroidism, unspecified: Secondary | ICD-10-CM | POA: Diagnosis not present

## 2014-07-12 DIAGNOSIS — I10 Essential (primary) hypertension: Secondary | ICD-10-CM | POA: Diagnosis not present

## 2014-07-12 DIAGNOSIS — Z883 Allergy status to other anti-infective agents status: Secondary | ICD-10-CM | POA: Diagnosis not present

## 2014-07-12 DIAGNOSIS — E785 Hyperlipidemia, unspecified: Secondary | ICD-10-CM | POA: Diagnosis not present

## 2014-07-12 DIAGNOSIS — Q600 Renal agenesis, unilateral: Secondary | ICD-10-CM | POA: Diagnosis not present

## 2014-07-12 DIAGNOSIS — N189 Chronic kidney disease, unspecified: Secondary | ICD-10-CM | POA: Diagnosis not present

## 2014-07-12 DIAGNOSIS — S32592D Other specified fracture of left pubis, subsequent encounter for fracture with routine healing: Secondary | ICD-10-CM | POA: Diagnosis not present

## 2014-07-12 DIAGNOSIS — Z91040 Latex allergy status: Secondary | ICD-10-CM | POA: Diagnosis not present

## 2014-07-12 DIAGNOSIS — S32110D Nondisplaced Zone I fracture of sacrum, subsequent encounter for fracture with routine healing: Secondary | ICD-10-CM | POA: Diagnosis not present

## 2014-07-12 DIAGNOSIS — I2510 Atherosclerotic heart disease of native coronary artery without angina pectoris: Secondary | ICD-10-CM | POA: Diagnosis present

## 2014-07-12 DIAGNOSIS — J449 Chronic obstructive pulmonary disease, unspecified: Secondary | ICD-10-CM | POA: Diagnosis not present

## 2014-07-16 ENCOUNTER — Encounter: Payer: Self-pay | Admitting: Family Medicine

## 2014-07-16 DIAGNOSIS — S32592A Other specified fracture of left pubis, initial encounter for closed fracture: Secondary | ICD-10-CM | POA: Insufficient documentation

## 2014-07-23 DIAGNOSIS — Z7409 Other reduced mobility: Secondary | ICD-10-CM | POA: Insufficient documentation

## 2014-07-31 ENCOUNTER — Ambulatory Visit: Payer: Medicare Other | Admitting: Family Medicine

## 2014-08-02 ENCOUNTER — Encounter: Payer: Self-pay | Admitting: Family Medicine

## 2014-08-02 ENCOUNTER — Ambulatory Visit (INDEPENDENT_AMBULATORY_CARE_PROVIDER_SITE_OTHER): Payer: Medicare Other | Admitting: Family Medicine

## 2014-08-02 VITALS — BP 117/59 | HR 77 | Temp 97.5°F | Wt 159.0 lb

## 2014-08-02 DIAGNOSIS — E1121 Type 2 diabetes mellitus with diabetic nephropathy: Secondary | ICD-10-CM

## 2014-08-02 DIAGNOSIS — N189 Chronic kidney disease, unspecified: Secondary | ICD-10-CM | POA: Diagnosis not present

## 2014-08-02 DIAGNOSIS — S32592A Other specified fracture of left pubis, initial encounter for closed fracture: Secondary | ICD-10-CM | POA: Diagnosis not present

## 2014-08-02 DIAGNOSIS — E1122 Type 2 diabetes mellitus with diabetic chronic kidney disease: Secondary | ICD-10-CM

## 2014-08-02 DIAGNOSIS — I10 Essential (primary) hypertension: Secondary | ICD-10-CM

## 2014-08-02 DIAGNOSIS — I701 Atherosclerosis of renal artery: Secondary | ICD-10-CM

## 2014-08-02 DIAGNOSIS — E1129 Type 2 diabetes mellitus with other diabetic kidney complication: Secondary | ICD-10-CM | POA: Insufficient documentation

## 2014-08-02 MED ORDER — OXYCODONE HCL 5 MG PO TABS: 5.0000 mg | ORAL_TABLET | Freq: Four times a day (QID) | ORAL | Status: AC | PRN

## 2014-08-02 NOTE — Progress Notes (Signed)
CC: Aritha Mostyn is a 78 y.o. female is here for hospital f/u   Subjective: HPI:  Hospital follow-up  For fracture of the left inferior pubic ramus. This injury occurred on the 30th of October. She describes that she he was standing still on a ramp and quickly change directions without looking and stepped off the ramp only to land on her rear end she is uncertain about how high of a fall this was. She did not hit her head and it sounds like her rear and took the full force of the fall. She was found to have the fracture above and was admitted due to the degree of pain and inability to transfer in and out of bed. She was seen by [facility] who did not believe any operative intervention was necessary and begin  Her in daily physical therapy. She returned home about a week ago and has physical therapy scheduled for next week. She believes that the pain has been plateaued since not doing daily physical therapy, it was improving well while taking physical therapy. She localizes the pain in the left buttock that radiates into the left groin. It is worse with lying on the left side and particularly worse with standing for longer than 10 minutes. It is improved with tramadol and a single dose of oxycodone at night. She currently denies any weakness in the left lower extremity and is using a walker because it helped somewhat with pain.  Follow-up essential hypertension: While in the hospital carvedilol was continued, amlodipine was added and lisinopril as well. fter addition of lisinopril her creatinine rose from 1.2 to 1.34. This is still actually lower than her creatinine when I saw her back in October. She was noted she should still be on these medications. She is checking her blood pressure at home with all values being in the prehypertensive range. She denies chest pain shortness of breath orthopnea nor peripheral edema  Also while in the hospital she was receiving every 4 hour NovoLog which she  did not take at home previously and her Lantus was increased to 22 units every day. Since being discharged home she has gone back to her 15 units of Lantus every day without any fast acting insulin with all fasting blood sugars being below 120. She wants to know what her current insulin regimen should be, she did not receive Januvia while in the hospital.    Review Of Systems Outlined In HPI  Past Medical History  Diagnosis Date  . Chronic renal insufficiency 02/26/2014  . COPD (chronic obstructive pulmonary disease) 02/26/2014  . Hypothyroid 02/26/2014  . Presence of stent in coronary artery in patient with coronary artery disease 02/26/2014  . Type 2 diabetes mellitus 02/26/2014  . Asthma   . CAD (coronary artery disease)   . Renal artery stenosis   . Atrial flutter   . Cerebrovascular disease     Past Surgical History  Procedure Laterality Date  . Coronary artery bypass graft      2004, [place]  . Hip replacement    . Knee surgery Bilateral   . Back surgery    . Cholecystectomy    . Tonsillectomy    . Appendectomy    . Abdominal hysterectomy    . Shoulder surgery    . Nasal sinus surgery     Family History  Problem Relation Age of Onset  . CAD Father     History   Social History  . Marital Status: Married  Spouse Name: N/A    Number of Children: 4  . Years of Education: N/A   Occupational History  . Not on file.   Social History Main Topics  . Smoking status: Never Smoker   . Smokeless tobacco: Not on file  . Alcohol Use: No  . Drug Use: No  . Sexual Activity: No   Other Topics Concern  . Not on file   Social History Narrative     Objective: BP 117/59 mmHg  Pulse 77  Temp(Src) 97.5 F (36.4 C) (Oral)  Wt 159 lb (72.122 kg)  General: Alert and Oriented, No Acute Distress HEENT: Pupils equal, round, reactive to light. Conjunctivae clear.  Moist mucous membranes pharynx unremarkable Lungs: Clear to auscultation bilaterally, no wheezing/ronchi/rales.   Comfortable work of breathing. Good air movement. Cardiac: Regular rate and rhythm. Normal S1/S2.  No murmurs, rubs, nor gallops.   Extremities: No peripheral edema.  Strong peripheral pulses. Full range of motion and strength of the left lower extremity, using walker for stability while ambulating. No difficulty with transferring from seated to standing position Mental Status: No depression, anxiety, nor agitation. Skin: Warm and dry.  Assessment & Plan: Elyzah was seen today for hospital f/u.  Diagnoses and associated orders for this visit:  Fracture of left inferior pubic ramus, closed, initial encounter - oxyCODONE (ROXICODONE) 5 MG immediate release tablet; Take 1 tablet (5 mg total) by mouth every 6 (six) hours as needed for severe pain.  Essential hypertension, benign  Type 2 diabetes mellitus with diabetic nephropathy  Type 2 diabetes mellitus with diabetic chronic kidney disease    Fracture of the left inferior pubic ramus:improving, discussed that I think her improvement will be much faster once she restarts physical therapy and if there is any delay beyond next week for physical therapy to call me so I can arrange physical therapy here within our office. Essential hypertension: Controlled on new lisinopril and amlodipine. Continue these medications along with former regimen of carvedilol Type 2 diabetes: Controlled on former regimen of Lantus 15 units daily and Januvia. I discussed with her that her elevated blood sugars while in the hospital is probably due to a mix of not being on Januvia and due to spending the majority of her time in bed  40 minutes spent face-to-face during visit today of which at least 50% was counseling or coordinating care regarding: 1. Fracture of left inferior pubic ramus, closed, initial encounter   2. Essential hypertension, benign   3. Type 2 diabetes mellitus with diabetic nephropathy   4. Type 2 diabetes mellitus with diabetic chronic kidney  disease   and reviewing outside records and the presence of the patient    Return in about 3 months (around 11/02/2014) for Blood Pressure.

## 2014-08-05 ENCOUNTER — Other Ambulatory Visit: Payer: Self-pay | Admitting: Family Medicine

## 2014-08-06 DIAGNOSIS — Z794 Long term (current) use of insulin: Secondary | ICD-10-CM | POA: Diagnosis not present

## 2014-08-06 DIAGNOSIS — J45909 Unspecified asthma, uncomplicated: Secondary | ICD-10-CM | POA: Diagnosis not present

## 2014-08-06 DIAGNOSIS — Z7901 Long term (current) use of anticoagulants: Secondary | ICD-10-CM | POA: Diagnosis not present

## 2014-08-06 DIAGNOSIS — Z4789 Encounter for other orthopedic aftercare: Secondary | ICD-10-CM | POA: Diagnosis not present

## 2014-08-06 DIAGNOSIS — J449 Chronic obstructive pulmonary disease, unspecified: Secondary | ICD-10-CM | POA: Diagnosis not present

## 2014-08-06 DIAGNOSIS — E119 Type 2 diabetes mellitus without complications: Secondary | ICD-10-CM | POA: Diagnosis not present

## 2014-08-06 DIAGNOSIS — S32592A Other specified fracture of left pubis, initial encounter for closed fracture: Secondary | ICD-10-CM | POA: Diagnosis not present

## 2014-08-06 DIAGNOSIS — Z888 Allergy status to other drugs, medicaments and biological substances status: Secondary | ICD-10-CM | POA: Diagnosis not present

## 2014-08-06 DIAGNOSIS — Z91040 Latex allergy status: Secondary | ICD-10-CM | POA: Diagnosis not present

## 2014-08-06 DIAGNOSIS — Z96642 Presence of left artificial hip joint: Secondary | ICD-10-CM | POA: Diagnosis not present

## 2014-08-06 DIAGNOSIS — I4891 Unspecified atrial fibrillation: Secondary | ICD-10-CM | POA: Diagnosis not present

## 2014-08-13 DIAGNOSIS — M545 Low back pain: Secondary | ICD-10-CM | POA: Diagnosis not present

## 2014-08-13 DIAGNOSIS — R269 Unspecified abnormalities of gait and mobility: Secondary | ICD-10-CM | POA: Diagnosis not present

## 2014-08-13 DIAGNOSIS — S3289XS Fracture of other parts of pelvis, sequela: Secondary | ICD-10-CM | POA: Diagnosis not present

## 2014-08-13 DIAGNOSIS — M6281 Muscle weakness (generalized): Secondary | ICD-10-CM | POA: Diagnosis not present

## 2014-08-15 DIAGNOSIS — S3289XS Fracture of other parts of pelvis, sequela: Secondary | ICD-10-CM | POA: Diagnosis not present

## 2014-08-15 DIAGNOSIS — R269 Unspecified abnormalities of gait and mobility: Secondary | ICD-10-CM | POA: Diagnosis not present

## 2014-08-15 DIAGNOSIS — M545 Low back pain: Secondary | ICD-10-CM | POA: Diagnosis not present

## 2014-08-15 DIAGNOSIS — M6281 Muscle weakness (generalized): Secondary | ICD-10-CM | POA: Diagnosis not present

## 2014-08-19 DIAGNOSIS — S3289XS Fracture of other parts of pelvis, sequela: Secondary | ICD-10-CM | POA: Diagnosis not present

## 2014-08-19 DIAGNOSIS — M6281 Muscle weakness (generalized): Secondary | ICD-10-CM | POA: Diagnosis not present

## 2014-08-19 DIAGNOSIS — M545 Low back pain: Secondary | ICD-10-CM | POA: Diagnosis not present

## 2014-08-19 DIAGNOSIS — R269 Unspecified abnormalities of gait and mobility: Secondary | ICD-10-CM | POA: Diagnosis not present

## 2014-08-22 ENCOUNTER — Ambulatory Visit
Admission: RE | Admit: 2014-08-22 | Discharge: 2014-08-22 | Disposition: A | Discharge status: Home / Self Care | Payer: Medicare Other | Source: Ambulatory Visit | Attending: Neurological Surgery | Admitting: Neurological Surgery

## 2014-08-22 DIAGNOSIS — S32119A Unspecified Zone I fracture of sacrum, initial encounter for closed fracture: Secondary | ICD-10-CM | POA: Diagnosis not present

## 2014-08-22 DIAGNOSIS — M4802 Spinal stenosis, cervical region: Secondary | ICD-10-CM | POA: Diagnosis not present

## 2014-08-22 DIAGNOSIS — S32020A Wedge compression fracture of second lumbar vertebra, initial encounter for closed fracture: Secondary | ICD-10-CM | POA: Diagnosis not present

## 2014-08-22 DIAGNOSIS — M4316 Spondylolisthesis, lumbar region: Secondary | ICD-10-CM | POA: Diagnosis not present

## 2014-08-23 ENCOUNTER — Other Ambulatory Visit: Payer: Self-pay | Admitting: *Deleted

## 2014-08-23 MED ORDER — LISINOPRIL 10 MG PO TABS: ORAL_TABLET | ORAL | Status: DC

## 2014-08-26 ENCOUNTER — Other Ambulatory Visit: Payer: Self-pay | Admitting: Family Medicine

## 2014-08-27 ENCOUNTER — Encounter: Payer: Self-pay | Admitting: Family Medicine

## 2014-08-27 ENCOUNTER — Telehealth: Payer: Self-pay | Admitting: Family Medicine

## 2014-08-27 ENCOUNTER — Ambulatory Visit (INDEPENDENT_AMBULATORY_CARE_PROVIDER_SITE_OTHER): Payer: Medicare Other | Admitting: Family Medicine

## 2014-08-27 ENCOUNTER — Telehealth: Payer: Self-pay | Admitting: *Deleted

## 2014-08-27 VITALS — BP 125/55 | HR 79 | Wt 165.0 lb

## 2014-08-27 DIAGNOSIS — R609 Edema, unspecified: Secondary | ICD-10-CM

## 2014-08-27 DIAGNOSIS — I701 Atherosclerosis of renal artery: Secondary | ICD-10-CM | POA: Diagnosis not present

## 2014-08-27 DIAGNOSIS — R0600 Dyspnea, unspecified: Secondary | ICD-10-CM

## 2014-08-27 DIAGNOSIS — M25473 Effusion, unspecified ankle: Secondary | ICD-10-CM

## 2014-08-27 MED ORDER — LISINOPRIL 10 MG PO TABS: ORAL_TABLET | ORAL | Status: DC

## 2014-08-27 MED ORDER — FUROSEMIDE 20 MG PO TABS: ORAL_TABLET | ORAL | Status: DC

## 2014-08-27 NOTE — Telephone Encounter (Signed)
Ok then as we discussed next step is starting furosemide and getting an echocardiogram.  Furosemide sent to [facility], can yo uplease ensure that the echocardiogram gets scheduled in [place] (order in inbox)

## 2014-08-27 NOTE — Progress Notes (Signed)
CC: Briana Werner is a 78 y.o. female is here for legs hurt/edema   Subjective: HPI:  Complains of bilateral ankle swelling that has been present for about a month now. Over the past week it seems to be getting much worse and has become red, warm, and painful to the touch. Nothing particularly has made it better or worse. It is present all hours of the day. It does not improve with elevation of the legs and it does not worsen if she is on her feet.  She denies swelling elsewhere. She's had some shortness of breath when lying down however this is improved with using albuterol. She denies any chest pain, racing heart, cough, nor any other pulmonary complaints. She denies any unilateral swelling and she's had no lapse of taking elliquis twice a day.  She denies fevers, chills, nor skin changes elsewhere.   Review Of Systems Outlined In HPI  Past Medical History  Diagnosis Date  . Chronic renal insufficiency 02/26/2014  . COPD (chronic obstructive pulmonary disease) 02/26/2014  . Hypothyroid 02/26/2014  . Presence of stent in coronary artery in patient with coronary artery disease 02/26/2014  . Type 2 diabetes mellitus 02/26/2014  . Asthma   . CAD (coronary artery disease)   . Renal artery stenosis   . Atrial flutter   . Cerebrovascular disease     Past Surgical History  Procedure Laterality Date  . Coronary artery bypass graft      2004, [place]  . Hip replacement    . Knee surgery Bilateral   . Back surgery    . Cholecystectomy    . Tonsillectomy    . Appendectomy    . Abdominal hysterectomy    . Shoulder surgery    . Nasal sinus surgery     Family History  Problem Relation Age of Onset  . CAD Father     History   Social History  . Marital Status: Married    Spouse Name: N/A    Number of Children: 4  . Years of Education: N/A   Occupational History  . Not on file.   Social History Main Topics  . Smoking status: Never Smoker   . Smokeless tobacco: Not on file  .  Alcohol Use: No  . Drug Use: No  . Sexual Activity: No   Other Topics Concern  . Not on file   Social History Narrative     Objective: BP 125/55 mmHg  Pulse 79  Wt 165 lb (74.844 kg)  SpO2 94%  General: Alert and Oriented, No Acute Distress HEENT: Pupils equal, round, reactive to light. Conjunctivae clear.  Moist mucous membranes Lungs: Clear to auscultation bilaterally, no wheezing/ronchi/rales.  Comfortable work of breathing. Good air movement. Cardiac: Irregularly irregular heartbeat at a rate of less than 100 bpm. Normal S1/S2.  No murmurs, rubs, nor gallops.   Extremities: 1+ pitting edema bilaterally with mild erythema involving the dorsal surface of the foot in the ankles.  Strong peripheral pulses.  Mental Status: No depression, anxiety, nor agitation. Skin: Warm and dry.  Assessment & Plan: [name] was seen today for legs hurt/edema.  Diagnoses and associated orders for this visit:  Ankle edema  Other Orders - lisinopril (PRINIVIL,ZESTRIL) 10 MG tablet; Take 1 tablet by mouth daily    Ankle edema: It appears that this swelling began when she was in the hospital for her pubic ramus fracture. During this hospitalization she was also started on amlodipine which she continues to take on a daily  basis. I strongly suspect that this is a side effect of amlodipine therefore stop this immediately and return on Monday for blood pressure check and to recheck swelling. If no improvement we'll begin diuretics and obtain an echocardiogram.   Return in about 6 days (around 09/02/2014), or Monday.

## 2014-08-27 NOTE — Telephone Encounter (Signed)
Pt called and left a message that she is not taking amlodipine 10mg 

## 2014-08-27 NOTE — Telephone Encounter (Signed)
Error

## 2014-08-28 NOTE — Telephone Encounter (Signed)
Pt.notified

## 2014-08-28 NOTE — Telephone Encounter (Signed)
Put sticky on order to please schedule in [facility] at [facility] and put in referral folder

## 2014-08-29 DIAGNOSIS — M4316 Spondylolisthesis, lumbar region: Secondary | ICD-10-CM | POA: Diagnosis not present

## 2014-08-29 DIAGNOSIS — Z6826 Body mass index (BMI) 26.0-26.9, adult: Secondary | ICD-10-CM | POA: Diagnosis not present

## 2014-09-02 ENCOUNTER — Encounter: Payer: Self-pay | Admitting: Family Medicine

## 2014-09-02 ENCOUNTER — Ambulatory Visit (INDEPENDENT_AMBULATORY_CARE_PROVIDER_SITE_OTHER): Payer: Medicare Other | Admitting: Family Medicine

## 2014-09-02 VITALS — BP 132/55 | HR 89 | Wt 161.0 lb

## 2014-09-02 DIAGNOSIS — R609 Edema, unspecified: Secondary | ICD-10-CM | POA: Diagnosis not present

## 2014-09-02 DIAGNOSIS — I701 Atherosclerosis of renal artery: Secondary | ICD-10-CM | POA: Diagnosis not present

## 2014-09-02 DIAGNOSIS — M4310 Spondylolisthesis, site unspecified: Secondary | ICD-10-CM | POA: Diagnosis not present

## 2014-09-02 DIAGNOSIS — E1121 Type 2 diabetes mellitus with diabetic nephropathy: Secondary | ICD-10-CM

## 2014-09-02 MED ORDER — OXYCODONE-ACETAMINOPHEN 10-325 MG PO TABS
1.0000 | ORAL_TABLET | Freq: Three times a day (TID) | ORAL | Status: DC | PRN
Start: 2014-09-02 — End: 2014-10-09

## 2014-09-02 NOTE — Progress Notes (Signed)
CC: Briana Werner is a 78 y.o. female is here for f/u ankle edema   Subjective: HPI:   follow-up type 2 diabetes: sugars have been ranging below 100 in a fasting state  With no postprandials or any other random blood sugars to report. She continues on  Januvia 50 mg daily.   Denies polyuria or polyphagia or polydipsia. No poorly healing wounds   She tells me that her neurosurgeon has  Determined that she is not a surgical candidate for her spondylolisthesis and chronic back pain. She continues to have  Moderate to severe midline back pain with occasional radiation down the left leg. Symptoms have not gotten better or worse since I obtained an x-ray earlier this year. Symptoms are slightly improved with oxycodone that she takes twice a day.   Her neurosurgeon has told her that  Pain management is the only strategy that should be explored for her back pain.   follow-up lower extremity edema: She's only taken 2 doses of Lasix since I saw her last.  She does not  Like the idea that it'll make her have to urinate more than normal. She reports the swelling has improved and pain is now only mild in severity. She denies edema or swelling elsewhere.   Review Of Systems Outlined In HPI  Past Medical History  Diagnosis Date  . Chronic renal insufficiency 02/26/2014  . COPD (chronic obstructive pulmonary disease) 02/26/2014  . Hypothyroid 02/26/2014  . Presence of stent in coronary artery in patient with coronary artery disease 02/26/2014  . Type 2 diabetes mellitus 02/26/2014  . Asthma   . CAD (coronary artery disease)   . Renal artery stenosis   . Atrial flutter   . Cerebrovascular disease     Past Surgical History  Procedure Laterality Date  . Coronary artery bypass graft      2004, [place]  . Hip replacement    . Knee surgery Bilateral   . Back surgery    . Cholecystectomy    . Tonsillectomy    . Appendectomy    . Abdominal hysterectomy    . Shoulder surgery    . Nasal sinus surgery      Family History  Problem Relation Age of Onset  . CAD Father     History   Social History  . Marital Status: Married    Spouse Name: N/A    Number of Children: 4  . Years of Education: N/A   Occupational History  . Not on file.   Social History Main Topics  . Smoking status: Never Smoker   . Smokeless tobacco: Not on file  . Alcohol Use: No  . Drug Use: No  . Sexual Activity: No   Other Topics Concern  . Not on file   Social History Narrative     Objective: BP 132/55 mmHg  Pulse 89  Wt 161 lb (73.029 kg)  General: Alert and Oriented, No Acute Distress HEENT: Pupils equal, round, reactive to light. Conjunctivae clear.   Moist mucous membranes Lungs:  Clear comfortable work of breathing Cardiac:  Irregularly irregular rhythm at a rate less than 100 bpm Extremities:  1+ pitting edema in both ankles with no overlying erythema.  Strong peripheral pulses.  Mental Status: No depression, anxiety, nor agitation. Skin: Warm and dry.  Assessment & Plan: Marbella was seen today for f/u ankle edema.  Diagnoses and associated orders for this visit:  Type 2 diabetes mellitus with diabetic nephropathy - Hemoglobin A1c  Spondylisthesis - oxyCODONE-acetaminophen (  PERCOCET) 10-325 MG per tablet; Take 1 tablet by mouth every 8 (eight) hours as needed for pain.  Edema     type 2 diabetes: Clinically controlled but due for A1c continue Januvia pending results  Spondylolisthesis: awaiting records from her neurosurgeon, discussed that I be willing to help with pain management  For this condition since she is not a surgical candidate, increase oxycodone to 3 times a day as needed  Edema: Improving continue as needed Lasix, advised to start wearing compression stockings bilaterally and  To follow through with echocardiogram that has been ordered for January  Return if symptoms worsen or fail to improve.

## 2014-09-03 LAB — HEMOGLOBIN A1C
HEMOGLOBIN A1C: 6.5 % — AB (ref <5.7)
Mean Plasma Glucose: 140 mg/dL — ABNORMAL HIGH (ref <117)

## 2014-09-18 ENCOUNTER — Ambulatory Visit (HOSPITAL_BASED_OUTPATIENT_CLINIC_OR_DEPARTMENT_OTHER)
Admission: RE | Admit: 2014-09-18 | Discharge: 2014-09-18 | Disposition: A | Discharge status: Home / Self Care | Payer: Medicare Other | Source: Ambulatory Visit | Attending: Family Medicine | Admitting: Family Medicine

## 2014-09-18 DIAGNOSIS — I509 Heart failure, unspecified: Secondary | ICD-10-CM | POA: Diagnosis not present

## 2014-09-18 DIAGNOSIS — I10 Essential (primary) hypertension: Secondary | ICD-10-CM | POA: Insufficient documentation

## 2014-09-18 DIAGNOSIS — I2510 Atherosclerotic heart disease of native coronary artery without angina pectoris: Secondary | ICD-10-CM | POA: Diagnosis not present

## 2014-09-18 DIAGNOSIS — R609 Edema, unspecified: Secondary | ICD-10-CM

## 2014-09-18 DIAGNOSIS — R0600 Dyspnea, unspecified: Secondary | ICD-10-CM | POA: Insufficient documentation

## 2014-09-18 DIAGNOSIS — J449 Chronic obstructive pulmonary disease, unspecified: Secondary | ICD-10-CM | POA: Insufficient documentation

## 2014-09-18 DIAGNOSIS — I351 Nonrheumatic aortic (valve) insufficiency: Secondary | ICD-10-CM | POA: Insufficient documentation

## 2014-09-18 DIAGNOSIS — I359 Nonrheumatic aortic valve disorder, unspecified: Secondary | ICD-10-CM

## 2014-09-25 ENCOUNTER — Other Ambulatory Visit: Payer: Self-pay | Admitting: Family Medicine

## 2014-09-27 ENCOUNTER — Other Ambulatory Visit: Payer: Self-pay | Admitting: *Deleted

## 2014-09-27 MED ORDER — INTEGRA PLUS PO CAPS
ORAL_CAPSULE | ORAL | Status: DC
Start: 2014-09-27 — End: 2015-10-05

## 2014-09-30 ENCOUNTER — Other Ambulatory Visit: Payer: Self-pay | Admitting: Family Medicine

## 2014-10-08 DIAGNOSIS — S32592D Other specified fracture of left pubis, subsequent encounter for fracture with routine healing: Secondary | ICD-10-CM | POA: Diagnosis not present

## 2014-10-08 DIAGNOSIS — Z4789 Encounter for other orthopedic aftercare: Secondary | ICD-10-CM | POA: Diagnosis not present

## 2014-10-08 DIAGNOSIS — S32810D Multiple fractures of pelvis with stable disruption of pelvic ring, subsequent encounter for fracture with routine healing: Secondary | ICD-10-CM | POA: Diagnosis not present

## 2014-10-09 ENCOUNTER — Encounter: Payer: Self-pay | Admitting: Family Medicine

## 2014-10-09 ENCOUNTER — Other Ambulatory Visit: Payer: Self-pay | Admitting: *Deleted

## 2014-10-09 ENCOUNTER — Ambulatory Visit (INDEPENDENT_AMBULATORY_CARE_PROVIDER_SITE_OTHER): Payer: Medicare Other | Admitting: Family Medicine

## 2014-10-09 VITALS — BP 134/68 | HR 61 | Temp 97.4°F | Wt 160.0 lb

## 2014-10-09 DIAGNOSIS — M4310 Spondylolisthesis, site unspecified: Secondary | ICD-10-CM | POA: Diagnosis not present

## 2014-10-09 DIAGNOSIS — J441 Chronic obstructive pulmonary disease with (acute) exacerbation: Secondary | ICD-10-CM | POA: Diagnosis not present

## 2014-10-09 MED ORDER — AMBULATORY NON FORMULARY MEDICATION: Status: DC

## 2014-10-09 MED ORDER — CEFDINIR 300 MG PO CAPS: 300.0000 mg | ORAL_CAPSULE | Freq: Two times a day (BID) | ORAL | Status: AC

## 2014-10-09 MED ORDER — OXYCODONE-ACETAMINOPHEN 10-325 MG PO TABS: 1.0000 | ORAL_TABLET | Freq: Three times a day (TID) | ORAL | Status: DC | PRN

## 2014-10-09 NOTE — Progress Notes (Signed)
CC: Briana Werner is a 79 y.o. female is here for cough and congestion   Subjective: HPI:  Complaints of cough that is nonproductive that has been present for the past 3 days. Accompanied by facial pressure in the forehead and beneath the cheeks. Facial pain worse with leaning forward. Her cough is slightly improved with albuterol however it seems that the albuterol is less responsive as it has been in the past. Accompanied by fatigue and chills. She has sensation that something is sitting on her chest however this is resolved for a few hours after using albuterol, there is no exertional component to this chest discomfort. She denies wheezing, fever, blood in sputum, nausea, diaphoresis, nor irregular heartbeat.   Review Of Systems Outlined In HPI  Past Medical History  Diagnosis Date  . Chronic renal insufficiency 02/26/2014  . COPD (chronic obstructive pulmonary disease) 02/26/2014  . Hypothyroid 02/26/2014  . Presence of stent in coronary artery in patient with coronary artery disease 02/26/2014  . Type 2 diabetes mellitus 02/26/2014  . Asthma   . CAD (coronary artery disease)   . Renal artery stenosis   . Atrial flutter   . Cerebrovascular disease     Past Surgical History  Procedure Laterality Date  . Coronary artery bypass graft      2004, [place]  . Hip replacement    . Knee surgery Bilateral   . Back surgery    . Cholecystectomy    . Tonsillectomy    . Appendectomy    . Abdominal hysterectomy    . Shoulder surgery    . Nasal sinus surgery     Family History  Problem Relation Age of Onset  . CAD Father     History   Social History  . Marital Status: Married    Spouse Name: N/A    Number of Children: 4  . Years of Education: N/A   Occupational History  . Not on file.   Social History Main Topics  . Smoking status: Never Smoker   . Smokeless tobacco: Not on file  . Alcohol Use: No  . Drug Use: No  . Sexual Activity: No   Other Topics Concern  . Not on file    Social History Narrative     Objective: BP 134/68 mmHg  Pulse 61  Temp(Src) 97.4 F (36.3 C) (Oral)  Wt 160 lb (72.576 kg)  SpO2 94%  General: Alert and Oriented, No Acute Distress HEENT: Pupils equal, round, reactive to light. Conjunctivae clear.  External ears unremarkable, canals clear with intact TMs with appropriate landmarks.  Middle ear appears open without effusion. Pink inferior turbinates.  Moist mucous membranes, pharynx without inflammation nor lesions.  Neck supple without palpable lymphadenopathy nor abnormal masses. Lungs: Clear to auscultation bilaterally, no wheezing/ronchi/rales.  Comfortable work of breathing. Good air movement. Cardiac: Regular rate and rhythm. Normal S1/S2.  No murmurs, rubs, nor gallops.   Extremities: No peripheral edema.  Strong peripheral pulses.  Mental Status: No depression, anxiety, nor agitation. Skin: Warm and dry.  Assessment & Plan: Briana Werner was seen today for cough and congestion.  Diagnoses and associated orders for this visit:  Spondylisthesis - oxyCODONE-acetaminophen (PERCOCET) 10-325 MG per tablet; Take 1 tablet by mouth every 8 (eight) hours as needed for pain.  COPD exacerbation  Other Orders - cefdinir (OMNICEF) 300 MG capsule; Take 1 capsule (300 mg total) by mouth 2 (two) times daily.    COPD exacerbation: Continue albuterol however schedule 2 puffs every 4 hours for  the next 48 hours then as needed. Since she is not wheezing and would hold off on steroids unless she has no benefit from [facility] after the next 48 hours. Continue Advair and Singulair. She's requesting refills on oxycodone for her spondylolisthesis, denies any new change in the character or severity of her back pain  25 minutes spent face-to-face during visit today of which at least 50% was counseling or coordinating care regarding: 1. Spondylisthesis   2. COPD exacerbation      Return if symptoms worsen or fail to improve.

## 2014-10-10 ENCOUNTER — Telehealth: Payer: Self-pay | Admitting: *Deleted

## 2014-10-10 NOTE — Telephone Encounter (Signed)
Pt called and states [facility] had the wrong directions on her eloquis. Directions state take 0.5 tab mouth twice daily. Pt takes 1 whole tan by mouth twice daily.advised to call Dr. Lonia Skinner office to see if they can handle this since they are managing this med

## 2014-10-11 ENCOUNTER — Telehealth: Payer: Self-pay | Admitting: Cardiology

## 2014-10-11 DIAGNOSIS — I82409 Acute embolism and thrombosis of unspecified deep veins of unspecified lower extremity: Secondary | ICD-10-CM

## 2014-10-11 NOTE — Telephone Encounter (Signed)
Wife not in, husband asked for [place] to call back Monday, will defer to [facility].

## 2014-10-11 NOTE — Telephone Encounter (Signed)
Briana Werner is calling because she wants to speak about her Eliquis medication .Marland Kitchen Please call   Thanks

## 2014-10-15 MED ORDER — APIXABAN 5 MG PO TABS: 5.0000 mg | ORAL_TABLET | Freq: Two times a day (BID) | ORAL | Status: DC

## 2014-10-15 NOTE — Telephone Encounter (Signed)
Spoke with pt, new script sent to the pharmacy.

## 2014-10-21 ENCOUNTER — Other Ambulatory Visit: Payer: Self-pay | Admitting: Family Medicine

## 2014-11-11 ENCOUNTER — Other Ambulatory Visit: Payer: Self-pay | Admitting: Family Medicine

## 2014-11-20 ENCOUNTER — Encounter: Payer: Self-pay | Admitting: Family Medicine

## 2014-11-20 ENCOUNTER — Ambulatory Visit (INDEPENDENT_AMBULATORY_CARE_PROVIDER_SITE_OTHER): Payer: Medicare Other | Admitting: Family Medicine

## 2014-11-20 ENCOUNTER — Ambulatory Visit (INDEPENDENT_AMBULATORY_CARE_PROVIDER_SITE_OTHER): Payer: Medicare Other

## 2014-11-20 VITALS — BP 142/74 | HR 79 | Wt 164.0 lb

## 2014-11-20 DIAGNOSIS — M5137 Other intervertebral disc degeneration, lumbosacral region: Secondary | ICD-10-CM

## 2014-11-20 DIAGNOSIS — M4316 Spondylolisthesis, lumbar region: Secondary | ICD-10-CM

## 2014-11-20 DIAGNOSIS — M4856XD Collapsed vertebra, not elsewhere classified, lumbar region, subsequent encounter for fracture with routine healing: Secondary | ICD-10-CM

## 2014-11-20 DIAGNOSIS — M545 Low back pain, unspecified: Secondary | ICD-10-CM

## 2014-11-20 DIAGNOSIS — S32020D Wedge compression fracture of second lumbar vertebra, subsequent encounter for fracture with routine healing: Secondary | ICD-10-CM | POA: Diagnosis not present

## 2014-11-20 DIAGNOSIS — M4310 Spondylolisthesis, site unspecified: Secondary | ICD-10-CM

## 2014-11-20 DIAGNOSIS — S76112A Strain of left quadriceps muscle, fascia and tendon, initial encounter: Secondary | ICD-10-CM

## 2014-11-20 DIAGNOSIS — M5136 Other intervertebral disc degeneration, lumbar region: Secondary | ICD-10-CM

## 2014-11-20 MED ORDER — OXYCODONE-ACETAMINOPHEN 10-325 MG PO TABS: 0.5000 | ORAL_TABLET | Freq: Four times a day (QID) | ORAL | Status: DC | PRN

## 2014-11-20 NOTE — Progress Notes (Signed)
CC: Briana Werner is a 79 y.o. female is here for Back Pain   Subjective: HPI:  Complains of midline low back pain localized at the base of the spine. Pain is nonradiating. She's had chronic pain here for a matter of months however pain declined abruptly after she fell from a standing position approximately 10 days ago. She describes tripping backwards and going into a seated motion where she landed on her buttocks and then hit her right knee. She required the assistance of others to get up. Since then she has had persistent moderate pain in the midline that is worse when getting up from a seated to standing position or sitting for long periods of time. Denies any bowel or bladder incontinence or saddle paresthesia. No new weakness in the lower extremities. No position seems to make the symptoms better or worse but it does improve with leftover oxycodone.  She also tells me that she's had left thigh pain that has been present ever since the time of the fall. It's localized just above the kneecap and seems to gradually radiate up into the top of the anterior hip just above the groin. It is also worse getting up from a seated position. She denies any overlying skin changes redness warmth or bruising in the left lower extremity. Sitting seems to help her pain here, other than above nothing seems to make it better or worse. She denies any mechanical symptoms such as locking catching or giving way of the knee. She denies knee pain per se.   She tells me pain was slowly improving for the first few days but has plateaued over the past 5 days.  Review Of Systems Outlined In HPI  Past Medical History  Diagnosis Date  . Chronic renal insufficiency 02/26/2014  . COPD (chronic obstructive pulmonary disease) 02/26/2014  . Hypothyroid 02/26/2014  . Presence of stent in coronary artery in patient with coronary artery disease 02/26/2014  . Type 2 diabetes mellitus 02/26/2014  . Asthma   . CAD (coronary artery  disease)   . Renal artery stenosis   . Atrial flutter   . Cerebrovascular disease     Past Surgical History  Procedure Laterality Date  . Coronary artery bypass graft      2004, [place]  . Hip replacement    . Knee surgery Bilateral   . Back surgery    . Cholecystectomy    . Tonsillectomy    . Appendectomy    . Abdominal hysterectomy    . Shoulder surgery    . Nasal sinus surgery     Family History  Problem Relation Age of Onset  . CAD Father     History   Social History  . Marital Status: Married    Spouse Name: N/A  . Number of Children: 4  . Years of Education: N/A   Occupational History  . Not on file.   Social History Main Topics  . Smoking status: Never Smoker   . Smokeless tobacco: Not on file  . Alcohol Use: No  . Drug Use: No  . Sexual Activity: No   Other Topics Concern  . Not on file   Social History Narrative     Objective: BP 142/74 mmHg  Pulse 79  Wt 164 lb (74.39 kg)  Vital signs reviewed. General: Alert and Oriented, No Acute Distress HEENT: Pupils equal, round, reactive to light. Conjunctivae clear.  External ears unremarkable.  Moist mucous membranes. Lungs: Clear and comfortable work of breathing, speaking in  full sentences without accessory muscle use. Cardiac: Regular rate and rhythm.  Neuro: CN II-XII grossly intact Left knee exam shows full-strength and range of motion. There is no swelling, redness, nor warmth overlying the knee.  No patellar crepitus. No patellar apprehension. No pain with palpation of the inferior patellar pole, pain is slightly reproduced with palpation of the insertion of the quadriceps on the patella.  No pain or laxity with valgus nor varus stress. Anterior drawer is negative. McMurray's negative. No popliteal space tenderness or palpable mass. No medial or lateral joint line tenderness to palpation. Back: Mild reproduction of her low back pain with palpation of the L5 spinous process. No paraspinal musculature  tenderness with palpation nor midline tenderness elsewhere in the lumbar spine. Full range of motion in all 3 planes without exacerbation of her pain, no overlying skin changes at the site of her discomfort. Full range of motion and strength of both lower extremities Extremities: No peripheral edema.  Strong peripheral pulses.  Mental Status: No depression, anxiety, nor agitation. Logical though process. Skin: Warm and dry.  Assessment & Plan: Briana Werner was seen today for back pain.  Diagnoses and all orders for this visit:  Spondylisthesis Orders: -     oxyCODONE-acetaminophen (PERCOCET) 10-325 MG per tablet; Take 0.5-1 tablets by mouth every 6 (six) hours as needed for pain.  Midline low back pain without sciatica Orders: -     DG Lumbar Spine Complete; Future  Quadriceps strain, left, initial encounter   History of spondylolisthesis with worsening midline low back pain after a fall. Check an x-ray today to ensure no compression fracture or worsening slippage. Oxycodone for pain Strain of the left quadriceps. Discussed that this should improve on its own with rest and gentle range of motion exercises. Ultimate follow-up and plan will be based on the results of the x-ray above  40 minutes spent face-to-face during visit today of which at least 50% was counseling or coordinating care regarding: 1. Spondylisthesis   2. Midline low back pain without sciatica   3. Quadriceps strain, left, initial encounter      Return if symptoms worsen or fail to improve.

## 2014-11-27 ENCOUNTER — Other Ambulatory Visit: Payer: Self-pay | Admitting: *Deleted

## 2014-11-27 DIAGNOSIS — I6523 Occlusion and stenosis of bilateral carotid arteries: Secondary | ICD-10-CM

## 2014-11-28 ENCOUNTER — Other Ambulatory Visit: Payer: Self-pay | Admitting: Family Medicine

## 2014-11-28 MED ORDER — MONTELUKAST SODIUM 10 MG PO TABS: 10.0000 mg | ORAL_TABLET | Freq: Every day | ORAL | Status: DC

## 2014-11-29 DIAGNOSIS — I6523 Occlusion and stenosis of bilateral carotid arteries: Secondary | ICD-10-CM | POA: Diagnosis not present

## 2014-12-02 ENCOUNTER — Telehealth: Payer: Self-pay | Admitting: Family Medicine

## 2014-12-02 ENCOUNTER — Telehealth: Payer: Self-pay | Admitting: Cardiology

## 2014-12-02 MED ORDER — MIRABEGRON ER 50 MG PO TB24: 50.0000 mg | ORAL_TABLET | Freq: Every day | ORAL | Status: DC

## 2014-12-02 MED ORDER — RIVASTIGMINE TARTRATE 1.5 MG PO CAPS: 1.5000 mg | ORAL_CAPSULE | Freq: Two times a day (BID) | ORAL | Status: DC

## 2014-12-02 MED ORDER — LISINOPRIL 10 MG PO TABS: ORAL_TABLET | ORAL | Status: DC

## 2014-12-02 NOTE — Telephone Encounter (Signed)
Rec'd [facility] forward 3 pages to [name]

## 2014-12-02 NOTE — Telephone Encounter (Signed)
Refill req 

## 2014-12-11 ENCOUNTER — Telehealth: Payer: Self-pay | Admitting: *Deleted

## 2014-12-11 NOTE — Telephone Encounter (Signed)
Left message for pt to call, carotid dopplers show no significant stenosis. Reviewed by dr Stanford Breed, according to the dopplers from 05-2014 there is blockage on both sides. Will recheck dopplers in one year

## 2014-12-11 NOTE — Telephone Encounter (Signed)
Spoke with pt, .aware of results.  

## 2014-12-12 ENCOUNTER — Encounter: Payer: Self-pay | Admitting: Cardiology

## 2014-12-15 ENCOUNTER — Other Ambulatory Visit: Payer: Self-pay | Admitting: Family Medicine

## 2014-12-16 MED ORDER — ATORVASTATIN CALCIUM 40 MG PO TABS: 40.0000 mg | ORAL_TABLET | Freq: Every day | ORAL | Status: DC

## 2014-12-23 ENCOUNTER — Other Ambulatory Visit: Payer: Self-pay | Admitting: Family Medicine

## 2014-12-23 DIAGNOSIS — M4310 Spondylolisthesis, site unspecified: Secondary | ICD-10-CM

## 2014-12-23 MED ORDER — OXYCODONE-ACETAMINOPHEN 10-325 MG PO TABS: 0.5000 | ORAL_TABLET | Freq: Four times a day (QID) | ORAL | Status: DC | PRN

## 2014-12-25 ENCOUNTER — Encounter: Payer: Self-pay | Admitting: Cardiology

## 2014-12-25 ENCOUNTER — Ambulatory Visit (INDEPENDENT_AMBULATORY_CARE_PROVIDER_SITE_OTHER): Payer: Medicare Other | Admitting: Cardiology

## 2014-12-25 ENCOUNTER — Encounter: Payer: Self-pay | Admitting: *Deleted

## 2014-12-25 VITALS — BP 138/74 | HR 59 | Ht 67.0 in | Wt 168.4 lb

## 2014-12-25 DIAGNOSIS — E785 Hyperlipidemia, unspecified: Secondary | ICD-10-CM

## 2014-12-25 DIAGNOSIS — I482 Chronic atrial fibrillation, unspecified: Secondary | ICD-10-CM

## 2014-12-25 DIAGNOSIS — I6529 Occlusion and stenosis of unspecified carotid artery: Secondary | ICD-10-CM

## 2014-12-25 DIAGNOSIS — I4891 Unspecified atrial fibrillation: Secondary | ICD-10-CM | POA: Insufficient documentation

## 2014-12-25 DIAGNOSIS — I701 Atherosclerosis of renal artery: Secondary | ICD-10-CM | POA: Diagnosis not present

## 2014-12-25 DIAGNOSIS — I10 Essential (primary) hypertension: Secondary | ICD-10-CM | POA: Diagnosis not present

## 2014-12-25 DIAGNOSIS — I2510 Atherosclerotic heart disease of native coronary artery without angina pectoris: Secondary | ICD-10-CM

## 2014-12-25 DIAGNOSIS — I2583 Coronary atherosclerosis due to lipid rich plaque: Secondary | ICD-10-CM

## 2014-12-25 NOTE — Assessment & Plan Note (Signed)
Continue statin. 

## 2014-12-25 NOTE — Progress Notes (Signed)
HPI: FU coronary disease, cerebrovascular disease and renal artery stenosis. Previously resided in [place]. Patient underwent coronary artery bypassing graft in 2004. Records unavailable. She has had stents placed since then but none since 2006. Nuclear study October 2014 was normal with an ejection fraction of 56%. Had cath [facility] 7/15 but no records available. VQ scan September 2015 low probability. Renal Dopplers in September 2015 showed greater than 60% left renal artery stenosis. There was an anechoic density in the right kidney. Dedicated renal ultrasound revealed 2.5 cm cyst in the upper pole of the right kidney. Carotid Dopplers March 2016 at [facility] showed no stenosis. Echocardiogram January 2016 showed ejection fraction 50-55%, moderate left atrial enlargement and mild mitral regurgitation. Since last seen, she has occasional epigastric pain at night. She will drink water and then develops tight feeling in the epigastric area. This lasts 30 minutes and resolve spontaneously. She does not have exertional symptoms. There is no associated nausea, dyspnea or diaphoresis. The pain does not radiate. Mild dyspnea on exertion but no orthopnea, PND or pedal edema.  Current Outpatient Prescriptions  Medication Sig Dispense Refill  . albuterol (PROVENTIL HFA;VENTOLIN HFA) 108 (90 BASE) MCG/ACT inhaler Inhale two puffs every 4-6 hours only as needed for shortness of breath or wheezing. 1 Inhaler 1  . ALPRAZolam (XANAX) 0.25 MG tablet Take 1 tablet (0.25 mg total) by mouth at bedtime as needed for anxiety. 20 tablet 0  . AMBULATORY NON FORMULARY MEDICATION Diabetic Test Strips and Lancets: One Touch Ultra.   Dx Type 2 Diabetes.e11.21  Use to check blood sugar twice a day. 100 Units 3  . apixaban (ELIQUIS) 5 MG TABS tablet Take 1 tablet (5 mg total) by mouth 2 (two) times daily. 60 tablet 5  . atorvastatin (LIPITOR) 40 MG tablet Take 1 tablet (40 mg total) by mouth daily. 90 tablet 1  . Calcium  Carbonate-Vitamin D (CALTRATE 600+D PO) Take by mouth 2 (two) times daily.    . carvedilol (COREG) 6.25 MG tablet FULL TABLET BY MOUTH TWICE DAILY WITH MEALS 90 tablet 1  . escitalopram (LEXAPRO) 5 MG tablet TAKE 1 TABLET BY MOUTH ONCE DAILY 30 tablet 0  . escitalopram (LEXAPRO) 5 MG tablet TAKE ONE TABLET BY MOUTH ONCE DAILY. NEED APPOINTMENT FOR FURTHER REFILLS 30 tablet 0  . FeFum-FePoly-FA-B Cmp-C-Biot (INTEGRA PLUS) CAPS Take one capsule by mouth once daily 90 capsule 3  . Fluticasone-Salmeterol (ADVAIR) 500-50 MCG/DOSE AEPB Inhale 1 puff into the lungs 2 (two) times daily. 60 each 5  . glucose blood (ONE TOUCH ULTRA TEST) test strip Check blood glucose twice daily. Diagnosis Diabetes ICD-10 - E11.21 100 each 11  . Insulin Glargine (LANTUS) 100 UNIT/ML Solostar Pen Inject 15 Units into the skin. Daily at 10 am    . IRON PO Take by mouth.    Marland Kitchen JANUVIA 50 MG tablet TAKE ONE TABLET BY MOUTH ONCE DAILY 30 tablet 2  . levothyroxine (SYNTHROID, LEVOTHROID) 75 MCG tablet Take 1 tablet (75 mcg total) by mouth daily before breakfast. 90 tablet 1  . lisinopril (PRINIVIL,ZESTRIL) 10 MG tablet Take 1 tablet by mouth daily 30 tablet 3  . mirabegron ER (MYRBETRIQ) 50 MG TB24 tablet Take 1 tablet (50 mg total) by mouth daily. 30 tablet 5  . montelukast (SINGULAIR) 10 MG tablet Take 1 tablet (10 mg total) by mouth at bedtime. 30 tablet 5  . Multiple Vitamin (MULTIVITAMIN) capsule Take 1 capsule by mouth.    . oxyCODONE-acetaminophen (PERCOCET)  10-325 MG per tablet Take 0.5-1 tablets by mouth every 6 (six) hours as needed for pain. 90 tablet 0  . rivastigmine (EXELON) 1.5 MG capsule Take 1 capsule (1.5 mg total) by mouth 2 (two) times daily. 60 capsule 5   No current facility-administered medications for this visit.     Past Medical History  Diagnosis Date  . Chronic renal insufficiency 02/26/2014  . COPD (chronic obstructive pulmonary disease) 02/26/2014  . Hypothyroid 02/26/2014  . Presence of stent in  coronary artery in patient with coronary artery disease 02/26/2014  . Type 2 diabetes mellitus 02/26/2014  . Asthma   . CAD (coronary artery disease)   . Renal artery stenosis   . Atrial flutter   . Cerebrovascular disease     Past Surgical History  Procedure Laterality Date  . Coronary artery bypass graft      2004, [place]  . Hip replacement    . Knee surgery Bilateral   . Back surgery    . Cholecystectomy    . Tonsillectomy    . Appendectomy    . Abdominal hysterectomy    . Shoulder surgery    . Nasal sinus surgery      History   Social History  . Marital Status: Married    Spouse Name: N/A  . Number of Children: 4  . Years of Education: N/A   Occupational History  . Not on file.   Social History Main Topics  . Smoking status: Never Smoker   . Smokeless tobacco: Not on file  . Alcohol Use: No  . Drug Use: No  . Sexual Activity: No   Other Topics Concern  . Not on file   Social History Narrative    ROS: no fevers or chills, productive cough, hemoptysis, dysphasia, odynophagia, melena, hematochezia, dysuria, hematuria, rash, seizure activity, orthopnea, PND, pedal edema, claudication. Remaining systems are negative.  Physical Exam: Well-developed well-nourished in no acute distress.  Skin is warm and dry.  HEENT is normal.  Neck is supple.  Chest is clear to auscultation with normal expansion.  Cardiovascular exam is irregular Abdominal exam nontender or distended. No masses palpated. Extremities show no edema. neuro grossly intact  ECG atrial fibrillation at a rate of 59. Left anterior fascicular block. Septal infarct. Inferior lateral T-wave inversion. Left ventricular hypertrophy.

## 2014-12-25 NOTE — Assessment & Plan Note (Signed)
Continue statin. Not on aspirin given need for anticoagulation. Patient is describing epigastric tightness at night after drinking water. It improves with sitting up and walking. Her symptoms sound GI mediated. She does have inferior lateral T-wave inversion on electrocardiogram. I will plan a nuclear study for risk stratification.

## 2014-12-25 NOTE — Assessment & Plan Note (Signed)
Blood pressure controlled. Continue present medications. 

## 2014-12-25 NOTE — Patient Instructions (Signed)
Your physician wants you to follow-up in: Briana Werner will receive a reminder letter in the mail two months in advance. If you don't receive a letter, please call our office to schedule the follow-up appointment.   Your physician has requested that you have a lexiscan myoview. For further information please visit [email or web address]. Please follow instruction sheet, as given.   Your physician recommends that you HAVE LAB WORK TODAY

## 2014-12-25 NOTE — Assessment & Plan Note (Signed)
Follow-up carotid Dopplers show less impressive disease compared to previous. We will repeat in one year.

## 2014-12-25 NOTE — Assessment & Plan Note (Signed)
Continue beta blocker and apixaban; check hemoglobin and renal function.

## 2014-12-26 LAB — CBC
HCT: 37 % (ref 36.0–46.0)
Hemoglobin: 12.3 g/dL (ref 12.0–15.0)
MCH: 29.6 pg (ref 26.0–34.0)
MCHC: 33.2 g/dL (ref 30.0–36.0)
MCV: 88.9 fL (ref 78.0–100.0)
MPV: 10.8 fL (ref 8.6–12.4)
Platelets: 247 10*3/uL (ref 150–400)
RBC: 4.16 MIL/uL (ref 3.87–5.11)
RDW: 14.7 % (ref 11.5–15.5)
WBC: 10.2 10*3/uL (ref 4.0–10.5)

## 2014-12-26 LAB — BASIC METABOLIC PANEL WITH GFR
BUN: 35 mg/dL — AB (ref 6–23)
CO2: 28 meq/L (ref 19–32)
Calcium: 9.5 mg/dL (ref 8.4–10.5)
Chloride: 102 mEq/L (ref 96–112)
Creat: 1.39 mg/dL — ABNORMAL HIGH (ref 0.50–1.10)
GFR, EST AFRICAN AMERICAN: 42 mL/min — AB
GFR, Est Non African American: 37 mL/min — ABNORMAL LOW
Glucose, Bld: 97 mg/dL (ref 70–99)
POTASSIUM: 4.9 meq/L (ref 3.5–5.3)
Sodium: 138 mEq/L (ref 135–145)

## 2014-12-31 ENCOUNTER — Telehealth (HOSPITAL_COMMUNITY): Payer: Self-pay

## 2014-12-31 NOTE — Telephone Encounter (Signed)
Encounter complete. 

## 2015-01-02 ENCOUNTER — Ambulatory Visit (HOSPITAL_COMMUNITY)
Admission: RE | Admit: 2015-01-02 | Discharge: 2015-01-02 | Disposition: A | Discharge status: Home / Self Care | Payer: Medicare Other | Source: Ambulatory Visit | Attending: Cardiology | Admitting: Cardiology

## 2015-01-02 DIAGNOSIS — E119 Type 2 diabetes mellitus without complications: Secondary | ICD-10-CM | POA: Diagnosis not present

## 2015-01-02 DIAGNOSIS — R5383 Other fatigue: Secondary | ICD-10-CM | POA: Insufficient documentation

## 2015-01-02 DIAGNOSIS — R42 Dizziness and giddiness: Secondary | ICD-10-CM | POA: Diagnosis not present

## 2015-01-02 DIAGNOSIS — R0609 Other forms of dyspnea: Secondary | ICD-10-CM | POA: Insufficient documentation

## 2015-01-02 DIAGNOSIS — I4891 Unspecified atrial fibrillation: Secondary | ICD-10-CM | POA: Insufficient documentation

## 2015-01-02 DIAGNOSIS — Z8249 Family history of ischemic heart disease and other diseases of the circulatory system: Secondary | ICD-10-CM | POA: Diagnosis not present

## 2015-01-02 DIAGNOSIS — J449 Chronic obstructive pulmonary disease, unspecified: Secondary | ICD-10-CM | POA: Diagnosis not present

## 2015-01-02 DIAGNOSIS — I10 Essential (primary) hypertension: Secondary | ICD-10-CM | POA: Diagnosis not present

## 2015-01-02 DIAGNOSIS — I2510 Atherosclerotic heart disease of native coronary artery without angina pectoris: Secondary | ICD-10-CM | POA: Diagnosis not present

## 2015-01-02 DIAGNOSIS — Z951 Presence of aortocoronary bypass graft: Secondary | ICD-10-CM | POA: Diagnosis not present

## 2015-01-02 DIAGNOSIS — I739 Peripheral vascular disease, unspecified: Secondary | ICD-10-CM | POA: Diagnosis not present

## 2015-01-02 DIAGNOSIS — R079 Chest pain, unspecified: Secondary | ICD-10-CM | POA: Diagnosis not present

## 2015-01-02 MED ORDER — REGADENOSON 0.4 MG/5ML IV SOLN
0.4000 mg | Freq: Once | INTRAVENOUS | Status: AC
  Administered 2015-01-02: 0.4 mg via INTRAVENOUS

## 2015-01-02 MED ORDER — TECHNETIUM TC 99M SESTAMIBI GENERIC - CARDIOLITE
30.1000 | Freq: Once | INTRAVENOUS | Status: AC | PRN
  Administered 2015-01-02: 30.1 via INTRAVENOUS

## 2015-01-02 MED ORDER — AMINOPHYLLINE 25 MG/ML IV SOLN
125.0000 mg | Freq: Once | INTRAVENOUS | Status: AC
  Administered 2015-01-02: 125 mg via INTRAVENOUS

## 2015-01-02 MED ORDER — TECHNETIUM TC 99M SESTAMIBI GENERIC - CARDIOLITE
10.6000 | Freq: Once | INTRAVENOUS | Status: AC | PRN
Start: 2015-01-02 — End: 2015-01-02
  Administered 2015-01-02: 11 via INTRAVENOUS

## 2015-01-02 NOTE — Procedures (Addendum)
[place] [address] [place] [id number]  Cardiology Nuclear Med Study  Briana Werner is a 79 y.o. female     MRN : [id number]     DOB: May 10, 1936  Procedure Date: 01/02/2015  Nuclear Med Background Indication for Stress Test:  Abnormal EKG History:  COPD and CAD;STENT/PTCA-02/2014;CABG-2004;Last NUC MPI on 06/21/2010-normal;EF=56%;AFIB/FLUTTER; Cardiac Risk Factors: Carotid Disease, Family History - CAD, Hypertension, IDDM Type 2, Lipids, PVD and CEREBROVASCULAR DISEASE  Symptoms:  Chest Pain, Diaphoresis, Dizziness, DOE, Fatigue, Light-Headedness and Palpitations   Nuclear Pre-Procedure Caffeine/Decaff Intake:  1:00am NPO After: 9:00am   IV Site: R Forearm  IV 0.9% NS with Angio Cath:  22g  Chest Size (in):  N/A IV Started by: Larene Beach, RN  Height: 5\' 7"  (1.702 m)  Cup Size: B  BMI:  Body mass index is 26.31 kg/(m^2). Weight:  168 lb (76.204 kg)   Tech Comments:  n/a    Nuclear Med Study 1 or 2 day study: 1 day  Stress Test Type:  Josephine Provider:  Kirk Ruths, MD   Resting Radionuclide: Technetium 46m Sestamibi  Resting Radionuclide Dose: 10.6 mCi   Stress Radionuclide:  Technetium 73m Sestamibi  Stress Radionuclide Dose: 30.1 mCi           Stress Protocol Rest HR: 71 Stress HR: 84  Rest BP: 134/71 Stress BP: 134/70  Exercise Time (min): n/a METS: n/a          Dose of Adenosine (mg):  n/a Dose of Lexiscan: 0.4 mg  Dose of Atropine (mg): n/a Dose of Dobutamine: n/a mcg/kg/min (at max HR)  Stress Test Technologist: Leane Para, CCT Nuclear Technologist:Elizabeth Young,CNMT   Rest Procedure:  Myocardial perfusion imaging was performed at rest 45 minutes following the intravenous administration of Technetium 41m Sestamibi. Stress Procedure:  The patient received IV Lexiscan 0.4 mg over 15-seconds.  Technetium 75m Sestamibi injected IV at 30-seconds.  Patient  experienced SOB, Dizziness, Headache and stomach discomfort and 125 mg Aminophylline IV was administered.  There were no significant changes with Lexiscan.  Quantitative spect images were obtained after a 45 minute delay.  Transient Ischemic Dilatation (Normal <1.22):  QGS EDV:  NA QGS ESV:  NA LV Ejection Fraction: Study not gated     Rest ECG: Atrial Fibrillation with nonspecific T wave abnormality.  Stress ECG: No significant change from baseline ECG  QPS Raw Data Images:  Normal; no motion artifact; normal heart/lung ratio. Stress Images:  Normal homogeneous uptake in all areas of the myocardium. Rest Images:  Normal homogeneous uptake in all areas of the myocardium. Subtraction (SDS):  Normal  Impression Exercise Capacity:  Lexiscan with no exercise. BP Response:  Normal blood pressure response. Clinical Symptoms:  Mild shortness of breath ECG Impression:  No significant ST segment change suggestive of ischemia. Comparison with Prior Nuclear Study: No significant change from previous study  Overall Impression:  Normal stress nuclear study.  LV Wall Motion:  Not gated due to atrial fibrillation.   Troy Sine, MD  01/02/2015 5:37 PM

## 2015-01-13 DIAGNOSIS — Z794 Long term (current) use of insulin: Secondary | ICD-10-CM | POA: Diagnosis not present

## 2015-01-13 DIAGNOSIS — R296 Repeated falls: Secondary | ICD-10-CM | POA: Diagnosis not present

## 2015-01-13 DIAGNOSIS — R51 Headache: Secondary | ICD-10-CM | POA: Diagnosis not present

## 2015-01-13 DIAGNOSIS — S0081XA Abrasion of other part of head, initial encounter: Secondary | ICD-10-CM | POA: Diagnosis not present

## 2015-01-13 DIAGNOSIS — M542 Cervicalgia: Secondary | ICD-10-CM | POA: Diagnosis not present

## 2015-01-13 DIAGNOSIS — Z91048 Other nonmedicinal substance allergy status: Secondary | ICD-10-CM | POA: Diagnosis not present

## 2015-01-13 DIAGNOSIS — M25551 Pain in right hip: Secondary | ICD-10-CM | POA: Diagnosis not present

## 2015-01-13 DIAGNOSIS — Z7901 Long term (current) use of anticoagulants: Secondary | ICD-10-CM | POA: Diagnosis not present

## 2015-01-13 DIAGNOSIS — Z7982 Long term (current) use of aspirin: Secondary | ICD-10-CM | POA: Diagnosis not present

## 2015-01-13 DIAGNOSIS — S8002XA Contusion of left knee, initial encounter: Secondary | ICD-10-CM | POA: Diagnosis not present

## 2015-01-13 DIAGNOSIS — Z955 Presence of coronary angioplasty implant and graft: Secondary | ICD-10-CM | POA: Diagnosis not present

## 2015-01-13 DIAGNOSIS — E119 Type 2 diabetes mellitus without complications: Secondary | ICD-10-CM | POA: Diagnosis not present

## 2015-01-13 DIAGNOSIS — E039 Hypothyroidism, unspecified: Secondary | ICD-10-CM | POA: Diagnosis not present

## 2015-01-13 DIAGNOSIS — Z881 Allergy status to other antibiotic agents status: Secondary | ICD-10-CM | POA: Diagnosis not present

## 2015-01-13 DIAGNOSIS — I2510 Atherosclerotic heart disease of native coronary artery without angina pectoris: Secondary | ICD-10-CM | POA: Diagnosis not present

## 2015-01-13 DIAGNOSIS — Z888 Allergy status to other drugs, medicaments and biological substances status: Secondary | ICD-10-CM | POA: Diagnosis not present

## 2015-01-13 DIAGNOSIS — J45909 Unspecified asthma, uncomplicated: Secondary | ICD-10-CM | POA: Diagnosis not present

## 2015-01-13 DIAGNOSIS — Z880 Allergy status to penicillin: Secondary | ICD-10-CM | POA: Diagnosis not present

## 2015-01-13 DIAGNOSIS — Z79899 Other long term (current) drug therapy: Secondary | ICD-10-CM | POA: Diagnosis not present

## 2015-01-13 DIAGNOSIS — S0990XA Unspecified injury of head, initial encounter: Secondary | ICD-10-CM | POA: Diagnosis not present

## 2015-01-13 DIAGNOSIS — Z91040 Latex allergy status: Secondary | ICD-10-CM | POA: Diagnosis not present

## 2015-01-13 DIAGNOSIS — S79922A Unspecified injury of left thigh, initial encounter: Secondary | ICD-10-CM | POA: Diagnosis not present

## 2015-01-14 ENCOUNTER — Other Ambulatory Visit: Payer: Self-pay | Admitting: Family Medicine

## 2015-01-20 ENCOUNTER — Other Ambulatory Visit: Payer: Self-pay | Admitting: Family Medicine

## 2015-01-30 ENCOUNTER — Other Ambulatory Visit: Payer: Self-pay | Admitting: *Deleted

## 2015-01-30 DIAGNOSIS — M4310 Spondylolisthesis, site unspecified: Secondary | ICD-10-CM

## 2015-01-30 MED ORDER — OXYCODONE-ACETAMINOPHEN 10-325 MG PO TABS: 0.5000 | ORAL_TABLET | Freq: Four times a day (QID) | ORAL | Status: DC | PRN

## 2015-02-04 ENCOUNTER — Encounter: Payer: Self-pay | Admitting: Family Medicine

## 2015-02-04 ENCOUNTER — Ambulatory Visit (INDEPENDENT_AMBULATORY_CARE_PROVIDER_SITE_OTHER): Payer: Medicare Other | Admitting: Family Medicine

## 2015-02-04 ENCOUNTER — Ambulatory Visit (INDEPENDENT_AMBULATORY_CARE_PROVIDER_SITE_OTHER): Payer: Medicare Other

## 2015-02-04 VITALS — BP 130/53 | HR 83 | Ht 67.0 in | Wt 167.0 lb

## 2015-02-04 DIAGNOSIS — I701 Atherosclerosis of renal artery: Secondary | ICD-10-CM

## 2015-02-04 DIAGNOSIS — M25562 Pain in left knee: Secondary | ICD-10-CM

## 2015-02-04 DIAGNOSIS — R079 Chest pain, unspecified: Secondary | ICD-10-CM

## 2015-02-04 DIAGNOSIS — Z96652 Presence of left artificial knee joint: Secondary | ICD-10-CM

## 2015-02-04 DIAGNOSIS — I7389 Other specified peripheral vascular diseases: Secondary | ICD-10-CM | POA: Diagnosis not present

## 2015-02-04 DIAGNOSIS — S8992XA Unspecified injury of left lower leg, initial encounter: Secondary | ICD-10-CM | POA: Diagnosis not present

## 2015-02-04 MED ORDER — RANITIDINE HCL 150 MG PO TABS: ORAL_TABLET | ORAL | Status: DC

## 2015-02-04 NOTE — Progress Notes (Signed)
CC: Briana Werner Werner is a 79 y.o. female is here for Follow-up   Subjective: HPI:  Sustained a fall on the second of this month. She fell face first into a wall and then hit her left knee against the wall as well. She was seeing a local emergency room had a normal CT scan of the head a CT scan of the Cervical spine that showed no acute findings, x-ray of the right hip and left femur both of which showed no acute findings. She tells me her biggest complaint since the fall is both chest pain and left knee pain that was not present prior to the fall.  Complains of chest pain is localized underneath the sternum that occurs every time she eats or drinks anything. His been going on for the last month now ever since the time of her fall. Looking at old records with [name] this pain is also present prior to the fall And prompted a nuclear medicine scan of the heart which was normal. She tells me the pain is described as a cramping sensation nonradiating and lasts for about 30 minutes after eating or drinking anything. It goes away very very slowly over the course of 30 minutes without any intervention other than stopping eating or drinking. It happens with water or anything thing with calories. Denies nausea, regurgitation, nor sensation of reflux. No fevers, chills, shortness of breath or exertional chest pain.   Complains of left knee pain localized on the anterior aspect of the knee nonradiating. It's present with any movement of the knee. It's absent at rest. Pain has been problematic ever since the time of the accident described above. Does not seem to be getting better or worse since onset. He described only as pain, moderate in severity. She tells me the joint feels warm but there has been no bruising swelling or redness.  Review Of Systems Outlined In HPI  Past Medical History  Diagnosis Date  . Chronic renal insufficiency 02/26/2014  . COPD (chronic obstructive pulmonary disease) 02/26/2014  .  Hypothyroid 02/26/2014  . Presence of stent in coronary artery in patient with coronary artery disease 02/26/2014  . Type 2 diabetes mellitus 02/26/2014  . Asthma   . CAD (coronary artery disease)   . Renal artery stenosis   . Atrial flutter   . Cerebrovascular disease     Past Surgical History  Procedure Laterality Date  . Coronary artery bypass graft      2004, [place]  . Hip replacement    . Knee surgery Bilateral   . Back surgery    . Cholecystectomy    . Tonsillectomy    . Appendectomy    . Abdominal hysterectomy    . Shoulder surgery    . Nasal sinus surgery     Family History  Problem Relation Age of Onset  . CAD Father   . Heart attack Father     History   Social History  . Marital Status: Married    Spouse Name: N/A  . Number of Children: 4  . Years of Education: N/A   Occupational History  . Not on file.   Social History Main Topics  . Smoking status: Never Smoker   . Smokeless tobacco: Not on file  . Alcohol Use: No  . Drug Use: No  . Sexual Activity: No   Other Topics Concern  . Not on file   Social History Narrative     Objective: BP 130/53 mmHg  Pulse 83  Ht 5\' 7"  (1.702 m)  Wt 167 lb (75.751 kg)  BMI 26.15 kg/m2  General: Alert and Oriented, No Acute Distress HEENT: Pupils equal, round, reactive to light. Conjunctivae clear. Moist mucous membranes pharynx unremarkable Lungs: Clear to auscultation bilaterally, no wheezing/ronchi/rales.  Comfortable work of breathing. Good air movement. Cardiac: Regular rate and rhythm. Normal S1/S2.  No murmurs, rubs, nor gallops.   Abdomen: Flat soft nontender Left knee exam shows full-strength and range of motion. There is no swelling, redness, nor warmth overlying the knee.  No patellar crepitus. No patellar apprehension. Pain reproduced with palpation of the inferior patellar pole.  Pain reproduced with varus stress but no pain with valgus stress.Marland Kitchen Anterior drawer is negative.  No popliteal space  tenderness or palpable mass. Both medial and lateral joint line tenderness to palpation. Extremities: No peripheral edema.  Strong peripheral pulses.  Mental Status: No depression, anxiety, nor agitation. Skin: Warm and dry.  Assessment & Plan: Briana Werner Werner was seen today for follow-up.  Diagnoses and all orders for this visit:  Chest pain, unspecified chest pain type Orders: -     ranitidine (ZANTAC) 150 MG tablet; One by mouth twice a day to prevent gastric reflux symptoms.  Left anterior knee pain Orders: -     DG Knee Complete 4 Views Left; Future   Chest pain: Suspect esophagitis or plain reflux therefore start ranitidine twice a day and notify me if no benefit after 1 week. Left anterior knee pain: To evaluate for fracture x-ray will be obtained. Ultimate plan will be based on x-rays today.  40 minutes spent face-to-face during visit today of which at least 50% was counseling or coordinating care regarding: 1. Chest pain, unspecified chest pain type   2. Left anterior knee pain       Return if symptoms worsen or fail to improve.

## 2015-02-07 ENCOUNTER — Telehealth: Payer: Self-pay | Admitting: *Deleted

## 2015-02-07 MED ORDER — DICLOFENAC SODIUM 2 % TD SOLN: TRANSDERMAL | Status: DC

## 2015-02-07 NOTE — Telephone Encounter (Signed)
Rx sent to [facility]

## 2015-02-07 NOTE — Telephone Encounter (Signed)
Pt requests a rx for Penssaid. I didn't see this on her med list.routing to provider

## 2015-02-14 ENCOUNTER — Telehealth: Payer: Self-pay | Admitting: Family Medicine

## 2015-02-14 DIAGNOSIS — L749 Eccrine sweat disorder, unspecified: Secondary | ICD-10-CM

## 2015-02-14 DIAGNOSIS — R5383 Other fatigue: Secondary | ICD-10-CM | POA: Diagnosis not present

## 2015-02-14 DIAGNOSIS — G4710 Hypersomnia, unspecified: Secondary | ICD-10-CM

## 2015-02-14 LAB — CBC
HCT: 31.9 % — ABNORMAL LOW (ref 36.0–46.0)
Hemoglobin: 10.5 g/dL — ABNORMAL LOW (ref 12.0–15.0)
MCH: 29.7 pg (ref 26.0–34.0)
MCHC: 32.9 g/dL (ref 30.0–36.0)
MCV: 90.1 fL (ref 78.0–100.0)
MPV: 11.4 fL (ref 8.6–12.4)
PLATELETS: 159 10*3/uL (ref 150–400)
RBC: 3.54 MIL/uL — ABNORMAL LOW (ref 3.87–5.11)
RDW: 14.5 % (ref 11.5–15.5)
WBC: 10.8 10*3/uL — ABNORMAL HIGH (ref 4.0–10.5)

## 2015-02-14 LAB — TSH: TSH: 5.108 u[IU]/mL — AB (ref 0.350–4.500)

## 2015-02-14 MED ORDER — PANTOPRAZOLE SODIUM 40 MG PO TBEC: 40.0000 mg | DELAYED_RELEASE_TABLET | Freq: Every day | ORAL | Status: DC

## 2015-02-14 NOTE — Telephone Encounter (Signed)
Ranitidine not helping, start protonix. Extreme fatigue obtaining labs.

## 2015-02-15 LAB — COMPLETE METABOLIC PANEL WITH GFR
ALK PHOS: 85 U/L (ref 39–117)
ALT: 27 U/L (ref 0–35)
AST: 51 U/L — AB (ref 0–37)
Albumin: 3.9 g/dL (ref 3.5–5.2)
BUN: 43 mg/dL — ABNORMAL HIGH (ref 6–23)
CHLORIDE: 102 meq/L (ref 96–112)
CO2: 22 meq/L (ref 19–32)
Calcium: 9 mg/dL (ref 8.4–10.5)
Creat: 1.84 mg/dL — ABNORMAL HIGH (ref 0.50–1.10)
GFR, EST AFRICAN AMERICAN: 30 mL/min — AB
GFR, EST NON AFRICAN AMERICAN: 26 mL/min — AB
GLUCOSE: 243 mg/dL — AB (ref 70–99)
POTASSIUM: 5.2 meq/L (ref 3.5–5.3)
SODIUM: 137 meq/L (ref 135–145)
Total Bilirubin: 1.4 mg/dL — ABNORMAL HIGH (ref 0.2–1.2)
Total Protein: 6.9 g/dL (ref 6.0–8.3)

## 2015-02-15 LAB — VITAMIN D 25 HYDROXY (VIT D DEFICIENCY, FRACTURES): VIT D 25 HYDROXY: 39 ng/mL (ref 30–100)

## 2015-02-16 ENCOUNTER — Telehealth: Payer: Self-pay | Admitting: Family Medicine

## 2015-02-16 DIAGNOSIS — E039 Hypothyroidism, unspecified: Secondary | ICD-10-CM

## 2015-02-16 DIAGNOSIS — E785 Hyperlipidemia, unspecified: Secondary | ICD-10-CM

## 2015-02-16 DIAGNOSIS — D649 Anemia, unspecified: Secondary | ICD-10-CM

## 2015-02-16 MED ORDER — LEVOTHYROXINE SODIUM 88 MCG PO TABS: 88.0000 ug | ORAL_TABLET | Freq: Every day | ORAL | Status: DC

## 2015-02-16 NOTE — Telephone Encounter (Signed)
[name], Will you please let patient know that her thyroid supplementation appears to be under-dosed.  I've sent in a new Rx of levothyroxine at a dose of 88 micrograms to replace her former 75 microgram formulation.  Also her hemoglobin level was somewhat low which could also be causing her fatigue.  The most common cause of this is an iron deficiency therefore I'd recommend she take a once a day OTC formulation of Iron Sulfate at a dose of 325mg  and return in one month to have these numbers rechecked.

## 2015-02-17 ENCOUNTER — Other Ambulatory Visit: Payer: Self-pay | Admitting: Family Medicine

## 2015-02-17 NOTE — Telephone Encounter (Signed)
Pt.notified

## 2015-02-17 NOTE — Telephone Encounter (Signed)
Pt has not been seen for anxiety since 04/2014 but has been seen for other reasons since.Will route to PCP to see if refill is appropriate of if Pt needs appt for that reason.

## 2015-02-18 NOTE — Telephone Encounter (Signed)
Ok to refill both?? 

## 2015-02-27 DIAGNOSIS — G301 Alzheimer's disease with late onset: Secondary | ICD-10-CM

## 2015-02-27 DIAGNOSIS — F0280 Dementia in other diseases classified elsewhere without behavioral disturbance: Secondary | ICD-10-CM | POA: Insufficient documentation

## 2015-03-04 DIAGNOSIS — J189 Pneumonia, unspecified organism: Secondary | ICD-10-CM | POA: Diagnosis not present

## 2015-03-04 DIAGNOSIS — A4151 Sepsis due to Escherichia coli [E. coli]: Secondary | ICD-10-CM | POA: Diagnosis not present

## 2015-03-04 DIAGNOSIS — R509 Fever, unspecified: Secondary | ICD-10-CM | POA: Diagnosis not present

## 2015-03-04 DIAGNOSIS — I4891 Unspecified atrial fibrillation: Secondary | ICD-10-CM | POA: Diagnosis not present

## 2015-03-04 DIAGNOSIS — N10 Acute tubulo-interstitial nephritis: Secondary | ICD-10-CM | POA: Diagnosis not present

## 2015-03-04 DIAGNOSIS — A419 Sepsis, unspecified organism: Secondary | ICD-10-CM | POA: Diagnosis not present

## 2015-03-04 DIAGNOSIS — R079 Chest pain, unspecified: Secondary | ICD-10-CM | POA: Diagnosis not present

## 2015-03-04 DIAGNOSIS — B9620 Unspecified Escherichia coli [E. coli] as the cause of diseases classified elsewhere: Secondary | ICD-10-CM | POA: Diagnosis not present

## 2015-03-04 DIAGNOSIS — N3090 Cystitis, unspecified without hematuria: Secondary | ICD-10-CM | POA: Diagnosis not present

## 2015-03-04 DIAGNOSIS — E871 Hypo-osmolality and hyponatremia: Secondary | ICD-10-CM | POA: Diagnosis not present

## 2015-03-04 DIAGNOSIS — I482 Chronic atrial fibrillation: Secondary | ICD-10-CM | POA: Diagnosis not present

## 2015-03-04 DIAGNOSIS — N12 Tubulo-interstitial nephritis, not specified as acute or chronic: Secondary | ICD-10-CM | POA: Diagnosis not present

## 2015-03-04 DIAGNOSIS — E1122 Type 2 diabetes mellitus with diabetic chronic kidney disease: Secondary | ICD-10-CM | POA: Diagnosis not present

## 2015-03-04 DIAGNOSIS — E1142 Type 2 diabetes mellitus with diabetic polyneuropathy: Secondary | ICD-10-CM | POA: Diagnosis not present

## 2015-03-05 DIAGNOSIS — G301 Alzheimer's disease with late onset: Secondary | ICD-10-CM | POA: Diagnosis present

## 2015-03-05 DIAGNOSIS — D500 Iron deficiency anemia secondary to blood loss (chronic): Secondary | ICD-10-CM | POA: Diagnosis present

## 2015-03-05 DIAGNOSIS — N3090 Cystitis, unspecified without hematuria: Secondary | ICD-10-CM | POA: Diagnosis not present

## 2015-03-05 DIAGNOSIS — E782 Mixed hyperlipidemia: Secondary | ICD-10-CM | POA: Diagnosis present

## 2015-03-05 DIAGNOSIS — R109 Unspecified abdominal pain: Secondary | ICD-10-CM | POA: Diagnosis not present

## 2015-03-05 DIAGNOSIS — Z888 Allergy status to other drugs, medicaments and biological substances status: Secondary | ICD-10-CM | POA: Diagnosis not present

## 2015-03-05 DIAGNOSIS — R531 Weakness: Secondary | ICD-10-CM | POA: Diagnosis not present

## 2015-03-05 DIAGNOSIS — A4151 Sepsis due to Escherichia coli [E. coli]: Secondary | ICD-10-CM | POA: Diagnosis not present

## 2015-03-05 DIAGNOSIS — B9620 Unspecified Escherichia coli [E. coli] as the cause of diseases classified elsewhere: Secondary | ICD-10-CM | POA: Diagnosis not present

## 2015-03-05 DIAGNOSIS — J449 Chronic obstructive pulmonary disease, unspecified: Secondary | ICD-10-CM | POA: Diagnosis present

## 2015-03-05 DIAGNOSIS — J45909 Unspecified asthma, uncomplicated: Secondary | ICD-10-CM | POA: Diagnosis present

## 2015-03-05 DIAGNOSIS — Z7901 Long term (current) use of anticoagulants: Secondary | ICD-10-CM | POA: Diagnosis not present

## 2015-03-05 DIAGNOSIS — I4891 Unspecified atrial fibrillation: Secondary | ICD-10-CM | POA: Diagnosis not present

## 2015-03-05 DIAGNOSIS — A419 Sepsis, unspecified organism: Secondary | ICD-10-CM | POA: Diagnosis not present

## 2015-03-05 DIAGNOSIS — N10 Acute tubulo-interstitial nephritis: Secondary | ICD-10-CM | POA: Diagnosis not present

## 2015-03-05 DIAGNOSIS — Z951 Presence of aortocoronary bypass graft: Secondary | ICD-10-CM | POA: Diagnosis not present

## 2015-03-05 DIAGNOSIS — I482 Chronic atrial fibrillation: Secondary | ICD-10-CM | POA: Diagnosis present

## 2015-03-05 DIAGNOSIS — I129 Hypertensive chronic kidney disease with stage 1 through stage 4 chronic kidney disease, or unspecified chronic kidney disease: Secondary | ICD-10-CM | POA: Diagnosis present

## 2015-03-05 DIAGNOSIS — F0280 Dementia in other diseases classified elsewhere without behavioral disturbance: Secondary | ICD-10-CM | POA: Diagnosis present

## 2015-03-05 DIAGNOSIS — E039 Hypothyroidism, unspecified: Secondary | ICD-10-CM | POA: Diagnosis present

## 2015-03-05 DIAGNOSIS — I2510 Atherosclerotic heart disease of native coronary artery without angina pectoris: Secondary | ICD-10-CM | POA: Diagnosis not present

## 2015-03-05 DIAGNOSIS — A4159 Other Gram-negative sepsis: Secondary | ICD-10-CM | POA: Diagnosis not present

## 2015-03-05 DIAGNOSIS — N12 Tubulo-interstitial nephritis, not specified as acute or chronic: Secondary | ICD-10-CM | POA: Diagnosis not present

## 2015-03-05 DIAGNOSIS — J189 Pneumonia, unspecified organism: Secondary | ICD-10-CM | POA: Diagnosis not present

## 2015-03-05 DIAGNOSIS — Z91040 Latex allergy status: Secondary | ICD-10-CM | POA: Diagnosis not present

## 2015-03-05 DIAGNOSIS — E1142 Type 2 diabetes mellitus with diabetic polyneuropathy: Secondary | ICD-10-CM | POA: Diagnosis present

## 2015-03-05 DIAGNOSIS — Z955 Presence of coronary angioplasty implant and graft: Secondary | ICD-10-CM | POA: Diagnosis not present

## 2015-03-05 DIAGNOSIS — N183 Chronic kidney disease, stage 3 (moderate): Secondary | ICD-10-CM | POA: Diagnosis not present

## 2015-03-05 DIAGNOSIS — E1169 Type 2 diabetes mellitus with other specified complication: Secondary | ICD-10-CM | POA: Diagnosis present

## 2015-03-05 DIAGNOSIS — Z794 Long term (current) use of insulin: Secondary | ICD-10-CM | POA: Diagnosis not present

## 2015-03-05 DIAGNOSIS — E1122 Type 2 diabetes mellitus with diabetic chronic kidney disease: Secondary | ICD-10-CM | POA: Diagnosis not present

## 2015-03-05 DIAGNOSIS — E871 Hypo-osmolality and hyponatremia: Secondary | ICD-10-CM | POA: Diagnosis present

## 2015-03-05 DIAGNOSIS — Z883 Allergy status to other anti-infective agents status: Secondary | ICD-10-CM | POA: Diagnosis not present

## 2015-03-10 ENCOUNTER — Telehealth: Payer: Self-pay | Admitting: *Deleted

## 2015-03-10 DIAGNOSIS — Z9181 History of falling: Secondary | ICD-10-CM | POA: Diagnosis not present

## 2015-03-10 DIAGNOSIS — E1122 Type 2 diabetes mellitus with diabetic chronic kidney disease: Secondary | ICD-10-CM | POA: Diagnosis not present

## 2015-03-10 DIAGNOSIS — N390 Urinary tract infection, site not specified: Secondary | ICD-10-CM

## 2015-03-10 DIAGNOSIS — I2510 Atherosclerotic heart disease of native coronary artery without angina pectoris: Secondary | ICD-10-CM | POA: Diagnosis not present

## 2015-03-10 DIAGNOSIS — N183 Chronic kidney disease, stage 3 (moderate): Secondary | ICD-10-CM | POA: Diagnosis not present

## 2015-03-10 DIAGNOSIS — E1142 Type 2 diabetes mellitus with diabetic polyneuropathy: Secondary | ICD-10-CM | POA: Diagnosis not present

## 2015-03-10 DIAGNOSIS — I129 Hypertensive chronic kidney disease with stage 1 through stage 4 chronic kidney disease, or unspecified chronic kidney disease: Secondary | ICD-10-CM | POA: Diagnosis not present

## 2015-03-10 DIAGNOSIS — I4891 Unspecified atrial fibrillation: Secondary | ICD-10-CM | POA: Diagnosis not present

## 2015-03-10 DIAGNOSIS — F0280 Dementia in other diseases classified elsewhere without behavioral disturbance: Secondary | ICD-10-CM | POA: Diagnosis not present

## 2015-03-10 DIAGNOSIS — G301 Alzheimer's disease with late onset: Secondary | ICD-10-CM | POA: Diagnosis not present

## 2015-03-10 DIAGNOSIS — J449 Chronic obstructive pulmonary disease, unspecified: Secondary | ICD-10-CM | POA: Diagnosis not present

## 2015-03-10 NOTE — Telephone Encounter (Signed)
UA & urine culture ordered & faxed to [facility] per Dr. Ileene Rubens.

## 2015-03-12 DIAGNOSIS — N390 Urinary tract infection, site not specified: Secondary | ICD-10-CM | POA: Diagnosis not present

## 2015-03-13 ENCOUNTER — Telehealth: Payer: Self-pay | Admitting: Family Medicine

## 2015-03-13 DIAGNOSIS — I129 Hypertensive chronic kidney disease with stage 1 through stage 4 chronic kidney disease, or unspecified chronic kidney disease: Secondary | ICD-10-CM | POA: Diagnosis not present

## 2015-03-13 DIAGNOSIS — N390 Urinary tract infection, site not specified: Secondary | ICD-10-CM | POA: Diagnosis not present

## 2015-03-13 DIAGNOSIS — I2510 Atherosclerotic heart disease of native coronary artery without angina pectoris: Secondary | ICD-10-CM | POA: Diagnosis not present

## 2015-03-13 DIAGNOSIS — N183 Chronic kidney disease, stage 3 (moderate): Secondary | ICD-10-CM | POA: Diagnosis not present

## 2015-03-13 DIAGNOSIS — G301 Alzheimer's disease with late onset: Secondary | ICD-10-CM | POA: Diagnosis not present

## 2015-03-13 DIAGNOSIS — E1122 Type 2 diabetes mellitus with diabetic chronic kidney disease: Secondary | ICD-10-CM | POA: Diagnosis not present

## 2015-03-13 LAB — URINALYSIS
GLUCOSE, UA: NEGATIVE mg/dL
HGB URINE DIPSTICK: NEGATIVE
Nitrite: NEGATIVE
PH: 5 (ref 5.0–8.0)
PROTEIN: NEGATIVE mg/dL
SPECIFIC GRAVITY, URINE: 1.021 (ref 1.005–1.030)
Urobilinogen, UA: 0.2 mg/dL (ref 0.0–1.0)

## 2015-03-13 NOTE — Telephone Encounter (Signed)
[name], Will you please let patient know that her UA shows no signs of bacteria but the urine culture will be the true test to see if she fully cleared the infection, this should be back tomorrow or Monday

## 2015-03-14 ENCOUNTER — Other Ambulatory Visit: Payer: Self-pay | Admitting: *Deleted

## 2015-03-14 ENCOUNTER — Other Ambulatory Visit: Payer: Self-pay

## 2015-03-14 DIAGNOSIS — N390 Urinary tract infection, site not specified: Secondary | ICD-10-CM | POA: Diagnosis not present

## 2015-03-14 DIAGNOSIS — M4310 Spondylolisthesis, site unspecified: Secondary | ICD-10-CM

## 2015-03-14 LAB — URINE CULTURE: Colony Count: 10000

## 2015-03-14 MED ORDER — OXYCODONE-ACETAMINOPHEN 10-325 MG PO TABS: 0.5000 | ORAL_TABLET | Freq: Four times a day (QID) | ORAL | Status: DC | PRN

## 2015-03-14 MED ORDER — NYSTATIN 100000 UNIT/ML MT SUSP: OROMUCOSAL | Status: DC

## 2015-03-14 NOTE — Telephone Encounter (Signed)
Nystatin Rx sent to [facility]

## 2015-03-14 NOTE — Telephone Encounter (Signed)
Pt aware.

## 2015-03-14 NOTE — Telephone Encounter (Signed)
Pt notified. Pt states she has thrush in mouth and would like something called in

## 2015-03-17 LAB — URINE CULTURE

## 2015-03-17 MED ORDER — NITROFURANTOIN MONOHYD MACRO 100 MG PO CAPS: 100.0000 mg | ORAL_CAPSULE | Freq: Two times a day (BID) | ORAL | Status: AC

## 2015-03-18 DIAGNOSIS — E1122 Type 2 diabetes mellitus with diabetic chronic kidney disease: Secondary | ICD-10-CM | POA: Diagnosis not present

## 2015-03-18 DIAGNOSIS — I129 Hypertensive chronic kidney disease with stage 1 through stage 4 chronic kidney disease, or unspecified chronic kidney disease: Secondary | ICD-10-CM | POA: Diagnosis not present

## 2015-03-18 DIAGNOSIS — N183 Chronic kidney disease, stage 3 (moderate): Secondary | ICD-10-CM | POA: Diagnosis not present

## 2015-03-18 DIAGNOSIS — N390 Urinary tract infection, site not specified: Secondary | ICD-10-CM | POA: Diagnosis not present

## 2015-03-18 DIAGNOSIS — I2510 Atherosclerotic heart disease of native coronary artery without angina pectoris: Secondary | ICD-10-CM | POA: Diagnosis not present

## 2015-03-18 DIAGNOSIS — G301 Alzheimer's disease with late onset: Secondary | ICD-10-CM | POA: Diagnosis not present

## 2015-03-21 DIAGNOSIS — N183 Chronic kidney disease, stage 3 (moderate): Secondary | ICD-10-CM | POA: Diagnosis not present

## 2015-03-21 DIAGNOSIS — G301 Alzheimer's disease with late onset: Secondary | ICD-10-CM | POA: Diagnosis not present

## 2015-03-21 DIAGNOSIS — N390 Urinary tract infection, site not specified: Secondary | ICD-10-CM | POA: Diagnosis not present

## 2015-03-21 DIAGNOSIS — I2510 Atherosclerotic heart disease of native coronary artery without angina pectoris: Secondary | ICD-10-CM | POA: Diagnosis not present

## 2015-03-21 DIAGNOSIS — E1122 Type 2 diabetes mellitus with diabetic chronic kidney disease: Secondary | ICD-10-CM | POA: Diagnosis not present

## 2015-03-21 DIAGNOSIS — I129 Hypertensive chronic kidney disease with stage 1 through stage 4 chronic kidney disease, or unspecified chronic kidney disease: Secondary | ICD-10-CM | POA: Diagnosis not present

## 2015-03-24 ENCOUNTER — Encounter: Payer: Self-pay | Admitting: Family Medicine

## 2015-03-24 ENCOUNTER — Ambulatory Visit (INDEPENDENT_AMBULATORY_CARE_PROVIDER_SITE_OTHER): Payer: Medicare Other | Admitting: Family Medicine

## 2015-03-24 VITALS — BP 119/64 | HR 80 | Temp 97.4°F | Wt 154.0 lb

## 2015-03-24 DIAGNOSIS — R079 Chest pain, unspecified: Secondary | ICD-10-CM | POA: Diagnosis not present

## 2015-03-24 DIAGNOSIS — R1013 Epigastric pain: Secondary | ICD-10-CM

## 2015-03-24 DIAGNOSIS — I701 Atherosclerosis of renal artery: Secondary | ICD-10-CM

## 2015-03-24 MED ORDER — DILTIAZEM HCL ER 60 MG PO CP12
60.0000 mg | ORAL_CAPSULE | Freq: Two times a day (BID) | ORAL | Status: DC
Start: 2015-03-24 — End: 2015-04-10

## 2015-03-24 MED ORDER — SUCRALFATE 1 GM/10ML PO SUSP: 1.0000 g | Freq: Four times a day (QID) | ORAL | Status: DC | PRN

## 2015-03-24 NOTE — Progress Notes (Signed)
CC: Briana Werner is a 79 y.o. female is here for hospital f/u   Subjective: HPI:  Hospital follow-up for a fibrillation with rapid ventricular response in the setting of pyelonephritis admitted between June 22-26. She was found to have Escherichia coli in her urine that was pansensitive however did not respond to Keflex that was prescribed day earlier. She was discharged with Earl and Levaquin. She was only able to take 5 days of Omnicef due to it causing abdominal pain but did complete a full 10 days of Levaquin. She states that on the day of admission she was also experiencing sensation of chest heaviness and also epigastric pain that radiates into the back. She had an EKG done on the 22nd with a comment about ST and T-wave abnormality and to consider lateral ischemia. Symptoms have been persistent since onset and worse when getting up on walking around. She's had very little appetite but her pain and heaviness does not improve or worsen with eating or drinking. She denies any constipation or diarrhea. She is having difficulty sleeping because the chest heaviness and epigastric pain keep her awake at night. Other than above nothing seems to make symptoms better or worse. No interventions as of yet other than taking antibiotic so addition to nitrofurantoin that was prescribed by my partner last week.  She denies shortness of breath, cough, wheezing, abdominal pain other than described above. She denies fevers, chills, nor new joint pain or rashes. She denies confusion. No dysuria or flank pain.  Review Of Systems Outlined In HPI  Past Medical History  Diagnosis Date  . Chronic renal insufficiency 02/26/2014  . COPD (chronic obstructive pulmonary disease) 02/26/2014  . Hypothyroid 02/26/2014  . Presence of stent in coronary artery in patient with coronary artery disease 02/26/2014  . Type 2 diabetes mellitus 02/26/2014  . Asthma   . CAD (coronary artery disease)   . Renal artery stenosis   .  Atrial flutter   . Cerebrovascular disease     Past Surgical History  Procedure Laterality Date  . Coronary artery bypass graft      2004, [place]  . Hip replacement    . Knee surgery Bilateral   . Back surgery    . Cholecystectomy    . Tonsillectomy    . Appendectomy    . Abdominal hysterectomy    . Shoulder surgery    . Nasal sinus surgery     Family History  Problem Relation Age of Onset  . CAD Father   . Heart attack Father     History   Social History  . Marital Status: Married    Spouse Name: N/A  . Number of Children: 4  . Years of Education: N/A   Occupational History  . Not on file.   Social History Main Topics  . Smoking status: Never Smoker   . Smokeless tobacco: Not on file  . Alcohol Use: No  . Drug Use: No  . Sexual Activity: No   Other Topics Concern  . Not on file   Social History Narrative     Objective: BP 119/64 mmHg  Pulse 80  Temp(Src) 97.4 F (36.3 C) (Oral)  Wt 154 lb (69.854 kg)  General: Alert and Oriented, No Acute Distress HEENT: Pupils equal, round, reactive to light. Conjunctivae clear. Moist mucous membranes  Lungs: Clear to auscultation bilaterally, no wheezing/ronchi/rales.  Comfortable work of breathing. Good air movement. Cardiac:Irregularly irregular rhythm with a rate varying between 80-90 bpm.  Abdomen Soft with  no palpable masses. No rebound tenderness or guarding. Pain is reproduced with palpation of the epigastric region in the left upper quadrant.  Extremities: No peripheral edema.  Strong peripheral pulses.  Mental Status: No depression, anxiety, nor agitation. Skin: Warm and dry.  Assessment & Plan: Briana Werner was seen today for hospital f/u.  Diagnoses and all orders for this visit:  Abdominal pain, epigastric Orders: -     Troponin I -     Urine culture -     Lipase -     sucralfate (CARAFATE) 1 GM/10ML suspension; Take 10 mLs (1 g total) by mouth 4 (four) times daily as needed (abdominal pain). -      diltiazem (CARDIZEM SR) 60 MG 12 hr capsule; Take 1 capsule (60 mg total) by mouth 2 (two) times daily.  Chest pain, unspecified chest pain type Orders: -     Troponin I -     Urine culture -     Lipase -     sucralfate (CARAFATE) 1 GM/10ML suspension; Take 10 mLs (1 g total) by mouth 4 (four) times daily as needed (abdominal pain). -     diltiazem (CARDIZEM SR) 60 MG 12 hr capsule; Take 1 capsule (60 mg total) by mouth 2 (two) times daily.   Differential for abdominal pain currently includes pancreatitis, ischemic heart disease, or gastritis. Suspicion of ischemic heart disease is low on the differential however since it troponin was not ordered In the hospital I can't completely excluded without getting a troponin today. Start sucralfate for the chance that this is just due to gastritis. I'm not completely convinced that she needs to be on diltiazem once we can confirm that her UTI has completely cleared, stepping down to a 12 hour formulation with the possibility of stopping this pending her urine culture.   Return if symptoms worsen or fail to improve.

## 2015-03-25 LAB — LIPASE: Lipase: 42 U/L (ref 0–75)

## 2015-03-25 LAB — TROPONIN I: TROPONIN I: 0.02 ng/mL (ref <0.06)

## 2015-03-26 DIAGNOSIS — I2510 Atherosclerotic heart disease of native coronary artery without angina pectoris: Secondary | ICD-10-CM | POA: Diagnosis not present

## 2015-03-26 DIAGNOSIS — N390 Urinary tract infection, site not specified: Secondary | ICD-10-CM | POA: Diagnosis not present

## 2015-03-26 DIAGNOSIS — G301 Alzheimer's disease with late onset: Secondary | ICD-10-CM | POA: Diagnosis not present

## 2015-03-26 DIAGNOSIS — N183 Chronic kidney disease, stage 3 (moderate): Secondary | ICD-10-CM | POA: Diagnosis not present

## 2015-03-26 DIAGNOSIS — E1122 Type 2 diabetes mellitus with diabetic chronic kidney disease: Secondary | ICD-10-CM | POA: Diagnosis not present

## 2015-03-26 DIAGNOSIS — I129 Hypertensive chronic kidney disease with stage 1 through stage 4 chronic kidney disease, or unspecified chronic kidney disease: Secondary | ICD-10-CM | POA: Diagnosis not present

## 2015-03-26 LAB — URINE CULTURE
COLONY COUNT: NO GROWTH
Organism ID, Bacteria: NO GROWTH

## 2015-03-27 DIAGNOSIS — G301 Alzheimer's disease with late onset: Secondary | ICD-10-CM | POA: Diagnosis not present

## 2015-03-27 DIAGNOSIS — N390 Urinary tract infection, site not specified: Secondary | ICD-10-CM | POA: Diagnosis not present

## 2015-03-27 DIAGNOSIS — I2510 Atherosclerotic heart disease of native coronary artery without angina pectoris: Secondary | ICD-10-CM | POA: Diagnosis not present

## 2015-03-27 DIAGNOSIS — N183 Chronic kidney disease, stage 3 (moderate): Secondary | ICD-10-CM | POA: Diagnosis not present

## 2015-03-27 DIAGNOSIS — I129 Hypertensive chronic kidney disease with stage 1 through stage 4 chronic kidney disease, or unspecified chronic kidney disease: Secondary | ICD-10-CM | POA: Diagnosis not present

## 2015-03-27 DIAGNOSIS — E1122 Type 2 diabetes mellitus with diabetic chronic kidney disease: Secondary | ICD-10-CM | POA: Diagnosis not present

## 2015-04-01 DIAGNOSIS — N390 Urinary tract infection, site not specified: Secondary | ICD-10-CM | POA: Diagnosis not present

## 2015-04-01 DIAGNOSIS — N183 Chronic kidney disease, stage 3 (moderate): Secondary | ICD-10-CM | POA: Diagnosis not present

## 2015-04-01 DIAGNOSIS — I2510 Atherosclerotic heart disease of native coronary artery without angina pectoris: Secondary | ICD-10-CM | POA: Diagnosis not present

## 2015-04-01 DIAGNOSIS — G301 Alzheimer's disease with late onset: Secondary | ICD-10-CM | POA: Diagnosis not present

## 2015-04-01 DIAGNOSIS — E1122 Type 2 diabetes mellitus with diabetic chronic kidney disease: Secondary | ICD-10-CM | POA: Diagnosis not present

## 2015-04-01 DIAGNOSIS — I129 Hypertensive chronic kidney disease with stage 1 through stage 4 chronic kidney disease, or unspecified chronic kidney disease: Secondary | ICD-10-CM | POA: Diagnosis not present

## 2015-04-03 DIAGNOSIS — N183 Chronic kidney disease, stage 3 (moderate): Secondary | ICD-10-CM | POA: Diagnosis not present

## 2015-04-03 DIAGNOSIS — I2510 Atherosclerotic heart disease of native coronary artery without angina pectoris: Secondary | ICD-10-CM | POA: Diagnosis not present

## 2015-04-03 DIAGNOSIS — G301 Alzheimer's disease with late onset: Secondary | ICD-10-CM | POA: Diagnosis not present

## 2015-04-03 DIAGNOSIS — E1122 Type 2 diabetes mellitus with diabetic chronic kidney disease: Secondary | ICD-10-CM | POA: Diagnosis not present

## 2015-04-03 DIAGNOSIS — N390 Urinary tract infection, site not specified: Secondary | ICD-10-CM | POA: Diagnosis not present

## 2015-04-03 DIAGNOSIS — I129 Hypertensive chronic kidney disease with stage 1 through stage 4 chronic kidney disease, or unspecified chronic kidney disease: Secondary | ICD-10-CM | POA: Diagnosis not present

## 2015-04-08 DIAGNOSIS — I2510 Atherosclerotic heart disease of native coronary artery without angina pectoris: Secondary | ICD-10-CM | POA: Diagnosis not present

## 2015-04-08 DIAGNOSIS — E1122 Type 2 diabetes mellitus with diabetic chronic kidney disease: Secondary | ICD-10-CM | POA: Diagnosis not present

## 2015-04-08 DIAGNOSIS — N390 Urinary tract infection, site not specified: Secondary | ICD-10-CM | POA: Diagnosis not present

## 2015-04-08 DIAGNOSIS — N183 Chronic kidney disease, stage 3 (moderate): Secondary | ICD-10-CM | POA: Diagnosis not present

## 2015-04-08 DIAGNOSIS — I129 Hypertensive chronic kidney disease with stage 1 through stage 4 chronic kidney disease, or unspecified chronic kidney disease: Secondary | ICD-10-CM | POA: Diagnosis not present

## 2015-04-08 DIAGNOSIS — G301 Alzheimer's disease with late onset: Secondary | ICD-10-CM | POA: Diagnosis not present

## 2015-04-10 ENCOUNTER — Telehealth: Payer: Self-pay | Admitting: Family Medicine

## 2015-04-10 ENCOUNTER — Encounter: Payer: Self-pay | Admitting: Family Medicine

## 2015-04-10 ENCOUNTER — Ambulatory Visit (INDEPENDENT_AMBULATORY_CARE_PROVIDER_SITE_OTHER): Payer: Medicare Other | Admitting: Family Medicine

## 2015-04-10 VITALS — BP 116/62 | HR 55

## 2015-04-10 DIAGNOSIS — E1121 Type 2 diabetes mellitus with diabetic nephropathy: Secondary | ICD-10-CM | POA: Diagnosis not present

## 2015-04-10 DIAGNOSIS — I129 Hypertensive chronic kidney disease with stage 1 through stage 4 chronic kidney disease, or unspecified chronic kidney disease: Secondary | ICD-10-CM | POA: Diagnosis not present

## 2015-04-10 DIAGNOSIS — N183 Chronic kidney disease, stage 3 (moderate): Secondary | ICD-10-CM | POA: Diagnosis not present

## 2015-04-10 DIAGNOSIS — E1122 Type 2 diabetes mellitus with diabetic chronic kidney disease: Secondary | ICD-10-CM | POA: Diagnosis not present

## 2015-04-10 DIAGNOSIS — N390 Urinary tract infection, site not specified: Secondary | ICD-10-CM | POA: Diagnosis not present

## 2015-04-10 DIAGNOSIS — I2510 Atherosclerotic heart disease of native coronary artery without angina pectoris: Secondary | ICD-10-CM | POA: Diagnosis not present

## 2015-04-10 DIAGNOSIS — R350 Frequency of micturition: Secondary | ICD-10-CM

## 2015-04-10 DIAGNOSIS — G301 Alzheimer's disease with late onset: Secondary | ICD-10-CM | POA: Diagnosis not present

## 2015-04-10 DIAGNOSIS — I701 Atherosclerosis of renal artery: Secondary | ICD-10-CM | POA: Diagnosis not present

## 2015-04-10 DIAGNOSIS — I4892 Unspecified atrial flutter: Secondary | ICD-10-CM

## 2015-04-10 DIAGNOSIS — E1129 Type 2 diabetes mellitus with other diabetic kidney complication: Secondary | ICD-10-CM | POA: Diagnosis not present

## 2015-04-10 LAB — HEMOGLOBIN A1C
HEMOGLOBIN A1C: 7.2 % — AB (ref <5.7)
Mean Plasma Glucose: 160 mg/dL — ABNORMAL HIGH (ref <117)

## 2015-04-10 NOTE — Telephone Encounter (Signed)
[name], Continue please call [facility] on [address] And let them know that I would like for all forms of diltiazem to be discontinued for this patient.

## 2015-04-10 NOTE — Progress Notes (Signed)
CC: Briana Werner is a 79 y.o. female is here for Medication Management   Subjective: HPI:  Follow-up type 2 diabetes: Fasting blood sugars have been ranging from 180-240. She is awakening every 2 hours to urinate in the middle the night and has to go about every 3 hours during the day. She denies polyphagia or polydipsia nor vision loss. Continues to take Januvia and metformin along with Lantus. No changes to her diabetic regimen over the past 6 months.  Follow-up atrial flutter: Continues to take Eliquis, carvedilol and diltiazem. Denies rapid heartbeat or chest pain. Denies edema nor motor or sensory disturbances.  Her major complaint today is fatigue and awakening multiple times to urinate at night. She is taking myrbetriq urinary frequency has deteriorated. She wakes up feeling nonrestorative. She reports fatigue throughout the day but denies shortness of breath or weakness.   Review Of Systems Outlined In HPI  Past Medical History  Diagnosis Date  . Chronic renal insufficiency 02/26/2014  . COPD (chronic obstructive pulmonary disease) 02/26/2014  . Hypothyroid 02/26/2014  . Presence of stent in coronary artery in patient with coronary artery disease 02/26/2014  . Type 2 diabetes mellitus 02/26/2014  . Asthma   . CAD (coronary artery disease)   . Renal artery stenosis   . Atrial flutter   . Cerebrovascular disease     Past Surgical History  Procedure Laterality Date  . Coronary artery bypass graft      2004, [place]  . Hip replacement    . Knee surgery Bilateral   . Back surgery    . Cholecystectomy    . Tonsillectomy    . Appendectomy    . Abdominal hysterectomy    . Shoulder surgery    . Nasal sinus surgery     Family History  Problem Relation Age of Onset  . CAD Father   . Heart attack Father     History   Social History  . Marital Status: Married    Spouse Name: N/A  . Number of Children: 4  . Years of Education: N/A   Occupational History  . Not on file.    Social History Main Topics  . Smoking status: Never Smoker   . Smokeless tobacco: Not on file  . Alcohol Use: No  . Drug Use: No  . Sexual Activity: No   Other Topics Concern  . Not on file   Social History Narrative     Objective: BP 116/62 mmHg  Pulse 55  General: Alert and Oriented, No Acute Distress HEENT: Pupils equal, round, reactive to light. Conjunctivae clear.  Moist mucous membranes Lungs: Clear to auscultation bilaterally, no wheezing/ronchi/rales.  Comfortable work of breathing. Good air movement. Cardiac: irregularly irregular rhythm at a rate less than 100 bpm. Normal S1/S2.  No murmurs, rubs, nor gallops.   Extremities: No peripheral edema.  Strong peripheral pulses.  Mental Status: No depression, anxiety, nor agitation. Skin: Warm and dry.  Assessment & Plan: Briana Werner was seen today for medication management.  Diagnoses and all orders for this visit:  Type 2 diabetes mellitus with diabetic nephropathy Orders: -     Hemoglobin A1c -     Urinalysis, Routine w reflex microscopic  Urinary frequency  Chronic atrial flutter   Atrial flutter: I do not think that she needs to be on diltiazem anymore this is likely causing some of her fatigue. This will be discontinued. Continue Eliquis and carvedilol Urinary frequency: Rule out UTI, suspect this is due to uncontrolled diabetes  Type 2 diabetes: uncontrolled chronic condition, Check an A1c to determine how aggressively I can increase her Lantus.   Return if symptoms worsen or fail to improve.

## 2015-04-11 ENCOUNTER — Telehealth: Payer: Self-pay | Admitting: Family Medicine

## 2015-04-11 ENCOUNTER — Other Ambulatory Visit: Payer: Self-pay | Admitting: *Deleted

## 2015-04-11 DIAGNOSIS — M4310 Spondylolisthesis, site unspecified: Secondary | ICD-10-CM

## 2015-04-11 LAB — URINALYSIS, ROUTINE W REFLEX MICROSCOPIC
Bilirubin Urine: NEGATIVE
Glucose, UA: NEGATIVE
HGB URINE DIPSTICK: NEGATIVE
Ketones, ur: NEGATIVE
Nitrite: NEGATIVE
PH: 5.5 (ref 5.0–8.0)
Protein, ur: NEGATIVE
SPECIFIC GRAVITY, URINE: 1.016 (ref 1.001–1.035)

## 2015-04-11 LAB — URINALYSIS, MICROSCOPIC ONLY
CRYSTALS: NONE SEEN [HPF]
Yeast: NONE SEEN [HPF]

## 2015-04-11 MED ORDER — SULFAMETHOXAZOLE-TRIMETHOPRIM 800-160 MG PO TABS: 1.0000 | ORAL_TABLET | Freq: Two times a day (BID) | ORAL | Status: DC

## 2015-04-11 MED ORDER — INSULIN GLARGINE 100 UNIT/ML SOLOSTAR PEN: 20.0000 [IU] | PEN_INJECTOR | Freq: Every day | SUBCUTANEOUS | Status: DC

## 2015-04-11 MED ORDER — OXYCODONE-ACETAMINOPHEN 10-325 MG PO TABS: 0.5000 | ORAL_TABLET | Freq: Four times a day (QID) | ORAL | Status: DC | PRN

## 2015-04-11 NOTE — Telephone Encounter (Signed)
Message left on vm 

## 2015-04-11 NOTE — Telephone Encounter (Signed)
[name], Will you please let patient know that her urine had some bacteria in it and I'd reocmmend starting a rx of trimethoprim-sulfa that I [facility].  Also I'd recommend increasing lantus to 20 units daily.  I appreciate her bringing her blood sugar log by, it's ready for her to pick up, in your in box.

## 2015-04-12 ENCOUNTER — Other Ambulatory Visit: Payer: Self-pay | Admitting: Family Medicine

## 2015-04-15 DIAGNOSIS — G301 Alzheimer's disease with late onset: Secondary | ICD-10-CM | POA: Diagnosis not present

## 2015-04-15 DIAGNOSIS — E1122 Type 2 diabetes mellitus with diabetic chronic kidney disease: Secondary | ICD-10-CM | POA: Diagnosis not present

## 2015-04-15 DIAGNOSIS — N183 Chronic kidney disease, stage 3 (moderate): Secondary | ICD-10-CM | POA: Diagnosis not present

## 2015-04-15 DIAGNOSIS — I129 Hypertensive chronic kidney disease with stage 1 through stage 4 chronic kidney disease, or unspecified chronic kidney disease: Secondary | ICD-10-CM | POA: Diagnosis not present

## 2015-04-15 DIAGNOSIS — N390 Urinary tract infection, site not specified: Secondary | ICD-10-CM | POA: Diagnosis not present

## 2015-04-15 DIAGNOSIS — I2510 Atherosclerotic heart disease of native coronary artery without angina pectoris: Secondary | ICD-10-CM | POA: Diagnosis not present

## 2015-04-17 DIAGNOSIS — N183 Chronic kidney disease, stage 3 (moderate): Secondary | ICD-10-CM | POA: Diagnosis not present

## 2015-04-17 DIAGNOSIS — I129 Hypertensive chronic kidney disease with stage 1 through stage 4 chronic kidney disease, or unspecified chronic kidney disease: Secondary | ICD-10-CM | POA: Diagnosis not present

## 2015-04-17 DIAGNOSIS — G301 Alzheimer's disease with late onset: Secondary | ICD-10-CM | POA: Diagnosis not present

## 2015-04-17 DIAGNOSIS — I2510 Atherosclerotic heart disease of native coronary artery without angina pectoris: Secondary | ICD-10-CM | POA: Diagnosis not present

## 2015-04-17 DIAGNOSIS — N390 Urinary tract infection, site not specified: Secondary | ICD-10-CM | POA: Diagnosis not present

## 2015-04-17 DIAGNOSIS — E1122 Type 2 diabetes mellitus with diabetic chronic kidney disease: Secondary | ICD-10-CM | POA: Diagnosis not present

## 2015-04-21 ENCOUNTER — Ambulatory Visit (INDEPENDENT_AMBULATORY_CARE_PROVIDER_SITE_OTHER): Payer: Medicare Other | Admitting: Family Medicine

## 2015-04-21 ENCOUNTER — Encounter: Payer: Self-pay | Admitting: Family Medicine

## 2015-04-21 VITALS — BP 117/59 | HR 82 | Wt 157.0 lb

## 2015-04-21 DIAGNOSIS — I701 Atherosclerosis of renal artery: Secondary | ICD-10-CM | POA: Diagnosis not present

## 2015-04-21 DIAGNOSIS — E1121 Type 2 diabetes mellitus with diabetic nephropathy: Secondary | ICD-10-CM

## 2015-04-21 DIAGNOSIS — N3281 Overactive bladder: Secondary | ICD-10-CM | POA: Diagnosis not present

## 2015-04-21 MED ORDER — TOLTERODINE TARTRATE ER 2 MG PO CP24: 2.0000 mg | ORAL_CAPSULE | Freq: Every day | ORAL | Status: DC

## 2015-04-21 NOTE — Progress Notes (Signed)
CC: Briana Werner is a 79 y.o. female is here for f/u DM?   Subjective: HPI:   Follow-up type 2 diabetes: Fasting blood sugars ranging from 105-126. Currently taking 20 units of Lantus every evening. Denies any hyperglycemic episodes. No  polyphagia or polydipsia.still awakening every 2 hours to urinate at night. Feels poorly rested in the morning more quickly she gets up the night before. Denies vision loss.  She would like to know if there is something other than Vesicare or Myrbetric to help with an overactive bladder, both of these have been ineffective. She reports urgency and occasionally will have urge incontinence if she does not make in the bathroom on time. She denies any dysuria or change in the odor color or consistency of her urine.   Review Of Systems Outlined In HPI  Past Medical History  Diagnosis Date  . Chronic renal insufficiency 02/26/2014  . COPD (chronic obstructive pulmonary disease) 02/26/2014  . Hypothyroid 02/26/2014  . Presence of stent in coronary artery in patient with coronary artery disease 02/26/2014  . Type 2 diabetes mellitus 02/26/2014  . Asthma   . CAD (coronary artery disease)   . Renal artery stenosis   . Atrial flutter   . Cerebrovascular disease     Past Surgical History  Procedure Laterality Date  . Coronary artery bypass graft      2004, [place]  . Hip replacement    . Knee surgery Bilateral   . Back surgery    . Cholecystectomy    . Tonsillectomy    . Appendectomy    . Abdominal hysterectomy    . Shoulder surgery    . Nasal sinus surgery     Family History  Problem Relation Age of Onset  . CAD Father   . Heart attack Father     History   Social History  . Marital Status: Married    Spouse Name: N/A  . Number of Children: 4  . Years of Education: N/A   Occupational History  . Not on file.   Social History Main Topics  . Smoking status: Never Smoker   . Smokeless tobacco: Not on file  . Alcohol Use: No  . Drug Use: No  .  Sexual Activity: No   Other Topics Concern  . Not on file   Social History Narrative     Objective: BP 117/59 mmHg  Pulse 82  Wt 157 lb (71.215 kg)  Vital signs reviewed. General: Alert and Oriented, No Acute Distress HEENT: Pupils equal, round, reactive to light. Conjunctivae clear.  External ears unremarkable.  Moist mucous membranes. Lungs: Clear and comfortable work of breathing, speaking in full sentences without accessory muscle use. Cardiac: irregularly irregular rhythm at a rate less than 100 Neuro: CN II-XII grossly intact, gait normal. Extremities: No peripheral edema.  Strong peripheral pulses.  Mental Status: No depression, anxiety, nor agitation. Logical though process. Skin: Warm and dry.  Assessment & Plan: Briana Werner was seen today for f/u dm?.  Diagnoses and all orders for this visit:  Overactive bladder Orders: -     tolterodine (DETROL LA) 2 MG 24 hr capsule; Take 1 capsule (2 mg total) by mouth daily.  Type 2 diabetes mellitus with diabetic nephropathy   Overactive bladder: Stopping myrbetric, switching to Detrol LA Type 2 diabetes: Control improved continue 20 units of Lantus every evening.  25 minutes spent face-to-face during visit today of which at least 50% was counseling or coordinating care regarding: 1. Overactive bladder  2. Type 2 diabetes mellitus with diabetic nephropathy      Return in about 2 months (around 06/21/2015).

## 2015-04-22 ENCOUNTER — Telehealth: Payer: Self-pay | Admitting: *Deleted

## 2015-04-22 DIAGNOSIS — E1122 Type 2 diabetes mellitus with diabetic chronic kidney disease: Secondary | ICD-10-CM | POA: Diagnosis not present

## 2015-04-22 DIAGNOSIS — I129 Hypertensive chronic kidney disease with stage 1 through stage 4 chronic kidney disease, or unspecified chronic kidney disease: Secondary | ICD-10-CM | POA: Diagnosis not present

## 2015-04-22 DIAGNOSIS — N183 Chronic kidney disease, stage 3 (moderate): Secondary | ICD-10-CM | POA: Diagnosis not present

## 2015-04-22 DIAGNOSIS — N390 Urinary tract infection, site not specified: Secondary | ICD-10-CM | POA: Diagnosis not present

## 2015-04-22 DIAGNOSIS — I2510 Atherosclerotic heart disease of native coronary artery without angina pectoris: Secondary | ICD-10-CM | POA: Diagnosis not present

## 2015-04-22 DIAGNOSIS — G301 Alzheimer's disease with late onset: Secondary | ICD-10-CM | POA: Diagnosis not present

## 2015-04-22 MED ORDER — AMBULATORY NON FORMULARY MEDICATION: Status: DC

## 2015-04-24 DIAGNOSIS — E1122 Type 2 diabetes mellitus with diabetic chronic kidney disease: Secondary | ICD-10-CM | POA: Diagnosis not present

## 2015-04-24 DIAGNOSIS — N183 Chronic kidney disease, stage 3 (moderate): Secondary | ICD-10-CM | POA: Diagnosis not present

## 2015-04-24 DIAGNOSIS — N390 Urinary tract infection, site not specified: Secondary | ICD-10-CM | POA: Diagnosis not present

## 2015-04-24 DIAGNOSIS — I2510 Atherosclerotic heart disease of native coronary artery without angina pectoris: Secondary | ICD-10-CM | POA: Diagnosis not present

## 2015-04-24 DIAGNOSIS — G301 Alzheimer's disease with late onset: Secondary | ICD-10-CM | POA: Diagnosis not present

## 2015-04-24 DIAGNOSIS — I129 Hypertensive chronic kidney disease with stage 1 through stage 4 chronic kidney disease, or unspecified chronic kidney disease: Secondary | ICD-10-CM | POA: Diagnosis not present

## 2015-04-29 DIAGNOSIS — I129 Hypertensive chronic kidney disease with stage 1 through stage 4 chronic kidney disease, or unspecified chronic kidney disease: Secondary | ICD-10-CM | POA: Diagnosis not present

## 2015-04-29 DIAGNOSIS — I2510 Atherosclerotic heart disease of native coronary artery without angina pectoris: Secondary | ICD-10-CM | POA: Diagnosis not present

## 2015-04-29 DIAGNOSIS — E1122 Type 2 diabetes mellitus with diabetic chronic kidney disease: Secondary | ICD-10-CM | POA: Diagnosis not present

## 2015-04-29 DIAGNOSIS — N390 Urinary tract infection, site not specified: Secondary | ICD-10-CM | POA: Diagnosis not present

## 2015-04-29 DIAGNOSIS — G301 Alzheimer's disease with late onset: Secondary | ICD-10-CM | POA: Diagnosis not present

## 2015-04-29 DIAGNOSIS — N183 Chronic kidney disease, stage 3 (moderate): Secondary | ICD-10-CM | POA: Diagnosis not present

## 2015-05-02 DIAGNOSIS — E1122 Type 2 diabetes mellitus with diabetic chronic kidney disease: Secondary | ICD-10-CM | POA: Diagnosis not present

## 2015-05-02 DIAGNOSIS — I2510 Atherosclerotic heart disease of native coronary artery without angina pectoris: Secondary | ICD-10-CM | POA: Diagnosis not present

## 2015-05-02 DIAGNOSIS — G301 Alzheimer's disease with late onset: Secondary | ICD-10-CM | POA: Diagnosis not present

## 2015-05-02 DIAGNOSIS — I129 Hypertensive chronic kidney disease with stage 1 through stage 4 chronic kidney disease, or unspecified chronic kidney disease: Secondary | ICD-10-CM | POA: Diagnosis not present

## 2015-05-02 DIAGNOSIS — N390 Urinary tract infection, site not specified: Secondary | ICD-10-CM | POA: Diagnosis not present

## 2015-05-02 DIAGNOSIS — N183 Chronic kidney disease, stage 3 (moderate): Secondary | ICD-10-CM | POA: Diagnosis not present

## 2015-05-06 DIAGNOSIS — N390 Urinary tract infection, site not specified: Secondary | ICD-10-CM | POA: Diagnosis not present

## 2015-05-06 DIAGNOSIS — I129 Hypertensive chronic kidney disease with stage 1 through stage 4 chronic kidney disease, or unspecified chronic kidney disease: Secondary | ICD-10-CM | POA: Diagnosis not present

## 2015-05-06 DIAGNOSIS — G301 Alzheimer's disease with late onset: Secondary | ICD-10-CM | POA: Diagnosis not present

## 2015-05-06 DIAGNOSIS — N183 Chronic kidney disease, stage 3 (moderate): Secondary | ICD-10-CM | POA: Diagnosis not present

## 2015-05-06 DIAGNOSIS — E1122 Type 2 diabetes mellitus with diabetic chronic kidney disease: Secondary | ICD-10-CM | POA: Diagnosis not present

## 2015-05-06 DIAGNOSIS — I2510 Atherosclerotic heart disease of native coronary artery without angina pectoris: Secondary | ICD-10-CM | POA: Diagnosis not present

## 2015-05-06 NOTE — Telephone Encounter (Signed)
closed

## 2015-05-09 DIAGNOSIS — E1142 Type 2 diabetes mellitus with diabetic polyneuropathy: Secondary | ICD-10-CM | POA: Diagnosis not present

## 2015-05-09 DIAGNOSIS — F0280 Dementia in other diseases classified elsewhere without behavioral disturbance: Secondary | ICD-10-CM | POA: Diagnosis not present

## 2015-05-09 DIAGNOSIS — I4891 Unspecified atrial fibrillation: Secondary | ICD-10-CM | POA: Diagnosis not present

## 2015-05-09 DIAGNOSIS — E1122 Type 2 diabetes mellitus with diabetic chronic kidney disease: Secondary | ICD-10-CM | POA: Diagnosis not present

## 2015-05-09 DIAGNOSIS — I2510 Atherosclerotic heart disease of native coronary artery without angina pectoris: Secondary | ICD-10-CM | POA: Diagnosis not present

## 2015-05-09 DIAGNOSIS — N183 Chronic kidney disease, stage 3 (moderate): Secondary | ICD-10-CM | POA: Diagnosis not present

## 2015-05-09 DIAGNOSIS — N390 Urinary tract infection, site not specified: Secondary | ICD-10-CM | POA: Diagnosis not present

## 2015-05-09 DIAGNOSIS — G301 Alzheimer's disease with late onset: Secondary | ICD-10-CM | POA: Diagnosis not present

## 2015-05-09 DIAGNOSIS — Z9181 History of falling: Secondary | ICD-10-CM | POA: Diagnosis not present

## 2015-05-09 DIAGNOSIS — I129 Hypertensive chronic kidney disease with stage 1 through stage 4 chronic kidney disease, or unspecified chronic kidney disease: Secondary | ICD-10-CM | POA: Diagnosis not present

## 2015-05-09 DIAGNOSIS — J449 Chronic obstructive pulmonary disease, unspecified: Secondary | ICD-10-CM | POA: Diagnosis not present

## 2015-05-14 DIAGNOSIS — N182 Chronic kidney disease, stage 2 (mild): Secondary | ICD-10-CM | POA: Diagnosis not present

## 2015-05-15 DIAGNOSIS — E1122 Type 2 diabetes mellitus with diabetic chronic kidney disease: Secondary | ICD-10-CM | POA: Diagnosis not present

## 2015-05-15 DIAGNOSIS — N183 Chronic kidney disease, stage 3 (moderate): Secondary | ICD-10-CM | POA: Diagnosis not present

## 2015-05-15 DIAGNOSIS — I2510 Atherosclerotic heart disease of native coronary artery without angina pectoris: Secondary | ICD-10-CM | POA: Diagnosis not present

## 2015-05-15 DIAGNOSIS — I129 Hypertensive chronic kidney disease with stage 1 through stage 4 chronic kidney disease, or unspecified chronic kidney disease: Secondary | ICD-10-CM | POA: Diagnosis not present

## 2015-05-15 DIAGNOSIS — G301 Alzheimer's disease with late onset: Secondary | ICD-10-CM | POA: Diagnosis not present

## 2015-05-15 DIAGNOSIS — N390 Urinary tract infection, site not specified: Secondary | ICD-10-CM | POA: Diagnosis not present

## 2015-05-16 DIAGNOSIS — G301 Alzheimer's disease with late onset: Secondary | ICD-10-CM | POA: Diagnosis not present

## 2015-05-16 DIAGNOSIS — N183 Chronic kidney disease, stage 3 (moderate): Secondary | ICD-10-CM | POA: Diagnosis not present

## 2015-05-16 DIAGNOSIS — N390 Urinary tract infection, site not specified: Secondary | ICD-10-CM | POA: Diagnosis not present

## 2015-05-16 DIAGNOSIS — I129 Hypertensive chronic kidney disease with stage 1 through stage 4 chronic kidney disease, or unspecified chronic kidney disease: Secondary | ICD-10-CM | POA: Diagnosis not present

## 2015-05-16 DIAGNOSIS — I2510 Atherosclerotic heart disease of native coronary artery without angina pectoris: Secondary | ICD-10-CM | POA: Diagnosis not present

## 2015-05-16 DIAGNOSIS — E1122 Type 2 diabetes mellitus with diabetic chronic kidney disease: Secondary | ICD-10-CM | POA: Diagnosis not present

## 2015-05-19 DIAGNOSIS — G301 Alzheimer's disease with late onset: Secondary | ICD-10-CM | POA: Diagnosis not present

## 2015-05-19 DIAGNOSIS — E1122 Type 2 diabetes mellitus with diabetic chronic kidney disease: Secondary | ICD-10-CM | POA: Diagnosis not present

## 2015-05-19 DIAGNOSIS — N390 Urinary tract infection, site not specified: Secondary | ICD-10-CM | POA: Diagnosis not present

## 2015-05-19 DIAGNOSIS — I2510 Atherosclerotic heart disease of native coronary artery without angina pectoris: Secondary | ICD-10-CM | POA: Diagnosis not present

## 2015-05-19 DIAGNOSIS — I129 Hypertensive chronic kidney disease with stage 1 through stage 4 chronic kidney disease, or unspecified chronic kidney disease: Secondary | ICD-10-CM | POA: Diagnosis not present

## 2015-05-19 DIAGNOSIS — N183 Chronic kidney disease, stage 3 (moderate): Secondary | ICD-10-CM | POA: Diagnosis not present

## 2015-05-23 DIAGNOSIS — I129 Hypertensive chronic kidney disease with stage 1 through stage 4 chronic kidney disease, or unspecified chronic kidney disease: Secondary | ICD-10-CM | POA: Diagnosis not present

## 2015-05-23 DIAGNOSIS — E1122 Type 2 diabetes mellitus with diabetic chronic kidney disease: Secondary | ICD-10-CM | POA: Diagnosis not present

## 2015-05-23 DIAGNOSIS — N183 Chronic kidney disease, stage 3 (moderate): Secondary | ICD-10-CM | POA: Diagnosis not present

## 2015-05-23 DIAGNOSIS — I2510 Atherosclerotic heart disease of native coronary artery without angina pectoris: Secondary | ICD-10-CM | POA: Diagnosis not present

## 2015-05-23 DIAGNOSIS — G301 Alzheimer's disease with late onset: Secondary | ICD-10-CM | POA: Diagnosis not present

## 2015-05-23 DIAGNOSIS — N390 Urinary tract infection, site not specified: Secondary | ICD-10-CM | POA: Diagnosis not present

## 2015-05-28 DIAGNOSIS — I129 Hypertensive chronic kidney disease with stage 1 through stage 4 chronic kidney disease, or unspecified chronic kidney disease: Secondary | ICD-10-CM | POA: Diagnosis not present

## 2015-05-28 DIAGNOSIS — E1122 Type 2 diabetes mellitus with diabetic chronic kidney disease: Secondary | ICD-10-CM | POA: Diagnosis not present

## 2015-05-28 DIAGNOSIS — I2510 Atherosclerotic heart disease of native coronary artery without angina pectoris: Secondary | ICD-10-CM | POA: Diagnosis not present

## 2015-05-28 DIAGNOSIS — N183 Chronic kidney disease, stage 3 (moderate): Secondary | ICD-10-CM | POA: Diagnosis not present

## 2015-05-28 DIAGNOSIS — G301 Alzheimer's disease with late onset: Secondary | ICD-10-CM | POA: Diagnosis not present

## 2015-05-28 DIAGNOSIS — N390 Urinary tract infection, site not specified: Secondary | ICD-10-CM | POA: Diagnosis not present

## 2015-06-02 ENCOUNTER — Other Ambulatory Visit: Payer: Self-pay | Admitting: Family Medicine

## 2015-06-03 ENCOUNTER — Other Ambulatory Visit: Payer: Self-pay | Admitting: Family Medicine

## 2015-06-03 NOTE — Telephone Encounter (Signed)
Is this patient supposed to still be on lisinopril? i just asked because I didn't see it mentioned at any other visits besides a visit in April. If she was taking it as prescribed the refill should have run out in July. I  just want to make sure that she is definitely supposed to be on this medication before it's refilled

## 2015-06-04 DIAGNOSIS — N390 Urinary tract infection, site not specified: Secondary | ICD-10-CM | POA: Diagnosis not present

## 2015-06-04 DIAGNOSIS — N183 Chronic kidney disease, stage 3 (moderate): Secondary | ICD-10-CM | POA: Diagnosis not present

## 2015-06-04 DIAGNOSIS — I2510 Atherosclerotic heart disease of native coronary artery without angina pectoris: Secondary | ICD-10-CM | POA: Diagnosis not present

## 2015-06-04 DIAGNOSIS — E1122 Type 2 diabetes mellitus with diabetic chronic kidney disease: Secondary | ICD-10-CM | POA: Diagnosis not present

## 2015-06-04 DIAGNOSIS — I129 Hypertensive chronic kidney disease with stage 1 through stage 4 chronic kidney disease, or unspecified chronic kidney disease: Secondary | ICD-10-CM | POA: Diagnosis not present

## 2015-06-04 DIAGNOSIS — G301 Alzheimer's disease with late onset: Secondary | ICD-10-CM | POA: Diagnosis not present

## 2015-06-04 NOTE — Telephone Encounter (Signed)
[name], All refills approved and sent to her pharmacy.

## 2015-06-05 DIAGNOSIS — I129 Hypertensive chronic kidney disease with stage 1 through stage 4 chronic kidney disease, or unspecified chronic kidney disease: Secondary | ICD-10-CM | POA: Diagnosis not present

## 2015-06-05 DIAGNOSIS — I2510 Atherosclerotic heart disease of native coronary artery without angina pectoris: Secondary | ICD-10-CM | POA: Diagnosis not present

## 2015-06-05 DIAGNOSIS — N183 Chronic kidney disease, stage 3 (moderate): Secondary | ICD-10-CM | POA: Diagnosis not present

## 2015-06-05 DIAGNOSIS — E1122 Type 2 diabetes mellitus with diabetic chronic kidney disease: Secondary | ICD-10-CM | POA: Diagnosis not present

## 2015-06-05 DIAGNOSIS — N390 Urinary tract infection, site not specified: Secondary | ICD-10-CM | POA: Diagnosis not present

## 2015-06-05 DIAGNOSIS — G301 Alzheimer's disease with late onset: Secondary | ICD-10-CM | POA: Diagnosis not present

## 2015-06-10 DIAGNOSIS — I129 Hypertensive chronic kidney disease with stage 1 through stage 4 chronic kidney disease, or unspecified chronic kidney disease: Secondary | ICD-10-CM | POA: Diagnosis not present

## 2015-06-10 DIAGNOSIS — E1122 Type 2 diabetes mellitus with diabetic chronic kidney disease: Secondary | ICD-10-CM | POA: Diagnosis not present

## 2015-06-10 DIAGNOSIS — G301 Alzheimer's disease with late onset: Secondary | ICD-10-CM | POA: Diagnosis not present

## 2015-06-10 DIAGNOSIS — I2510 Atherosclerotic heart disease of native coronary artery without angina pectoris: Secondary | ICD-10-CM | POA: Diagnosis not present

## 2015-06-10 DIAGNOSIS — N183 Chronic kidney disease, stage 3 (moderate): Secondary | ICD-10-CM | POA: Diagnosis not present

## 2015-06-10 DIAGNOSIS — N390 Urinary tract infection, site not specified: Secondary | ICD-10-CM | POA: Diagnosis not present

## 2015-06-13 DIAGNOSIS — E1122 Type 2 diabetes mellitus with diabetic chronic kidney disease: Secondary | ICD-10-CM | POA: Diagnosis not present

## 2015-06-13 DIAGNOSIS — N183 Chronic kidney disease, stage 3 (moderate): Secondary | ICD-10-CM | POA: Diagnosis not present

## 2015-06-13 DIAGNOSIS — I129 Hypertensive chronic kidney disease with stage 1 through stage 4 chronic kidney disease, or unspecified chronic kidney disease: Secondary | ICD-10-CM | POA: Diagnosis not present

## 2015-06-13 DIAGNOSIS — G301 Alzheimer's disease with late onset: Secondary | ICD-10-CM | POA: Diagnosis not present

## 2015-06-13 DIAGNOSIS — N390 Urinary tract infection, site not specified: Secondary | ICD-10-CM | POA: Diagnosis not present

## 2015-06-13 DIAGNOSIS — I2510 Atherosclerotic heart disease of native coronary artery without angina pectoris: Secondary | ICD-10-CM | POA: Diagnosis not present

## 2015-06-16 DIAGNOSIS — E1122 Type 2 diabetes mellitus with diabetic chronic kidney disease: Secondary | ICD-10-CM | POA: Diagnosis not present

## 2015-06-16 DIAGNOSIS — N183 Chronic kidney disease, stage 3 (moderate): Secondary | ICD-10-CM | POA: Diagnosis not present

## 2015-06-16 DIAGNOSIS — I2510 Atherosclerotic heart disease of native coronary artery without angina pectoris: Secondary | ICD-10-CM | POA: Diagnosis not present

## 2015-06-16 DIAGNOSIS — N390 Urinary tract infection, site not specified: Secondary | ICD-10-CM | POA: Diagnosis not present

## 2015-06-16 DIAGNOSIS — G301 Alzheimer's disease with late onset: Secondary | ICD-10-CM | POA: Diagnosis not present

## 2015-06-16 DIAGNOSIS — I129 Hypertensive chronic kidney disease with stage 1 through stage 4 chronic kidney disease, or unspecified chronic kidney disease: Secondary | ICD-10-CM | POA: Diagnosis not present

## 2015-06-17 ENCOUNTER — Other Ambulatory Visit: Payer: Self-pay

## 2015-06-17 DIAGNOSIS — M4310 Spondylolisthesis, site unspecified: Secondary | ICD-10-CM

## 2015-06-17 MED ORDER — OXYCODONE-ACETAMINOPHEN 10-325 MG PO TABS: 0.5000 | ORAL_TABLET | Freq: Four times a day (QID) | ORAL | Status: DC | PRN

## 2015-06-19 ENCOUNTER — Other Ambulatory Visit: Payer: Self-pay | Admitting: Family Medicine

## 2015-06-24 DIAGNOSIS — G301 Alzheimer's disease with late onset: Secondary | ICD-10-CM | POA: Diagnosis not present

## 2015-06-24 DIAGNOSIS — N390 Urinary tract infection, site not specified: Secondary | ICD-10-CM | POA: Diagnosis not present

## 2015-06-24 DIAGNOSIS — I2510 Atherosclerotic heart disease of native coronary artery without angina pectoris: Secondary | ICD-10-CM | POA: Diagnosis not present

## 2015-06-24 DIAGNOSIS — E1122 Type 2 diabetes mellitus with diabetic chronic kidney disease: Secondary | ICD-10-CM | POA: Diagnosis not present

## 2015-06-24 DIAGNOSIS — N183 Chronic kidney disease, stage 3 (moderate): Secondary | ICD-10-CM | POA: Diagnosis not present

## 2015-06-24 DIAGNOSIS — I129 Hypertensive chronic kidney disease with stage 1 through stage 4 chronic kidney disease, or unspecified chronic kidney disease: Secondary | ICD-10-CM | POA: Diagnosis not present

## 2015-06-26 DIAGNOSIS — N183 Chronic kidney disease, stage 3 (moderate): Secondary | ICD-10-CM | POA: Diagnosis not present

## 2015-06-26 DIAGNOSIS — I129 Hypertensive chronic kidney disease with stage 1 through stage 4 chronic kidney disease, or unspecified chronic kidney disease: Secondary | ICD-10-CM | POA: Diagnosis not present

## 2015-06-26 DIAGNOSIS — I2510 Atherosclerotic heart disease of native coronary artery without angina pectoris: Secondary | ICD-10-CM | POA: Diagnosis not present

## 2015-06-26 DIAGNOSIS — N390 Urinary tract infection, site not specified: Secondary | ICD-10-CM | POA: Diagnosis not present

## 2015-06-26 DIAGNOSIS — E1122 Type 2 diabetes mellitus with diabetic chronic kidney disease: Secondary | ICD-10-CM | POA: Diagnosis not present

## 2015-06-26 DIAGNOSIS — G301 Alzheimer's disease with late onset: Secondary | ICD-10-CM | POA: Diagnosis not present

## 2015-06-30 DIAGNOSIS — N390 Urinary tract infection, site not specified: Secondary | ICD-10-CM | POA: Diagnosis not present

## 2015-06-30 DIAGNOSIS — E1122 Type 2 diabetes mellitus with diabetic chronic kidney disease: Secondary | ICD-10-CM | POA: Diagnosis not present

## 2015-06-30 DIAGNOSIS — I129 Hypertensive chronic kidney disease with stage 1 through stage 4 chronic kidney disease, or unspecified chronic kidney disease: Secondary | ICD-10-CM | POA: Diagnosis not present

## 2015-06-30 DIAGNOSIS — I2510 Atherosclerotic heart disease of native coronary artery without angina pectoris: Secondary | ICD-10-CM | POA: Diagnosis not present

## 2015-06-30 DIAGNOSIS — G301 Alzheimer's disease with late onset: Secondary | ICD-10-CM | POA: Diagnosis not present

## 2015-06-30 DIAGNOSIS — N183 Chronic kidney disease, stage 3 (moderate): Secondary | ICD-10-CM | POA: Diagnosis not present

## 2015-07-02 DIAGNOSIS — N183 Chronic kidney disease, stage 3 (moderate): Secondary | ICD-10-CM | POA: Diagnosis not present

## 2015-07-02 DIAGNOSIS — E1122 Type 2 diabetes mellitus with diabetic chronic kidney disease: Secondary | ICD-10-CM | POA: Diagnosis not present

## 2015-07-02 DIAGNOSIS — I2510 Atherosclerotic heart disease of native coronary artery without angina pectoris: Secondary | ICD-10-CM | POA: Diagnosis not present

## 2015-07-02 DIAGNOSIS — G301 Alzheimer's disease with late onset: Secondary | ICD-10-CM | POA: Diagnosis not present

## 2015-07-02 DIAGNOSIS — N390 Urinary tract infection, site not specified: Secondary | ICD-10-CM | POA: Diagnosis not present

## 2015-07-02 DIAGNOSIS — I129 Hypertensive chronic kidney disease with stage 1 through stage 4 chronic kidney disease, or unspecified chronic kidney disease: Secondary | ICD-10-CM | POA: Diagnosis not present

## 2015-07-04 ENCOUNTER — Other Ambulatory Visit: Payer: Self-pay | Admitting: Family Medicine

## 2015-07-07 DIAGNOSIS — E1122 Type 2 diabetes mellitus with diabetic chronic kidney disease: Secondary | ICD-10-CM | POA: Diagnosis not present

## 2015-07-07 DIAGNOSIS — G301 Alzheimer's disease with late onset: Secondary | ICD-10-CM | POA: Diagnosis not present

## 2015-07-07 DIAGNOSIS — N390 Urinary tract infection, site not specified: Secondary | ICD-10-CM | POA: Diagnosis not present

## 2015-07-07 DIAGNOSIS — I2510 Atherosclerotic heart disease of native coronary artery without angina pectoris: Secondary | ICD-10-CM | POA: Diagnosis not present

## 2015-07-07 DIAGNOSIS — N183 Chronic kidney disease, stage 3 (moderate): Secondary | ICD-10-CM | POA: Diagnosis not present

## 2015-07-07 DIAGNOSIS — I129 Hypertensive chronic kidney disease with stage 1 through stage 4 chronic kidney disease, or unspecified chronic kidney disease: Secondary | ICD-10-CM | POA: Diagnosis not present

## 2015-07-18 ENCOUNTER — Telehealth: Payer: Self-pay

## 2015-07-18 MED ORDER — FLUCONAZOLE 150 MG PO TABS
ORAL_TABLET | ORAL | Status: AC
Start: 2015-07-18 — End: 2015-07-24

## 2015-07-18 NOTE — Telephone Encounter (Signed)
Pt advised.

## 2015-07-18 NOTE — Telephone Encounter (Signed)
Pleas let patient know that a Rx of diflucan was sent to her [facility] pharmacy

## 2015-07-18 NOTE — Telephone Encounter (Signed)
Pt called reporting that she has a yeast infection and would like something called to the pharmacy.

## 2015-07-26 ENCOUNTER — Other Ambulatory Visit: Payer: Self-pay | Admitting: Family Medicine

## 2015-07-28 ENCOUNTER — Ambulatory Visit (INDEPENDENT_AMBULATORY_CARE_PROVIDER_SITE_OTHER): Payer: Medicare Other | Admitting: Family Medicine

## 2015-07-28 ENCOUNTER — Encounter: Payer: Self-pay | Admitting: Family Medicine

## 2015-07-28 VITALS — BP 144/75 | HR 88 | Wt 167.0 lb

## 2015-07-28 DIAGNOSIS — M4310 Spondylolisthesis, site unspecified: Secondary | ICD-10-CM

## 2015-07-28 DIAGNOSIS — I701 Atherosclerosis of renal artery: Secondary | ICD-10-CM

## 2015-07-28 DIAGNOSIS — R5383 Other fatigue: Secondary | ICD-10-CM | POA: Diagnosis not present

## 2015-07-28 DIAGNOSIS — N3281 Overactive bladder: Secondary | ICD-10-CM

## 2015-07-28 DIAGNOSIS — E1121 Type 2 diabetes mellitus with diabetic nephropathy: Secondary | ICD-10-CM

## 2015-07-28 LAB — POCT GLYCOSYLATED HEMOGLOBIN (HGB A1C): HEMOGLOBIN A1C: 5.3

## 2015-07-28 MED ORDER — INSULIN GLARGINE 300 UNIT/ML ~~LOC~~ SOPN: 15.0000 [IU] | PEN_INJECTOR | Freq: Every day | SUBCUTANEOUS | Status: DC

## 2015-07-28 MED ORDER — OXYCODONE-ACETAMINOPHEN 10-325 MG PO TABS: 0.5000 | ORAL_TABLET | Freq: Four times a day (QID) | ORAL | Status: DC | PRN

## 2015-07-28 NOTE — Progress Notes (Signed)
CC: Briana Werner is a 79 y.o. female is here for Hyperglycemia; Fatigue; and Hot Flashes   Subjective: HPI:  She has concerns regarding her blood sugars which over the last 14 days have been variable between 120-219 with the majority of them in the upper 100s. She's checking a fasting and 2 hours after meal. She's been checking frequently because she's felt hot flashes and fatigue over the past 2 weeks. She's tried her best to cut back on starches and carbohydrates. Even when she is at her best with respect to diet her blood sugar is still elevated. Symptoms are slowly worsening as are her numbers with respect elevation. She continues to wake up every 2 hours to have to urinate. She denies dysuria  Overactive bladder: She expresses frustration that Detrol LA worked no better than Mybetric. She's tried a variety of oral medications to help with her overactive bladder but still has to wake up every 2 hours to urinate, is not uncommon for her to have some mild incontinence if she can get to the bathroom in time.  She is requesting a refill on her Percocet, she denies any known side effects and states it's helping her live with her low midline back pain which is nonradiating and has not changed in character since I saw her last     Review Of Systems Outlined In HPI  Past Medical History  Diagnosis Date  . Chronic renal insufficiency 02/26/2014  . COPD (chronic obstructive pulmonary disease) ([place]) 02/26/2014  . Hypothyroid 02/26/2014  . Presence of stent in coronary artery in patient with coronary artery disease 02/26/2014  . Type 2 diabetes mellitus ([place]) 02/26/2014  . Asthma   . CAD (coronary artery disease)   . Renal artery stenosis ([place])   . Atrial flutter ([place])   . Cerebrovascular disease     Past Surgical History  Procedure Laterality Date  . Coronary artery bypass graft      2004, [place]  . Hip replacement    . Knee surgery Bilateral   . Back surgery    . Cholecystectomy    .  Tonsillectomy    . Appendectomy    . Abdominal hysterectomy    . Shoulder surgery    . Nasal sinus surgery     Family History  Problem Relation Age of Onset  . CAD Father   . Heart attack Father     Social History   Social History  . Marital Status: Married    Spouse Name: N/A  . Number of Children: 4  . Years of Education: N/A   Occupational History  . Not on file.   Social History Main Topics  . Smoking status: Never Smoker   . Smokeless tobacco: Not on file  . Alcohol Use: No  . Drug Use: No  . Sexual Activity: No   Other Topics Concern  . Not on file   Social History Narrative     Objective: BP 144/75 mmHg  Pulse 88  Wt 167 lb (75.751 kg)  General: Alert and Oriented, No Acute Distress HEENT: Pupils equal, round, reactive to light. Conjunctivae clear.  Moist nevus membranes Lungs: Clear to auscultation bilaterally, no wheezing/ronchi/rales.  Comfortable work of breathing. Good air movement. Cardiac: Regular rate and rhythm. Normal S1/S2.  No murmurs, rubs, nor gallops.   Extremities: No peripheral edema.  Strong peripheral pulses.  Mental Status: No depression, anxiety, nor agitation. Skin: Warm and dry.  Assessment & Plan: Briana Werner was seen today for hyperglycemia, fatigue  and hot flashes.  Diagnoses and all orders for this visit:  Type 2 diabetes mellitus with diabetic nephropathy, unspecified long term insulin use status (HCC) -     POCT HgB A1C -     Insulin Glargine (TOUJEO SOLOSTAR) 300 UNIT/ML SOPN; Inject 15 Units into the skin daily.  Other fatigue -     TSH -     COMPLETE METABOLIC PANEL WITH GFR -     Urinalysis, Routine w reflex microscopic -     Hemoglobin  Overactive bladder -     Ambulatory referral to Urology  Spondylisthesis -     oxyCODONE-acetaminophen (PERCOCET) 10-325 MG tablet; Take 0.5-1 tablets by mouth every 6 (six) hours as needed for pain.   Type 2 diabetes: A1c today is controlled however great variability with her  numbers at home especially while fasting. Will switch from Lantus to [facility] hopes of seeing more consistent numbers, then we can titrate as needed. Fatigue: Rule out metabolic abnormality or hypothyroidism. Rule out anemia or UTI. Overactive bladder: Discussed that one other patient in her situation that got benefit from Botox injections in the bladder, she tells me that she would pursue this if she is a candidate, referral has been placed.  Return in about 4 weeks (around 08/25/2015), or if symptoms worsen or fail to improve.

## 2015-07-29 LAB — COMPLETE METABOLIC PANEL WITH GFR
ALT: 14 U/L (ref 6–29)
AST: 34 U/L (ref 10–35)
Albumin: 4.4 g/dL (ref 3.6–5.1)
Alkaline Phosphatase: 81 U/L (ref 33–130)
BILIRUBIN TOTAL: 1.1 mg/dL (ref 0.2–1.2)
BUN: 30 mg/dL — ABNORMAL HIGH (ref 7–25)
CALCIUM: 9.7 mg/dL (ref 8.6–10.4)
CHLORIDE: 103 mmol/L (ref 98–110)
CO2: 25 mmol/L (ref 20–31)
CREATININE: 1.55 mg/dL — AB (ref 0.60–0.93)
GFR, EST AFRICAN AMERICAN: 37 mL/min — AB (ref >=60)
GFR, EST NON AFRICAN AMERICAN: 32 mL/min — AB (ref >=60)
Glucose, Bld: 206 mg/dL — ABNORMAL HIGH (ref 65–99)
Potassium: 4.8 mmol/L (ref 3.5–5.3)
Sodium: 139 mmol/L (ref 135–146)
Total Protein: 7.4 g/dL (ref 6.1–8.1)

## 2015-07-29 LAB — URINALYSIS, ROUTINE W REFLEX MICROSCOPIC
Bilirubin Urine: NEGATIVE
Glucose, UA: NEGATIVE
HGB URINE DIPSTICK: NEGATIVE
KETONES UR: NEGATIVE
NITRITE: NEGATIVE
PH: 6.5 (ref 5.0–8.0)
Specific Gravity, Urine: 1.017 (ref 1.001–1.035)

## 2015-07-29 LAB — URINALYSIS, MICROSCOPIC ONLY
Bacteria, UA: NONE SEEN [HPF]
Casts: NONE SEEN [LPF]
Crystals: NONE SEEN [HPF]
Squamous Epithelial / LPF: NONE SEEN [HPF] (ref <=5)
WBC UA: NONE SEEN WBC/HPF (ref <=5)
Yeast: NONE SEEN [HPF]

## 2015-07-29 LAB — HEMOGLOBIN: HEMOGLOBIN: 11.7 g/dL — AB (ref 12.0–15.0)

## 2015-07-29 LAB — TSH: TSH: 1.167 u[IU]/mL (ref 0.350–4.500)

## 2015-08-28 ENCOUNTER — Other Ambulatory Visit: Payer: Self-pay | Admitting: Family Medicine

## 2015-08-29 ENCOUNTER — Ambulatory Visit: Payer: Medicare Other

## 2015-08-29 ENCOUNTER — Other Ambulatory Visit: Payer: Self-pay | Admitting: Family Medicine

## 2015-09-01 ENCOUNTER — Other Ambulatory Visit: Payer: Self-pay | Admitting: Family Medicine

## 2015-09-10 DIAGNOSIS — N3946 Mixed incontinence: Secondary | ICD-10-CM | POA: Diagnosis not present

## 2015-09-10 DIAGNOSIS — N3020 Other chronic cystitis without hematuria: Secondary | ICD-10-CM | POA: Diagnosis not present

## 2015-09-10 DIAGNOSIS — R351 Nocturia: Secondary | ICD-10-CM | POA: Diagnosis not present

## 2015-09-24 ENCOUNTER — Other Ambulatory Visit: Payer: Self-pay

## 2015-09-24 DIAGNOSIS — M4310 Spondylolisthesis, site unspecified: Secondary | ICD-10-CM

## 2015-09-24 MED ORDER — OXYCODONE-ACETAMINOPHEN 10-325 MG PO TABS: 0.5000 | ORAL_TABLET | Freq: Four times a day (QID) | ORAL | Status: DC | PRN

## 2015-10-05 ENCOUNTER — Other Ambulatory Visit: Payer: Self-pay | Admitting: Family Medicine

## 2015-10-06 ENCOUNTER — Other Ambulatory Visit: Payer: Self-pay | Admitting: Family Medicine

## 2015-10-07 ENCOUNTER — Other Ambulatory Visit: Payer: Self-pay

## 2015-10-07 ENCOUNTER — Telehealth: Payer: Self-pay

## 2015-10-07 NOTE — Telephone Encounter (Signed)
I have never prescribed these prescriptions for this patient.

## 2015-10-07 NOTE — Telephone Encounter (Signed)
Pharmacy notified.

## 2015-10-07 NOTE — Telephone Encounter (Signed)
Pt pharmacy is requesting refills for amlodipine and oxybutynin.  Neither of these medications are on her medication list.  Is this request appropriate?

## 2015-10-15 ENCOUNTER — Encounter: Payer: Self-pay | Admitting: Family Medicine

## 2015-10-15 ENCOUNTER — Ambulatory Visit (INDEPENDENT_AMBULATORY_CARE_PROVIDER_SITE_OTHER): Payer: Medicare Other | Admitting: Family Medicine

## 2015-10-15 VITALS — BP 171/71 | HR 66 | Wt 165.0 lb

## 2015-10-15 DIAGNOSIS — R05 Cough: Secondary | ICD-10-CM

## 2015-10-15 DIAGNOSIS — E1121 Type 2 diabetes mellitus with diabetic nephropathy: Secondary | ICD-10-CM | POA: Diagnosis not present

## 2015-10-15 DIAGNOSIS — Z794 Long term (current) use of insulin: Secondary | ICD-10-CM | POA: Diagnosis not present

## 2015-10-15 DIAGNOSIS — I10 Essential (primary) hypertension: Secondary | ICD-10-CM

## 2015-10-15 DIAGNOSIS — R059 Cough, unspecified: Secondary | ICD-10-CM

## 2015-10-15 MED ORDER — LISINOPRIL 10 MG PO TABS: 15.0000 mg | ORAL_TABLET | Freq: Every day | ORAL | Status: DC

## 2015-10-15 MED ORDER — AZITHROMYCIN 250 MG PO TABS: ORAL_TABLET | ORAL | Status: AC

## 2015-10-15 MED ORDER — INSULIN GLARGINE 300 UNIT/ML ~~LOC~~ SOPN: 23.0000 [IU] | PEN_INJECTOR | Freq: Every day | SUBCUTANEOUS | Status: DC

## 2015-10-15 NOTE — Progress Notes (Signed)
CC: Briana Werner is a 80 y.o. female is here for Hypertension; Hyperglycemia; and URI   Subjective: HPI:   follow-up essential hypertension: Taking carvedilol and lisinopril daily. No outside blood pressures report. No chest pain shortness of breath orthopnea nor peripheral edema.  Follow-up type 2 diabetes: She is seeing fasting blood sugars ranging anywhere from 150-330. Nothing changed in her diet. No physical activity other than lifting her husband  When helping with transfers. No hypoglycemic episodes. No difficulty with the switch Toujeo to a few months ago. Continues to [place] as well with 100% compliance.  She's been experiencing a dry cough for the past 3 weeks on a daily basis. Nothing seems to make it better or worse. No interventions as of yet. She notices that it's present only when she has postnasal drip. She denies any wheezing or shortness of breath. No chest pain   Review Of Systems Outlined In HPI  Past Medical History  Diagnosis Date  . Chronic renal insufficiency 02/26/2014  . COPD (chronic obstructive pulmonary disease) ([place]) 02/26/2014  . Hypothyroid 02/26/2014  . Presence of stent in coronary artery in patient with coronary artery disease 02/26/2014  . Type 2 diabetes mellitus ([place]) 02/26/2014  . Asthma   . CAD (coronary artery disease)   . Renal artery stenosis ([place])   . Atrial flutter ([place])   . Cerebrovascular disease     Past Surgical History  Procedure Laterality Date  . Coronary artery bypass graft      2004, [place]  . Hip replacement    . Knee surgery Bilateral   . Back surgery    . Cholecystectomy    . Tonsillectomy    . Appendectomy    . Abdominal hysterectomy    . Shoulder surgery    . Nasal sinus surgery     Family History  Problem Relation Age of Onset  . CAD Father   . Heart attack Father     Social History   Social History  . Marital Status: Married    Spouse Name: N/A  . Number of Children: 4  . Years of Education: N/A    Occupational History  . Not on file.   Social History Main Topics  . Smoking status: Never Smoker   . Smokeless tobacco: Not on file  . Alcohol Use: No  . Drug Use: No  . Sexual Activity: No   Other Topics Concern  . Not on file   Social History Narrative     Objective: BP 171/71 mmHg  Pulse 66  Wt 165 lb (74.844 kg)  General: Alert and Oriented, No Acute Distress HEENT: Pupils equal, round, reactive to light. Conjunctivae clear.  External ears unremarkable, canals clear with intact TMs with appropriate landmarks.  Middle ear appears open without effusion. Pink inferior turbinates.  Moist mucous membranes, pharynx without inflammation nor lesions.  Neck supple without palpable lymphadenopathy nor abnormal masses. Lungs: Clear to auscultation bilaterally, no wheezing/ronchi/rales.  Comfortable work of breathing. Good air movement. Cardiac: irregularly irregular rhythm less than 100 bpm  Extremities: No peripheral edema.  Strong peripheral pulses.  Mental Status: No depression, anxiety, nor agitation. Skin: Warm and dry.  Assessment & Plan: Briana Werner was seen today for hypertension, hyperglycemia and uri.  Diagnoses and all orders for this visit:  Essential hypertension, benign  Type 2 diabetes mellitus with diabetic nephropathy, with long-term current use of insulin ([place])  Type 2 diabetes mellitus with diabetic nephropathy, unspecified long term insulin use status ([place]) -  Insulin Glargine (TOUJEO SOLOSTAR) 300 UNIT/ML SOPN; Inject 23 Units into the skin daily.  Cough -     azithromycin (ZITHROMAX) 250 MG tablet; Take two tabs at once on day 1, then one tab daily on days 2-5.  Other orders -     lisinopril (PRINIVIL,ZESTRIL) 10 MG tablet; Take 1.5 tablets (15 mg total) by mouth daily.   Essential hypertension: Uncontrolled chronic condition increasing lisinopril continue carvedilol Type 2 diabetes: A1c is 7.5, uncontrolled increasingToujeo Cough: Start  azithromycin for suspected sinus infection causing postnasal drip.  Return in about 3 months (around 01/12/2016).

## 2015-10-16 DIAGNOSIS — R351 Nocturia: Secondary | ICD-10-CM | POA: Diagnosis not present

## 2015-10-16 DIAGNOSIS — N3946 Mixed incontinence: Secondary | ICD-10-CM | POA: Diagnosis not present

## 2015-10-16 DIAGNOSIS — Z0000 Encounter for general adult medical examination without abnormal findings: Secondary | ICD-10-CM | POA: Diagnosis not present

## 2015-10-16 LAB — POCT GLYCOSYLATED HEMOGLOBIN (HGB A1C): HEMOGLOBIN A1C: 7.5

## 2015-10-16 NOTE — Addendum Note (Signed)
Addended by: Delrae Alfred on: 10/16/2015 09:16 AM   Modules accepted: Orders

## 2015-11-03 ENCOUNTER — Other Ambulatory Visit: Payer: Self-pay | Admitting: Family Medicine

## 2015-11-04 ENCOUNTER — Other Ambulatory Visit: Payer: Self-pay | Admitting: Family Medicine

## 2015-11-05 ENCOUNTER — Other Ambulatory Visit: Payer: Self-pay | Admitting: Family Medicine

## 2015-11-06 ENCOUNTER — Other Ambulatory Visit: Payer: Self-pay

## 2015-11-06 MED ORDER — INTEGRA PLUS PO CAPS: 1.0000 | ORAL_CAPSULE | Freq: Every day | ORAL | Status: DC

## 2015-11-06 MED ORDER — SITAGLIPTIN PHOSPHATE 50 MG PO TABS: 50.0000 mg | ORAL_TABLET | Freq: Every day | ORAL | Status: DC

## 2015-11-06 MED ORDER — ESCITALOPRAM OXALATE 5 MG PO TABS: 5.0000 mg | ORAL_TABLET | Freq: Every day | ORAL | Status: DC

## 2015-11-12 DIAGNOSIS — N3946 Mixed incontinence: Secondary | ICD-10-CM | POA: Diagnosis not present

## 2015-11-14 ENCOUNTER — Encounter: Payer: Self-pay | Admitting: Family Medicine

## 2015-11-14 ENCOUNTER — Ambulatory Visit (INDEPENDENT_AMBULATORY_CARE_PROVIDER_SITE_OTHER): Payer: Medicare Other | Admitting: Family Medicine

## 2015-11-14 VITALS — BP 117/72 | HR 127 | Wt 164.0 lb

## 2015-11-14 DIAGNOSIS — J329 Chronic sinusitis, unspecified: Secondary | ICD-10-CM

## 2015-11-14 DIAGNOSIS — A499 Bacterial infection, unspecified: Secondary | ICD-10-CM | POA: Diagnosis not present

## 2015-11-14 DIAGNOSIS — B9689 Other specified bacterial agents as the cause of diseases classified elsewhere: Secondary | ICD-10-CM

## 2015-11-14 DIAGNOSIS — M4310 Spondylolisthesis, site unspecified: Secondary | ICD-10-CM | POA: Diagnosis not present

## 2015-11-14 MED ORDER — PREDNISONE 20 MG PO TABS: ORAL_TABLET | ORAL | Status: AC

## 2015-11-14 MED ORDER — RIVASTIGMINE TARTRATE 1.5 MG PO CAPS: 1.5000 mg | ORAL_CAPSULE | Freq: Two times a day (BID) | ORAL | Status: DC

## 2015-11-14 MED ORDER — OXYCODONE-ACETAMINOPHEN 10-325 MG PO TABS: 0.5000 | ORAL_TABLET | Freq: Four times a day (QID) | ORAL | Status: DC | PRN

## 2015-11-14 MED ORDER — CEFDINIR 300 MG PO CAPS
300.0000 mg | ORAL_CAPSULE | Freq: Two times a day (BID) | ORAL | Status: AC
Start: 2015-11-14 — End: 2015-11-24

## 2015-11-14 NOTE — Progress Notes (Signed)
CC: Briana Werner is a 80 y.o. female is here for URI   Subjective: HPI:  Continued cough, nonproductive cough, sore throat, nasal congestion and facial pain and the cheeks. Symptoms improved for 2 days while taking azithromycin however never fully went away. She feels fatigued but has no other complaints today. She denies fevers, chills, wheezing, shortness of breath or chest pain  She would like a refill on her oxycodone that she is currently using for spondylolisthesis and chronic low back pain. She denies any falls    nor does the pain influence her quality of life while she takes this medication daily. Review Of Systems Outlined In HPI  Past Medical History  Diagnosis Date  . Chronic renal insufficiency 02/26/2014  . COPD (chronic obstructive pulmonary disease) ([place]) 02/26/2014  . Hypothyroid 02/26/2014  . Presence of stent in coronary artery in patient with coronary artery disease 02/26/2014  . Type 2 diabetes mellitus ([place]) 02/26/2014  . Asthma   . CAD (coronary artery disease)   . Renal artery stenosis ([place])   . Atrial flutter ([place])   . Cerebrovascular disease     Past Surgical History  Procedure Laterality Date  . Coronary artery bypass graft      2004, [place]  . Hip replacement    . Knee surgery Bilateral   . Back surgery    . Cholecystectomy    . Tonsillectomy    . Appendectomy    . Abdominal hysterectomy    . Shoulder surgery    . Nasal sinus surgery     Family History  Problem Relation Age of Onset  . CAD Father   . Heart attack Father     Social History   Social History  . Marital Status: Married    Spouse Name: N/A  . Number of Children: 4  . Years of Education: N/A   Occupational History  . Not on file.   Social History Main Topics  . Smoking status: Never Smoker   . Smokeless tobacco: Not on file  . Alcohol Use: No  . Drug Use: No  . Sexual Activity: No   Other Topics Concern  . Not on file   Social History Narrative     Objective: BP  117/72 mmHg  Pulse 127  Wt 164 lb (74.39 kg)  SpO2 93%  General: Alert and Oriented, No Acute Distress HEENT: Pupils equal, round, reactive to light. Conjunctivae clear.  External ears unremarkable, canals clear with intact TMs with appropriate landmarks.  Middle ear appears open without effusion. Pink inferior turbinates.  Moist mucous membranes, pharynx without inflammation nor lesions other than postnasal drip.  Neck supple without palpable lymphadenopathy nor abnormal masses. Lungs: Clear to auscultation bilaterally, no wheezing/ronchi/rales.  Comfortable work of breathing. Good air movement. Extremities: No peripheral edema.  Strong peripheral pulses.  Mental Status: No depression, anxiety, nor agitation. Skin: Warm and dry.  Assessment & Plan: Briana Werner was seen today for uri.  Diagnoses and all orders for this visit:  Bacterial sinusitis -     cefdinir (OMNICEF) 300 MG capsule; Take 1 capsule (300 mg total) by mouth 2 (two) times daily. -     predniSONE (DELTASONE) 20 MG tablet; Three tabs at once daily for five days.  Spondylisthesis -     oxyCODONE-acetaminophen (PERCOCET) 10-325 MG tablet; Take 0.5-1 tablets by mouth every 6 (six) hours as needed for pain.  Other orders -     rivastigmine (EXELON) 1.5 MG capsule; Take 1 capsule (1.5 mg total)  by mouth 2 (two) times daily.   Bacterial sinusitis: Start prednisone burst and Omnicef.    25 minutes spent face-to-face during visit today of which at least 50% was counseling or coordinating care regarding: 1. Bacterial sinusitis   2. Spondylisthesis      No Follow-up on file.

## 2015-11-26 ENCOUNTER — Other Ambulatory Visit: Payer: Self-pay | Admitting: Family Medicine

## 2015-12-01 ENCOUNTER — Other Ambulatory Visit: Payer: Self-pay | Admitting: Family Medicine

## 2015-12-02 ENCOUNTER — Other Ambulatory Visit: Payer: Self-pay | Admitting: Family Medicine

## 2015-12-31 ENCOUNTER — Other Ambulatory Visit: Payer: Self-pay | Admitting: Family Medicine

## 2016-01-01 ENCOUNTER — Other Ambulatory Visit: Payer: Self-pay | Admitting: Family Medicine

## 2016-01-01 ENCOUNTER — Ambulatory Visit (INDEPENDENT_AMBULATORY_CARE_PROVIDER_SITE_OTHER): Payer: Medicare Other

## 2016-01-01 ENCOUNTER — Ambulatory Visit (INDEPENDENT_AMBULATORY_CARE_PROVIDER_SITE_OTHER): Payer: Medicare Other | Admitting: Family Medicine

## 2016-01-01 VITALS — BP 197/73 | HR 93 | Wt 170.0 lb

## 2016-01-01 DIAGNOSIS — M545 Low back pain: Secondary | ICD-10-CM | POA: Diagnosis not present

## 2016-01-01 DIAGNOSIS — M4310 Spondylolisthesis, site unspecified: Secondary | ICD-10-CM | POA: Diagnosis not present

## 2016-01-01 DIAGNOSIS — R102 Pelvic and perineal pain: Secondary | ICD-10-CM

## 2016-01-01 DIAGNOSIS — M5442 Lumbago with sciatica, left side: Secondary | ICD-10-CM | POA: Diagnosis not present

## 2016-01-01 DIAGNOSIS — M546 Pain in thoracic spine: Secondary | ICD-10-CM | POA: Diagnosis not present

## 2016-01-01 DIAGNOSIS — M50323 Other cervical disc degeneration at C6-C7 level: Secondary | ICD-10-CM | POA: Diagnosis not present

## 2016-01-01 MED ORDER — OXYCODONE-ACETAMINOPHEN 10-325 MG PO TABS: 0.5000 | ORAL_TABLET | Freq: Two times a day (BID) | ORAL | Status: DC | PRN

## 2016-01-01 NOTE — Patient Instructions (Signed)
Thank you for coming in today. Attend PT.  Use oxycodone for pain as needed.  Follow up in 2-4 weeks.  Come back or go to the emergency room if you notice new weakness new numbness problems walking or bowel or bladder problems.

## 2016-01-01 NOTE — Progress Notes (Signed)
Briana Werner is a 80 y.o. female who presents to [facility] today for back pain. Patient notes severe back pain starting a few weeks ago. She laid down in her bed and felt a pop in her back. She notes high lumbar/lower thoracic midline back pain as well as left-sided low back pain and pain radiating down her thigh and into her lower leg. She has a history of multiple fractures including pubic rami fracture, hip fracture and multiple knee replacement surgeries. She has been told that she has osteoporosis in the past. She denies any new weakness or numbness bowel bladder dysfunction. She takes oxycodone for pain which works reasonably well.   Past Medical History  Diagnosis Date  . Chronic renal insufficiency 02/26/2014  . COPD (chronic obstructive pulmonary disease) ([place]) 02/26/2014  . Hypothyroid 02/26/2014  . Presence of stent in coronary artery in patient with coronary artery disease 02/26/2014  . Type 2 diabetes mellitus ([place]) 02/26/2014  . Asthma   . CAD (coronary artery disease)   . Renal artery stenosis ([place])   . Atrial flutter ([place])   . Cerebrovascular disease    Past Surgical History  Procedure Laterality Date  . Coronary artery bypass graft      2004, [place]  . Hip replacement    . Knee surgery Bilateral   . Back surgery    . Cholecystectomy    . Tonsillectomy    . Appendectomy    . Abdominal hysterectomy    . Shoulder surgery    . Nasal sinus surgery     Social History  Substance Use Topics  . Smoking status: Never Smoker   . Smokeless tobacco: Not on file  . Alcohol Use: No   family history includes CAD in her father; Heart attack in her father.  ROS:  No headache, visual changes, nausea, vomiting, diarrhea, constipation, dizziness, abdominal pain, skin rash, fevers, chills, night sweats, weight loss, swollen lymph nodes, body aches, joint swelling, muscle aches, chest pain, shortness of breath, mood changes, visual or  auditory hallucinations.    Medications: Current Outpatient Prescriptions  Medication Sig Dispense Refill  . ADVAIR DISKUS 500-50 MCG/DOSE AEPB INHALE 1 PUFF INTO THE LUNGS TWICE DAILY 60 each 0  . ALPRAZolam (XANAX) 0.25 MG tablet Take 1 tablet (0.25 mg total) by mouth at bedtime as needed for anxiety. 20 tablet 0  . AMBULATORY NON FORMULARY MEDICATION Diabetic Test Strips and Lancets: One Touch Ultra.   Dx Type 2 Diabetes.e11.21  Use to check blood sugar twice a day. 100 Units 3  . AMBULATORY NON FORMULARY MEDICATION Insulin needles for lantus solastar  31g x4 100 each 11  . apixaban (ELIQUIS) 5 MG TABS tablet Take 1 tablet (5 mg total) by mouth 2 (two) times daily. 60 tablet 2  . atorvastatin (LIPITOR) 40 MG tablet TAKE 1 TABLET(40 MG) BY MOUTH DAILY AT 6 PM 30 tablet 0  . Calcium Carbonate-Vitamin D (CALTRATE 600+D PO) Take by mouth 2 (two) times daily.    . carvedilol (COREG) 6.25 MG tablet TAKE 1 TABLET BY MOUTH TWICE DAILY WITH MEALS 60 tablet 0  . escitalopram (LEXAPRO) 5 MG tablet Take 1 tablet (5 mg total) by mouth daily. 90 tablet 0  . FeFum-FePoly-FA-B Cmp-C-Biot (INTEGRA PLUS) CAPS Take 1 capsule by mouth daily. 30 capsule 0  . fesoterodine (TOVIAZ) 8 MG TB24 tablet Take 1 tablet (8 mg total) by mouth daily. 30 tablet   . Insulin Glargine (TOUJEO SOLOSTAR) 300 UNIT/ML  SOPN Inject 23 Units into the skin daily. 4.5 mL 2  . IRON PO Take by mouth.    . levothyroxine (SYNTHROID, LEVOTHROID) 88 MCG tablet TAKE 1 TABLET BY MOUTH DAILY BEFORE BREAKFAST 90 tablet 0  . lisinopril (PRINIVIL,ZESTRIL) 10 MG tablet TAKE 1 TABLET BY MOUTH ONCE DAILY 30 tablet 2  . montelukast (SINGULAIR) 10 MG tablet TAKE 1 TABLET BY MOUTH AT BEDTIME 30 tablet 0  . Multiple Vitamin (MULTIVITAMIN) capsule Take 1 capsule by mouth.    Marland Kitchen MYRBETRIQ 50 MG TB24 tablet TAKE 1 TABLET(50 MG) BY MOUTH DAILY 30 tablet 0  . ONE TOUCH ULTRA TEST test strip CHECK BLOOD GLUCOSE TWICE DAILY 100 each 1  .  oxyCODONE-acetaminophen (PERCOCET) 10-325 MG tablet Take 0.5-1 tablets by mouth every 6 (six) hours as needed for pain. 90 tablet 0  . pantoprazole (PROTONIX) 40 MG tablet TAKE 1 TABLET BY MOUTH DAILY 30 tablet 0  . PENNSAID 2 % SOLN APPLY NICKEL SIZED DOLLOP TO EACH KNEE AND RUB IN UP TO FOUR TIMES DAILY 112 g 0  . rivastigmine (EXELON) 1.5 MG capsule Take 1 capsule (1.5 mg total) by mouth 2 (two) times daily. 180 capsule 3  . sitaGLIPtin (JANUVIA) 50 MG tablet Take 1 tablet (50 mg total) by mouth daily. 90 tablet 0  . tolterodine (DETROL LA) 2 MG 24 hr capsule TAKE 1 CAPSULE(2 MG) BY MOUTH DAILY 30 capsule 0  . albuterol (PROVENTIL HFA;VENTOLIN HFA) 108 (90 BASE) MCG/ACT inhaler Inhale two puffs every 4-6 hours only as needed for shortness of breath or wheezing. 1 Inhaler 1   No current facility-administered medications for this visit.   Allergies  Allergen Reactions  . Tetracycline Shortness Of Breath  . Tetracyclines & Related Anaphylaxis and Shortness Of Breath    dizziness  . Amlodipine     Edema  . Amoxicillin     Questionable rash  . Bactrim [Sulfamethoxazole-Trimethoprim]     dizzy  . Latex Dermatitis    Bandages, pulls skin  . Tape Dermatitis    Bandaids      Exam:  BP 197/73 mmHg  Pulse 93  Wt 170 lb (77.111 kg) General: Well Developed, well nourished, and in no acute distress.  Neuro/Psych: Alert and oriented x3, extra-ocular muscles intact, able to move all 4 extremities, sensation grossly intact. Skin: Warm and dry, no rashes noted.  Respiratory: Not using accessory muscles, speaking in full sentences, trachea midline.  Cardiovascular: Pulses palpable, no extremity edema. Abdomen: Does not appear distended. MSK: Spine: Tender to palpation high lumbar low thoracic spine. Nontender otherwise. Tender to palpation left SI joint. Hip nontender knee nontender. Normal hip and knee motion but some pain with motion. Antalgic gait using a walker.  X-ray: T,L spine and  pelvis pending.    No results found for this or any previous visit (from the past 24 hour(s)). No results found.    80 yo woman with worsening chronic back and pelvis pain. Likely MSK strain however compression fx is a possibility. Plan for refer to PT and xray T,L  Spine and pelvis.  Treat pain with limited oxycodone. F/u in 2-4 weeks.    CC: Marcial Pacas, DO

## 2016-01-02 ENCOUNTER — Telehealth: Payer: Self-pay | Admitting: Family Medicine

## 2016-01-02 ENCOUNTER — Telehealth: Payer: Self-pay

## 2016-01-02 NOTE — Telephone Encounter (Signed)
Updated recommendations provided to pt advising to return to clinic. FU appt scheduled.

## 2016-01-02 NOTE — Telephone Encounter (Signed)
To clarify.  Pt should return to clinic if her pain is dramatically worse.  Will order MRI then if needed.

## 2016-01-02 NOTE — Telephone Encounter (Signed)
Pt notified. appt scheduled for 01/05/2016.

## 2016-01-02 NOTE — Progress Notes (Signed)
Quick Note:  No acute fractures seen in back or pelvis ______

## 2016-01-02 NOTE — Telephone Encounter (Signed)
She should have MRI on Saturday and Return to clinic Monday. Go to ER if worsening.

## 2016-01-04 DIAGNOSIS — M5416 Radiculopathy, lumbar region: Secondary | ICD-10-CM | POA: Diagnosis not present

## 2016-01-04 DIAGNOSIS — I10 Essential (primary) hypertension: Secondary | ICD-10-CM | POA: Diagnosis not present

## 2016-01-04 DIAGNOSIS — I482 Chronic atrial fibrillation: Secondary | ICD-10-CM | POA: Diagnosis not present

## 2016-01-04 DIAGNOSIS — M4806 Spinal stenosis, lumbar region: Secondary | ICD-10-CM | POA: Diagnosis not present

## 2016-01-04 DIAGNOSIS — N179 Acute kidney failure, unspecified: Secondary | ICD-10-CM | POA: Diagnosis not present

## 2016-01-04 DIAGNOSIS — M549 Dorsalgia, unspecified: Secondary | ICD-10-CM | POA: Diagnosis not present

## 2016-01-04 DIAGNOSIS — B0189 Other varicella complications: Secondary | ICD-10-CM | POA: Diagnosis not present

## 2016-01-04 DIAGNOSIS — B027 Disseminated zoster: Secondary | ICD-10-CM | POA: Diagnosis not present

## 2016-01-04 DIAGNOSIS — M5440 Lumbago with sciatica, unspecified side: Secondary | ICD-10-CM | POA: Diagnosis not present

## 2016-01-04 DIAGNOSIS — M4724 Other spondylosis with radiculopathy, thoracic region: Secondary | ICD-10-CM | POA: Diagnosis not present

## 2016-01-04 DIAGNOSIS — B028 Zoster with other complications: Secondary | ICD-10-CM | POA: Diagnosis not present

## 2016-01-04 DIAGNOSIS — J449 Chronic obstructive pulmonary disease, unspecified: Secondary | ICD-10-CM | POA: Diagnosis not present

## 2016-01-05 ENCOUNTER — Ambulatory Visit: Payer: Medicare Other | Admitting: Family Medicine

## 2016-01-05 DIAGNOSIS — B028 Zoster with other complications: Secondary | ICD-10-CM | POA: Diagnosis not present

## 2016-01-05 DIAGNOSIS — M5417 Radiculopathy, lumbosacral region: Secondary | ICD-10-CM | POA: Diagnosis not present

## 2016-01-06 DIAGNOSIS — M4806 Spinal stenosis, lumbar region: Secondary | ICD-10-CM | POA: Diagnosis present

## 2016-01-06 DIAGNOSIS — K219 Gastro-esophageal reflux disease without esophagitis: Secondary | ICD-10-CM | POA: Diagnosis present

## 2016-01-06 DIAGNOSIS — I10 Essential (primary) hypertension: Secondary | ICD-10-CM | POA: Diagnosis not present

## 2016-01-06 DIAGNOSIS — Z79890 Hormone replacement therapy (postmenopausal): Secondary | ICD-10-CM | POA: Diagnosis not present

## 2016-01-06 DIAGNOSIS — N39498 Other specified urinary incontinence: Secondary | ICD-10-CM | POA: Diagnosis present

## 2016-01-06 DIAGNOSIS — B027 Disseminated zoster: Secondary | ICD-10-CM | POA: Diagnosis present

## 2016-01-06 DIAGNOSIS — E119 Type 2 diabetes mellitus without complications: Secondary | ICD-10-CM | POA: Diagnosis present

## 2016-01-06 DIAGNOSIS — M5116 Intervertebral disc disorders with radiculopathy, lumbar region: Secondary | ICD-10-CM | POA: Diagnosis present

## 2016-01-06 DIAGNOSIS — E785 Hyperlipidemia, unspecified: Secondary | ICD-10-CM | POA: Diagnosis present

## 2016-01-06 DIAGNOSIS — B0189 Other varicella complications: Secondary | ICD-10-CM | POA: Diagnosis present

## 2016-01-06 DIAGNOSIS — B379 Candidiasis, unspecified: Secondary | ICD-10-CM | POA: Diagnosis present

## 2016-01-06 DIAGNOSIS — M5417 Radiculopathy, lumbosacral region: Secondary | ICD-10-CM | POA: Diagnosis not present

## 2016-01-06 DIAGNOSIS — J45909 Unspecified asthma, uncomplicated: Secondary | ICD-10-CM | POA: Diagnosis present

## 2016-01-06 DIAGNOSIS — M5136 Other intervertebral disc degeneration, lumbar region: Secondary | ICD-10-CM | POA: Diagnosis not present

## 2016-01-06 DIAGNOSIS — Z955 Presence of coronary angioplasty implant and graft: Secondary | ICD-10-CM | POA: Diagnosis not present

## 2016-01-06 DIAGNOSIS — B029 Zoster without complications: Secondary | ICD-10-CM | POA: Diagnosis not present

## 2016-01-06 DIAGNOSIS — I4891 Unspecified atrial fibrillation: Secondary | ICD-10-CM | POA: Diagnosis not present

## 2016-01-06 DIAGNOSIS — Z881 Allergy status to other antibiotic agents status: Secondary | ICD-10-CM | POA: Diagnosis not present

## 2016-01-06 DIAGNOSIS — B028 Zoster with other complications: Secondary | ICD-10-CM | POA: Diagnosis present

## 2016-01-06 DIAGNOSIS — J449 Chronic obstructive pulmonary disease, unspecified: Secondary | ICD-10-CM | POA: Diagnosis not present

## 2016-01-06 DIAGNOSIS — I482 Chronic atrial fibrillation: Secondary | ICD-10-CM | POA: Diagnosis present

## 2016-01-06 DIAGNOSIS — G8929 Other chronic pain: Secondary | ICD-10-CM | POA: Diagnosis present

## 2016-01-06 DIAGNOSIS — Z7901 Long term (current) use of anticoagulants: Secondary | ICD-10-CM | POA: Diagnosis not present

## 2016-01-06 DIAGNOSIS — M549 Dorsalgia, unspecified: Secondary | ICD-10-CM | POA: Diagnosis not present

## 2016-01-06 DIAGNOSIS — Z905 Acquired absence of kidney: Secondary | ICD-10-CM | POA: Diagnosis not present

## 2016-01-06 DIAGNOSIS — Z91040 Latex allergy status: Secondary | ICD-10-CM | POA: Diagnosis not present

## 2016-01-06 DIAGNOSIS — E039 Hypothyroidism, unspecified: Secondary | ICD-10-CM | POA: Diagnosis not present

## 2016-01-06 DIAGNOSIS — M5137 Other intervertebral disc degeneration, lumbosacral region: Secondary | ICD-10-CM | POA: Diagnosis not present

## 2016-01-06 DIAGNOSIS — I2510 Atherosclerotic heart disease of native coronary artery without angina pectoris: Secondary | ICD-10-CM | POA: Diagnosis not present

## 2016-01-06 DIAGNOSIS — R350 Frequency of micturition: Secondary | ICD-10-CM | POA: Diagnosis present

## 2016-01-08 ENCOUNTER — Other Ambulatory Visit: Payer: Self-pay | Admitting: Family Medicine

## 2016-01-09 DIAGNOSIS — J449 Chronic obstructive pulmonary disease, unspecified: Secondary | ICD-10-CM | POA: Diagnosis not present

## 2016-01-09 DIAGNOSIS — E119 Type 2 diabetes mellitus without complications: Secondary | ICD-10-CM | POA: Diagnosis not present

## 2016-01-09 DIAGNOSIS — B028 Zoster with other complications: Secondary | ICD-10-CM | POA: Diagnosis not present

## 2016-01-09 DIAGNOSIS — I4891 Unspecified atrial fibrillation: Secondary | ICD-10-CM | POA: Diagnosis not present

## 2016-01-09 DIAGNOSIS — J45909 Unspecified asthma, uncomplicated: Secondary | ICD-10-CM | POA: Diagnosis not present

## 2016-01-09 DIAGNOSIS — M5440 Lumbago with sciatica, unspecified side: Secondary | ICD-10-CM | POA: Diagnosis not present

## 2016-01-09 DIAGNOSIS — Z794 Long term (current) use of insulin: Secondary | ICD-10-CM | POA: Diagnosis not present

## 2016-01-09 DIAGNOSIS — M5417 Radiculopathy, lumbosacral region: Secondary | ICD-10-CM | POA: Diagnosis not present

## 2016-01-12 ENCOUNTER — Encounter: Payer: Self-pay | Admitting: Family Medicine

## 2016-01-12 DIAGNOSIS — B029 Zoster without complications: Secondary | ICD-10-CM | POA: Insufficient documentation

## 2016-01-12 DIAGNOSIS — I700 Atherosclerosis of aorta: Secondary | ICD-10-CM | POA: Insufficient documentation

## 2016-01-13 DIAGNOSIS — J449 Chronic obstructive pulmonary disease, unspecified: Secondary | ICD-10-CM | POA: Diagnosis not present

## 2016-01-13 DIAGNOSIS — E119 Type 2 diabetes mellitus without complications: Secondary | ICD-10-CM | POA: Diagnosis not present

## 2016-01-13 DIAGNOSIS — M5417 Radiculopathy, lumbosacral region: Secondary | ICD-10-CM | POA: Diagnosis not present

## 2016-01-13 DIAGNOSIS — M5440 Lumbago with sciatica, unspecified side: Secondary | ICD-10-CM | POA: Diagnosis not present

## 2016-01-13 DIAGNOSIS — B028 Zoster with other complications: Secondary | ICD-10-CM | POA: Diagnosis not present

## 2016-01-13 DIAGNOSIS — J45909 Unspecified asthma, uncomplicated: Secondary | ICD-10-CM | POA: Diagnosis not present

## 2016-01-15 ENCOUNTER — Encounter: Payer: Self-pay | Admitting: Family Medicine

## 2016-01-15 ENCOUNTER — Ambulatory Visit (INDEPENDENT_AMBULATORY_CARE_PROVIDER_SITE_OTHER): Payer: Medicare Other | Admitting: Family Medicine

## 2016-01-15 VITALS — BP 107/61 | HR 103 | Wt 169.0 lb

## 2016-01-15 DIAGNOSIS — M4310 Spondylolisthesis, site unspecified: Secondary | ICD-10-CM

## 2016-01-15 DIAGNOSIS — Z794 Long term (current) use of insulin: Secondary | ICD-10-CM | POA: Diagnosis not present

## 2016-01-15 DIAGNOSIS — M5416 Radiculopathy, lumbar region: Secondary | ICD-10-CM | POA: Diagnosis not present

## 2016-01-15 DIAGNOSIS — E1121 Type 2 diabetes mellitus with diabetic nephropathy: Secondary | ICD-10-CM | POA: Diagnosis not present

## 2016-01-15 MED ORDER — OXYCODONE-ACETAMINOPHEN 10-325 MG PO TABS: 0.5000 | ORAL_TABLET | Freq: Two times a day (BID) | ORAL | Status: DC | PRN

## 2016-01-15 NOTE — Assessment & Plan Note (Signed)
Based on MRI results and pain I suspect she has L3 or L4 left-sided lumbar radiculopathy. Plan for lumbar epidural steroid injection. Follow-up in a few weeks. Refill oxycodone.

## 2016-01-15 NOTE — Patient Instructions (Signed)
Thank you for coming in today. Expect to hear from [facility] about the injection soon.  Continue oxycodone.  Increase insulin to 18 units daily.  Follow up with me in 2 weeks.  Follow up with Dr Lemmie Evens soon. \ Call or go to the ER if you develop a large red swollen joint with extreme pain or oozing puss.

## 2016-01-15 NOTE — Assessment & Plan Note (Signed)
Increase glargine insulin to 18 units daily. Follow-up with PCP.

## 2016-01-15 NOTE — Progress Notes (Signed)
Briana Werner is a 80 y.o. female who presents to [place]: [facility] today for follow-up back pain and radiculopathy. Patient was seen a few weeks ago for back pain with radiating leg pain. In the interim she was seen in the emergency department at [facility] where a lumbar MRI was performed showing normal foraminal stenosis at the left L3-L4 and left L4-L5 among diffuse DDD. She does have for pain is located in her back and half of it is radiating to her left leg especially down the medial mid calf and into the lateral calf. She denies any weakness or numbness. She takes oxycodone for pain which helps.  Usually she has some confusion regarding her insulin. She currently is taking 15 units of glargine insulin daily. She notes her blood sugars are typically in the 200s to 250s. She denies any hypoglycemic episodes.   Past Medical History  Diagnosis Date  . Chronic renal insufficiency 02/26/2014  . COPD (chronic obstructive pulmonary disease) ([place]) 02/26/2014  . Hypothyroid 02/26/2014  . Presence of stent in coronary artery in patient with coronary artery disease 02/26/2014  . Type 2 diabetes mellitus ([place]) 02/26/2014  . Asthma   . CAD (coronary artery disease)   . Renal artery stenosis ([place])   . Atrial flutter ([place])   . Cerebrovascular disease    Past Surgical History  Procedure Laterality Date  . Coronary artery bypass graft      2004, [place]  . Hip replacement    . Knee surgery Bilateral   . Back surgery    . Cholecystectomy    . Tonsillectomy    . Appendectomy    . Abdominal hysterectomy    . Shoulder surgery    . Nasal sinus surgery     Social History  Substance Use Topics  . Smoking status: Never Smoker   . Smokeless tobacco: Not on file  . Alcohol Use: No   family history includes CAD in her father; Heart attack in her father.  ROS as above Medications: Current  Outpatient Prescriptions  Medication Sig Dispense Refill  . ADVAIR DISKUS 500-50 MCG/DOSE AEPB INHALE 1 PUFF INTO THE LUNGS TWICE DAILY 60 each 0  . ALPRAZolam (XANAX) 0.25 MG tablet Take 1 tablet (0.25 mg total) by mouth at bedtime as needed for anxiety. 20 tablet 0  . AMBULATORY NON FORMULARY MEDICATION Diabetic Test Strips and Lancets: One Touch Ultra.   Dx Type 2 Diabetes.e11.21  Use to check blood sugar twice a day. 100 Units 3  . AMBULATORY NON FORMULARY MEDICATION Insulin needles for lantus solastar  31g x4 100 each 11  . apixaban (ELIQUIS) 5 MG TABS tablet Take 1 tablet (5 mg total) by mouth 2 (two) times daily. 60 tablet 2  . atorvastatin (LIPITOR) 40 MG tablet TAKE 1 TABLET(40 MG) BY MOUTH DAILY AT 6 PM 30 tablet 0  . Calcium Carbonate-Vitamin D (CALTRATE 600+D PO) Take by mouth 2 (two) times daily.    . carvedilol (COREG) 6.25 MG tablet TAKE 1 TABLET BY MOUTH TWICE DAILY WITH MEALS 60 tablet 0  . escitalopram (LEXAPRO) 5 MG tablet Take 1 tablet (5 mg total) by mouth daily. 90 tablet 0  . FeFum-FePoly-FA-B Cmp-C-Biot (INTEGRA PLUS) CAPS TAKE 1 CAPSULE BY MOUTH DAILY 30 capsule 0  . fesoterodine (TOVIAZ) 8 MG TB24 tablet Take 1 tablet (8 mg total) by mouth daily. 30 tablet   . Insulin Glargine (TOUJEO SOLOSTAR) 300 UNIT/ML SOPN Inject 23  Units into the skin daily. 4.5 mL 2  . IRON PO Take by mouth.    . levothyroxine (SYNTHROID, LEVOTHROID) 88 MCG tablet TAKE 1 TABLET BY MOUTH DAILY BEFORE BREAKFAST 90 tablet 0  . lisinopril (PRINIVIL,ZESTRIL) 10 MG tablet TAKE 1 TABLET BY MOUTH ONCE DAILY 30 tablet 2  . montelukast (SINGULAIR) 10 MG tablet TAKE 1 TABLET BY MOUTH AT BEDTIME 30 tablet 0  . Multiple Vitamin (MULTIVITAMIN) capsule Take 1 capsule by mouth.    Marland Kitchen MYRBETRIQ 50 MG TB24 tablet TAKE 1 TABLET(50 MG) BY MOUTH DAILY 30 tablet 0  . ONE TOUCH ULTRA TEST test strip CHECK BLOOD GLUCOSE TWICE DAILY 100 each 1  . oxyCODONE-acetaminophen (PERCOCET) 10-325 MG tablet Take 0.5-1 tablets by  mouth 2 (two) times daily as needed for pain. 60 tablet 0  . pantoprazole (PROTONIX) 40 MG tablet TAKE 1 TABLET BY MOUTH DAILY 30 tablet 0  . pantoprazole (PROTONIX) 40 MG tablet TAKE 1 TABLET BY MOUTH DAILY 30 tablet 0  . PENNSAID 2 % SOLN APPLY NICKEL SIZED DOLLOP TO EACH KNEE AND RUB IN UP TO FOUR TIMES DAILY 112 g 0  . rivastigmine (EXELON) 1.5 MG capsule Take 1 capsule (1.5 mg total) by mouth 2 (two) times daily. 180 capsule 3  . sitaGLIPtin (JANUVIA) 50 MG tablet Take 1 tablet (50 mg total) by mouth daily. 90 tablet 0  . tolterodine (DETROL LA) 2 MG 24 hr capsule TAKE 1 CAPSULE(2 MG) BY MOUTH DAILY 30 capsule 0  . albuterol (PROVENTIL HFA;VENTOLIN HFA) 108 (90 BASE) MCG/ACT inhaler Inhale two puffs every 4-6 hours only as needed for shortness of breath or wheezing. 1 Inhaler 1   No current facility-administered medications for this visit.   Allergies  Allergen Reactions  . Tetracycline Shortness Of Breath  . Tetracyclines & Related Anaphylaxis and Shortness Of Breath    dizziness  . Amlodipine     Edema  . Amoxicillin     Questionable rash  . Bactrim [Sulfamethoxazole-Trimethoprim]     dizzy  . Latex Dermatitis    Bandages, pulls skin  . Tape Dermatitis    Bandaids      Exam:  BP 107/61 mmHg  Pulse 103  Wt 169 lb (76.658 kg) Gen: Well NAD HEENT: EOMI,  MMM Lungs: Normal work of breathing. CTABL Heart: RRR no MRG Abd: NABS, Soft. Nondistended, Nontender Exts: Brisk capillary refill, warm and well perfused.  Back: Nontender to midline. Diffusely tender bilateral lumbar and thoracic paraspinals. Decreased motion due to pain. Lower extremity strength is equal and normal throughout. Normal gait.  No results found for this or any previous visit (from the past 24 hour(s)). No results found.   Please see individual assessment and plan sections.

## 2016-01-16 DIAGNOSIS — M5440 Lumbago with sciatica, unspecified side: Secondary | ICD-10-CM | POA: Diagnosis not present

## 2016-01-16 DIAGNOSIS — E119 Type 2 diabetes mellitus without complications: Secondary | ICD-10-CM | POA: Diagnosis not present

## 2016-01-16 DIAGNOSIS — J45909 Unspecified asthma, uncomplicated: Secondary | ICD-10-CM | POA: Diagnosis not present

## 2016-01-16 DIAGNOSIS — J449 Chronic obstructive pulmonary disease, unspecified: Secondary | ICD-10-CM | POA: Diagnosis not present

## 2016-01-16 DIAGNOSIS — B028 Zoster with other complications: Secondary | ICD-10-CM | POA: Diagnosis not present

## 2016-01-16 DIAGNOSIS — M5417 Radiculopathy, lumbosacral region: Secondary | ICD-10-CM | POA: Diagnosis not present

## 2016-01-19 ENCOUNTER — Other Ambulatory Visit: Payer: Self-pay | Admitting: Family Medicine

## 2016-01-19 DIAGNOSIS — M5416 Radiculopathy, lumbar region: Secondary | ICD-10-CM

## 2016-01-20 DIAGNOSIS — B028 Zoster with other complications: Secondary | ICD-10-CM | POA: Diagnosis not present

## 2016-01-20 DIAGNOSIS — J449 Chronic obstructive pulmonary disease, unspecified: Secondary | ICD-10-CM | POA: Diagnosis not present

## 2016-01-20 DIAGNOSIS — M5417 Radiculopathy, lumbosacral region: Secondary | ICD-10-CM | POA: Diagnosis not present

## 2016-01-20 DIAGNOSIS — M5440 Lumbago with sciatica, unspecified side: Secondary | ICD-10-CM | POA: Diagnosis not present

## 2016-01-20 DIAGNOSIS — E119 Type 2 diabetes mellitus without complications: Secondary | ICD-10-CM | POA: Diagnosis not present

## 2016-01-20 DIAGNOSIS — J45909 Unspecified asthma, uncomplicated: Secondary | ICD-10-CM | POA: Diagnosis not present

## 2016-01-22 DIAGNOSIS — E119 Type 2 diabetes mellitus without complications: Secondary | ICD-10-CM | POA: Diagnosis not present

## 2016-01-22 DIAGNOSIS — M5417 Radiculopathy, lumbosacral region: Secondary | ICD-10-CM | POA: Diagnosis not present

## 2016-01-22 DIAGNOSIS — J449 Chronic obstructive pulmonary disease, unspecified: Secondary | ICD-10-CM | POA: Diagnosis not present

## 2016-01-22 DIAGNOSIS — J45909 Unspecified asthma, uncomplicated: Secondary | ICD-10-CM | POA: Diagnosis not present

## 2016-01-22 DIAGNOSIS — M5440 Lumbago with sciatica, unspecified side: Secondary | ICD-10-CM | POA: Diagnosis not present

## 2016-01-22 DIAGNOSIS — B028 Zoster with other complications: Secondary | ICD-10-CM | POA: Diagnosis not present

## 2016-01-23 ENCOUNTER — Encounter: Payer: Self-pay | Admitting: Family Medicine

## 2016-01-26 ENCOUNTER — Encounter: Payer: Self-pay | Admitting: Family Medicine

## 2016-01-26 ENCOUNTER — Ambulatory Visit (INDEPENDENT_AMBULATORY_CARE_PROVIDER_SITE_OTHER): Payer: Medicare Other | Admitting: Family Medicine

## 2016-01-26 VITALS — BP 178/64 | HR 67 | Wt 166.0 lb

## 2016-01-26 DIAGNOSIS — B029 Zoster without complications: Secondary | ICD-10-CM

## 2016-01-26 DIAGNOSIS — I10 Essential (primary) hypertension: Secondary | ICD-10-CM

## 2016-01-26 MED ORDER — GABAPENTIN 300 MG PO CAPS: 300.0000 mg | ORAL_CAPSULE | Freq: Three times a day (TID) | ORAL | Status: DC

## 2016-01-26 NOTE — Progress Notes (Signed)
CC: Briana Werner is a 80 y.o. female is here for Hospitalization Follow-up   Subjective: HPI:  Hospital follow-up for herpes zoster: She's completed a full 7 days of Valtrex, she is taking gabapentin 100 mg 3 times a day and the pain on her skin in the left leg is still bothering her on a daily basis. Worse to the touch. She tells me the skin lesions have crusted over now and are looking better. She tells me the pain is substantially better from the day she was hospitalized however still interfere with quality of life. She denies any joint pain in the left lower extremity. She denies any fevers, chills nor redness of the skin.  Follow-up essential hypertension: She tells me that blood pressures at home have been in the normotensive range. No recent changes to her antihypertensive regimen. She is taking lisinopril and carvedilol with 100% compliance. Denies chest pain shortness of breath orthopnea or peripheral edema   Review Of Systems Outlined In HPI  Past Medical History  Diagnosis Date  . Chronic renal insufficiency 02/26/2014  . COPD (chronic obstructive pulmonary disease) ([place]) 02/26/2014  . Hypothyroid 02/26/2014  . Presence of stent in coronary artery in patient with coronary artery disease 02/26/2014  . Type 2 diabetes mellitus ([place]) 02/26/2014  . Asthma   . CAD (coronary artery disease)   . Renal artery stenosis ([place])   . Atrial flutter ([place])   . Cerebrovascular disease     Past Surgical History  Procedure Laterality Date  . Coronary artery bypass graft      2004, [place]  . Hip replacement    . Knee surgery Bilateral   . Back surgery    . Cholecystectomy    . Tonsillectomy    . Appendectomy    . Abdominal hysterectomy    . Shoulder surgery    . Nasal sinus surgery     Family History  Problem Relation Age of Onset  . CAD Father   . Heart attack Father     Social History   Social History  . Marital Status: Married    Spouse Name: N/A  . Number of Children: 4  .  Years of Education: N/A   Occupational History  . Not on file.   Social History Main Topics  . Smoking status: Never Smoker   . Smokeless tobacco: Not on file  . Alcohol Use: No  . Drug Use: No  . Sexual Activity: No   Other Topics Concern  . Not on file   Social History Narrative     Objective: BP 178/64 mmHg  Pulse 67  Wt 166 lb (75.297 kg)  General: Alert and Oriented, No Acute Distress HEENT: Pupils equal, round, reactive to light. Conjunctivae clear.  Moist mucous membranes Lungs: Clear to auscultation bilaterally, no wheezing/ronchi/rales.  Comfortable work of breathing. Good air movement. Cardiac: Regular rate and rhythm. Normal S1/S2.  No murmurs, rubs, nor gallops.   Extremities: No peripheral edema.  Strong peripheral pulses.  Mental Status: No depression, anxiety, nor agitation. Skin: Warm and dry. Crusted ulcerations ranging between 2 mm-8 mm involving the left buttock, left medial thigh and left medial shin.  Assessment & Plan: Briana Werner was seen today for hospitalization follow-up.  Diagnoses and all orders for this visit:  Herpes zoster -     gabapentin (NEURONTIN) 300 MG capsule; Take 1 capsule (300 mg total) by mouth 3 (three) times daily.  Essential hypertension, benign   Herpes zoster: Improving however pain is still interfere  with quality of life therefore increasing gabapentin. Essential hypertension: Uncontrolled chronic condition continued carvedilol and lisinopril. I have asked her to give me a blood pressure diary from home so I can be confident that pressures are normal at home, we also discussed the DASH diet and reducing sodium in her diet, large room for improvement.  Return if symptoms worsen or fail to improve.

## 2016-01-28 ENCOUNTER — Other Ambulatory Visit: Payer: Self-pay | Admitting: Family Medicine

## 2016-01-28 DIAGNOSIS — B028 Zoster with other complications: Secondary | ICD-10-CM | POA: Diagnosis not present

## 2016-01-28 DIAGNOSIS — E119 Type 2 diabetes mellitus without complications: Secondary | ICD-10-CM | POA: Diagnosis not present

## 2016-01-28 DIAGNOSIS — M5440 Lumbago with sciatica, unspecified side: Secondary | ICD-10-CM | POA: Diagnosis not present

## 2016-01-28 DIAGNOSIS — J449 Chronic obstructive pulmonary disease, unspecified: Secondary | ICD-10-CM | POA: Diagnosis not present

## 2016-01-28 DIAGNOSIS — M5417 Radiculopathy, lumbosacral region: Secondary | ICD-10-CM | POA: Diagnosis not present

## 2016-01-28 DIAGNOSIS — J45909 Unspecified asthma, uncomplicated: Secondary | ICD-10-CM | POA: Diagnosis not present

## 2016-01-30 ENCOUNTER — Telehealth: Payer: Self-pay | Admitting: Family Medicine

## 2016-01-30 ENCOUNTER — Other Ambulatory Visit: Payer: Self-pay | Admitting: Family Medicine

## 2016-01-30 DIAGNOSIS — B028 Zoster with other complications: Secondary | ICD-10-CM | POA: Diagnosis not present

## 2016-01-30 DIAGNOSIS — J449 Chronic obstructive pulmonary disease, unspecified: Secondary | ICD-10-CM | POA: Diagnosis not present

## 2016-01-30 DIAGNOSIS — M5440 Lumbago with sciatica, unspecified side: Secondary | ICD-10-CM | POA: Diagnosis not present

## 2016-01-30 DIAGNOSIS — J45909 Unspecified asthma, uncomplicated: Secondary | ICD-10-CM | POA: Diagnosis not present

## 2016-01-30 DIAGNOSIS — M5417 Radiculopathy, lumbosacral region: Secondary | ICD-10-CM | POA: Diagnosis not present

## 2016-01-30 DIAGNOSIS — E119 Type 2 diabetes mellitus without complications: Secondary | ICD-10-CM | POA: Diagnosis not present

## 2016-01-30 NOTE — Telephone Encounter (Signed)
[facility] stating Pt advised him she fell yesterday while working in the yard.  Called Pt, she states she fell into the grass pretty hard. Does have a "large bruise" on her left arm. Pt reports she did hit her head "a little but nothing major." No LOC. EMS did come and evaluate Pt. Advised Pt to schedule a follow up with PCP next week and if she starts to feel dizzy/lightheaded/SOB/vision changes/etc to go to ED. verbalized understanding.

## 2016-02-03 DIAGNOSIS — B028 Zoster with other complications: Secondary | ICD-10-CM | POA: Diagnosis not present

## 2016-02-03 DIAGNOSIS — M5440 Lumbago with sciatica, unspecified side: Secondary | ICD-10-CM | POA: Diagnosis not present

## 2016-02-03 DIAGNOSIS — J449 Chronic obstructive pulmonary disease, unspecified: Secondary | ICD-10-CM | POA: Diagnosis not present

## 2016-02-03 DIAGNOSIS — E119 Type 2 diabetes mellitus without complications: Secondary | ICD-10-CM | POA: Diagnosis not present

## 2016-02-03 DIAGNOSIS — M5417 Radiculopathy, lumbosacral region: Secondary | ICD-10-CM | POA: Diagnosis not present

## 2016-02-03 DIAGNOSIS — J45909 Unspecified asthma, uncomplicated: Secondary | ICD-10-CM | POA: Diagnosis not present

## 2016-02-04 ENCOUNTER — Ambulatory Visit: Payer: Medicare Other | Admitting: Sports Medicine

## 2016-02-04 ENCOUNTER — Ambulatory Visit (INDEPENDENT_AMBULATORY_CARE_PROVIDER_SITE_OTHER): Payer: Medicare Other

## 2016-02-04 ENCOUNTER — Encounter: Payer: Self-pay | Admitting: Family Medicine

## 2016-02-04 ENCOUNTER — Ambulatory Visit (INDEPENDENT_AMBULATORY_CARE_PROVIDER_SITE_OTHER): Payer: Medicare Other | Admitting: Family Medicine

## 2016-02-04 VITALS — BP 162/73 | HR 90 | Wt 168.0 lb

## 2016-02-04 DIAGNOSIS — R5383 Other fatigue: Secondary | ICD-10-CM | POA: Diagnosis not present

## 2016-02-04 DIAGNOSIS — R062 Wheezing: Secondary | ICD-10-CM | POA: Diagnosis not present

## 2016-02-04 DIAGNOSIS — T148 Other injury of unspecified body region: Secondary | ICD-10-CM | POA: Diagnosis not present

## 2016-02-04 DIAGNOSIS — J329 Chronic sinusitis, unspecified: Secondary | ICD-10-CM | POA: Diagnosis not present

## 2016-02-04 DIAGNOSIS — S0990XA Unspecified injury of head, initial encounter: Secondary | ICD-10-CM | POA: Diagnosis not present

## 2016-02-04 DIAGNOSIS — G319 Degenerative disease of nervous system, unspecified: Secondary | ICD-10-CM | POA: Diagnosis not present

## 2016-02-04 DIAGNOSIS — S060X0A Concussion without loss of consciousness, initial encounter: Secondary | ICD-10-CM

## 2016-02-04 DIAGNOSIS — T148XXA Other injury of unspecified body region, initial encounter: Secondary | ICD-10-CM

## 2016-02-04 MED ORDER — PREDNISONE 20 MG PO TABS: ORAL_TABLET | ORAL | Status: AC

## 2016-02-04 NOTE — Progress Notes (Signed)
   Subjective:    I'm seeing this patient as a consultation for:  Dr. Marcial Pacas  CC: Forearm hematoma  HPI: 5 days ago this pleasant 80 year old female fell, she had immediate pain, swelling, bruising over the forearm. She does have what appears to be a large hematoma. I'm called for further evaluation and definitive treatment. Pain is moderate, persistent.  Past medical history, Surgical history, Family history not pertinant except as noted below, Social history, Allergies, and medications have been entered into the medical record, reviewed, and no changes needed.   Review of Systems: No headache, visual changes, nausea, vomiting, diarrhea, constipation, dizziness, abdominal pain, skin rash, fevers, chills, night sweats, weight loss, swollen lymph nodes, body aches, joint swelling, muscle aches, chest pain, shortness of breath, mood changes, visual or auditory hallucinations.   Objective:   General: Well Developed, well nourished, and in no acute distress.  Neuro/Psych: Alert and oriented x3, extra-ocular muscles intact, able to move all 4 extremities, sensation grossly intact. Skin: Warm and dry, no rashes noted.  Respiratory: Not using accessory muscles, speaking in full sentences, trachea midline.  Cardiovascular: Pulses palpable, no extremity edema. Abdomen: Does not appear distended. Right Elbow: Unremarkable to inspection. Range of motion full pronation, supination, flexion, extension. Strength is full to all of the above directions Stable to varus, valgus stress. Negative moving valgus stress test. No discrete areas of tenderness to palpation. Ulnar nerve does not sublux. Negative cubital tunnel Tinel's. Hematoma over the dorsal mid forearm.  Procedure: Real-time Ultrasound Guided aspiration/Injection of right forearm hematoma Device: GE Logiq E  Verbal informed consent obtained.  Time-out conducted.  Noted no overlying erythema, induration, or other signs of local  infection.  Skin prepped in a sterile fashion.  Local anesthesia: Topical Ethyl chloride.  With sterile technique and under real time ultrasound guidance:  Noted pulsatile mass, likely superficial arteriole. 18-gauge needle advanced into the hematoma, no fluid was able to be aspirated, syringe switched and 1 mL lidocaine, 1 mL kenalog 40 injected easily. Completed without difficulty  Pain immediately resolved suggesting accurate placement of the medication.  Advised to call if fevers/chills, erythema, induration, drainage, or persistent bleeding.  Images permanently stored and available for review in the ultrasound unit.  Impression: Technically successful ultrasound guided injection.  The forearm and wrist were then strapped with compressive dressing.  Impression and Recommendations:   This case required medical decision making of moderate complexity.  1. Right forearm hematoma: Aspiration and injection as above, strapped with compressive dressing, return to see me in one month. ___________________________________________ Gwen Her. Dianah Field, M.D., ABFM., CAQSM. Primary Care and [place] Instructor of [employer] [facility]

## 2016-02-04 NOTE — Progress Notes (Signed)
CC: Briana Werner is a 80 y.o. female is here for fell and hit head   Subjective: HPI:  5 days ago she was working in her yard and fell forward striking the ground with her right forearm and spectator's believe that she hit her head as well. Patient does not remember striking her head.  Her since the fall she swelled fatigue and is having difficulty concentrating. She denies any loss of consciousness that occurred from the accident. She denies any vision disturbance or amnesia. She denies any nausea but has had some vague headache ever since the event. Her major complaint today is just feeling fatigued like she is not able to catch up on rest/sleeping. She denies any dizziness. She denies any photophobia. review of systems is positive for wheezing, shortness of breath and cough.  Review Of Systems Outlined In HPI  Past Medical History  Diagnosis Date  . Chronic renal insufficiency 02/26/2014  . COPD (chronic obstructive pulmonary disease) ([place]) 02/26/2014  . Hypothyroid 02/26/2014  . Presence of stent in coronary artery in patient with coronary artery disease 02/26/2014  . Type 2 diabetes mellitus ([place]) 02/26/2014  . Asthma   . CAD (coronary artery disease)   . Renal artery stenosis ([place])   . Atrial flutter ([place])   . Cerebrovascular disease     Past Surgical History  Procedure Laterality Date  . Coronary artery bypass graft      2004, [place]  . Hip replacement    . Knee surgery Bilateral   . Back surgery    . Cholecystectomy    . Tonsillectomy    . Appendectomy    . Abdominal hysterectomy    . Shoulder surgery    . Nasal sinus surgery     Family History  Problem Relation Age of Onset  . CAD Father   . Heart attack Father     Social History   Social History  . Marital Status: Married    Spouse Name: N/A  . Number of Children: 4  . Years of Education: N/A   Occupational History  . Not on file.   Social History Main Topics  . Smoking status: Never Smoker   . Smokeless  tobacco: Not on file  . Alcohol Use: No  . Drug Use: No  . Sexual Activity: No   Other Topics Concern  . Not on file   Social History Narrative     Objective: BP 162/73 mmHg  Pulse 90  Wt 168 lb (76.204 kg)  General: Alert and Oriented To person place time and date, No Acute Distress HEENT: Pupils equal, round, reactive to light. Conjunctivae clear. Moist mucous membranes. Unable to identify any hematoma or skin abrasion on the scalp.  Lungs:Comfortable work of breathing with mild wheezing in all lung fields. No rhonchi or rales  Cardiac:Irregularly irregular rhythm at a rate of less than 100 bpm but greater than 60 bpm.. Normal S1/S2.  No murmurs, rubs, nor gallops.   Extremities: No peripheral edema.  Strong peripheral pulses.  Mental Status: No depression, anxiety, nor agitation. Skin: Warm and dry.Approximately 2 cm diameter tender raised hematoma on the right dorsal forearm.  Assessment & Plan: [name] was seen today for fell and hit head.  Diagnoses and all orders for this visit:  Other fatigue -     CBC -     COMPLETE METABOLIC PANEL WITH GFR  Hematoma -     CT Head Wo Contrast -     CBC  Concussion with  no loss of consciousness, initial encounter -     CT Head Wo Contrast  Wheezing -     predniSONE (DELTASONE) 20 MG tablet; Three tabs at once daily for five days.   Fatigue: Ruling out anemia given her recent vascular blood loss into the soft tissue space of the forearm. Hematoma over the forearm: I offered consultation to our sports medicine doctor for consideration of drainage, she would like to go through with this. Suspect that she sustained a concussion with her fall, this could be causing some of her fatigue is definitely causing some of her self-reported difficulty with concentration. Given her use of anticoagulation I like to get a CT scan to rule out subdural hematoma.   No Follow-up on file.

## 2016-02-05 LAB — COMPLETE METABOLIC PANEL WITH GFR
ALBUMIN: 4 g/dL (ref 3.6–5.1)
ALT: 12 U/L (ref 6–29)
AST: 37 U/L — AB (ref 10–35)
Alkaline Phosphatase: 102 U/L (ref 33–130)
BILIRUBIN TOTAL: 0.7 mg/dL (ref 0.2–1.2)
BUN: 35 mg/dL — AB (ref 7–25)
CHLORIDE: 103 mmol/L (ref 98–110)
CO2: 28 mmol/L (ref 20–31)
CREATININE: 1.93 mg/dL — AB (ref 0.60–0.93)
Calcium: 9.8 mg/dL (ref 8.6–10.4)
GFR, Est African American: 28 mL/min — ABNORMAL LOW (ref >=60)
GFR, Est Non African American: 25 mL/min — ABNORMAL LOW (ref >=60)
GLUCOSE: 231 mg/dL — AB (ref 65–99)
POTASSIUM: 5 mmol/L (ref 3.5–5.3)
SODIUM: 139 mmol/L (ref 135–146)
Total Protein: 7.2 g/dL (ref 6.1–8.1)

## 2016-02-05 LAB — CBC
HEMATOCRIT: 38 % (ref 35.0–45.0)
HEMOGLOBIN: 12.2 g/dL (ref 11.7–15.5)
MCH: 30.2 pg (ref 27.0–33.0)
MCHC: 32.1 g/dL (ref 32.0–36.0)
MCV: 94.1 fL (ref 80.0–100.0)
MPV: 10.6 fL (ref 7.5–12.5)
Platelets: 245 10*3/uL (ref 140–400)
RBC: 4.04 MIL/uL (ref 3.80–5.10)
RDW: 14.2 % (ref 11.0–15.0)
WBC: 11.8 10*3/uL — AB (ref 3.8–10.8)

## 2016-02-06 ENCOUNTER — Telehealth: Payer: Self-pay | Admitting: *Deleted

## 2016-02-06 DIAGNOSIS — J449 Chronic obstructive pulmonary disease, unspecified: Secondary | ICD-10-CM | POA: Diagnosis not present

## 2016-02-06 DIAGNOSIS — E119 Type 2 diabetes mellitus without complications: Secondary | ICD-10-CM | POA: Diagnosis not present

## 2016-02-06 DIAGNOSIS — M5417 Radiculopathy, lumbosacral region: Secondary | ICD-10-CM | POA: Diagnosis not present

## 2016-02-06 DIAGNOSIS — M5440 Lumbago with sciatica, unspecified side: Secondary | ICD-10-CM | POA: Diagnosis not present

## 2016-02-06 DIAGNOSIS — B028 Zoster with other complications: Secondary | ICD-10-CM | POA: Diagnosis not present

## 2016-02-06 DIAGNOSIS — J45909 Unspecified asthma, uncomplicated: Secondary | ICD-10-CM | POA: Diagnosis not present

## 2016-02-06 MED ORDER — CEFDINIR 300 MG PO CAPS: 300.0000 mg | ORAL_CAPSULE | Freq: Two times a day (BID) | ORAL | Status: AC

## 2016-02-06 NOTE — Telephone Encounter (Signed)
Called patient to inquire about another issue. While on the phone asked her how she was feeling. She states she feels worse today.she has so far taken one dose of the prednisone. She states she was considering going to the ED because she felt so bad. She denies fever. Please advise if any other reccomendations

## 2016-02-06 NOTE — Telephone Encounter (Signed)
Will you please let patient know that I'd recommend she start on an antibiotic called cefdinir that I've sent to her [facility] and call me when we reopen next week.

## 2016-02-06 NOTE — Telephone Encounter (Signed)
Pt.notified

## 2016-02-08 DIAGNOSIS — J449 Chronic obstructive pulmonary disease, unspecified: Secondary | ICD-10-CM | POA: Diagnosis not present

## 2016-02-08 DIAGNOSIS — E782 Mixed hyperlipidemia: Secondary | ICD-10-CM | POA: Diagnosis not present

## 2016-02-08 DIAGNOSIS — N183 Chronic kidney disease, stage 3 (moderate): Secondary | ICD-10-CM | POA: Diagnosis not present

## 2016-02-08 DIAGNOSIS — E1140 Type 2 diabetes mellitus with diabetic neuropathy, unspecified: Secondary | ICD-10-CM | POA: Diagnosis not present

## 2016-02-08 DIAGNOSIS — I2510 Atherosclerotic heart disease of native coronary artery without angina pectoris: Secondary | ICD-10-CM | POA: Diagnosis not present

## 2016-02-08 DIAGNOSIS — Z881 Allergy status to other antibiotic agents status: Secondary | ICD-10-CM | POA: Diagnosis not present

## 2016-02-08 DIAGNOSIS — E1165 Type 2 diabetes mellitus with hyperglycemia: Secondary | ICD-10-CM | POA: Diagnosis not present

## 2016-02-08 DIAGNOSIS — G301 Alzheimer's disease with late onset: Secondary | ICD-10-CM | POA: Diagnosis not present

## 2016-02-08 DIAGNOSIS — E038 Other specified hypothyroidism: Secondary | ICD-10-CM | POA: Diagnosis not present

## 2016-02-08 DIAGNOSIS — Z87442 Personal history of urinary calculi: Secondary | ICD-10-CM | POA: Diagnosis not present

## 2016-02-08 DIAGNOSIS — I1310 Hypertensive heart and chronic kidney disease without heart failure, with stage 1 through stage 4 chronic kidney disease, or unspecified chronic kidney disease: Secondary | ICD-10-CM | POA: Diagnosis not present

## 2016-02-08 DIAGNOSIS — F0280 Dementia in other diseases classified elsewhere without behavioral disturbance: Secondary | ICD-10-CM | POA: Diagnosis not present

## 2016-02-08 DIAGNOSIS — Z91048 Other nonmedicinal substance allergy status: Secondary | ICD-10-CM | POA: Diagnosis not present

## 2016-02-08 DIAGNOSIS — I4891 Unspecified atrial fibrillation: Secondary | ICD-10-CM | POA: Diagnosis not present

## 2016-02-08 DIAGNOSIS — Z91040 Latex allergy status: Secondary | ICD-10-CM | POA: Diagnosis not present

## 2016-02-08 DIAGNOSIS — N3281 Overactive bladder: Secondary | ICD-10-CM | POA: Diagnosis not present

## 2016-02-08 DIAGNOSIS — Z794 Long term (current) use of insulin: Secondary | ICD-10-CM | POA: Diagnosis not present

## 2016-02-08 DIAGNOSIS — Z882 Allergy status to sulfonamides status: Secondary | ICD-10-CM | POA: Diagnosis not present

## 2016-02-08 DIAGNOSIS — N10 Acute tubulo-interstitial nephritis: Secondary | ICD-10-CM | POA: Diagnosis not present

## 2016-02-08 DIAGNOSIS — E1122 Type 2 diabetes mellitus with diabetic chronic kidney disease: Secondary | ICD-10-CM | POA: Diagnosis not present

## 2016-02-08 DIAGNOSIS — Z79899 Other long term (current) drug therapy: Secondary | ICD-10-CM | POA: Diagnosis not present

## 2016-02-08 DIAGNOSIS — I6523 Occlusion and stenosis of bilateral carotid arteries: Secondary | ICD-10-CM | POA: Diagnosis not present

## 2016-02-08 DIAGNOSIS — D721 Eosinophilia: Secondary | ICD-10-CM | POA: Diagnosis not present

## 2016-02-10 ENCOUNTER — Ambulatory Visit (INDEPENDENT_AMBULATORY_CARE_PROVIDER_SITE_OTHER): Payer: Medicare Other

## 2016-02-10 ENCOUNTER — Encounter: Payer: Self-pay | Admitting: Family Medicine

## 2016-02-10 ENCOUNTER — Ambulatory Visit (INDEPENDENT_AMBULATORY_CARE_PROVIDER_SITE_OTHER): Payer: Medicare Other | Admitting: Family Medicine

## 2016-02-10 VITALS — BP 171/82 | HR 94 | Wt 154.0 lb

## 2016-02-10 DIAGNOSIS — M4310 Spondylolisthesis, site unspecified: Secondary | ICD-10-CM | POA: Diagnosis not present

## 2016-02-10 DIAGNOSIS — B029 Zoster without complications: Secondary | ICD-10-CM | POA: Diagnosis not present

## 2016-02-10 DIAGNOSIS — M25562 Pain in left knee: Secondary | ICD-10-CM

## 2016-02-10 DIAGNOSIS — S8992XA Unspecified injury of left lower leg, initial encounter: Secondary | ICD-10-CM | POA: Diagnosis not present

## 2016-02-10 DIAGNOSIS — E1121 Type 2 diabetes mellitus with diabetic nephropathy: Secondary | ICD-10-CM

## 2016-02-10 DIAGNOSIS — J441 Chronic obstructive pulmonary disease with (acute) exacerbation: Secondary | ICD-10-CM

## 2016-02-10 MED ORDER — OXYCODONE-ACETAMINOPHEN 10-325 MG PO TABS: 0.5000 | ORAL_TABLET | Freq: Two times a day (BID) | ORAL | Status: DC | PRN

## 2016-02-10 MED ORDER — INSULIN GLARGINE 300 UNIT/ML ~~LOC~~ SOPN: 38.0000 [IU] | PEN_INJECTOR | Freq: Every day | SUBCUTANEOUS | Status: DC

## 2016-02-10 MED ORDER — ALBUTEROL SULFATE HFA 108 (90 BASE) MCG/ACT IN AERS: INHALATION_SPRAY | RESPIRATORY_TRACT | Status: DC

## 2016-02-10 NOTE — Progress Notes (Signed)
CC: Briana Werner Werner is a 80 y.o. female is here for Blood Sugar Problem   Subjective: HPI:  She comes in today for medication reconciliation. Her son is going to start taking over medication management.    She has 3 different overactive bladder medications on her medication list.  She tells me that Myrbetric did not work, and Lisbeth Ply made her sick. She is currently taking Detrol LA on a daily basis with good response.  She is only able to take 4 days of prednisone due to hyperglycemia. Blood sugars are ranging from 200-300 after stopping this medication. She is currently taking 30 units of Lantus and is open to the idea of increasing this.  She continued to have left knee pain following a fall that occurred about 2 weeks ago. It's causing her to walk differently with a limp and has flareups of her chronic back pain. She ran out of Percocet and had difficulty falling asleep last night due to the pain. She denies any motor or sensory disturbances in the lower extremities other than continued pain at a few sites were she had shingles, gabapentin is helping somewhat.  She still has a cough but is no longer apparent and shortness of breath. The last time she refilled albuterol was over 2 years ago. She does not have any other inhaler at home right now   Review Of Systems Outlined In HPI  Past Medical History  Diagnosis Date  . Chronic renal insufficiency 02/26/2014  . COPD (chronic obstructive pulmonary disease) ([place]) 02/26/2014  . Hypothyroid 02/26/2014  . Presence of stent in coronary artery in patient with coronary artery disease 02/26/2014  . Type 2 diabetes mellitus ([place]) 02/26/2014  . Asthma   . CAD (coronary artery disease)   . Renal artery stenosis ([place])   . Atrial flutter ([place])   . Cerebrovascular disease     Past Surgical History  Procedure Laterality Date  . Coronary artery bypass graft      2004, [place]  . Hip replacement    . Knee surgery Bilateral   . Back surgery    .  Cholecystectomy    . Tonsillectomy    . Appendectomy    . Abdominal hysterectomy    . Shoulder surgery    . Nasal sinus surgery     Family History  Problem Relation Age of Onset  . CAD Father   . Heart attack Father     Social History   Social History  . Marital Status: Married    Spouse Name: N/A  . Number of Children: 4  . Years of Education: N/A   Occupational History  . Not on file.   Social History Main Topics  . Smoking status: Never Smoker   . Smokeless tobacco: Not on file  . Alcohol Use: No  . Drug Use: No  . Sexual Activity: No   Other Topics Concern  . Not on file   Social History Narrative     Objective: BP 171/82 mmHg  Pulse 94  Wt 154 lb (69.854 kg)  General: Alert and Oriented, No Acute Distress HEENT: Pupils equal, round, reactive to light. Conjunctivae clear. Moist mucous membranes pharynx unremarkable  Lungs: Clear to auscultation bilaterally, no wheezing/ronchi/rales.  Comfortable work of breathing. Good air movement. Cardiac: Regular rate and rhythm. Normal S1/S2.  No murmurs, rubs, nor gallops.   Extremities: No peripheral edema.  Strong peripheral pulses.  Mental Status: No depression, anxiety, nor agitation. Skin: Warm and dry.  Assessment & Plan:  [name] was seen today for blood sugar problem.  Diagnoses and all orders for this visit:  Spondylisthesis -     oxyCODONE-acetaminophen (PERCOCET) 10-325 MG tablet; Take 0.5-1 tablets by mouth 2 (two) times daily as needed for pain.  Chronic obstructive pulmonary disease with acute exacerbation (HCC) -     albuterol (PROVENTIL HFA;VENTOLIN HFA) 108 (90 Base) MCG/ACT inhaler; Inhale two puffs every 4-6 hours only as needed for shortness of breath or wheezing.  Left knee pain -     DG Knee Complete 4 Views Left; Future  Herpes zoster  Type 2 diabetes mellitus with diabetic nephropathy, unspecified long term insulin use status (HCC) -     Insulin Glargine (TOUJEO SOLOSTAR) 300 UNIT/ML  SOPN; Inject 38 Units into the skin daily.   Percocet refill for chronic back pain and new left knee pain pending x-ray results COPD exacerbation: Continues to be somewhat symptomatic however prednisone did seem to help with her wheezing somewhat. Begin taking albuterol when necessary Type 2 diabetes: Uncontrolled chronic condition with discuss going up 2 units every 2 days that she is fasting and still above 120 , she and her son would like to wait to do this until next week.   25 minutes spent face-to-face during visit today of which at least 50% was counseling or coordinating care regarding: 1. Spondylisthesis   2. Chronic obstructive pulmonary disease with acute exacerbation ([place])   3. Left knee pain   4. Herpes zoster   5. Type 2 diabetes mellitus with diabetic nephropathy, unspecified long term insulin use status ([place])     Return in about 4 weeks (around 03/09/2016) for Blood Sugar.

## 2016-02-11 DIAGNOSIS — J449 Chronic obstructive pulmonary disease, unspecified: Secondary | ICD-10-CM | POA: Diagnosis not present

## 2016-02-11 DIAGNOSIS — E119 Type 2 diabetes mellitus without complications: Secondary | ICD-10-CM | POA: Diagnosis not present

## 2016-02-11 DIAGNOSIS — B028 Zoster with other complications: Secondary | ICD-10-CM | POA: Diagnosis not present

## 2016-02-11 DIAGNOSIS — M5417 Radiculopathy, lumbosacral region: Secondary | ICD-10-CM | POA: Diagnosis not present

## 2016-02-11 DIAGNOSIS — J45909 Unspecified asthma, uncomplicated: Secondary | ICD-10-CM | POA: Diagnosis not present

## 2016-02-11 DIAGNOSIS — M5440 Lumbago with sciatica, unspecified side: Secondary | ICD-10-CM | POA: Diagnosis not present

## 2016-02-18 ENCOUNTER — Ambulatory Visit (INDEPENDENT_AMBULATORY_CARE_PROVIDER_SITE_OTHER): Payer: Medicare Other | Admitting: Sports Medicine

## 2016-02-18 ENCOUNTER — Other Ambulatory Visit: Payer: Self-pay | Admitting: Family Medicine

## 2016-02-18 ENCOUNTER — Encounter: Payer: Self-pay | Admitting: Sports Medicine

## 2016-02-18 VITALS — BP 131/64 | HR 80 | Resp 18 | Wt 166.5 lb

## 2016-02-18 DIAGNOSIS — J449 Chronic obstructive pulmonary disease, unspecified: Secondary | ICD-10-CM | POA: Diagnosis not present

## 2016-02-18 DIAGNOSIS — S5011XD Contusion of right forearm, subsequent encounter: Secondary | ICD-10-CM | POA: Diagnosis not present

## 2016-02-18 DIAGNOSIS — M5440 Lumbago with sciatica, unspecified side: Secondary | ICD-10-CM | POA: Diagnosis not present

## 2016-02-18 DIAGNOSIS — E119 Type 2 diabetes mellitus without complications: Secondary | ICD-10-CM | POA: Diagnosis not present

## 2016-02-18 DIAGNOSIS — S5010XA Contusion of unspecified forearm, initial encounter: Secondary | ICD-10-CM | POA: Insufficient documentation

## 2016-02-18 DIAGNOSIS — B028 Zoster with other complications: Secondary | ICD-10-CM | POA: Diagnosis not present

## 2016-02-18 DIAGNOSIS — M5417 Radiculopathy, lumbosacral region: Secondary | ICD-10-CM | POA: Diagnosis not present

## 2016-02-18 DIAGNOSIS — J45909 Unspecified asthma, uncomplicated: Secondary | ICD-10-CM | POA: Diagnosis not present

## 2016-02-18 NOTE — Progress Notes (Signed)
  Subjective:    CC:  Follow-up  HPI:  2 weeks ago I injected this pleasant 80- year old females left forearm hematoma, she had a good response, but still has some persistence of the pain was nodule.  Past medical history, Surgical history, Family history not pertinant except as noted below, Social history, Allergies, and medications have been entered into the medical record, reviewed, and no changes needed.   Review of Systems: No fevers, chills, night sweats, weight loss, chest pain, or shortness of breath.   Objective:    General: Well Developed, well nourished, and in no acute distress.  Neuro: Alert and oriented x3, extra-ocular muscles intact, sensation grossly intact.  HEENT: Normocephalic, atraumatic, pupils equal round reactive to light, neck supple, no masses, no lymphadenopathy, thyroid nonpalpable.  Skin: Warm and dry, no rashes. Cardiac: Regular rate and rhythm, no murmurs rubs or gallops, no lower extremity edema.  Respiratory: Clear to auscultation bilaterally. Not using accessory muscles, speaking in full sentences. Right forearm: 1 cm painless nodule present.  Impression and Recommendations:

## 2016-02-18 NOTE — Assessment & Plan Note (Signed)
Improved significantly after injection, still present but really nontender. Advised continued massage and watchful waiting.

## 2016-02-20 DIAGNOSIS — M5440 Lumbago with sciatica, unspecified side: Secondary | ICD-10-CM | POA: Diagnosis not present

## 2016-02-20 DIAGNOSIS — B028 Zoster with other complications: Secondary | ICD-10-CM | POA: Diagnosis not present

## 2016-02-20 DIAGNOSIS — J45909 Unspecified asthma, uncomplicated: Secondary | ICD-10-CM | POA: Diagnosis not present

## 2016-02-20 DIAGNOSIS — E119 Type 2 diabetes mellitus without complications: Secondary | ICD-10-CM | POA: Diagnosis not present

## 2016-02-20 DIAGNOSIS — M5417 Radiculopathy, lumbosacral region: Secondary | ICD-10-CM | POA: Diagnosis not present

## 2016-02-20 DIAGNOSIS — J449 Chronic obstructive pulmonary disease, unspecified: Secondary | ICD-10-CM | POA: Diagnosis not present

## 2016-02-24 DIAGNOSIS — J45909 Unspecified asthma, uncomplicated: Secondary | ICD-10-CM | POA: Diagnosis not present

## 2016-02-24 DIAGNOSIS — B028 Zoster with other complications: Secondary | ICD-10-CM | POA: Diagnosis not present

## 2016-02-24 DIAGNOSIS — J449 Chronic obstructive pulmonary disease, unspecified: Secondary | ICD-10-CM | POA: Diagnosis not present

## 2016-02-24 DIAGNOSIS — E119 Type 2 diabetes mellitus without complications: Secondary | ICD-10-CM | POA: Diagnosis not present

## 2016-02-24 DIAGNOSIS — M5417 Radiculopathy, lumbosacral region: Secondary | ICD-10-CM | POA: Diagnosis not present

## 2016-02-24 DIAGNOSIS — M5440 Lumbago with sciatica, unspecified side: Secondary | ICD-10-CM | POA: Diagnosis not present

## 2016-02-26 DIAGNOSIS — J449 Chronic obstructive pulmonary disease, unspecified: Secondary | ICD-10-CM | POA: Diagnosis not present

## 2016-02-26 DIAGNOSIS — J45909 Unspecified asthma, uncomplicated: Secondary | ICD-10-CM | POA: Diagnosis not present

## 2016-02-26 DIAGNOSIS — M5440 Lumbago with sciatica, unspecified side: Secondary | ICD-10-CM | POA: Diagnosis not present

## 2016-02-26 DIAGNOSIS — B028 Zoster with other complications: Secondary | ICD-10-CM | POA: Diagnosis not present

## 2016-02-26 DIAGNOSIS — E119 Type 2 diabetes mellitus without complications: Secondary | ICD-10-CM | POA: Diagnosis not present

## 2016-02-26 DIAGNOSIS — M5417 Radiculopathy, lumbosacral region: Secondary | ICD-10-CM | POA: Diagnosis not present

## 2016-02-29 ENCOUNTER — Other Ambulatory Visit: Payer: Self-pay | Admitting: Family Medicine

## 2016-03-02 ENCOUNTER — Other Ambulatory Visit: Payer: Self-pay | Admitting: Family Medicine

## 2016-03-02 DIAGNOSIS — M5440 Lumbago with sciatica, unspecified side: Secondary | ICD-10-CM | POA: Diagnosis not present

## 2016-03-02 DIAGNOSIS — B028 Zoster with other complications: Secondary | ICD-10-CM | POA: Diagnosis not present

## 2016-03-02 DIAGNOSIS — M5417 Radiculopathy, lumbosacral region: Secondary | ICD-10-CM | POA: Diagnosis not present

## 2016-03-02 DIAGNOSIS — J45909 Unspecified asthma, uncomplicated: Secondary | ICD-10-CM | POA: Diagnosis not present

## 2016-03-02 DIAGNOSIS — J449 Chronic obstructive pulmonary disease, unspecified: Secondary | ICD-10-CM | POA: Diagnosis not present

## 2016-03-02 DIAGNOSIS — E119 Type 2 diabetes mellitus without complications: Secondary | ICD-10-CM | POA: Diagnosis not present

## 2016-03-04 DIAGNOSIS — M5440 Lumbago with sciatica, unspecified side: Secondary | ICD-10-CM | POA: Diagnosis not present

## 2016-03-04 DIAGNOSIS — E119 Type 2 diabetes mellitus without complications: Secondary | ICD-10-CM | POA: Diagnosis not present

## 2016-03-04 DIAGNOSIS — J449 Chronic obstructive pulmonary disease, unspecified: Secondary | ICD-10-CM | POA: Diagnosis not present

## 2016-03-04 DIAGNOSIS — M5417 Radiculopathy, lumbosacral region: Secondary | ICD-10-CM | POA: Diagnosis not present

## 2016-03-04 DIAGNOSIS — J45909 Unspecified asthma, uncomplicated: Secondary | ICD-10-CM | POA: Diagnosis not present

## 2016-03-04 DIAGNOSIS — B028 Zoster with other complications: Secondary | ICD-10-CM | POA: Diagnosis not present

## 2016-03-05 ENCOUNTER — Other Ambulatory Visit: Payer: Self-pay | Admitting: Family Medicine

## 2016-03-09 DIAGNOSIS — E119 Type 2 diabetes mellitus without complications: Secondary | ICD-10-CM | POA: Diagnosis not present

## 2016-03-09 DIAGNOSIS — B028 Zoster with other complications: Secondary | ICD-10-CM | POA: Diagnosis not present

## 2016-03-09 DIAGNOSIS — M5417 Radiculopathy, lumbosacral region: Secondary | ICD-10-CM | POA: Diagnosis not present

## 2016-03-09 DIAGNOSIS — J449 Chronic obstructive pulmonary disease, unspecified: Secondary | ICD-10-CM | POA: Diagnosis not present

## 2016-03-09 DIAGNOSIS — I4891 Unspecified atrial fibrillation: Secondary | ICD-10-CM | POA: Diagnosis not present

## 2016-03-09 DIAGNOSIS — Z794 Long term (current) use of insulin: Secondary | ICD-10-CM | POA: Diagnosis not present

## 2016-03-09 DIAGNOSIS — J45909 Unspecified asthma, uncomplicated: Secondary | ICD-10-CM | POA: Diagnosis not present

## 2016-03-09 DIAGNOSIS — Z9181 History of falling: Secondary | ICD-10-CM | POA: Diagnosis not present

## 2016-03-09 DIAGNOSIS — M5440 Lumbago with sciatica, unspecified side: Secondary | ICD-10-CM | POA: Diagnosis not present

## 2016-03-09 NOTE — Progress Notes (Signed)
HPI: FU coronary disease, cerebrovascular disease and renal artery stenosis. Previously resided in [place]. Patient underwent coronary artery bypassing graft in 2004. Records unavailable. She has had stents placed since then but none since 2006. Had cath at [facility] 7/15 but no records available. VQ scan September 2015 low probability. Renal Dopplers in September 2015 showed greater than 60% left renal artery stenosis. There was an anechoic density in the right kidney. Dedicated renal ultrasound revealed 2.5 cm cyst in the upper pole of the right kidney. Carotid Dopplers March 2016 at [facility] showed no stenosis. Echocardiogram January 2016 showed ejection fraction 50-55%, moderate left atrial enlargement and mild mitral regurgitation. Nuclear study April 2016 was normal. Since last seen, She denies dyspnea, chest pain, palpitations, syncope or bleeding.  Current Outpatient Prescriptions  Medication Sig Dispense Refill  . AMBULATORY NON FORMULARY MEDICATION Diabetic Test Strips and Lancets: One Touch Ultra.   Dx Type 2 Diabetes.e11.21  Use to check blood sugar twice a day. 100 Units 3  . AMBULATORY NON FORMULARY MEDICATION Insulin needles for lantus solastar  31g x4 100 each 11  . atorvastatin (LIPITOR) 40 MG tablet TAKE 1 TABLET(40 MG) BY MOUTH DAILY AT 6 PM 30 tablet 0  . Calcium Carbonate-Vitamin D (CALTRATE 600+D PO) Take by mouth 2 (two) times daily.    . carvedilol (COREG) 6.25 MG tablet TAKE 1 TABLET BY MOUTH TWICE DAILY WITH MEALS 60 tablet 0  . ELIQUIS 5 MG TABS tablet TAKE 1 TABLET(5 MG) BY MOUTH TWICE DAILY 60 tablet 0  . escitalopram (LEXAPRO) 5 MG tablet Take 1 tablet (5 mg total) by mouth daily. 90 tablet 0  . FeFum-FePoly-FA-B Cmp-C-Biot (INTEGRA PLUS) CAPS TAKE 1 CAPSULE BY MOUTH DAILY 30 capsule 0  . gabapentin (NEURONTIN) 300 MG capsule One by mouth up to three times a day as tolerated. 90 capsule 3  . Insulin Glargine (TOUJEO SOLOSTAR) 300 UNIT/ML SOPN Inject 38 Units into  the skin daily. 4.5 mL 2  . IRON PO Take by mouth.    . levothyroxine (SYNTHROID, LEVOTHROID) 88 MCG tablet TAKE 1 TABLET BY MOUTH DAILY BEFORE BREAKFAST 90 tablet 0  . lisinopril (PRINIVIL,ZESTRIL) 10 MG tablet TAKE 1 TABLET BY MOUTH ONCE DAILY 30 tablet 0  . montelukast (SINGULAIR) 10 MG tablet TAKE 1 TABLET BY MOUTH AT BEDTIME 30 tablet 0  . Multiple Vitamin (MULTIVITAMIN) capsule Take 1 capsule by mouth.    . ONE TOUCH ULTRA TEST test strip CHECK BLOOD GLUCOSE TWICE DAILY 100 each 1  . oxyCODONE-acetaminophen (PERCOCET) 10-325 MG tablet Take 0.5-1 tablets by mouth 2 (two) times daily as needed for pain. 60 tablet 0  . pantoprazole (PROTONIX) 40 MG tablet TAKE 1 TABLET BY MOUTH DAILY 30 tablet 0  . PROVENTIL HFA 108 (90 Base) MCG/ACT inhaler INHALE 2 PUFFS BY MOUTH EVERY 4 TO 6 HOURS AS NEEDED FOR SHORTNESS OF BREATH OR WHEEZING 6.7 g 0  . rivastigmine (EXELON) 1.5 MG capsule Take 1 capsule (1.5 mg total) by mouth 2 (two) times daily. 180 capsule 3  . sitaGLIPtin (JANUVIA) 50 MG tablet Take 1 tablet (50 mg total) by mouth daily. 90 tablet 0  . tolterodine (DETROL LA) 2 MG 24 hr capsule TAKE 1 CAPSULE(2 MG) BY MOUTH DAILY 30 capsule 0   No current facility-administered medications for this visit.     Past Medical History  Diagnosis Date  . Chronic renal insufficiency 02/26/2014  . COPD (chronic obstructive pulmonary disease) ([place]) 02/26/2014  . Hypothyroid 02/26/2014  .  Presence of stent in coronary artery in patient with coronary artery disease 02/26/2014  . Type 2 diabetes mellitus ([place]) 02/26/2014  . Asthma   . CAD (coronary artery disease)   . Renal artery stenosis ([place])   . Atrial flutter ([place])   . Cerebrovascular disease     Past Surgical History  Procedure Laterality Date  . Coronary artery bypass graft      2004, [place]  . Hip replacement    . Knee surgery Bilateral   . Back surgery    . Cholecystectomy    . Tonsillectomy    . Appendectomy    . Abdominal hysterectomy     . Shoulder surgery    . Nasal sinus surgery      Social History   Social History  . Marital Status: Married    Spouse Name: N/A  . Number of Children: 4  . Years of Education: N/A   Occupational History  . Not on file.   Social History Main Topics  . Smoking status: Never Smoker   . Smokeless tobacco: Not on file  . Alcohol Use: No  . Drug Use: No  . Sexual Activity: No   Other Topics Concern  . Not on file   Social History Narrative    Family History  Problem Relation Age of Onset  . CAD Father   . Heart attack Father     ROS: no fevers or chills, productive cough, hemoptysis, dysphasia, odynophagia, melena, hematochezia, dysuria, hematuria, rash, seizure activity, orthopnea, PND, pedal edema, claudication. Remaining systems are negative.  Physical Exam: Well-developed well-nourished in no acute distress.  Skin is warm and dry.  HEENT is normal.  Neck is supple.  Chest is clear to auscultation with normal expansion.  Cardiovascular exam is irregular Abdominal exam nontender or distended. No masses palpated. Extremities show no edema. neuro grossly intact  ECG Atrial fibrillation at a rate of 77. Left anterior fascicular block. Incomplete right bundle branch block. Cannot rule out prior septal infarct.  Assessment and plan 1 Coronary artery disease-continue statin. Not on aspirin given need for anticoagulation. 2 carotid artery disease-Continue statin. 3 atrial fibrillation-Continue apixaban and carvedilol. 4 hyperlipidemia-continue statin. 5 hypertension-blood pressure controlled. Continue present medications. 6 renal artery stenosis-continue statin. 7 preoperative evaluation prior to back injection-hold apixaban 3 days prior to procedure and resume after when hemostasis achieved and okay with surgery.   Kirk Ruths, MD

## 2016-03-10 ENCOUNTER — Other Ambulatory Visit: Payer: Self-pay

## 2016-03-10 DIAGNOSIS — B028 Zoster with other complications: Secondary | ICD-10-CM | POA: Diagnosis not present

## 2016-03-10 DIAGNOSIS — M4310 Spondylolisthesis, site unspecified: Secondary | ICD-10-CM

## 2016-03-10 DIAGNOSIS — J45909 Unspecified asthma, uncomplicated: Secondary | ICD-10-CM | POA: Diagnosis not present

## 2016-03-10 DIAGNOSIS — M5440 Lumbago with sciatica, unspecified side: Secondary | ICD-10-CM | POA: Diagnosis not present

## 2016-03-10 DIAGNOSIS — J449 Chronic obstructive pulmonary disease, unspecified: Secondary | ICD-10-CM | POA: Diagnosis not present

## 2016-03-10 DIAGNOSIS — M5417 Radiculopathy, lumbosacral region: Secondary | ICD-10-CM | POA: Diagnosis not present

## 2016-03-10 DIAGNOSIS — E119 Type 2 diabetes mellitus without complications: Secondary | ICD-10-CM | POA: Diagnosis not present

## 2016-03-10 MED ORDER — OXYCODONE-ACETAMINOPHEN 10-325 MG PO TABS: 0.5000 | ORAL_TABLET | Freq: Two times a day (BID) | ORAL | Status: DC | PRN

## 2016-03-11 DIAGNOSIS — E119 Type 2 diabetes mellitus without complications: Secondary | ICD-10-CM | POA: Diagnosis not present

## 2016-03-11 DIAGNOSIS — B028 Zoster with other complications: Secondary | ICD-10-CM | POA: Diagnosis not present

## 2016-03-11 DIAGNOSIS — M5440 Lumbago with sciatica, unspecified side: Secondary | ICD-10-CM | POA: Diagnosis not present

## 2016-03-11 DIAGNOSIS — M5417 Radiculopathy, lumbosacral region: Secondary | ICD-10-CM | POA: Diagnosis not present

## 2016-03-11 DIAGNOSIS — J449 Chronic obstructive pulmonary disease, unspecified: Secondary | ICD-10-CM | POA: Diagnosis not present

## 2016-03-11 DIAGNOSIS — J45909 Unspecified asthma, uncomplicated: Secondary | ICD-10-CM | POA: Diagnosis not present

## 2016-03-15 ENCOUNTER — Other Ambulatory Visit: Payer: Self-pay | Admitting: Family Medicine

## 2016-03-17 ENCOUNTER — Ambulatory Visit (INDEPENDENT_AMBULATORY_CARE_PROVIDER_SITE_OTHER): Payer: Medicare Other | Admitting: Cardiology

## 2016-03-17 ENCOUNTER — Encounter: Payer: Self-pay | Admitting: Cardiology

## 2016-03-17 VITALS — BP 145/73 | HR 77 | Ht 67.0 in | Wt 160.4 lb

## 2016-03-17 DIAGNOSIS — M5440 Lumbago with sciatica, unspecified side: Secondary | ICD-10-CM | POA: Diagnosis not present

## 2016-03-17 DIAGNOSIS — I10 Essential (primary) hypertension: Secondary | ICD-10-CM | POA: Diagnosis not present

## 2016-03-17 DIAGNOSIS — B028 Zoster with other complications: Secondary | ICD-10-CM | POA: Diagnosis not present

## 2016-03-17 DIAGNOSIS — I2510 Atherosclerotic heart disease of native coronary artery without angina pectoris: Secondary | ICD-10-CM

## 2016-03-17 DIAGNOSIS — M5417 Radiculopathy, lumbosacral region: Secondary | ICD-10-CM | POA: Diagnosis not present

## 2016-03-17 DIAGNOSIS — I4892 Unspecified atrial flutter: Secondary | ICD-10-CM

## 2016-03-17 DIAGNOSIS — J449 Chronic obstructive pulmonary disease, unspecified: Secondary | ICD-10-CM | POA: Diagnosis not present

## 2016-03-17 DIAGNOSIS — I480 Paroxysmal atrial fibrillation: Secondary | ICD-10-CM | POA: Diagnosis not present

## 2016-03-17 DIAGNOSIS — E119 Type 2 diabetes mellitus without complications: Secondary | ICD-10-CM | POA: Diagnosis not present

## 2016-03-17 DIAGNOSIS — J45909 Unspecified asthma, uncomplicated: Secondary | ICD-10-CM | POA: Diagnosis not present

## 2016-03-17 NOTE — Patient Instructions (Signed)
Your physician wants you to follow-up in: 6 MONTHS WITH DR CRENSHAW You will receive a reminder letter in the mail two months in advance. If you don't receive a letter, please call our office to schedule the follow-up appointment.  

## 2016-03-24 DIAGNOSIS — J45909 Unspecified asthma, uncomplicated: Secondary | ICD-10-CM | POA: Diagnosis not present

## 2016-03-24 DIAGNOSIS — E119 Type 2 diabetes mellitus without complications: Secondary | ICD-10-CM | POA: Diagnosis not present

## 2016-03-24 DIAGNOSIS — M5440 Lumbago with sciatica, unspecified side: Secondary | ICD-10-CM | POA: Diagnosis not present

## 2016-03-24 DIAGNOSIS — M5417 Radiculopathy, lumbosacral region: Secondary | ICD-10-CM | POA: Diagnosis not present

## 2016-03-24 DIAGNOSIS — J449 Chronic obstructive pulmonary disease, unspecified: Secondary | ICD-10-CM | POA: Diagnosis not present

## 2016-03-24 DIAGNOSIS — B028 Zoster with other complications: Secondary | ICD-10-CM | POA: Diagnosis not present

## 2016-03-26 DIAGNOSIS — M5417 Radiculopathy, lumbosacral region: Secondary | ICD-10-CM | POA: Diagnosis not present

## 2016-03-26 DIAGNOSIS — J449 Chronic obstructive pulmonary disease, unspecified: Secondary | ICD-10-CM | POA: Diagnosis not present

## 2016-03-26 DIAGNOSIS — M5440 Lumbago with sciatica, unspecified side: Secondary | ICD-10-CM | POA: Diagnosis not present

## 2016-03-26 DIAGNOSIS — J45909 Unspecified asthma, uncomplicated: Secondary | ICD-10-CM | POA: Diagnosis not present

## 2016-03-26 DIAGNOSIS — E119 Type 2 diabetes mellitus without complications: Secondary | ICD-10-CM | POA: Diagnosis not present

## 2016-03-26 DIAGNOSIS — B028 Zoster with other complications: Secondary | ICD-10-CM | POA: Diagnosis not present

## 2016-03-29 ENCOUNTER — Other Ambulatory Visit: Payer: Self-pay | Admitting: Family Medicine

## 2016-03-29 DIAGNOSIS — M5417 Radiculopathy, lumbosacral region: Secondary | ICD-10-CM | POA: Diagnosis not present

## 2016-03-29 DIAGNOSIS — B028 Zoster with other complications: Secondary | ICD-10-CM | POA: Diagnosis not present

## 2016-03-29 DIAGNOSIS — J449 Chronic obstructive pulmonary disease, unspecified: Secondary | ICD-10-CM | POA: Diagnosis not present

## 2016-03-29 DIAGNOSIS — M5440 Lumbago with sciatica, unspecified side: Secondary | ICD-10-CM | POA: Diagnosis not present

## 2016-03-29 DIAGNOSIS — E119 Type 2 diabetes mellitus without complications: Secondary | ICD-10-CM | POA: Diagnosis not present

## 2016-03-29 DIAGNOSIS — J45909 Unspecified asthma, uncomplicated: Secondary | ICD-10-CM | POA: Diagnosis not present

## 2016-03-31 ENCOUNTER — Other Ambulatory Visit: Payer: Medicare Other

## 2016-04-02 DIAGNOSIS — J45909 Unspecified asthma, uncomplicated: Secondary | ICD-10-CM | POA: Diagnosis not present

## 2016-04-02 DIAGNOSIS — M5417 Radiculopathy, lumbosacral region: Secondary | ICD-10-CM | POA: Diagnosis not present

## 2016-04-02 DIAGNOSIS — E119 Type 2 diabetes mellitus without complications: Secondary | ICD-10-CM | POA: Diagnosis not present

## 2016-04-02 DIAGNOSIS — B028 Zoster with other complications: Secondary | ICD-10-CM | POA: Diagnosis not present

## 2016-04-02 DIAGNOSIS — J449 Chronic obstructive pulmonary disease, unspecified: Secondary | ICD-10-CM | POA: Diagnosis not present

## 2016-04-02 DIAGNOSIS — M5440 Lumbago with sciatica, unspecified side: Secondary | ICD-10-CM | POA: Diagnosis not present

## 2016-04-06 DIAGNOSIS — B028 Zoster with other complications: Secondary | ICD-10-CM | POA: Diagnosis not present

## 2016-04-06 DIAGNOSIS — J449 Chronic obstructive pulmonary disease, unspecified: Secondary | ICD-10-CM | POA: Diagnosis not present

## 2016-04-06 DIAGNOSIS — E119 Type 2 diabetes mellitus without complications: Secondary | ICD-10-CM | POA: Diagnosis not present

## 2016-04-06 DIAGNOSIS — M5440 Lumbago with sciatica, unspecified side: Secondary | ICD-10-CM | POA: Diagnosis not present

## 2016-04-06 DIAGNOSIS — J45909 Unspecified asthma, uncomplicated: Secondary | ICD-10-CM | POA: Diagnosis not present

## 2016-04-06 DIAGNOSIS — M5417 Radiculopathy, lumbosacral region: Secondary | ICD-10-CM | POA: Diagnosis not present

## 2016-04-08 DIAGNOSIS — J449 Chronic obstructive pulmonary disease, unspecified: Secondary | ICD-10-CM | POA: Diagnosis not present

## 2016-04-08 DIAGNOSIS — M5417 Radiculopathy, lumbosacral region: Secondary | ICD-10-CM | POA: Diagnosis not present

## 2016-04-08 DIAGNOSIS — M5440 Lumbago with sciatica, unspecified side: Secondary | ICD-10-CM | POA: Diagnosis not present

## 2016-04-08 DIAGNOSIS — J45909 Unspecified asthma, uncomplicated: Secondary | ICD-10-CM | POA: Diagnosis not present

## 2016-04-08 DIAGNOSIS — B028 Zoster with other complications: Secondary | ICD-10-CM | POA: Diagnosis not present

## 2016-04-08 DIAGNOSIS — E119 Type 2 diabetes mellitus without complications: Secondary | ICD-10-CM | POA: Diagnosis not present

## 2016-04-12 DIAGNOSIS — J45909 Unspecified asthma, uncomplicated: Secondary | ICD-10-CM | POA: Diagnosis not present

## 2016-04-12 DIAGNOSIS — E119 Type 2 diabetes mellitus without complications: Secondary | ICD-10-CM | POA: Diagnosis not present

## 2016-04-12 DIAGNOSIS — M5440 Lumbago with sciatica, unspecified side: Secondary | ICD-10-CM | POA: Diagnosis not present

## 2016-04-12 DIAGNOSIS — J449 Chronic obstructive pulmonary disease, unspecified: Secondary | ICD-10-CM | POA: Diagnosis not present

## 2016-04-12 DIAGNOSIS — B028 Zoster with other complications: Secondary | ICD-10-CM | POA: Diagnosis not present

## 2016-04-12 DIAGNOSIS — M5417 Radiculopathy, lumbosacral region: Secondary | ICD-10-CM | POA: Diagnosis not present

## 2016-04-14 DIAGNOSIS — J45909 Unspecified asthma, uncomplicated: Secondary | ICD-10-CM | POA: Diagnosis not present

## 2016-04-14 DIAGNOSIS — E119 Type 2 diabetes mellitus without complications: Secondary | ICD-10-CM | POA: Diagnosis not present

## 2016-04-14 DIAGNOSIS — B028 Zoster with other complications: Secondary | ICD-10-CM | POA: Diagnosis not present

## 2016-04-14 DIAGNOSIS — M5417 Radiculopathy, lumbosacral region: Secondary | ICD-10-CM | POA: Diagnosis not present

## 2016-04-14 DIAGNOSIS — M5440 Lumbago with sciatica, unspecified side: Secondary | ICD-10-CM | POA: Diagnosis not present

## 2016-04-14 DIAGNOSIS — J449 Chronic obstructive pulmonary disease, unspecified: Secondary | ICD-10-CM | POA: Diagnosis not present

## 2016-04-15 ENCOUNTER — Ambulatory Visit (INDEPENDENT_AMBULATORY_CARE_PROVIDER_SITE_OTHER): Payer: Medicare Other

## 2016-04-15 ENCOUNTER — Encounter: Payer: Self-pay | Admitting: Family Medicine

## 2016-04-15 ENCOUNTER — Ambulatory Visit (INDEPENDENT_AMBULATORY_CARE_PROVIDER_SITE_OTHER): Payer: Medicare Other | Admitting: Family Medicine

## 2016-04-15 VITALS — BP 133/60 | HR 80 | Wt 161.0 lb

## 2016-04-15 DIAGNOSIS — F0390 Unspecified dementia without behavioral disturbance: Secondary | ICD-10-CM

## 2016-04-15 DIAGNOSIS — Z9660 Presence of unspecified orthopedic joint implant: Secondary | ICD-10-CM

## 2016-04-15 DIAGNOSIS — I2510 Atherosclerotic heart disease of native coronary artery without angina pectoris: Secondary | ICD-10-CM | POA: Diagnosis not present

## 2016-04-15 DIAGNOSIS — M25552 Pain in left hip: Secondary | ICD-10-CM

## 2016-04-15 DIAGNOSIS — M25852 Other specified joint disorders, left hip: Secondary | ICD-10-CM

## 2016-04-15 DIAGNOSIS — S79912A Unspecified injury of left hip, initial encounter: Secondary | ICD-10-CM | POA: Diagnosis not present

## 2016-04-15 DIAGNOSIS — Z96642 Presence of left artificial hip joint: Secondary | ICD-10-CM

## 2016-04-15 NOTE — Progress Notes (Signed)
Briana Werner is a 80 y.o. female who presents to [facility] today for left hip pain. Patient notes worsening left anterior hip pain. She denies any recent injury. She notes that she has a home physical therapist is concerned she may have hip arthritis or avascular necrosis. She denies any history of hip surgery.She Notes that the pain is located in the pelvis and radiates to the anterior thigh is worse with palpation. Additionally she notes significant back pain.  She was evaluated for this problem in the spring. An epidural steroid injection was ordered in May however she just scheduled it recently as she was dealing with her husband who is now in a nursing home.  Patient cannot recall ever having a hip replacement. She's not quite sure when this happened or where it happened or how long ago it was.   Past Medical History:  Diagnosis Date  . Asthma   . Atrial flutter ([place])   . CAD (coronary artery disease)   . Cerebrovascular disease   . Chronic renal insufficiency 02/26/2014  . COPD (chronic obstructive pulmonary disease) ([place]) 02/26/2014  . Hypothyroid 02/26/2014  . Presence of stent in coronary artery in patient with coronary artery disease 02/26/2014  . Renal artery stenosis ([place])   . Type 2 diabetes mellitus ([place]) 02/26/2014   Past Surgical History:  Procedure Laterality Date  . ABDOMINAL HYSTERECTOMY    . APPENDECTOMY    . BACK SURGERY    . CHOLECYSTECTOMY    . CORONARY ARTERY BYPASS GRAFT     2004, [place]  . Hip replacement    . KNEE SURGERY Bilateral   . NASAL SINUS SURGERY    . SHOULDER SURGERY    . TONSILLECTOMY     Social History  Substance Use Topics  . Smoking status: Never Smoker  . Smokeless tobacco: Not on file  . Alcohol use No   family history includes CAD in her father; Heart attack in her father.  ROS as above:  Medications: Current  Outpatient Prescriptions  Medication Sig Dispense Refill  . AMBULATORY NON FORMULARY MEDICATION Diabetic Test Strips and Lancets: One Touch Ultra.   Dx Type 2 Diabetes.e11.21  Use to check blood sugar twice a day. 100 Units 3  . AMBULATORY NON FORMULARY MEDICATION Insulin needles for lantus solastar  31g x4 100 each 11  . atorvastatin (LIPITOR) 40 MG tablet TAKE 1 TABLET(40 MG) BY MOUTH DAILY AT 6 PM 30 tablet 1  . Calcium Carbonate-Vitamin D (CALTRATE 600+D PO) Take by mouth 2 (two) times daily.    . carvedilol (COREG) 6.25 MG tablet TAKE 1 TABLET BY MOUTH TWICE DAILY WITH MEALS 60 tablet 1  . ELIQUIS 5 MG TABS tablet TAKE 1 TABLET(5 MG) BY MOUTH TWICE DAILY 60 tablet 0  . escitalopram (LEXAPRO) 5 MG tablet Take 1 tablet (5 mg total) by mouth daily. 90 tablet 0  . FeFum-FePoly-FA-B Cmp-C-Biot (INTEGRA PLUS) CAPS TAKE 1 CAPSULE BY MOUTH DAILY 30 capsule 0  . gabapentin (NEURONTIN) 300 MG capsule One by mouth up to three times a day as tolerated. 90 capsule 3  . Insulin Glargine (TOUJEO SOLOSTAR) 300 UNIT/ML SOPN Inject 38 Units into the skin daily. 4.5 mL 2  . IRON PO Take by mouth.    . levothyroxine (SYNTHROID, LEVOTHROID) 88 MCG tablet TAKE 1 TABLET BY MOUTH DAILY BEFORE BREAKFAST 90 tablet 0  . lisinopril (PRINIVIL,ZESTRIL) 10 MG tablet TAKE 1 TABLET BY MOUTH  ONCE DAILY 30 tablet 1  . montelukast (SINGULAIR) 10 MG tablet TAKE 1 TABLET BY MOUTH AT BEDTIME 30 tablet 1  . Multiple Vitamin (MULTIVITAMIN) capsule Take 1 capsule by mouth.    . ONE TOUCH ULTRA TEST test strip CHECK BLOOD GLUCOSE TWICE DAILY 100 each 1  . oxyCODONE-acetaminophen (PERCOCET) 10-325 MG tablet Take 0.5-1 tablets by mouth 2 (two) times daily as needed for pain. 60 tablet 0  . pantoprazole (PROTONIX) 40 MG tablet TAKE 1 TABLET BY MOUTH DAILY 30 tablet 1  . PROVENTIL HFA 108 (90 Base) MCG/ACT inhaler INHALE 2 PUFFS BY MOUTH EVERY 4 TO 6 HOURS AS NEEDED FOR SHORTNESS OF BREATH OR WHEEZING 6.7 g 0  . rivastigmine (EXELON)  1.5 MG capsule Take 1 capsule (1.5 mg total) by mouth 2 (two) times daily. 180 capsule 3  . sitaGLIPtin (JANUVIA) 50 MG tablet Take 1 tablet (50 mg total) by mouth daily. 90 tablet 0  . tolterodine (DETROL LA) 2 MG 24 hr capsule TAKE 1 CAPSULE(2 MG) BY MOUTH DAILY 30 capsule 0   No current facility-administered medications for this visit.    Allergies  Allergen Reactions  . Tetracycline Shortness Of Breath  . Tetracyclines & Related Anaphylaxis and Shortness Of Breath    dizziness  . Other Dermatitis    Bandaids   . Amlodipine     Edema  . Amoxicillin     Questionable rash  . Bactrim [Sulfamethoxazole-Trimethoprim] Other (See Comments)    dizzy dizzy  . Latex Dermatitis    Bandages, pulls skin  . Tape Dermatitis    Bandaids  Bandaids      Exam:  BP 133/60   Pulse 80   Wt 161 lb (73 kg)   BMI 25.22 kg/m  Gen: Well NAD  Exts: Brisk capillary refill, warm and well perfused.  Left Hip: Decreased motion with pain. No clunking. Patient uses a walker to ambulate Neuropsych: Alert. Patient seems confused and cannot recall significant medical history. Normal speech.  No results found for this or any previous visit (from the past 24 hour(s)). Dg Hip Unilat With Pelvis 2-3 Views Left  Result Date: 04/15/2016 CLINICAL DATA:  Left hip pain following a fall 1 week ago. EXAM: DG HIP (WITH OR WITHOUT PELVIS) 2-3V LEFT COMPARISON:  01/01/2016. FINDINGS: Stable left hip prosthesis. No fracture or dislocation seen. Atheromatous arterial calcifications. Vascular clips overlying the lateral mid right pelvis. Diffuse osteopenia. Prominent stool in the colon. IMPRESSION: 1. No fracture or dislocation. 2. Prominent stool in the colon. 3. Atheromatous arterial calcifications and right vascular clips. Electronically Signed   By: Claudie Revering M.D.   On: 04/15/2016 14:46      Assessment and Plan: 80 y.o. female with  Left hip pain. Patient has new onset of left hip pain after a hip replacement.  The x-ray is pretty unremarkable. I'm concerned about loosening prosthesis. Plan for three-phase bone scan. Follow-up after bone scan  Back pain: Epidural steroid injection pending. Follow-up after back injection.  Diffusion: Concerning for worsening pre-existing dementia. Follow-up with PCP for mentioned testing.   Orders Placed This Encounter  Procedures  . DG HIP UNILAT WITH PELVIS 2-3 VIEWS LEFT    Standing Status:   Future    Number of Occurrences:   1    Standing Expiration Date:   06/15/2017    Order Specific Question:   Reason for Exam (SYMPTOM  OR DIAGNOSIS REQUIRED)    Answer:   eval possible fx    Order  Specific Question:   Preferred imaging location?    Answer:   [facility]    Discussed warning signs or symptoms. Please see discharge instructions. Patient expresses understanding.

## 2016-04-15 NOTE — Patient Instructions (Signed)
Thank you for coming in today. Return following injection and bone scan.

## 2016-04-20 ENCOUNTER — Ambulatory Visit
Admission: RE | Admit: 2016-04-20 | Discharge: 2016-04-20 | Disposition: A | Discharge status: Home / Self Care | Payer: Medicare Other | Source: Ambulatory Visit | Attending: Family Medicine | Admitting: Family Medicine

## 2016-04-20 DIAGNOSIS — M5416 Radiculopathy, lumbar region: Secondary | ICD-10-CM | POA: Diagnosis not present

## 2016-04-20 MED ORDER — METHYLPREDNISOLONE ACETATE 40 MG/ML INJ SUSP (RADIOLOG
120.0000 mg | Freq: Once | INTRAMUSCULAR | Status: AC
  Administered 2016-04-20: 120 mg via EPIDURAL

## 2016-04-20 MED ORDER — IOPAMIDOL (ISOVUE-M 200) INJECTION 41%
1.0000 mL | Freq: Once | INTRAMUSCULAR | Status: AC
  Administered 2016-04-20: 1 mL via EPIDURAL

## 2016-04-20 NOTE — Discharge Instructions (Signed)

## 2016-04-22 DIAGNOSIS — E119 Type 2 diabetes mellitus without complications: Secondary | ICD-10-CM | POA: Diagnosis not present

## 2016-04-22 DIAGNOSIS — B028 Zoster with other complications: Secondary | ICD-10-CM | POA: Diagnosis not present

## 2016-04-22 DIAGNOSIS — M5440 Lumbago with sciatica, unspecified side: Secondary | ICD-10-CM | POA: Diagnosis not present

## 2016-04-22 DIAGNOSIS — J45909 Unspecified asthma, uncomplicated: Secondary | ICD-10-CM | POA: Diagnosis not present

## 2016-04-22 DIAGNOSIS — M5417 Radiculopathy, lumbosacral region: Secondary | ICD-10-CM | POA: Diagnosis not present

## 2016-04-22 DIAGNOSIS — J449 Chronic obstructive pulmonary disease, unspecified: Secondary | ICD-10-CM | POA: Diagnosis not present

## 2016-04-23 ENCOUNTER — Other Ambulatory Visit: Payer: Self-pay | Admitting: Family Medicine

## 2016-04-23 DIAGNOSIS — M5417 Radiculopathy, lumbosacral region: Secondary | ICD-10-CM | POA: Diagnosis not present

## 2016-04-23 DIAGNOSIS — J45909 Unspecified asthma, uncomplicated: Secondary | ICD-10-CM | POA: Diagnosis not present

## 2016-04-23 DIAGNOSIS — B028 Zoster with other complications: Secondary | ICD-10-CM | POA: Diagnosis not present

## 2016-04-23 DIAGNOSIS — M5440 Lumbago with sciatica, unspecified side: Secondary | ICD-10-CM | POA: Diagnosis not present

## 2016-04-23 DIAGNOSIS — J449 Chronic obstructive pulmonary disease, unspecified: Secondary | ICD-10-CM | POA: Diagnosis not present

## 2016-04-23 DIAGNOSIS — E119 Type 2 diabetes mellitus without complications: Secondary | ICD-10-CM | POA: Diagnosis not present

## 2016-04-29 DIAGNOSIS — J449 Chronic obstructive pulmonary disease, unspecified: Secondary | ICD-10-CM | POA: Diagnosis not present

## 2016-04-29 DIAGNOSIS — E119 Type 2 diabetes mellitus without complications: Secondary | ICD-10-CM | POA: Diagnosis not present

## 2016-04-29 DIAGNOSIS — J45909 Unspecified asthma, uncomplicated: Secondary | ICD-10-CM | POA: Diagnosis not present

## 2016-04-29 DIAGNOSIS — M5440 Lumbago with sciatica, unspecified side: Secondary | ICD-10-CM | POA: Diagnosis not present

## 2016-04-29 DIAGNOSIS — M5417 Radiculopathy, lumbosacral region: Secondary | ICD-10-CM | POA: Diagnosis not present

## 2016-04-29 DIAGNOSIS — B028 Zoster with other complications: Secondary | ICD-10-CM | POA: Diagnosis not present

## 2016-05-04 DIAGNOSIS — J45909 Unspecified asthma, uncomplicated: Secondary | ICD-10-CM | POA: Diagnosis not present

## 2016-05-04 DIAGNOSIS — E119 Type 2 diabetes mellitus without complications: Secondary | ICD-10-CM | POA: Diagnosis not present

## 2016-05-04 DIAGNOSIS — B028 Zoster with other complications: Secondary | ICD-10-CM | POA: Diagnosis not present

## 2016-05-04 DIAGNOSIS — M5440 Lumbago with sciatica, unspecified side: Secondary | ICD-10-CM | POA: Diagnosis not present

## 2016-05-04 DIAGNOSIS — J449 Chronic obstructive pulmonary disease, unspecified: Secondary | ICD-10-CM | POA: Diagnosis not present

## 2016-05-04 DIAGNOSIS — M5417 Radiculopathy, lumbosacral region: Secondary | ICD-10-CM | POA: Diagnosis not present

## 2016-05-07 DIAGNOSIS — M5417 Radiculopathy, lumbosacral region: Secondary | ICD-10-CM | POA: Diagnosis not present

## 2016-05-07 DIAGNOSIS — B028 Zoster with other complications: Secondary | ICD-10-CM | POA: Diagnosis not present

## 2016-05-07 DIAGNOSIS — E119 Type 2 diabetes mellitus without complications: Secondary | ICD-10-CM | POA: Diagnosis not present

## 2016-05-07 DIAGNOSIS — J449 Chronic obstructive pulmonary disease, unspecified: Secondary | ICD-10-CM | POA: Diagnosis not present

## 2016-05-07 DIAGNOSIS — M5440 Lumbago with sciatica, unspecified side: Secondary | ICD-10-CM | POA: Diagnosis not present

## 2016-05-07 DIAGNOSIS — J45909 Unspecified asthma, uncomplicated: Secondary | ICD-10-CM | POA: Diagnosis not present

## 2016-05-08 ENCOUNTER — Other Ambulatory Visit: Payer: Self-pay | Admitting: Family Medicine

## 2016-05-08 DIAGNOSIS — M5440 Lumbago with sciatica, unspecified side: Secondary | ICD-10-CM | POA: Diagnosis not present

## 2016-05-08 DIAGNOSIS — Z794 Long term (current) use of insulin: Secondary | ICD-10-CM | POA: Diagnosis not present

## 2016-05-08 DIAGNOSIS — M5417 Radiculopathy, lumbosacral region: Secondary | ICD-10-CM | POA: Diagnosis not present

## 2016-05-08 DIAGNOSIS — I4891 Unspecified atrial fibrillation: Secondary | ICD-10-CM | POA: Diagnosis not present

## 2016-05-08 DIAGNOSIS — Z9181 History of falling: Secondary | ICD-10-CM | POA: Diagnosis not present

## 2016-05-08 DIAGNOSIS — B028 Zoster with other complications: Secondary | ICD-10-CM | POA: Diagnosis not present

## 2016-05-08 DIAGNOSIS — J45909 Unspecified asthma, uncomplicated: Secondary | ICD-10-CM | POA: Diagnosis not present

## 2016-05-08 DIAGNOSIS — J449 Chronic obstructive pulmonary disease, unspecified: Secondary | ICD-10-CM | POA: Diagnosis not present

## 2016-05-08 DIAGNOSIS — E119 Type 2 diabetes mellitus without complications: Secondary | ICD-10-CM | POA: Diagnosis not present

## 2016-05-10 DIAGNOSIS — N183 Chronic kidney disease, stage 3 (moderate): Secondary | ICD-10-CM | POA: Diagnosis not present

## 2016-05-11 DIAGNOSIS — J449 Chronic obstructive pulmonary disease, unspecified: Secondary | ICD-10-CM | POA: Diagnosis not present

## 2016-05-11 DIAGNOSIS — M5417 Radiculopathy, lumbosacral region: Secondary | ICD-10-CM | POA: Diagnosis not present

## 2016-05-11 DIAGNOSIS — B028 Zoster with other complications: Secondary | ICD-10-CM | POA: Diagnosis not present

## 2016-05-11 DIAGNOSIS — E119 Type 2 diabetes mellitus without complications: Secondary | ICD-10-CM | POA: Diagnosis not present

## 2016-05-11 DIAGNOSIS — J45909 Unspecified asthma, uncomplicated: Secondary | ICD-10-CM | POA: Diagnosis not present

## 2016-05-11 DIAGNOSIS — M5440 Lumbago with sciatica, unspecified side: Secondary | ICD-10-CM | POA: Diagnosis not present

## 2016-05-12 ENCOUNTER — Other Ambulatory Visit: Payer: Self-pay

## 2016-05-14 DIAGNOSIS — J449 Chronic obstructive pulmonary disease, unspecified: Secondary | ICD-10-CM | POA: Diagnosis not present

## 2016-05-14 DIAGNOSIS — E119 Type 2 diabetes mellitus without complications: Secondary | ICD-10-CM | POA: Diagnosis not present

## 2016-05-14 DIAGNOSIS — M5440 Lumbago with sciatica, unspecified side: Secondary | ICD-10-CM | POA: Diagnosis not present

## 2016-05-14 DIAGNOSIS — B028 Zoster with other complications: Secondary | ICD-10-CM | POA: Diagnosis not present

## 2016-05-14 DIAGNOSIS — J45909 Unspecified asthma, uncomplicated: Secondary | ICD-10-CM | POA: Diagnosis not present

## 2016-05-14 DIAGNOSIS — M5417 Radiculopathy, lumbosacral region: Secondary | ICD-10-CM | POA: Diagnosis not present

## 2016-05-18 DIAGNOSIS — J45909 Unspecified asthma, uncomplicated: Secondary | ICD-10-CM | POA: Diagnosis not present

## 2016-05-18 DIAGNOSIS — M5417 Radiculopathy, lumbosacral region: Secondary | ICD-10-CM | POA: Diagnosis not present

## 2016-05-18 DIAGNOSIS — B028 Zoster with other complications: Secondary | ICD-10-CM | POA: Diagnosis not present

## 2016-05-18 DIAGNOSIS — J449 Chronic obstructive pulmonary disease, unspecified: Secondary | ICD-10-CM | POA: Diagnosis not present

## 2016-05-18 DIAGNOSIS — M5440 Lumbago with sciatica, unspecified side: Secondary | ICD-10-CM | POA: Diagnosis not present

## 2016-05-18 DIAGNOSIS — E119 Type 2 diabetes mellitus without complications: Secondary | ICD-10-CM | POA: Diagnosis not present

## 2016-05-20 ENCOUNTER — Other Ambulatory Visit: Payer: Self-pay | Admitting: Family Medicine

## 2016-05-20 DIAGNOSIS — E119 Type 2 diabetes mellitus without complications: Secondary | ICD-10-CM | POA: Diagnosis not present

## 2016-05-20 DIAGNOSIS — M5417 Radiculopathy, lumbosacral region: Secondary | ICD-10-CM | POA: Diagnosis not present

## 2016-05-20 DIAGNOSIS — B028 Zoster with other complications: Secondary | ICD-10-CM | POA: Diagnosis not present

## 2016-05-20 DIAGNOSIS — M5440 Lumbago with sciatica, unspecified side: Secondary | ICD-10-CM | POA: Diagnosis not present

## 2016-05-20 DIAGNOSIS — J449 Chronic obstructive pulmonary disease, unspecified: Secondary | ICD-10-CM | POA: Diagnosis not present

## 2016-05-20 DIAGNOSIS — B029 Zoster without complications: Secondary | ICD-10-CM

## 2016-05-20 DIAGNOSIS — J45909 Unspecified asthma, uncomplicated: Secondary | ICD-10-CM | POA: Diagnosis not present

## 2016-05-24 DIAGNOSIS — J45909 Unspecified asthma, uncomplicated: Secondary | ICD-10-CM | POA: Diagnosis not present

## 2016-05-24 DIAGNOSIS — B028 Zoster with other complications: Secondary | ICD-10-CM | POA: Diagnosis not present

## 2016-05-24 DIAGNOSIS — E119 Type 2 diabetes mellitus without complications: Secondary | ICD-10-CM | POA: Diagnosis not present

## 2016-05-24 DIAGNOSIS — J449 Chronic obstructive pulmonary disease, unspecified: Secondary | ICD-10-CM | POA: Diagnosis not present

## 2016-05-24 DIAGNOSIS — M5417 Radiculopathy, lumbosacral region: Secondary | ICD-10-CM | POA: Diagnosis not present

## 2016-05-24 DIAGNOSIS — M5440 Lumbago with sciatica, unspecified side: Secondary | ICD-10-CM | POA: Diagnosis not present

## 2016-05-25 ENCOUNTER — Ambulatory Visit (INDEPENDENT_AMBULATORY_CARE_PROVIDER_SITE_OTHER): Payer: Medicare Other | Admitting: Family Medicine

## 2016-05-25 ENCOUNTER — Encounter: Payer: Self-pay | Admitting: Family Medicine

## 2016-05-25 VITALS — BP 114/56 | HR 85 | Wt 166.0 lb

## 2016-05-25 DIAGNOSIS — Z794 Long term (current) use of insulin: Secondary | ICD-10-CM

## 2016-05-25 DIAGNOSIS — E1121 Type 2 diabetes mellitus with diabetic nephropathy: Secondary | ICD-10-CM

## 2016-05-25 DIAGNOSIS — E1142 Type 2 diabetes mellitus with diabetic polyneuropathy: Secondary | ICD-10-CM

## 2016-05-25 DIAGNOSIS — M25552 Pain in left hip: Secondary | ICD-10-CM | POA: Diagnosis not present

## 2016-05-25 DIAGNOSIS — Z66 Do not resuscitate: Secondary | ICD-10-CM

## 2016-05-25 DIAGNOSIS — L97509 Non-pressure chronic ulcer of other part of unspecified foot with unspecified severity: Secondary | ICD-10-CM

## 2016-05-25 DIAGNOSIS — Z23 Encounter for immunization: Secondary | ICD-10-CM | POA: Diagnosis not present

## 2016-05-25 DIAGNOSIS — E10621 Type 1 diabetes mellitus with foot ulcer: Secondary | ICD-10-CM | POA: Diagnosis not present

## 2016-05-25 DIAGNOSIS — E785 Hyperlipidemia, unspecified: Secondary | ICD-10-CM

## 2016-05-25 DIAGNOSIS — L97529 Non-pressure chronic ulcer of other part of left foot with unspecified severity: Secondary | ICD-10-CM

## 2016-05-25 DIAGNOSIS — N184 Chronic kidney disease, stage 4 (severe): Secondary | ICD-10-CM

## 2016-05-25 DIAGNOSIS — I82409 Acute embolism and thrombosis of unspecified deep veins of unspecified lower extremity: Secondary | ICD-10-CM

## 2016-05-25 DIAGNOSIS — I701 Atherosclerosis of renal artery: Secondary | ICD-10-CM

## 2016-05-25 DIAGNOSIS — I482 Chronic atrial fibrillation, unspecified: Secondary | ICD-10-CM

## 2016-05-25 DIAGNOSIS — N183 Chronic kidney disease, stage 3 unspecified: Secondary | ICD-10-CM

## 2016-05-25 DIAGNOSIS — I4892 Unspecified atrial flutter: Secondary | ICD-10-CM

## 2016-05-25 DIAGNOSIS — B353 Tinea pedis: Secondary | ICD-10-CM | POA: Diagnosis not present

## 2016-05-25 DIAGNOSIS — E11621 Type 2 diabetes mellitus with foot ulcer: Secondary | ICD-10-CM | POA: Insufficient documentation

## 2016-05-25 DIAGNOSIS — Z96642 Presence of left artificial hip joint: Secondary | ICD-10-CM

## 2016-05-25 DIAGNOSIS — I700 Atherosclerosis of aorta: Secondary | ICD-10-CM

## 2016-05-25 LAB — COMPREHENSIVE METABOLIC PANEL
ALK PHOS: 90 U/L (ref 33–130)
ALT: 19 U/L (ref 6–29)
AST: 36 U/L — AB (ref 10–35)
Albumin: 3.9 g/dL (ref 3.6–5.1)
BILIRUBIN TOTAL: 0.8 mg/dL (ref 0.2–1.2)
BUN: 40 mg/dL — AB (ref 7–25)
CO2: 28 mmol/L (ref 20–31)
CREATININE: 1.41 mg/dL — AB (ref 0.60–0.93)
Calcium: 9.5 mg/dL (ref 8.6–10.4)
Chloride: 105 mmol/L (ref 98–110)
GLUCOSE: 86 mg/dL (ref 65–99)
POTASSIUM: 4.7 mmol/L (ref 3.5–5.3)
SODIUM: 141 mmol/L (ref 135–146)
Total Protein: 6.5 g/dL (ref 6.1–8.1)

## 2016-05-25 LAB — CBC
HCT: 34.1 % — ABNORMAL LOW (ref 35.0–45.0)
HEMOGLOBIN: 11.1 g/dL — AB (ref 11.7–15.5)
MCH: 29.6 pg (ref 27.0–33.0)
MCHC: 32.6 g/dL (ref 32.0–36.0)
MCV: 90.9 fL (ref 80.0–100.0)
MPV: 12.9 fL — ABNORMAL HIGH (ref 7.5–12.5)
PLATELETS: 260 10*3/uL (ref 140–400)
RBC: 3.75 MIL/uL — AB (ref 3.80–5.10)
RDW: 15 % (ref 11.0–15.0)
WBC: 8.8 10*3/uL (ref 3.8–10.8)

## 2016-05-25 LAB — POCT GLYCOSYLATED HEMOGLOBIN (HGB A1C): Hemoglobin A1C: 8.1

## 2016-05-25 MED ORDER — TERBINAFINE HCL 250 MG PO TABS: 250.0000 mg | ORAL_TABLET | Freq: Every day | ORAL | 0 refills | Status: DC

## 2016-05-25 MED ORDER — ZOSTER VACCINE LIVE 19400 UNT/0.65ML ~~LOC~~ SUSR: 0.6500 mL | Freq: Once | SUBCUTANEOUS | 0 refills | Status: AC

## 2016-05-25 NOTE — Patient Instructions (Signed)
Thank you for coming in today. Increase insulin to 26 units daily.  Get labs.  You should hear from both podiatry and nuclear medicine soon.  Return in 2 months.

## 2016-05-25 NOTE — Progress Notes (Signed)
Briana Werner is a 79 y.o. female who presents to [facility] today for establish care. Patient's previous primary care provider Dr. Ileene Rubens has left the practice and  Briana Werner would like to establish care with myself.  She has several significant medical problems including diabetes renal artery stenosis COPD atrial flutter and most significantly for her left hip pain. She has a history of left hip prosthesis. She notes continued pain in her left groin. She denies any other significant medical issues that are bothering her. She denies fevers chills nausea vomiting or diarrhea. She has her blood sugar is typically in the 200s in the morning fasting. She denies any low blood sugars. She takes 20 units of long-acting insulin daily.   Past Medical History:  Diagnosis Date  . Asthma   . Atrial flutter ([place])   . CAD (coronary artery disease)   . Cerebrovascular disease   . Chronic renal insufficiency 02/26/2014  . COPD (chronic obstructive pulmonary disease) ([place]) 02/26/2014  . Hypothyroid 02/26/2014  . Presence of stent in coronary artery in patient with coronary artery disease 02/26/2014  . Renal artery stenosis ([place])   . Type 2 diabetes mellitus ([place]) 02/26/2014   Past Surgical History:  Procedure Laterality Date  . ABDOMINAL HYSTERECTOMY    . APPENDECTOMY    . BACK SURGERY    . CHOLECYSTECTOMY    . CORONARY ARTERY BYPASS GRAFT     2004, [place]  . Hip replacement    . KNEE SURGERY Bilateral   . NASAL SINUS SURGERY    . SHOULDER SURGERY    . TONSILLECTOMY     Social History  Substance Use Topics  . Smoking status: Never Smoker  . Smokeless tobacco: Never Used  . Alcohol use No   family history includes CAD in her father; Heart attack in her father.  ROS as above:  Medications: Current Outpatient Prescriptions  Medication Sig Dispense Refill  . AMBULATORY NON  FORMULARY MEDICATION Diabetic Test Strips and Lancets: One Touch Ultra.   Dx Type 2 Diabetes.e11.21  Use to check blood sugar twice a day. 100 Units 3  . AMBULATORY NON FORMULARY MEDICATION Insulin needles for lantus solastar  31g x4 100 each 11  . atorvastatin (LIPITOR) 40 MG tablet TAKE 1 TABLET(40 MG) BY MOUTH DAILY AT 6 PM 30 tablet 0  . Calcium Carbonate-Vitamin D (CALTRATE 600+D PO) Take by mouth 2 (two) times daily.    . carvedilol (COREG) 6.25 MG tablet TAKE 1 TABLET BY MOUTH TWICE DAILY WITH MEALS 60 tablet 0  . ELIQUIS 5 MG TABS tablet TAKE 1 TABLET(5 MG) BY MOUTH TWICE DAILY 60 tablet 0  . escitalopram (LEXAPRO) 5 MG tablet Take 1 tablet (5 mg total) by mouth daily. 90 tablet 0  . FeFum-FePoly-FA-B Cmp-C-Biot (INTEGRA PLUS) CAPS TAKE 1 CAPSULE BY MOUTH DAILY 30 capsule 0  . gabapentin (NEURONTIN) 300 MG capsule TAKE 1 CAPSULE(300 MG) BY MOUTH THREE TIMES DAILY 90 capsule 0  . Insulin Glargine (TOUJEO SOLOSTAR) 300 UNIT/ML SOPN Inject 38 Units into the skin daily. 4.5 mL 2  . IRON PO Take by mouth.    . levothyroxine (SYNTHROID, LEVOTHROID) 88 MCG tablet TAKE 1 TABLET BY MOUTH DAILY BEFORE BREAKFAST 90 tablet 0  . lisinopril (PRINIVIL,ZESTRIL) 10 MG tablet TAKE 1 TABLET BY MOUTH ONCE DAILY 30 tablet 1  . montelukast (SINGULAIR) 10 MG tablet TAKE 1 TABLET BY MOUTH AT BEDTIME 30 tablet 1  .  Multiple Vitamin (MULTIVITAMIN) capsule Take 1 capsule by mouth.    . ONE TOUCH ULTRA TEST test strip CHECK BLOOD GLUCOSE TWICE DAILY 100 each 1  . oxyCODONE-acetaminophen (PERCOCET) 10-325 MG tablet Take 0.5-1 tablets by mouth 2 (two) times daily as needed for pain. 60 tablet 0  . pantoprazole (PROTONIX) 40 MG tablet TAKE 1 TABLET BY MOUTH DAILY 30 tablet 1  . PROVENTIL HFA 108 (90 Base) MCG/ACT inhaler INHALE 2 PUFFS BY MOUTH EVERY 4 TO 6 HOURS AS NEEDED FOR SHORTNESS OF BREATH OR WHEEZING 6.7 g 0  . rivastigmine (EXELON) 1.5 MG capsule Take 1 capsule (1.5 mg total) by mouth 2 (two) times daily.  180 capsule 3  . sitaGLIPtin (JANUVIA) 50 MG tablet Take 1 tablet (50 mg total) by mouth daily. 90 tablet 0  . terbinafine (LAMISIL) 250 MG tablet Take 1 tablet (250 mg total) by mouth daily. 14 tablet 0  . tolterodine (DETROL LA) 2 MG 24 hr capsule TAKE 1 CAPSULE(2 MG) BY MOUTH DAILY 30 capsule 0  . Zoster Vaccine Live, PF, (ZOSTAVAX) 53614 UNT/0.65ML injection Inject 19,400 Units into the skin once. If given in pharmacy fax report to Dr Georgina Snell [phone] 1 each 0   No current facility-administered medications for this visit.    Allergies  Allergen Reactions  . Tetracycline Shortness Of Breath  . Tetracyclines & Related Anaphylaxis and Shortness Of Breath    dizziness  . Other Dermatitis    Bandaids   . Amlodipine     Edema  . Amoxicillin     Questionable rash  . Bactrim [Sulfamethoxazole-Trimethoprim] Other (See Comments)    dizzy dizzy  . Latex Dermatitis    Bandages, pulls skin  . Tape Dermatitis    Bandaids  Bandaids      Exam:  BP (!) 114/56   Pulse 85   Wt 166 lb (75.3 kg)   BMI 26.00 kg/m  Gen: Well NAD HEENT: EOMI,  MMM Lungs: Normal work of breathing. CTABL Heart:  MRG Abd: NABS, Soft. Nondistended, Nontender Exts: Brisk capillary refill, warm and well perfused.  Feet: Feet bilaterally have macular erythematous scaling rash consistent with tinea pedis. Left feet have significant hammertoe deformities of the second and third toes. There is first-degree ulceration at the great toe and third toe due to overlapping toes due to hammertoe deformity. Pulses and capillary refill are intact throughout. Decreased sensation feet bilaterally with monofilament testing.  Results for orders placed or performed in visit on 05/25/16 (from the past 24 hour(s))  POCT HgB A1C     Status: None   Collection Time: 05/25/16 11:10 AM  Result Value Ref Range   Hemoglobin A1C 8.1    No results found.    Assessment and Plan: 80 y.o. female with  Left hip pain: Plan for  three-phase bone scan to evaluate for loosening prosthesis.  Diabetes: Not well controlled. Associated with peripheral neuropathy and kidney disease. Plan to increase long-acting insulin to 26 units daily and recheck in about 2 months.  Foot ulcer and tinea pedis. Refer to podiatry for overlapping foot deformity as well as treating tinea pedis with terbinafine.  Kidney disease: Recheck labs his kidney disease has been worsening slowly. If continued worsening we'll refer to nephrology and discuss goals of care further.  Goals of care: DO NOT RESUSCITATE status. Will bring in home advance directive records.   Vaccines updated.  Orders Placed This Encounter  Procedures  . NM Bone Scan 3 Phase    Standing Status:  Future    Standing Expiration Date:   07/25/2017    Order Specific Question:   Reason for Exam (SYMPTOM  OR DIAGNOSIS REQUIRED)    Answer:   3 phase left hip pain s/p prosthesis.    Order Specific Question:   Preferred imaging location?    Answer:   [facility]  . Tdap vaccine greater than or equal to 7yo IM  . Pneumococcal conjugate vaccine 13-valent IM  . Flu vaccine HIGH DOSE PF  . CBC  . Comprehensive metabolic panel    Order Specific Question:   Has the patient fasted?    Answer:   No  . Ambulatory referral to Podiatry    Referral Priority:   Routine    Referral Type:   Consultation    Referral Reason:   Specialty Services Required    Requested Specialty:   Podiatry    Number of Visits Requested:   1  . POCT HgB A1C    Discussed warning signs or symptoms. Please see discharge instructions. Patient expresses understanding.

## 2016-05-27 ENCOUNTER — Encounter (HOSPITAL_COMMUNITY): Admission: RE | Admit: 2016-05-27 | Payer: Medicare Other | Source: Ambulatory Visit

## 2016-05-27 ENCOUNTER — Encounter (HOSPITAL_COMMUNITY): Payer: Medicare Other | Attending: Family Medicine

## 2016-05-27 DIAGNOSIS — M5417 Radiculopathy, lumbosacral region: Secondary | ICD-10-CM | POA: Diagnosis not present

## 2016-05-27 DIAGNOSIS — J45909 Unspecified asthma, uncomplicated: Secondary | ICD-10-CM | POA: Diagnosis not present

## 2016-05-27 DIAGNOSIS — M5440 Lumbago with sciatica, unspecified side: Secondary | ICD-10-CM | POA: Diagnosis not present

## 2016-05-27 DIAGNOSIS — B028 Zoster with other complications: Secondary | ICD-10-CM | POA: Diagnosis not present

## 2016-05-27 DIAGNOSIS — J449 Chronic obstructive pulmonary disease, unspecified: Secondary | ICD-10-CM | POA: Diagnosis not present

## 2016-05-27 DIAGNOSIS — E119 Type 2 diabetes mellitus without complications: Secondary | ICD-10-CM | POA: Diagnosis not present

## 2016-05-28 ENCOUNTER — Other Ambulatory Visit: Payer: Self-pay | Admitting: Family Medicine

## 2016-05-31 ENCOUNTER — Ambulatory Visit (INDEPENDENT_AMBULATORY_CARE_PROVIDER_SITE_OTHER): Payer: Medicare Other | Admitting: Podiatry

## 2016-05-31 ENCOUNTER — Encounter: Payer: Self-pay | Admitting: Podiatry

## 2016-05-31 VITALS — BP 125/60 | HR 76 | Ht 66.0 in | Wt 166.0 lb

## 2016-05-31 DIAGNOSIS — B353 Tinea pedis: Secondary | ICD-10-CM | POA: Diagnosis not present

## 2016-05-31 DIAGNOSIS — M2042 Other hammer toe(s) (acquired), left foot: Secondary | ICD-10-CM | POA: Diagnosis not present

## 2016-05-31 DIAGNOSIS — L98491 Non-pressure chronic ulcer of skin of other sites limited to breakdown of skin: Secondary | ICD-10-CM | POA: Diagnosis not present

## 2016-05-31 DIAGNOSIS — E0842 Diabetes mellitus due to underlying condition with diabetic polyneuropathy: Secondary | ICD-10-CM | POA: Diagnosis not present

## 2016-05-31 NOTE — Progress Notes (Signed)
SUBJECTIVE: 80 y.o. year old IDDM female presents with skin lesions on left foot. She noted them on toes the other day. Patient is ambulatory using walker. Patient is referred by Dr. Georgina Snell.   REVIEW OF SYSTEMS: Medical record reviewed for comprehensive ROS and pertinent items noted in HPI.   OBJECTIVE: DERMATOLOGIC EXAMINATION: Dry red macular patch over medial aspect of left first MPJ, about 2 x 4 cm with red demarcated borders.   Macerated red skin rash with epidermal peeling at the first and 2nd web space where contracted digits contact each other. No active drainage noted.   VASCULAR EXAMINATION OF LOWER LIMBS: Pedal pulses: All pedal pulses are faintly palpable. Capillary filling times normal on all digits.  No sign of ischemic changes noted. Temperature gradient from tibial crest to dorsum of foot is within normal bilateral.  NEUROLOGIC EXAMINATION OF THE LOWER LIMBS: Epicritic and tactile sensations compromised.  MUSCULOSKELETAL EXAMINATION: Severely contacted 2nd digit at PIPJ and DIPJ left foot.  ASSESSMENT: Tinea pedis left foot, 1st and 2nd interdigital space with epidermal skin maceration, and red macular dry scaling skin at first MPJ medial.  Hammer toe deformity 2nd left.   IDDM with Kidney complication.  Diabetic peripheral neuropathy.   PLAN: Reviewed findings and home care instruction. Patient is to purchase OTC items: Lamisil antifungal cream, Tinactin foot power, and Cotton ball. Home care instruction reviewed. Also instructed to keep the area clean, dry and aerate between digits by avoiding closed in tennis shoes while in the house.  Return in 2 weeks for follow up.

## 2016-05-31 NOTE — Patient Instructions (Signed)
Seen for left foot lesions. Noted of fungal infection in between digits. Mild maceration without opening.  Keep the area clean and dry. Apply Lamisil antifungal cream daily after each shower or bath. Apply Tinactin foot power in between digits and keep cotton ball in between toes of left foot.  Avoid using tennis shoes in the house, and use open toed sandals while inside. Return in 2 weeks for follow up.

## 2016-06-01 DIAGNOSIS — J449 Chronic obstructive pulmonary disease, unspecified: Secondary | ICD-10-CM | POA: Diagnosis not present

## 2016-06-01 DIAGNOSIS — E119 Type 2 diabetes mellitus without complications: Secondary | ICD-10-CM | POA: Diagnosis not present

## 2016-06-01 DIAGNOSIS — B028 Zoster with other complications: Secondary | ICD-10-CM | POA: Diagnosis not present

## 2016-06-01 DIAGNOSIS — J45909 Unspecified asthma, uncomplicated: Secondary | ICD-10-CM | POA: Diagnosis not present

## 2016-06-01 DIAGNOSIS — M5440 Lumbago with sciatica, unspecified side: Secondary | ICD-10-CM | POA: Diagnosis not present

## 2016-06-01 DIAGNOSIS — M5417 Radiculopathy, lumbosacral region: Secondary | ICD-10-CM | POA: Diagnosis not present

## 2016-06-04 DIAGNOSIS — E119 Type 2 diabetes mellitus without complications: Secondary | ICD-10-CM | POA: Diagnosis not present

## 2016-06-04 DIAGNOSIS — J449 Chronic obstructive pulmonary disease, unspecified: Secondary | ICD-10-CM | POA: Diagnosis not present

## 2016-06-04 DIAGNOSIS — J45909 Unspecified asthma, uncomplicated: Secondary | ICD-10-CM | POA: Diagnosis not present

## 2016-06-04 DIAGNOSIS — M5417 Radiculopathy, lumbosacral region: Secondary | ICD-10-CM | POA: Diagnosis not present

## 2016-06-04 DIAGNOSIS — B028 Zoster with other complications: Secondary | ICD-10-CM | POA: Diagnosis not present

## 2016-06-04 DIAGNOSIS — M5440 Lumbago with sciatica, unspecified side: Secondary | ICD-10-CM | POA: Diagnosis not present

## 2016-06-14 ENCOUNTER — Ambulatory Visit (INDEPENDENT_AMBULATORY_CARE_PROVIDER_SITE_OTHER): Payer: Medicare Other | Admitting: Podiatry

## 2016-06-14 ENCOUNTER — Encounter: Payer: Self-pay | Admitting: Podiatry

## 2016-06-14 VITALS — BP 123/56 | HR 107

## 2016-06-14 DIAGNOSIS — E0842 Diabetes mellitus due to underlying condition with diabetic polyneuropathy: Secondary | ICD-10-CM

## 2016-06-14 DIAGNOSIS — M2042 Other hammer toe(s) (acquired), left foot: Secondary | ICD-10-CM | POA: Diagnosis not present

## 2016-06-14 DIAGNOSIS — L98491 Non-pressure chronic ulcer of skin of other sites limited to breakdown of skin: Secondary | ICD-10-CM | POA: Diagnosis not present

## 2016-06-14 DIAGNOSIS — B353 Tinea pedis: Secondary | ICD-10-CM | POA: Diagnosis not present

## 2016-06-14 NOTE — Patient Instructions (Signed)
Follow up on left foot Tinea pedis. Seen much improvement. Continue with Foot powder and cotton ball in between digits. Plantar callus debrided under the big joint. Return as needed.

## 2016-06-14 NOTE — Progress Notes (Signed)
SUBJECTIVE: 80 y.o. year old IDDM female presents for 2 week follow up on left foot skin lesions. Stated that she was following home care instruction given last visit. Patient requests trimming left foot callus that is hurting.  Patient is ambulatory using walker. Yesterday blood sugar was 158. Stated that it has been difficult while her husband was sick, but she will watch what she eat.   OBJECTIVE: DERMATOLOGIC EXAMINATION: Left foot in between the digits are dry with mild peeling skin at the first and 2nd interdigital space. Medial first MPJ area has subsided erythema with residual dry peeling skin.  Positive of plantar callus under the first MPJ left. VASCULAR EXAMINATION OF LOWER LIMBS: All pedal pulses are faintly palpable. Capillary filling times normal on all digits.   NEUROLOGIC EXAMINATION OF THE LOWER LIMBS: Epicritic and tactile sensations compromised.  MUSCULOSKELETAL EXAMINATION: Severely contacted 2nd digit at PIPJ and DIPJ left foot.  ASSESSMENT: Improved Tinea pedis left foot at the first and 2nd interdigital space and medial first MPJ area.  Hammer toe deformity 2nd left.   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 IDDM with Kidney complication.  Diabetic peripheral neuropathy.   PLAN: Continue with Foot powder and cotton ball in between digits on left foot while in closed in shoes . Discussed improving her diet and watch what is eat. Plantar callus debrided under the first MPJ area left foot.  Return as needed.

## 2016-06-16 ENCOUNTER — Other Ambulatory Visit: Payer: Self-pay | Admitting: Family Medicine

## 2016-06-17 ENCOUNTER — Ambulatory Visit (INDEPENDENT_AMBULATORY_CARE_PROVIDER_SITE_OTHER): Payer: Medicare Other | Admitting: Family Medicine

## 2016-06-17 ENCOUNTER — Encounter: Payer: Self-pay | Admitting: Family Medicine

## 2016-06-17 VITALS — BP 148/57 | HR 96 | Temp 98.2°F | Wt 170.0 lb

## 2016-06-17 DIAGNOSIS — Z794 Long term (current) use of insulin: Secondary | ICD-10-CM

## 2016-06-17 DIAGNOSIS — I701 Atherosclerosis of renal artery: Secondary | ICD-10-CM

## 2016-06-17 DIAGNOSIS — R059 Cough, unspecified: Secondary | ICD-10-CM

## 2016-06-17 DIAGNOSIS — R05 Cough: Secondary | ICD-10-CM

## 2016-06-17 DIAGNOSIS — N898 Other specified noninflammatory disorders of vagina: Secondary | ICD-10-CM

## 2016-06-17 DIAGNOSIS — N183 Chronic kidney disease, stage 3 unspecified: Secondary | ICD-10-CM

## 2016-06-17 DIAGNOSIS — L298 Other pruritus: Secondary | ICD-10-CM | POA: Diagnosis not present

## 2016-06-17 DIAGNOSIS — R300 Dysuria: Secondary | ICD-10-CM

## 2016-06-17 DIAGNOSIS — E1122 Type 2 diabetes mellitus with diabetic chronic kidney disease: Secondary | ICD-10-CM

## 2016-06-17 LAB — POCT URINALYSIS DIPSTICK
BILIRUBIN UA: NEGATIVE
Glucose, UA: NEGATIVE
Ketones, UA: NEGATIVE
NITRITE UA: NEGATIVE
PH UA: 7
Protein, UA: 30
SPEC GRAV UA: 1.015
UROBILINOGEN UA: 0.2

## 2016-06-17 LAB — WET PREP FOR TRICH, YEAST, CLUE
CLUE CELLS WET PREP: NONE SEEN
TRICH WET PREP: NONE SEEN
Yeast Wet Prep HPF POC: NONE SEEN

## 2016-06-17 MED ORDER — OXYCODONE-ACETAMINOPHEN 5-325 MG PO TABS: 1.0000 | ORAL_TABLET | Freq: Every evening | ORAL | 0 refills | Status: DC | PRN

## 2016-06-17 MED ORDER — BENZONATATE 200 MG PO CAPS: 200.0000 mg | ORAL_CAPSULE | Freq: Three times a day (TID) | ORAL | 0 refills | Status: DC | PRN

## 2016-06-17 MED ORDER — CEFDINIR 300 MG PO CAPS: 300.0000 mg | ORAL_CAPSULE | Freq: Two times a day (BID) | ORAL | 0 refills | Status: DC

## 2016-06-17 NOTE — Patient Instructions (Addendum)
Thank you for coming in today. Increase insulin from 28 units to 32 units daily.  Use oxycodone sparingly.  Take omnicef for UTI.  Use tessalon for cough.  Return in 1 month.   Call or go to the emergency room if you get worse, have trouble breathing, have chest pains, or palpitations.

## 2016-06-17 NOTE — Progress Notes (Signed)
Briana Werner is a 80 y.o. female who presents to [facility] today with multiple complaints.  1.  Dysuria:  Started one month ago.  Patient reports a burning pain in her vagina only when she urinates.  No vaginal itching, discharge, or bleeding.  She endorses increased urinary frequency.  She states her incontinence has increased compared her baseline.  No fever, chills, abdominal pain, or flank pain.  2.  Cough: started 2 weeks ago.  Patient has a history of COPD and is on singulair and albuterol.  There is confusion as to whether or not she has taken these medications due to her underlying dementia.  She denies shortness of breath, wheezing, chest discomfort,and reflux symptoms.  Patient reports a runny nose started 2 days ago.    3.  Diabetes mellitus:  Sugars have been at least 150 for the last month or so   Past Medical History:  Diagnosis Date  . Asthma   . Atrial flutter ([place])   . CAD (coronary artery disease)   . Cerebrovascular disease   . Chronic renal insufficiency 02/26/2014  . COPD (chronic obstructive pulmonary disease) ([place]) 02/26/2014  . Hypothyroid 02/26/2014  . Presence of stent in coronary artery in patient with coronary artery disease 02/26/2014  . Renal artery stenosis ([place])   . Type 2 diabetes mellitus ([place]) 02/26/2014   Past Surgical History:  Procedure Laterality Date  . ABDOMINAL HYSTERECTOMY    . APPENDECTOMY    . BACK SURGERY    . CHOLECYSTECTOMY    . CORONARY ARTERY BYPASS GRAFT     2004, [place]  . Hip replacement    . KNEE SURGERY Bilateral   . NASAL SINUS SURGERY    . SHOULDER SURGERY    . TONSILLECTOMY     Social History  Substance Use Topics  . Smoking status: Never Smoker  . Smokeless tobacco: Never Used  . Alcohol use No   family history includes CAD in her father; Heart attack in her father.  ROS as  above:  Medications: Current Outpatient Prescriptions  Medication Sig Dispense Refill  . AMBULATORY NON FORMULARY MEDICATION Diabetic Test Strips and Lancets: One Touch Ultra.   Dx Type 2 Diabetes.e11.21  Use to check blood sugar twice a day. 100 Units 3  . AMBULATORY NON FORMULARY MEDICATION Insulin needles for lantus solastar  31g x4 100 each 11  . atorvastatin (LIPITOR) 40 MG tablet TAKE 1 TABLET(40 MG) BY MOUTH DAILY AT 6 PM 30 tablet 0  . Calcium Carbonate-Vitamin D (CALTRATE 600+D PO) Take by mouth 2 (two) times daily.    . carvedilol (COREG) 6.25 MG tablet TAKE 1 TABLET BY MOUTH TWICE DAILY WITH MEALS 60 tablet 0  . ELIQUIS 5 MG TABS tablet TAKE 1 TABLET(5 MG) BY MOUTH TWICE DAILY 60 tablet 0  . escitalopram (LEXAPRO) 5 MG tablet Take 1 tablet (5 mg total) by mouth daily. 90 tablet 0  . FeFum-FePoly-FA-B Cmp-C-Biot (INTEGRA PLUS) CAPS TAKE 1 CAPSULE BY MOUTH DAILY 30 capsule 0  . gabapentin (NEURONTIN) 300 MG capsule TAKE 1 CAPSULE(300 MG) BY MOUTH THREE TIMES DAILY 90 capsule 0  . Insulin Glargine (TOUJEO SOLOSTAR) 300 UNIT/ML SOPN Inject 38 Units into the skin daily. 4.5 mL 2  . IRON PO Take by mouth.    . levothyroxine (SYNTHROID, LEVOTHROID) 88 MCG tablet TAKE 1 TABLET BY MOUTH DAILY BEFORE BREAKFAST 90 tablet 0  . lisinopril (PRINIVIL,ZESTRIL) 10 MG tablet TAKE  1 TABLET BY MOUTH ONCE DAILY 30 tablet 1  . montelukast (SINGULAIR) 10 MG tablet TAKE 1 TABLET BY MOUTH AT BEDTIME 30 tablet 0  . Multiple Vitamin (MULTIVITAMIN) capsule Take 1 capsule by mouth.    . ONE TOUCH ULTRA TEST test strip CHECK BLOOD GLUCOSE TWICE DAILY 100 each 1  . pantoprazole (PROTONIX) 40 MG tablet TAKE 1 TABLET BY MOUTH DAILY 30 tablet 0  . PROVENTIL HFA 108 (90 Base) MCG/ACT inhaler INHALE 2 PUFFS BY MOUTH EVERY 4 TO 6 HOURS AS NEEDED FOR SHORTNESS OF BREATH OR WHEEZING 6.7 g 0  . sitaGLIPtin (JANUVIA) 50 MG tablet Take 1 tablet (50 mg total) by mouth daily. 90 tablet 0  . VESICARE 5 MG tablet TK 1 T PO  D  11  . oxyCODONE-acetaminophen (ROXICET) 5-325 MG tablet Take 1 tablet by mouth at bedtime as needed for severe pain. 30 tablet 0   No current facility-administered medications for this visit.    Allergies  Allergen Reactions  . Tetracycline Shortness Of Breath  . Tetracyclines & Related Anaphylaxis and Shortness Of Breath    dizziness  . Other Dermatitis    Bandaids   . Amlodipine     Edema  . Amoxicillin     Questionable rash  . Bactrim [Sulfamethoxazole-Trimethoprim] Other (See Comments)    dizzy dizzy  . Latex Dermatitis    Bandages, pulls skin  . Tape Dermatitis    Bandaids  Bandaids      Exam:  BP (!) 148/57   Pulse 96   Temp 98.2 F (36.8 C) (Oral)   Wt 170 lb (77.1 kg)   BMI 27.44 kg/m  Gen: Well NAD, nontoxic Lungs: Normal work of breathing. CTABL, no wheezing Heart: RRR no MRG  Abd: NABS, Soft. Nondistended, Nontender, no CVA tenderness Exts: Brisk capillary refill, warm and well perfused.    Urinalysis: positive for blood, leukocyte esterase, and nitrites  Assessment and Plan: 80 y.o. female with:  1.  UTI -   Omnicef 300 mg BID  2.  Cough:  Unclear etiology.  Differential includes COPD, medication noncompliance, postnasal drip, and ACE inhibitor cough.   - Resume COPD meds, follow up if not improved - Tessalon pearls  3.  Diabetes mellitus:  Poorly controlled - Increased insulin from 28 to 32 units   Orders Placed This Encounter  Procedures  . WET PREP FOR [place], YEAST, CLUE    Discussed warning signs or symptoms. Please see discharge instructions. Patient expresses understanding.

## 2016-06-18 ENCOUNTER — Other Ambulatory Visit: Payer: Self-pay | Admitting: *Deleted

## 2016-06-18 MED ORDER — APIXABAN 5 MG PO TABS: ORAL_TABLET | ORAL | 2 refills | Status: DC

## 2016-06-18 MED ORDER — LISINOPRIL 10 MG PO TABS: 10.0000 mg | ORAL_TABLET | Freq: Every day | ORAL | 1 refills | Status: DC

## 2016-06-19 ENCOUNTER — Other Ambulatory Visit: Payer: Self-pay | Admitting: Family Medicine

## 2016-06-19 LAB — URINE CULTURE

## 2016-07-08 ENCOUNTER — Other Ambulatory Visit: Payer: Self-pay | Admitting: Family Medicine

## 2016-07-08 DIAGNOSIS — B029 Zoster without complications: Secondary | ICD-10-CM

## 2016-07-13 ENCOUNTER — Ambulatory Visit (INDEPENDENT_AMBULATORY_CARE_PROVIDER_SITE_OTHER): Payer: Medicare Other | Admitting: Family Medicine

## 2016-07-13 VITALS — BP 159/55 | HR 69 | Temp 98.0°F | Wt 170.0 lb

## 2016-07-13 DIAGNOSIS — E1122 Type 2 diabetes mellitus with diabetic chronic kidney disease: Secondary | ICD-10-CM

## 2016-07-13 DIAGNOSIS — F0390 Unspecified dementia without behavioral disturbance: Secondary | ICD-10-CM | POA: Diagnosis not present

## 2016-07-13 DIAGNOSIS — E1142 Type 2 diabetes mellitus with diabetic polyneuropathy: Secondary | ICD-10-CM

## 2016-07-13 DIAGNOSIS — N183 Chronic kidney disease, stage 3 unspecified: Secondary | ICD-10-CM

## 2016-07-13 DIAGNOSIS — I701 Atherosclerosis of renal artery: Secondary | ICD-10-CM | POA: Diagnosis not present

## 2016-07-13 DIAGNOSIS — Z794 Long term (current) use of insulin: Secondary | ICD-10-CM

## 2016-07-13 DIAGNOSIS — R05 Cough: Secondary | ICD-10-CM | POA: Diagnosis not present

## 2016-07-13 DIAGNOSIS — R059 Cough, unspecified: Secondary | ICD-10-CM | POA: Insufficient documentation

## 2016-07-13 DIAGNOSIS — M25552 Pain in left hip: Secondary | ICD-10-CM

## 2016-07-13 DIAGNOSIS — J449 Chronic obstructive pulmonary disease, unspecified: Secondary | ICD-10-CM

## 2016-07-13 MED ORDER — FLUTICASONE-SALMETEROL 500-50 MCG/DOSE IN AEPB: 1.0000 | INHALATION_SPRAY | Freq: Two times a day (BID) | RESPIRATORY_TRACT | 6 refills | Status: DC

## 2016-07-13 NOTE — Patient Instructions (Addendum)
Thank you for coming in today. We will re-order the bone scan.  Increase the insulin to 35 units.  You should hear about the swallow study as well as the bone scan.  Return for recheck in 2-3 weeks.   Dysphagia Swallowing problems (dysphagia) occur when solids and liquids seem to stick in your throat on the way down to your stomach, or the food takes longer to get to the stomach. Other symptoms include regurgitating food, noises coming from the throat, chest discomfort with swallowing, and a feeling of fullness or the feeling of something being stuck in your throat when swallowing. When blockage in your throat is complete, it may be associated with drooling. CAUSES  Problems with swallowing may occur because of problems with the muscles. The food cannot be propelled in the usual manner into your stomach. You may have ulcers, scar tissue, or inflammation in the tube down which food travels from your mouth to your stomach (esophagus), which blocks food from passing normally into the stomach. Causes of inflammation include:  Acid reflux from your stomach into your esophagus.  Infection.  Radiation treatment for cancer.  Medicines taken without enough fluids to wash them down into your stomach. You may have nerve problems that prevent signals from being sent to the muscles of your esophagus to contract and move your food down to your stomach. Globus pharyngeus is a relatively common problem in which there is a sense of an obstruction or difficulty in swallowing, without any physical abnormalities of the swallowing passages being found. This problem usually improves over time with reassurance and testing to rule out other causes. DIAGNOSIS Dysphagia can be diagnosed and its cause can be determined by tests in which you swallow a white substance that helps illuminate the inside of your throat (contrast medium) while X-rays are taken. Sometimes a flexible telescope that is inserted down your throat  (endoscopy) to look at your esophagus and stomach is used. TREATMENT   If the dysphagia is caused by acid reflux or infection, medicines may be used.  If the dysphagia is caused by problems with your swallowing muscles, swallowing therapy may be used to help you strengthen your swallowing muscles.  If the dysphagia is caused by a blockage or mass, procedures to remove the blockage may be done. HOME CARE INSTRUCTIONS  Try to eat soft food that is easier to swallow and check your weight on a daily basis to be sure that it is not decreasing.  Be sure to drink liquids when sitting upright (not lying down). SEEK MEDICAL CARE IF:  You are losing weight because you are unable to swallow.  You are coughing when you drink liquids (aspiration).  You are coughing up partially digested food. SEEK IMMEDIATE MEDICAL CARE IF:  You are unable to swallow your own saliva .  You are having shortness of breath or a fever, or both.  You have a hoarse voice along with difficulty swallowing. MAKE SURE YOU:  Understand these instructions.  Will watch your condition.  Will get help right away if you are not doing well or get worse.   This information is not intended to replace advice given to you by your health care provider. Make sure you discuss any questions you have with your health care provider.   Document Released: 08/27/2000 Document Revised: 09/20/2014 Document Reviewed: 02/16/2013 Elsevier Interactive Patient Education [employer].

## 2016-07-13 NOTE — Progress Notes (Signed)
Patient was scheduled and no showed for her bone scan on 05/27/16. Called [facility] scheduling, left VM for them to contact Pt to reschedule. All information provided.

## 2016-07-13 NOTE — Progress Notes (Signed)
Briana Werner is a 80 y.o. female who presents to [facility] today for cough, diabetes, hip pain.  Cough: Patient notes continued coughing. She notes she typically coughs after she drinks liquids. She typically does not have any trouble swallowing food. She denies significant shortness of breath fevers or chills. She takes albuterol and uses Advair. She denies significant wheezing or shortness of breath. She thinks in the past she may have had a swallow study that was abnormal but cannot recall.  Diabetes: Patient notes continued poorly controlled diabetes. She takes 20 units of insulin daily. She notes her fasting sugars are typically greater than 200.  Left hip pain: Patient has ongoing left anterior hip pain. In the past she had a three-phase bone scan ordered but has not had this completed. She did not show for her appointment. She was unaware there was an appointment.   Past Medical History:  Diagnosis Date  . Asthma   . Atrial flutter ([place])   . CAD (coronary artery disease)   . Cerebrovascular disease   . Chronic renal insufficiency 02/26/2014  . COPD (chronic obstructive pulmonary disease) ([place]) 02/26/2014  . Hypothyroid 02/26/2014  . Presence of stent in coronary artery in patient with coronary artery disease 02/26/2014  . Renal artery stenosis ([place])   . Type 2 diabetes mellitus ([place]) 02/26/2014   Past Surgical History:  Procedure Laterality Date  . ABDOMINAL HYSTERECTOMY    . APPENDECTOMY    . BACK SURGERY    . CHOLECYSTECTOMY    . CORONARY ARTERY BYPASS GRAFT     2004, [place]  . Hip replacement    . KNEE SURGERY Bilateral   . NASAL SINUS SURGERY    . SHOULDER SURGERY    . TONSILLECTOMY     Social History  Substance Use Topics  . Smoking status: Never Smoker  . Smokeless tobacco: Never Used  . Alcohol use No   family history includes CAD in her  father; Heart attack in her father.  ROS as above:  Medications: Current Outpatient Prescriptions  Medication Sig Dispense Refill  . AMBULATORY NON FORMULARY MEDICATION Diabetic Test Strips and Lancets: One Touch Ultra.   Dx Type 2 Diabetes.e11.21  Use to check blood sugar twice a day. 100 Units 3  . AMBULATORY NON FORMULARY MEDICATION Insulin needles for lantus solastar  31g x4 100 each 11  . apixaban (ELIQUIS) 5 MG TABS tablet TAKE 1 TABLET(5 MG) BY MOUTH TWICE DAILY 60 tablet 2  . atorvastatin (LIPITOR) 40 MG tablet TAKE 1 TABLET(40 MG) BY MOUTH DAILY AT 6 PM 30 tablet 0  . Calcium Carbonate-Vitamin D (CALTRATE 600+D PO) Take by mouth 2 (two) times daily.    . carvedilol (COREG) 6.25 MG tablet TAKE 1 TABLET BY MOUTH TWICE DAILY WITH MEALS 60 tablet 0  . escitalopram (LEXAPRO) 5 MG tablet Take 1 tablet (5 mg total) by mouth daily. 90 tablet 0  . FeFum-FePoly-FA-B Cmp-C-Biot (INTEGRA PLUS) CAPS TAKE 1 CAPSULE BY MOUTH DAILY 30 capsule 0  . gabapentin (NEURONTIN) 300 MG capsule TAKE 1 CAPSULE(300 MG) BY MOUTH THREE TIMES DAILY 90 capsule 0  . Insulin Glargine (TOUJEO SOLOSTAR) 300 UNIT/ML SOPN Inject 38 Units into the skin daily. 4.5 mL 2  . IRON PO Take by mouth.    . levothyroxine (SYNTHROID, LEVOTHROID) 88 MCG tablet TAKE 1 TABLET BY MOUTH DAILY BEFORE BREAKFAST 90 tablet 0  . lisinopril (PRINIVIL,ZESTRIL) 10 MG tablet Take 1  tablet (10 mg total) by mouth daily. 30 tablet 1  . montelukast (SINGULAIR) 10 MG tablet TAKE 1 TABLET BY MOUTH AT BEDTIME 30 tablet 0  . Multiple Vitamin (MULTIVITAMIN) capsule Take 1 capsule by mouth.    . ONE TOUCH ULTRA TEST test strip CHECK BLOOD GLUCOSE TWICE DAILY 100 each 1  . oxyCODONE-acetaminophen (ROXICET) 5-325 MG tablet Take 1 tablet by mouth at bedtime as needed for severe pain. 30 tablet 0  . pantoprazole (PROTONIX) 40 MG tablet TAKE 1 TABLET BY MOUTH DAILY 30 tablet 0  . PROVENTIL HFA 108 (90 Base) MCG/ACT inhaler INHALE 2 PUFFS BY MOUTH EVERY 4  TO 6 HOURS AS NEEDED FOR SHORTNESS OF BREATH OR WHEEZING 6.7 g 0  . sitaGLIPtin (JANUVIA) 50 MG tablet Take 1 tablet (50 mg total) by mouth daily. 90 tablet 0  . VESICARE 5 MG tablet TK 1 T PO D  11  . Fluticasone-Salmeterol (ADVAIR DISKUS) 500-50 MCG/DOSE AEPB Inhale 1 puff into the lungs 2 (two) times daily. 60 each 6   No current facility-administered medications for this visit.    Allergies  Allergen Reactions  . Tetracycline Shortness Of Breath  . Tetracyclines & Related Anaphylaxis and Shortness Of Breath    dizziness  . Other Dermatitis    Bandaids   . Amlodipine     Edema  . Amoxicillin     Questionable rash  . Bactrim [Sulfamethoxazole-Trimethoprim] Other (See Comments)    dizzy dizzy  . Latex Dermatitis    Bandages, pulls skin  . Tape Dermatitis    Bandaids  Bandaids     Health Maintenance Health Maintenance  Topic Date Due  . OPHTHALMOLOGY EXAM  07/16/1946  . ZOSTAVAX  07/16/1998  . DEXA SCAN  07/17/2003  . HEMOGLOBIN A1C  11/22/2016  . FOOT EXAM  05/25/2017  . PNA vac Low Risk Adult (2 of 2 - PPSV23) 05/25/2017  . TETANUS/TDAP  05/25/2026  . INFLUENZA VACCINE  Completed     Exam:  BP (!) 159/55   Pulse 69   Temp 98 F (36.7 C) (Oral)   Wt 170 lb (77.1 kg)   SpO2 98%   BMI 27.44 kg/m  Gen: Well NAD Nontoxic appearing HEENT: EOMI,  MMM Lungs: Normal work of breathing. CTABL Heart: Irregular but normal rate. no MRG Abd: NABS, Soft. Nondistended, Nontender Exts: Brisk capillary refill, warm and well perfused.  Left hip: Nontender. Pain with motion. Normal gait.   No results found for this or any previous visit (from the past 72 hour(s)). No results found.    Assessment and Plan: 81 y.o. female with  Cough: Concerning for thin liquid aspiration. Refer for swallow study and swallow evaluation. Recheck in the near future. Continue COPD medications.  Diabetes: Increase insulin to 35 units. Recheck in 2-3 weeks.  Hip pain: We'll reorder  three-phase bone scan to evaluate for prosthesis loosening.   Orders Placed This Encounter  Procedures  . DG OP Swallowing Func-Medicare/Speech Path    Standing Status:   Future    Standing Expiration Date:   09/12/2017    Order Specific Question:   Reason for Exam (SYMPTOM  OR DIAGNOSIS REQUIRED)    Answer:   cough with drinking water    Order Specific Question:   Preferred imaging location?    Answer:   [facility]  . SLP modified barium swallow    Standing Status:   Future    Standing Expiration Date:   07/13/2017    Discussed  warning signs or symptoms. Please see discharge instructions. Patient expresses understanding.

## 2016-07-20 ENCOUNTER — Encounter: Payer: Self-pay | Admitting: Family Medicine

## 2016-07-20 ENCOUNTER — Other Ambulatory Visit (HOSPITAL_COMMUNITY): Payer: Self-pay | Admitting: Family Medicine

## 2016-07-20 ENCOUNTER — Ambulatory Visit (INDEPENDENT_AMBULATORY_CARE_PROVIDER_SITE_OTHER): Payer: Medicare Other | Admitting: Family Medicine

## 2016-07-20 VITALS — BP 156/70 | HR 73

## 2016-07-20 DIAGNOSIS — E1122 Type 2 diabetes mellitus with diabetic chronic kidney disease: Secondary | ICD-10-CM

## 2016-07-20 DIAGNOSIS — N183 Chronic kidney disease, stage 3 unspecified: Secondary | ICD-10-CM

## 2016-07-20 DIAGNOSIS — R05 Cough: Secondary | ICD-10-CM

## 2016-07-20 DIAGNOSIS — Z794 Long term (current) use of insulin: Secondary | ICD-10-CM

## 2016-07-20 DIAGNOSIS — I701 Atherosclerosis of renal artery: Secondary | ICD-10-CM

## 2016-07-20 DIAGNOSIS — R059 Cough, unspecified: Secondary | ICD-10-CM

## 2016-07-20 DIAGNOSIS — E1142 Type 2 diabetes mellitus with diabetic polyneuropathy: Secondary | ICD-10-CM | POA: Diagnosis not present

## 2016-07-20 DIAGNOSIS — M25552 Pain in left hip: Secondary | ICD-10-CM

## 2016-07-20 DIAGNOSIS — R1319 Other dysphagia: Secondary | ICD-10-CM

## 2016-07-20 NOTE — Patient Instructions (Signed)
Thank you for coming in today. Go to xray now and talk with Bonnita Nasuti about schedule the tests.  Keep taking the same amount of insulin.  Return in 1 month.

## 2016-07-20 NOTE — Progress Notes (Signed)
Briana Werner is a 80 y.o. female who presents to [facility] today for follow-up diabetes, hip pain, swallow.  Diabetes: Patient was seen recently for poorly controlled diabetes. Her glargine insulin was increased to 35 units daily. She notes her fasting sugars are typically in the 130s. She denies any hypoglycemic episodes. She denies polyuria or polydipsia.  Hip pain: Patient has significant left hip pain. She's had trouble scheduling the bone scan that was previously ordered. She is not quite sure when this is supposed to be.  Cough/swallow: Patient notes continued cough and difficulty with swallow. She is an appointment with speech therapy November 13.   Past Medical History:  Diagnosis Date  . Asthma   . Atrial flutter ([place])   . CAD (coronary artery disease)   . Cerebrovascular disease   . Chronic renal insufficiency 02/26/2014  . COPD (chronic obstructive pulmonary disease) ([place]) 02/26/2014  . Hypothyroid 02/26/2014  . Presence of stent in coronary artery in patient with coronary artery disease 02/26/2014  . Renal artery stenosis ([place])   . Type 2 diabetes mellitus ([place]) 02/26/2014   Past Surgical History:  Procedure Laterality Date  . ABDOMINAL HYSTERECTOMY    . APPENDECTOMY    . BACK SURGERY    . CHOLECYSTECTOMY    . CORONARY ARTERY BYPASS GRAFT     2004, [place]  . Hip replacement    . KNEE SURGERY Bilateral   . NASAL SINUS SURGERY    . SHOULDER SURGERY    . TONSILLECTOMY     Social History  Substance Use Topics  . Smoking status: Never Smoker  . Smokeless tobacco: Never Used  . Alcohol use No   family history includes CAD in her father; Heart attack in her father.  ROS as above:  Medications: Current Outpatient Prescriptions  Medication Sig Dispense Refill  . AMBULATORY NON FORMULARY MEDICATION Diabetic Test Strips and Lancets: One Touch  Ultra.   Dx Type 2 Diabetes.e11.21  Use to check blood sugar twice a day. 100 Units 3  . AMBULATORY NON FORMULARY MEDICATION Insulin needles for lantus solastar  31g x4 100 each 11  . apixaban (ELIQUIS) 5 MG TABS tablet TAKE 1 TABLET(5 MG) BY MOUTH TWICE DAILY 60 tablet 2  . atorvastatin (LIPITOR) 40 MG tablet TAKE 1 TABLET(40 MG) BY MOUTH DAILY AT 6 PM 30 tablet 0  . Calcium Carbonate-Vitamin D (CALTRATE 600+D PO) Take by mouth 2 (two) times daily.    . carvedilol (COREG) 6.25 MG tablet TAKE 1 TABLET BY MOUTH TWICE DAILY WITH MEALS 60 tablet 0  . escitalopram (LEXAPRO) 5 MG tablet Take 1 tablet (5 mg total) by mouth daily. 90 tablet 0  . FeFum-FePoly-FA-B Cmp-C-Biot (INTEGRA PLUS) CAPS TAKE 1 CAPSULE BY MOUTH DAILY 30 capsule 0  . Fluticasone-Salmeterol (ADVAIR DISKUS) 500-50 MCG/DOSE AEPB Inhale 1 puff into the lungs 2 (two) times daily. 60 each 6  . gabapentin (NEURONTIN) 300 MG capsule TAKE 1 CAPSULE(300 MG) BY MOUTH THREE TIMES DAILY 90 capsule 0  . Insulin Glargine (TOUJEO SOLOSTAR) 300 UNIT/ML SOPN Inject 38 Units into the skin daily. 4.5 mL 2  . IRON PO Take by mouth.    . levothyroxine (SYNTHROID, LEVOTHROID) 88 MCG tablet TAKE 1 TABLET BY MOUTH DAILY BEFORE BREAKFAST 90 tablet 0  . lisinopril (PRINIVIL,ZESTRIL) 10 MG tablet Take 1 tablet (10 mg total) by mouth daily. 30 tablet 1  . montelukast (SINGULAIR) 10 MG tablet TAKE 1 TABLET  BY MOUTH AT BEDTIME 30 tablet 0  . Multiple Vitamin (MULTIVITAMIN) capsule Take 1 capsule by mouth.    . ONE TOUCH ULTRA TEST test strip CHECK BLOOD GLUCOSE TWICE DAILY 100 each 1  . oxyCODONE-acetaminophen (ROXICET) 5-325 MG tablet Take 1 tablet by mouth at bedtime as needed for severe pain. 30 tablet 0  . pantoprazole (PROTONIX) 40 MG tablet TAKE 1 TABLET BY MOUTH DAILY 30 tablet 0  . PROVENTIL HFA 108 (90 Base) MCG/ACT inhaler INHALE 2 PUFFS BY MOUTH EVERY 4 TO 6 HOURS AS NEEDED FOR SHORTNESS OF BREATH OR WHEEZING 6.7 g 0  . sitaGLIPtin (JANUVIA) 50 MG  tablet Take 1 tablet (50 mg total) by mouth daily. 90 tablet 0  . VESICARE 5 MG tablet TK 1 T PO D  11   No current facility-administered medications for this visit.    Allergies  Allergen Reactions  . Tetracycline Shortness Of Breath  . Tetracyclines & Related Anaphylaxis and Shortness Of Breath    dizziness  . Other Dermatitis    Bandaids   . Amlodipine     Edema  . Amoxicillin     Questionable rash  . Bactrim [Sulfamethoxazole-Trimethoprim] Other (See Comments)    dizzy dizzy  . Latex Dermatitis    Bandages, pulls skin  . Tape Dermatitis    Bandaids  Bandaids     Health Maintenance Health Maintenance  Topic Date Due  . OPHTHALMOLOGY EXAM  07/16/1946  . ZOSTAVAX  07/16/1998  . DEXA SCAN  07/17/2003  . HEMOGLOBIN A1C  11/22/2016  . FOOT EXAM  05/25/2017  . PNA vac Low Risk Adult (2 of 2 - PPSV23) 05/25/2017  . TETANUS/TDAP  05/25/2026  . INFLUENZA VACCINE  Completed     Exam:  BP (!) 156/70   Pulse 73   SpO2 95%  Gen: Well NAD She ambulates with a walker.   No results found for this or any previous visit (from the past 72 hour(s)). No results found.    Assessment and Plan: 80 y.o. female with  Diabetes: Improved. Continue current regimen. Recheck in one month. Additionally we will refer to care management  Left hip pain: Patient was scheduled today for bone scan evaluation on November 13 at the same day as her swallow evaluation.  Swallow/cough: Pending evaluation November 13.     Orders Placed This Encounter  Procedures  . AMB Referral to [place] Management    Referral Priority:   Routine    Referral Type:   Consultation    Referral Reason:   Specialty Services Required    Number of Visits Requested:   1    Discussed warning signs or symptoms. Please see discharge instructions. Patient expresses understanding.

## 2016-07-21 ENCOUNTER — Other Ambulatory Visit: Payer: Self-pay | Admitting: *Deleted

## 2016-07-21 NOTE — Patient Outreach (Signed)
[facility]) Care Management  07/21/2016  Briana Werner [birth year] [id number]  Referral from MD office: reason for consult complex care with confusion; pharmacy involved;  dxs diabetes, kidney failure.  Telephone call to patient; left message on voice mail requesting call back.  Plan: Will follow up. [name], RN BSN [place] Management Coordinator [facility] Care Management  [phone]

## 2016-07-22 ENCOUNTER — Telehealth: Payer: Self-pay

## 2016-07-22 ENCOUNTER — Other Ambulatory Visit: Payer: Self-pay | Admitting: *Deleted

## 2016-07-22 ENCOUNTER — Other Ambulatory Visit: Payer: Self-pay | Admitting: Family Medicine

## 2016-07-22 NOTE — Telephone Encounter (Signed)
Pt advised to come in office for UTI Sx

## 2016-07-22 NOTE — Patient Outreach (Signed)
[facility]) Care Management  07/22/2016  Briana Werner 1936/04/05 [id number]  MD referral: Second telephone call to patient who was advised of reason for call & of [facility] care management services. Patient gave HIPPA verification.   Patient agreed to screening assessment. States her major concern was trying to get blood sugar levels under control. States she does not remember last A1c (per chart review last A1c 8.1 on 05/25/2016). Patient voices that she is currently checking blood sugar once daily in the PM & administers Toujeo in the PM. States her daughter fixes her medication box & she takes medication as prescribed by her doctor.   Patient voices that she also gets assistance about her health concerns from her son who is a Marine scientist.  Voices that she drives to all of her appointments & sometimes her daughter accompanies her.  States is able cook & goes grocery shipping. States she & spouse live in independent senior housing. States 2 meals are provided by apartments if residents wish to partake.   Patient states she uses walker or cane to get around. Voices that she walks with caution because of 2 falls previously.  Falls prevention discussed with patient who voices understanding.   Recommendation to patient for community case management. Patient voices that she did not feel the need for community case management. States she has support from her 2 children who assist with medication management & health concerns, Patient advised of Telephonic Health Coaching services for diabetes management.  Patient consents to telephonic health coaching.  Plan:  Refer to care management assistant to assign to Health Coach for disease management . Dx diabetes mellitus. Telephonic signing off.   Briana Daisy, RN BSN [place] Management Coordinator [facility] Care Management  [phone]

## 2016-07-26 ENCOUNTER — Encounter (HOSPITAL_COMMUNITY)
Admission: RE | Admit: 2016-07-26 | Discharge: 2016-07-26 | Disposition: A | Discharge status: Home / Self Care | Payer: Medicare Other | Source: Ambulatory Visit | Attending: Family Medicine | Admitting: Family Medicine

## 2016-07-26 ENCOUNTER — Ambulatory Visit (HOSPITAL_COMMUNITY)
Admission: RE | Admit: 2016-07-26 | Discharge: 2016-07-26 | Disposition: A | Discharge status: Home / Self Care | Payer: Medicare Other | Source: Ambulatory Visit | Attending: Family Medicine | Admitting: Family Medicine

## 2016-07-26 DIAGNOSIS — R059 Cough, unspecified: Secondary | ICD-10-CM

## 2016-07-26 DIAGNOSIS — Z96642 Presence of left artificial hip joint: Secondary | ICD-10-CM

## 2016-07-26 DIAGNOSIS — R05 Cough: Secondary | ICD-10-CM | POA: Insufficient documentation

## 2016-07-26 DIAGNOSIS — F0390 Unspecified dementia without behavioral disturbance: Secondary | ICD-10-CM

## 2016-07-26 DIAGNOSIS — R1310 Dysphagia, unspecified: Secondary | ICD-10-CM | POA: Diagnosis not present

## 2016-07-26 DIAGNOSIS — M25552 Pain in left hip: Secondary | ICD-10-CM | POA: Diagnosis not present

## 2016-07-26 DIAGNOSIS — M25562 Pain in left knee: Secondary | ICD-10-CM | POA: Diagnosis not present

## 2016-07-26 MED ORDER — TECHNETIUM TC 99M MEDRONATE IV KIT
25.0000 | PACK | Freq: Once | INTRAVENOUS | Status: AC | PRN
  Administered 2016-07-26: 21.8 via INTRAVENOUS

## 2016-07-27 ENCOUNTER — Other Ambulatory Visit: Payer: Self-pay | Admitting: Family Medicine

## 2016-08-02 ENCOUNTER — Other Ambulatory Visit: Payer: Self-pay | Admitting: Family Medicine

## 2016-08-03 ENCOUNTER — Other Ambulatory Visit: Payer: Self-pay

## 2016-08-03 NOTE — Patient Outreach (Signed)
[facility]) Care Management  [place]  08/03/2016   Briana Werner 04-Jan-1936 [id number]  Subjective: Telephone call to patient for initial assessment.  Patient reports she is doing ok but expresses concern about her sugars being up. Patient reports she is taking her tuojeo as ordered and [place]. Patient lives with her husband in independent senior housing. She states that 2 meals lunch and dinner are offered per day if they wish to join. 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  Patient has been in the area about 1 year from [place].  Daughter and son-in-law live locally and assist with care as needed.  Daughter fills pill container but patient knowledgeable about her medications. Patient has had some issues with falls and recently was discharged from advanced home care.  Patient reports she continues to do exercises taught in order to remain active and independent. Patient also reports some back issues for which she is not a candidate for surgery but denies pain presently. Discussed with patient fall precautions and utilization of assistive device.  Patient states she uses the walker for ambulation. Patient reports she checks her sugars in the evening and last evening was 215.  Discussed with patient diet exploration and knowing what foods cause her sugar to go up.  She verbalized understanding and states that her main goal is to decrease sugars.  Objective:   Encounter Medications:  Outpatient Encounter Prescriptions as of 08/03/2016  Medication Sig Note  . AMBULATORY NON FORMULARY MEDICATION Diabetic Test Strips and Lancets: One Touch Ultra.   Dx Type 2 Diabetes.e11.21  Use to check blood sugar twice a day.   . AMBULATORY NON FORMULARY MEDICATION Insulin needles for lantus solastar  31g x4   . apixaban (ELIQUIS) 5 MG TABS tablet TAKE 1 TABLET(5 MG) BY MOUTH TWICE DAILY   . atorvastatin (LIPITOR) 40 MG tablet TAKE 1 TABLET(40 MG) BY MOUTH DAILY  AT 6 PM   . Calcium Carbonate-Vitamin D (CALTRATE 600+D PO) Take by mouth 2 (two) times daily.   . carvedilol (COREG) 6.25 MG tablet TAKE 1 TABLET BY MOUTH TWICE DAILY WITH MEALS   . escitalopram (LEXAPRO) 5 MG tablet Take 1 tablet (5 mg total) by mouth daily.   Marland Kitchen FeFum-FePoly-FA-B Cmp-C-Biot (INTEGRA PLUS) CAPS TAKE 1 CAPSULE BY MOUTH DAILY   . Fluticasone-Salmeterol (ADVAIR DISKUS) 500-50 MCG/DOSE AEPB Inhale 1 puff into the lungs 2 (two) times daily.   Marland Kitchen gabapentin (NEURONTIN) 300 MG capsule TAKE 1 CAPSULE(300 MG) BY MOUTH THREE TIMES DAILY   . Insulin Glargine (TOUJEO SOLOSTAR) 300 UNIT/ML SOPN Inject 38 Units into the skin daily.   . IRON PO Take by mouth.   . levothyroxine (SYNTHROID, LEVOTHROID) 88 MCG tablet TAKE 1 TABLET BY MOUTH DAILY BEFORE BREAKFAST   . lisinopril (PRINIVIL,ZESTRIL) 10 MG tablet TAKE 1 TABLET(10 MG) BY MOUTH DAILY   . montelukast (SINGULAIR) 10 MG tablet TAKE 1 TABLET BY MOUTH AT BEDTIME   . Multiple Vitamin (MULTIVITAMIN) capsule Take 1 capsule by mouth. 06/26/2014: Received from: [facility]  . ONE TOUCH ULTRA TEST test strip CHECK BLOOD GLUCOSE TWICE DAILY   . pantoprazole (PROTONIX) 40 MG tablet TAKE 1 TABLET BY MOUTH DAILY   . PROVENTIL HFA 108 (90 Base) MCG/ACT inhaler INHALE 2 PUFFS BY MOUTH EVERY 4 TO 6 HOURS AS NEEDED FOR SHORTNESS OF BREATH OR WHEEZING   . sitaGLIPtin (JANUVIA) 50 MG tablet Take 1 tablet (50 mg total) by mouth daily.   . VESICARE 5  MG tablet TK 1 T PO D 06/17/2016: Received from: External Pharmacy  . oxyCODONE-acetaminophen (ROXICET) 5-325 MG tablet Take 1 tablet by mouth at bedtime as needed for severe pain. (Patient not taking: Reported on 08/03/2016)    No facility-administered encounter medications on file as of 08/03/2016.     Functional Status:  In your present state of health, do you have any difficulty performing the following activities: 08/03/2016 07/22/2016  Hearing? N N  Vision? N Y  Difficulty concentrating or making  decisions? Tempie Donning  Walking or climbing stairs? Y Y  Dressing or bathing? N N  Doing errands, shopping? N N  Preparing Food and eating ? N -  Using the Toilet? N -  In the past six months, have you accidently leaked urine? Y -  Do you have problems with loss of bowel control? N -  Managing your Medications? Y -  Managing your Finances? Y -  Housekeeping or managing your Housekeeping? Y -  Some recent data might be hidden    Fall/Depression Screening: PHQ 2/9 Scores 08/03/2016 07/22/2016 05/25/2016 02/26/2014  PHQ - 2 Score 0 0 0 2  PHQ- 9 Score - - - 9    Assessment: Patient will benefit from health coach outreach for education and support in disease management.   Plan:  [facility] CM Care Plan Problem One   Flowsheet Row Most Recent Value  Care Plan Problem One  Knowledge deficit diabetes as evidenced by patient  questions about diet  Role Documenting the Problem One  [place] for Problem One  Active  THN Long Term Goal (31-90 days)  Patient will verbalize sugars less than 200 on consistent basis within the next 90 days.   THN Long Term Goal Start Date  08/03/16  Interventions for Problem One [place] discussed with patient exploring diet and noting foods she eats that increases her sugar.    THN CM Short Term Goal #1 (0-30 days)  Patient will verbalize at least 4 foods high in carbohydrates within 30 days.   THN CM Short Term Goal #1 Start Date  08/03/16  Interventions for Short Term Goal #1  RN Health Coach discussed with patient foods high in carbohydrates such as potatoes, breads, rice, and pastas.  THN CM Short Term Goal #2 (0-30 days)  Patient will report continued exercise at least 3 days a week within 30 days.   THN CM Short Term Goal #2 Start Date  08/03/16  Interventions for Short Term Goal #2  [place] discussed with patient importance of exercise and controlling diabetes.     RN Health Coach will provide ongoing education for patient  on diabetes through phone calls and sending printed information to patient for further discussion.  RN Health Coach will send welcome packet with consent to patient as well as printed information on diabetes.  RN Health Coach will send initial barriers letter, assessment, and care plan to primary care physician. RN Health Coach will contact patient in the month of December and patient agrees to next contact.   Jone Baseman, RN, MSN [place] [phone]

## 2016-08-04 ENCOUNTER — Other Ambulatory Visit: Payer: Self-pay

## 2016-08-04 MED ORDER — CARVEDILOL 6.25 MG PO TABS: 6.2500 mg | ORAL_TABLET | Freq: Two times a day (BID) | ORAL | 1 refills | Status: DC

## 2016-08-12 ENCOUNTER — Other Ambulatory Visit: Payer: Self-pay | Admitting: Family Medicine

## 2016-08-13 ENCOUNTER — Telehealth: Payer: Self-pay

## 2016-08-13 ENCOUNTER — Other Ambulatory Visit: Payer: Self-pay | Admitting: Family Medicine

## 2016-08-13 DIAGNOSIS — B029 Zoster without complications: Secondary | ICD-10-CM

## 2016-08-13 DIAGNOSIS — E1121 Type 2 diabetes mellitus with diabetic nephropathy: Secondary | ICD-10-CM

## 2016-08-13 MED ORDER — INSULIN GLARGINE 300 UNIT/ML ~~LOC~~ SOPN: 38.0000 [IU] | PEN_INJECTOR | Freq: Every day | SUBCUTANEOUS | 2 refills | Status: DC

## 2016-08-13 NOTE — Telephone Encounter (Signed)
Patient request refill for Insulin. Refill has been sent electronically to pharmacy. Rhonda Cunningham,CMA

## 2016-08-16 ENCOUNTER — Encounter: Payer: Self-pay | Admitting: Family Medicine

## 2016-08-16 ENCOUNTER — Ambulatory Visit (INDEPENDENT_AMBULATORY_CARE_PROVIDER_SITE_OTHER): Payer: Medicare Other | Admitting: Family Medicine

## 2016-08-16 VITALS — BP 112/50 | HR 80 | Temp 98.2°F

## 2016-08-16 DIAGNOSIS — R05 Cough: Secondary | ICD-10-CM | POA: Diagnosis not present

## 2016-08-16 DIAGNOSIS — R059 Cough, unspecified: Secondary | ICD-10-CM

## 2016-08-16 DIAGNOSIS — N3001 Acute cystitis with hematuria: Secondary | ICD-10-CM

## 2016-08-16 DIAGNOSIS — I701 Atherosclerosis of renal artery: Secondary | ICD-10-CM

## 2016-08-16 DIAGNOSIS — J449 Chronic obstructive pulmonary disease, unspecified: Secondary | ICD-10-CM

## 2016-08-16 DIAGNOSIS — R300 Dysuria: Secondary | ICD-10-CM

## 2016-08-16 LAB — POCT URINALYSIS DIPSTICK
Bilirubin, UA: NEGATIVE
GLUCOSE UA: NEGATIVE
Ketones, UA: NEGATIVE
NITRITE UA: NEGATIVE
PH UA: 5.5
Protein, UA: 100
SPEC GRAV UA: 1.015
UROBILINOGEN UA: 0.2

## 2016-08-16 MED ORDER — CEPHALEXIN 500 MG PO CAPS: 500.0000 mg | ORAL_CAPSULE | Freq: Two times a day (BID) | ORAL | 0 refills | Status: DC

## 2016-08-16 NOTE — Patient Instructions (Signed)
Thank you for coming in today. 1) Increase insulin to 45 units daily.  Check sugar before you eat.  2) Take keflex for UTI.  3) You should hear about pulmonology referral soon,  Recheck in 2-4 weeks.

## 2016-08-16 NOTE — Progress Notes (Signed)
Briana Werner is a 80 y.o. female who presents to [place]: [place] today for follow-up cough, diabetes and discuss new urinary tract infection.  Urinary tract infection: Patient has a two-week history of urinary frequency urgency and dysuria associated with foul-smelling urine. She denies significant abdominal pain fevers or chills. Her current symptoms are consistent with previous UTI. She has not tried treatment yet.  Diabetes: Not well-controlled. Patient notes non-fasting glucoses are typically in the 200-400 range. She denies severe polyuria or polydipsia. She currently is using 38 units of insulin daily.  Cough: Patient notes continued chronic cough. She uses the medications listed below. She had recent swallow study that suggested small amount of aspiration is possible. She notes cough is still very bothersome.    Past Medical History:  Diagnosis Date  . Asthma   . Atrial flutter ([place])   . CAD (coronary artery disease)   . Cerebrovascular disease   . Chronic renal insufficiency 02/26/2014  . COPD (chronic obstructive pulmonary disease) ([place]) 02/26/2014  . Hypothyroid 02/26/2014  . Presence of stent in coronary artery in patient with coronary artery disease 02/26/2014  . Renal artery stenosis ([place])   . Type 2 diabetes mellitus ([place]) 02/26/2014   Past Surgical History:  Procedure Laterality Date  . ABDOMINAL HYSTERECTOMY    . APPENDECTOMY    . BACK SURGERY    . CHOLECYSTECTOMY    . CORONARY ARTERY BYPASS GRAFT     2004, [place]  . Hip replacement    . KNEE SURGERY Bilateral   . NASAL SINUS SURGERY    . SHOULDER SURGERY    . TONSILLECTOMY     Social History  Substance Use Topics  . Smoking status: Never Smoker  . Smokeless tobacco: Never Used  . Alcohol use No   family history includes CAD in her father; Heart attack in her father.  ROS as  above:  Medications: Current Outpatient Prescriptions  Medication Sig Dispense Refill  . AMBULATORY NON FORMULARY MEDICATION Diabetic Test Strips and Lancets: One Touch Ultra.   Dx Type 2 Diabetes.e11.21  Use to check blood sugar twice a day. 100 Units 3  . AMBULATORY NON FORMULARY MEDICATION Insulin needles for lantus solastar  31g x4 100 each 11  . apixaban (ELIQUIS) 5 MG TABS tablet TAKE 1 TABLET(5 MG) BY MOUTH TWICE DAILY 60 tablet 2  . atorvastatin (LIPITOR) 40 MG tablet TAKE 1 TABLET(40 MG) BY MOUTH DAILY AT 6 PM 90 tablet 0  . Calcium Carbonate-Vitamin D (CALTRATE 600+D PO) Take by mouth 2 (two) times daily.    . carvedilol (COREG) 6.25 MG tablet Take 1 tablet (6.25 mg total) by mouth 2 (two) times daily with a meal. 60 tablet 1  . cephALEXin (KEFLEX) 500 MG capsule Take 1 capsule (500 mg total) by mouth 2 (two) times daily. 14 capsule 0  . escitalopram (LEXAPRO) 5 MG tablet Take 1 tablet (5 mg total) by mouth daily. 90 tablet 0  . FeFum-FePoly-FA-B Cmp-C-Biot (INTEGRA PLUS) CAPS TAKE 1 CAPSULE BY MOUTH DAILY 30 capsule 0  . Fluticasone-Salmeterol (ADVAIR DISKUS) 500-50 MCG/DOSE AEPB Inhale 1 puff into the lungs 2 (two) times daily. 60 each 6  . gabapentin (NEURONTIN) 300 MG capsule TAKE 1 CAPSULE(300 MG) BY MOUTH THREE TIMES DAILY 90 capsule 0  . Insulin Glargine (TOUJEO SOLOSTAR) 300 UNIT/ML SOPN Inject 38 Units into the skin daily. 4.5 mL 2  . IRON PO Take by mouth.    Marland Kitchen  levothyroxine (SYNTHROID, LEVOTHROID) 88 MCG tablet TAKE 1 TABLET BY MOUTH DAILY BEFORE BREAKFAST 90 tablet 0  . lisinopril (PRINIVIL,ZESTRIL) 10 MG tablet TAKE 1 TABLET(10 MG) BY MOUTH DAILY 90 tablet 0  . montelukast (SINGULAIR) 10 MG tablet TAKE 1 TABLET BY MOUTH AT BEDTIME 30 tablet 0  . Multiple Vitamin (MULTIVITAMIN) capsule Take 1 capsule by mouth.    . ONE TOUCH ULTRA TEST test strip CHECK BLOOD GLUCOSE TWICE DAILY 100 each 1  . oxyCODONE-acetaminophen (ROXICET) 5-325 MG tablet Take 1 tablet by mouth at  bedtime as needed for severe pain. (Patient not taking: Reported on 08/03/2016) 30 tablet 0  . pantoprazole (PROTONIX) 40 MG tablet TAKE 1 TABLET BY MOUTH DAILY 90 tablet 0  . PROVENTIL HFA 108 (90 Base) MCG/ACT inhaler INHALE 2 PUFFS BY MOUTH EVERY 4 TO 6 HOURS AS NEEDED FOR SHORTNESS OF BREATH OR WHEEZING 6.7 g 0  . sitaGLIPtin (JANUVIA) 50 MG tablet Take 1 tablet (50 mg total) by mouth daily. 90 tablet 0  . VESICARE 5 MG tablet TK 1 T PO D  11   No current facility-administered medications for this visit.    Allergies  Allergen Reactions  . Tetracycline Shortness Of Breath  . Tetracyclines & Related Anaphylaxis and Shortness Of Breath    dizziness  . Other Dermatitis    Bandaids   . Amlodipine     Edema  . Amoxicillin     Questionable rash  . Bactrim [Sulfamethoxazole-Trimethoprim] Other (See Comments)    dizzy dizzy  . Latex Dermatitis    Bandages, pulls skin  . Tape Dermatitis    Bandaids  Bandaids     Health Maintenance Health Maintenance  Topic Date Due  . OPHTHALMOLOGY EXAM  07/16/1948  . ZOSTAVAX  07/16/1998  . DEXA SCAN  07/17/2003  . HEMOGLOBIN A1C  11/22/2016  . FOOT EXAM  05/25/2017  . PNA vac Low Risk Adult (2 of 2 - PPSV23) 05/25/2017  . TETANUS/TDAP  05/25/2026  . INFLUENZA VACCINE  Completed     Exam:  BP (!) 112/50   Pulse 80   Temp 98.2 F (36.8 C) (Oral)   SpO2 93%  Gen: Well NAD HEENT: EOMI,  MMM Lungs: Normal work of breathing. CTABL Heart: RRR no MRG Abd: NABS, Soft. Nondistended, Nontender No CVA angle tenderness to percussion  Exts: Brisk capillary refill, warm and well perfused.    Results for orders placed or performed in visit on 08/16/16 (from the past 72 hour(s))  POCT Urinalysis Dipstick     Status: Abnormal   Collection Time: 08/16/16 12:50 PM  Result Value Ref Range   Color, UA dark yellow    Clarity, UA cloudy    Glucose, UA neg    Bilirubin, UA neg    Ketones, UA neg    Spec Grav, UA 1.015    Blood, UA mod    pH,  UA 5.5    Protein, UA 100    Urobilinogen, UA 0.2    Nitrite, UA neg    Leukocytes, UA moderate (2+) (A) Negative   No results found.    Assessment and Plan: 80 y.o. female with  Diabetes: Not well controlled. Increase insulin to 45 units daily. Recheck in about a month.  Urinary tract infection: Positive point-of-care urinalysis. Urine culture pending. Impaired treat with Keflex.  Chronic cough: Unclear etiology referring pulmonology.   Orders Placed This Encounter  Procedures  . Urine Culture  . Ambulatory referral to Pulmonology    Referral  Priority:   Routine    Referral Type:   Consultation    Referral Reason:   Specialty Services Required    Requested Specialty:   Pulmonary Disease    Number of Visits Requested:   1  . POCT Urinalysis Dipstick    Discussed warning signs or symptoms. Please see discharge instructions. Patient expresses understanding.

## 2016-08-19 ENCOUNTER — Other Ambulatory Visit: Payer: Self-pay | Admitting: Family Medicine

## 2016-08-19 LAB — URINE CULTURE

## 2016-08-24 ENCOUNTER — Other Ambulatory Visit: Payer: Self-pay | Admitting: Family Medicine

## 2016-08-25 ENCOUNTER — Other Ambulatory Visit: Payer: Self-pay | Admitting: Family Medicine

## 2016-08-26 NOTE — Telephone Encounter (Signed)
Based on last fill date, I am unsure if pt is still to be rxd these medications. Please advise.

## 2016-08-30 ENCOUNTER — Other Ambulatory Visit: Payer: Self-pay

## 2016-08-30 NOTE — Patient Outreach (Signed)
[facility]) Care Management  08/30/2016  Haadiya Frogge 12/08/1935 [id number]   Telephone call to patient for monthly call.  No answer.  HIPAA compliant voice message left.   Plan: RN Health Coach will attempt patient again in the month of December.    Jone Baseman, RN, MSN [place] [phone]

## 2016-09-02 DIAGNOSIS — E1169 Type 2 diabetes mellitus with other specified complication: Secondary | ICD-10-CM | POA: Diagnosis not present

## 2016-09-02 DIAGNOSIS — J441 Chronic obstructive pulmonary disease with (acute) exacerbation: Secondary | ICD-10-CM | POA: Diagnosis not present

## 2016-09-02 DIAGNOSIS — E782 Mixed hyperlipidemia: Secondary | ICD-10-CM | POA: Diagnosis not present

## 2016-09-02 DIAGNOSIS — E162 Hypoglycemia, unspecified: Secondary | ICD-10-CM | POA: Diagnosis not present

## 2016-09-02 DIAGNOSIS — E1142 Type 2 diabetes mellitus with diabetic polyneuropathy: Secondary | ICD-10-CM | POA: Diagnosis not present

## 2016-09-02 DIAGNOSIS — J984 Other disorders of lung: Secondary | ICD-10-CM | POA: Diagnosis not present

## 2016-09-02 DIAGNOSIS — D500 Iron deficiency anemia secondary to blood loss (chronic): Secondary | ICD-10-CM | POA: Diagnosis not present

## 2016-09-02 DIAGNOSIS — I4892 Unspecified atrial flutter: Secondary | ICD-10-CM | POA: Diagnosis not present

## 2016-09-02 DIAGNOSIS — R7881 Bacteremia: Secondary | ICD-10-CM | POA: Diagnosis not present

## 2016-09-02 DIAGNOSIS — E11649 Type 2 diabetes mellitus with hypoglycemia without coma: Secondary | ICD-10-CM | POA: Diagnosis not present

## 2016-09-02 DIAGNOSIS — I2510 Atherosclerotic heart disease of native coronary artery without angina pectoris: Secondary | ICD-10-CM | POA: Diagnosis not present

## 2016-09-02 DIAGNOSIS — R031 Nonspecific low blood-pressure reading: Secondary | ICD-10-CM | POA: Diagnosis not present

## 2016-09-02 DIAGNOSIS — E039 Hypothyroidism, unspecified: Secondary | ICD-10-CM | POA: Diagnosis not present

## 2016-09-02 DIAGNOSIS — N179 Acute kidney failure, unspecified: Secondary | ICD-10-CM | POA: Diagnosis not present

## 2016-09-02 DIAGNOSIS — R0602 Shortness of breath: Secondary | ICD-10-CM | POA: Diagnosis not present

## 2016-09-02 DIAGNOSIS — I129 Hypertensive chronic kidney disease with stage 1 through stage 4 chronic kidney disease, or unspecified chronic kidney disease: Secondary | ICD-10-CM | POA: Diagnosis not present

## 2016-09-02 DIAGNOSIS — Z7901 Long term (current) use of anticoagulants: Secondary | ICD-10-CM | POA: Diagnosis not present

## 2016-09-02 DIAGNOSIS — E1122 Type 2 diabetes mellitus with diabetic chronic kidney disease: Secondary | ICD-10-CM | POA: Diagnosis not present

## 2016-09-02 DIAGNOSIS — I10 Essential (primary) hypertension: Secondary | ICD-10-CM | POA: Diagnosis not present

## 2016-09-02 DIAGNOSIS — N189 Chronic kidney disease, unspecified: Secondary | ICD-10-CM | POA: Diagnosis not present

## 2016-09-03 ENCOUNTER — Other Ambulatory Visit: Payer: Self-pay

## 2016-09-03 DIAGNOSIS — B952 Enterococcus as the cause of diseases classified elsewhere: Secondary | ICD-10-CM | POA: Diagnosis present

## 2016-09-03 DIAGNOSIS — N139 Obstructive and reflux uropathy, unspecified: Secondary | ICD-10-CM | POA: Diagnosis not present

## 2016-09-03 DIAGNOSIS — Z955 Presence of coronary angioplasty implant and graft: Secondary | ICD-10-CM | POA: Diagnosis not present

## 2016-09-03 DIAGNOSIS — Z951 Presence of aortocoronary bypass graft: Secondary | ICD-10-CM | POA: Diagnosis not present

## 2016-09-03 DIAGNOSIS — I517 Cardiomegaly: Secondary | ICD-10-CM | POA: Diagnosis not present

## 2016-09-03 DIAGNOSIS — Z794 Long term (current) use of insulin: Secondary | ICD-10-CM | POA: Diagnosis not present

## 2016-09-03 DIAGNOSIS — I088 Other rheumatic multiple valve diseases: Secondary | ICD-10-CM | POA: Diagnosis not present

## 2016-09-03 DIAGNOSIS — E1140 Type 2 diabetes mellitus with diabetic neuropathy, unspecified: Secondary | ICD-10-CM | POA: Diagnosis not present

## 2016-09-03 DIAGNOSIS — E1122 Type 2 diabetes mellitus with diabetic chronic kidney disease: Secondary | ICD-10-CM | POA: Diagnosis not present

## 2016-09-03 DIAGNOSIS — Z7901 Long term (current) use of anticoagulants: Secondary | ICD-10-CM | POA: Diagnosis not present

## 2016-09-03 DIAGNOSIS — D508 Other iron deficiency anemias: Secondary | ICD-10-CM | POA: Diagnosis not present

## 2016-09-03 DIAGNOSIS — G309 Alzheimer's disease, unspecified: Secondary | ICD-10-CM | POA: Diagnosis not present

## 2016-09-03 DIAGNOSIS — E782 Mixed hyperlipidemia: Secondary | ICD-10-CM | POA: Diagnosis present

## 2016-09-03 DIAGNOSIS — R0602 Shortness of breath: Secondary | ICD-10-CM | POA: Diagnosis not present

## 2016-09-03 DIAGNOSIS — D500 Iron deficiency anemia secondary to blood loss (chronic): Secondary | ICD-10-CM | POA: Diagnosis present

## 2016-09-03 DIAGNOSIS — E039 Hypothyroidism, unspecified: Secondary | ICD-10-CM | POA: Diagnosis not present

## 2016-09-03 DIAGNOSIS — I2510 Atherosclerotic heart disease of native coronary artery without angina pectoris: Secondary | ICD-10-CM | POA: Diagnosis not present

## 2016-09-03 DIAGNOSIS — E1142 Type 2 diabetes mellitus with diabetic polyneuropathy: Secondary | ICD-10-CM | POA: Diagnosis not present

## 2016-09-03 DIAGNOSIS — E162 Hypoglycemia, unspecified: Secondary | ICD-10-CM | POA: Diagnosis not present

## 2016-09-03 DIAGNOSIS — E11649 Type 2 diabetes mellitus with hypoglycemia without coma: Secondary | ICD-10-CM | POA: Diagnosis present

## 2016-09-03 DIAGNOSIS — I10 Essential (primary) hypertension: Secondary | ICD-10-CM | POA: Diagnosis not present

## 2016-09-03 DIAGNOSIS — Z91040 Latex allergy status: Secondary | ICD-10-CM | POA: Diagnosis not present

## 2016-09-03 DIAGNOSIS — Z9981 Dependence on supplemental oxygen: Secondary | ICD-10-CM | POA: Diagnosis not present

## 2016-09-03 DIAGNOSIS — Z880 Allergy status to penicillin: Secondary | ICD-10-CM | POA: Diagnosis not present

## 2016-09-03 DIAGNOSIS — R7881 Bacteremia: Secondary | ICD-10-CM | POA: Diagnosis not present

## 2016-09-03 DIAGNOSIS — Z452 Encounter for adjustment and management of vascular access device: Secondary | ICD-10-CM | POA: Diagnosis not present

## 2016-09-03 DIAGNOSIS — F0280 Dementia in other diseases classified elsewhere without behavioral disturbance: Secondary | ICD-10-CM | POA: Diagnosis present

## 2016-09-03 DIAGNOSIS — R938 Abnormal findings on diagnostic imaging of other specified body structures: Secondary | ICD-10-CM | POA: Diagnosis not present

## 2016-09-03 DIAGNOSIS — N200 Calculus of kidney: Secondary | ICD-10-CM | POA: Diagnosis not present

## 2016-09-03 DIAGNOSIS — I129 Hypertensive chronic kidney disease with stage 1 through stage 4 chronic kidney disease, or unspecified chronic kidney disease: Secondary | ICD-10-CM | POA: Diagnosis present

## 2016-09-03 DIAGNOSIS — E1169 Type 2 diabetes mellitus with other specified complication: Secondary | ICD-10-CM | POA: Diagnosis not present

## 2016-09-03 DIAGNOSIS — N3281 Overactive bladder: Secondary | ICD-10-CM | POA: Diagnosis present

## 2016-09-03 DIAGNOSIS — Z436 Encounter for attention to other artificial openings of urinary tract: Secondary | ICD-10-CM | POA: Diagnosis not present

## 2016-09-03 DIAGNOSIS — N179 Acute kidney failure, unspecified: Secondary | ICD-10-CM | POA: Diagnosis not present

## 2016-09-03 DIAGNOSIS — I4892 Unspecified atrial flutter: Secondary | ICD-10-CM | POA: Diagnosis not present

## 2016-09-03 DIAGNOSIS — G301 Alzheimer's disease with late onset: Secondary | ICD-10-CM | POA: Diagnosis present

## 2016-09-03 DIAGNOSIS — N183 Chronic kidney disease, stage 3 (moderate): Secondary | ICD-10-CM | POA: Diagnosis not present

## 2016-09-03 DIAGNOSIS — J441 Chronic obstructive pulmonary disease with (acute) exacerbation: Secondary | ICD-10-CM | POA: Diagnosis not present

## 2016-09-03 NOTE — Patient Outreach (Signed)
[facility] Care Management  09/03/2016  Briana Werner 1936-01-11 [id number]   2nd Telephone call to patient for monthly call.  No answer. HIPAA compliant voice message left.   Plan: RN Health Coach will attempt patient in the month of January.    Jone Baseman, RN, MSN [place] [phone]

## 2016-09-08 DIAGNOSIS — D509 Iron deficiency anemia, unspecified: Secondary | ICD-10-CM | POA: Diagnosis not present

## 2016-09-08 DIAGNOSIS — N200 Calculus of kidney: Secondary | ICD-10-CM | POA: Diagnosis not present

## 2016-09-08 DIAGNOSIS — N183 Chronic kidney disease, stage 3 (moderate): Secondary | ICD-10-CM | POA: Diagnosis not present

## 2016-09-08 DIAGNOSIS — E1122 Type 2 diabetes mellitus with diabetic chronic kidney disease: Secondary | ICD-10-CM | POA: Diagnosis not present

## 2016-09-08 DIAGNOSIS — E039 Hypothyroidism, unspecified: Secondary | ICD-10-CM | POA: Diagnosis not present

## 2016-09-08 DIAGNOSIS — E119 Type 2 diabetes mellitus without complications: Secondary | ICD-10-CM | POA: Diagnosis not present

## 2016-09-08 DIAGNOSIS — Z794 Long term (current) use of insulin: Secondary | ICD-10-CM | POA: Diagnosis not present

## 2016-09-08 DIAGNOSIS — F0280 Dementia in other diseases classified elsewhere without behavioral disturbance: Secondary | ICD-10-CM | POA: Diagnosis not present

## 2016-09-08 DIAGNOSIS — J449 Chronic obstructive pulmonary disease, unspecified: Secondary | ICD-10-CM | POA: Diagnosis not present

## 2016-09-08 DIAGNOSIS — E1140 Type 2 diabetes mellitus with diabetic neuropathy, unspecified: Secondary | ICD-10-CM | POA: Diagnosis not present

## 2016-09-08 DIAGNOSIS — B952 Enterococcus as the cause of diseases classified elsewhere: Secondary | ICD-10-CM | POA: Diagnosis not present

## 2016-09-08 DIAGNOSIS — I4891 Unspecified atrial fibrillation: Secondary | ICD-10-CM | POA: Diagnosis not present

## 2016-09-08 DIAGNOSIS — Z436 Encounter for attention to other artificial openings of urinary tract: Secondary | ICD-10-CM | POA: Diagnosis not present

## 2016-09-08 DIAGNOSIS — I2510 Atherosclerotic heart disease of native coronary artery without angina pectoris: Secondary | ICD-10-CM | POA: Diagnosis not present

## 2016-09-08 DIAGNOSIS — Z9981 Dependence on supplemental oxygen: Secondary | ICD-10-CM | POA: Diagnosis not present

## 2016-09-08 DIAGNOSIS — R7881 Bacteremia: Secondary | ICD-10-CM | POA: Diagnosis not present

## 2016-09-08 DIAGNOSIS — J441 Chronic obstructive pulmonary disease with (acute) exacerbation: Secondary | ICD-10-CM | POA: Diagnosis not present

## 2016-09-08 DIAGNOSIS — I129 Hypertensive chronic kidney disease with stage 1 through stage 4 chronic kidney disease, or unspecified chronic kidney disease: Secondary | ICD-10-CM | POA: Diagnosis not present

## 2016-09-08 DIAGNOSIS — I4892 Unspecified atrial flutter: Secondary | ICD-10-CM | POA: Diagnosis not present

## 2016-09-08 DIAGNOSIS — Z951 Presence of aortocoronary bypass graft: Secondary | ICD-10-CM | POA: Diagnosis not present

## 2016-09-08 DIAGNOSIS — N179 Acute kidney failure, unspecified: Secondary | ICD-10-CM | POA: Diagnosis not present

## 2016-09-08 DIAGNOSIS — G309 Alzheimer's disease, unspecified: Secondary | ICD-10-CM | POA: Diagnosis not present

## 2016-09-08 DIAGNOSIS — D508 Other iron deficiency anemias: Secondary | ICD-10-CM | POA: Diagnosis not present

## 2016-09-08 DIAGNOSIS — E782 Mixed hyperlipidemia: Secondary | ICD-10-CM | POA: Diagnosis not present

## 2016-09-08 NOTE — Addendum Note (Signed)
Encounter addended by: Eloise Harman, CCC-SLP on: 09/08/2016  9:10 AM<BR>    Actions taken: Flowsheet data copied forward, Sign clinical note, Flowsheet accepted

## 2016-09-08 NOTE — Progress Notes (Signed)
   07/26/16 1701  Clinical Impression  Therapy Diagnosis Mild oral phase dysphagia (visit diagnosis-dysphagia oral phase (R13.11))  Clinical Impression Pt presents with mild oral dysphagia characterized by mild oral holding/hesitation with swallowing  - most notably with liquids.  SLP questions if pt is compensating for occasional "choking" that occurs with liquids.  NO aspiration/penetration with all consistencies tested - including thin, nectar, puree, cracker and mixed *tablet with thin.  Pt's pharyngeal swallow was overall strong without residuals.  Unfortunately pt did not demonstrate symptoms during test (no cough observed).  Did advise pt to consider chin tuck posture if cough with thin is consistent and posture effectively abates symptoms, although this was not tested under flouro.  Using live video, educated pt to findings and recommendations.  Thanks for this referral.    Impact on safety and function Mild aspiration risk  Gabriel Rainwater MA, CCC-SLP [phone]

## 2016-09-13 DIAGNOSIS — E039 Hypothyroidism, unspecified: Secondary | ICD-10-CM | POA: Diagnosis not present

## 2016-09-13 DIAGNOSIS — R7881 Bacteremia: Secondary | ICD-10-CM | POA: Diagnosis not present

## 2016-09-13 DIAGNOSIS — D509 Iron deficiency anemia, unspecified: Secondary | ICD-10-CM | POA: Diagnosis not present

## 2016-09-13 DIAGNOSIS — I4891 Unspecified atrial fibrillation: Secondary | ICD-10-CM | POA: Diagnosis not present

## 2016-09-13 DIAGNOSIS — J449 Chronic obstructive pulmonary disease, unspecified: Secondary | ICD-10-CM | POA: Diagnosis not present

## 2016-09-13 DIAGNOSIS — E119 Type 2 diabetes mellitus without complications: Secondary | ICD-10-CM | POA: Diagnosis not present

## 2016-09-15 DIAGNOSIS — E119 Type 2 diabetes mellitus without complications: Secondary | ICD-10-CM | POA: Diagnosis not present

## 2016-09-15 DIAGNOSIS — J449 Chronic obstructive pulmonary disease, unspecified: Secondary | ICD-10-CM | POA: Diagnosis not present

## 2016-09-15 DIAGNOSIS — I4891 Unspecified atrial fibrillation: Secondary | ICD-10-CM | POA: Diagnosis not present

## 2016-09-15 DIAGNOSIS — I2510 Atherosclerotic heart disease of native coronary artery without angina pectoris: Secondary | ICD-10-CM | POA: Diagnosis not present

## 2016-09-23 DIAGNOSIS — B372 Candidiasis of skin and nail: Secondary | ICD-10-CM | POA: Diagnosis not present

## 2016-09-23 DIAGNOSIS — B370 Candidal stomatitis: Secondary | ICD-10-CM | POA: Diagnosis not present

## 2016-09-23 DIAGNOSIS — R7881 Bacteremia: Secondary | ICD-10-CM | POA: Diagnosis not present

## 2016-09-23 DIAGNOSIS — I10 Essential (primary) hypertension: Secondary | ICD-10-CM | POA: Diagnosis not present

## 2016-09-23 DIAGNOSIS — I4892 Unspecified atrial flutter: Secondary | ICD-10-CM | POA: Diagnosis not present

## 2016-09-23 DIAGNOSIS — E1122 Type 2 diabetes mellitus with diabetic chronic kidney disease: Secondary | ICD-10-CM | POA: Diagnosis not present

## 2016-09-23 DIAGNOSIS — N183 Chronic kidney disease, stage 3 (moderate): Secondary | ICD-10-CM | POA: Diagnosis not present

## 2016-09-27 ENCOUNTER — Ambulatory Visit: Payer: Medicare Other | Admitting: Family Medicine

## 2016-09-27 ENCOUNTER — Other Ambulatory Visit: Payer: Self-pay | Admitting: Family Medicine

## 2016-09-28 ENCOUNTER — Other Ambulatory Visit: Payer: Self-pay | Admitting: Family Medicine

## 2016-09-28 ENCOUNTER — Encounter: Payer: Self-pay | Admitting: Osteopathic Medicine

## 2016-09-28 ENCOUNTER — Ambulatory Visit (INDEPENDENT_AMBULATORY_CARE_PROVIDER_SITE_OTHER): Payer: Medicare Other | Admitting: Osteopathic Medicine

## 2016-09-28 VITALS — BP 149/67 | HR 98 | Ht 68.0 in | Wt 166.0 lb

## 2016-09-28 DIAGNOSIS — I482 Chronic atrial fibrillation, unspecified: Secondary | ICD-10-CM

## 2016-09-28 DIAGNOSIS — I2583 Coronary atherosclerosis due to lipid rich plaque: Secondary | ICD-10-CM | POA: Diagnosis not present

## 2016-09-28 DIAGNOSIS — E119 Type 2 diabetes mellitus without complications: Secondary | ICD-10-CM

## 2016-09-28 DIAGNOSIS — B029 Zoster without complications: Secondary | ICD-10-CM

## 2016-09-28 DIAGNOSIS — I2510 Atherosclerotic heart disease of native coronary artery without angina pectoris: Secondary | ICD-10-CM | POA: Diagnosis not present

## 2016-09-28 DIAGNOSIS — I10 Essential (primary) hypertension: Secondary | ICD-10-CM

## 2016-09-28 DIAGNOSIS — E039 Hypothyroidism, unspecified: Secondary | ICD-10-CM | POA: Diagnosis not present

## 2016-09-28 DIAGNOSIS — Z794 Long term (current) use of insulin: Secondary | ICD-10-CM

## 2016-09-28 DIAGNOSIS — Z09 Encounter for follow-up examination after completed treatment for conditions other than malignant neoplasm: Secondary | ICD-10-CM | POA: Diagnosis not present

## 2016-09-28 DIAGNOSIS — J449 Chronic obstructive pulmonary disease, unspecified: Secondary | ICD-10-CM

## 2016-09-28 DIAGNOSIS — N3281 Overactive bladder: Secondary | ICD-10-CM

## 2016-09-28 DIAGNOSIS — D649 Anemia, unspecified: Secondary | ICD-10-CM

## 2016-09-28 DIAGNOSIS — N183 Chronic kidney disease, stage 3 unspecified: Secondary | ICD-10-CM

## 2016-09-28 DIAGNOSIS — E1122 Type 2 diabetes mellitus with diabetic chronic kidney disease: Secondary | ICD-10-CM

## 2016-09-28 DIAGNOSIS — R682 Dry mouth, unspecified: Secondary | ICD-10-CM

## 2016-09-28 DIAGNOSIS — M5416 Radiculopathy, lumbar region: Secondary | ICD-10-CM

## 2016-09-28 DIAGNOSIS — I700 Atherosclerosis of aorta: Secondary | ICD-10-CM

## 2016-09-28 MED ORDER — OXYCODONE-ACETAMINOPHEN 5-325 MG PO TABS: 1.0000 | ORAL_TABLET | Freq: Two times a day (BID) | ORAL | 0 refills | Status: DC | PRN

## 2016-09-28 NOTE — Patient Instructions (Addendum)
Plan:  Diabetes:  Fasting sugars in the morning  Goal: 80-120 for fasting levels  Increase Insulin (Toujeo) by 3-5 Units twice per week until fasting sugars are at goal  Plan to follow-up for Diabetes checkup in 1 month  Dry Mouth:   try hard candies/gum to increase salivation

## 2016-09-28 NOTE — Progress Notes (Signed)
HPI: Briana Werner is a 81 y.o. female  who presents to [facility] today, 09/28/16,  for chief complaint of:  Chief Complaint  Patient presents with  . Other    HOSPITAL FOLLOW UP      Back pain - requests refill of medication to take as needed for pain. She has had to strain herself a bit more than usual since her husband has been feeling il as ewll.   UTI - recent hospitalization for sepsis/UTI. Has had followup with ID and was cleared from their standpoint. Pt is having no symptoms of urinary frequency/urgency, dysuria. No weakness/.   Asthma/COPD - well controlled on current medication. Hospital notes document concern for COPD exacerbation as one of admitting diagnoses.   Mouth - recently prescribed nystatin medication, doesn't seem to be helping. Reports painful bumps on tongue and behind teeth. ID notes mention thinks this is not likely thrush but Rx Nystatin anyway though dry mouth was more of a concern. Pt is on anticholinergic Vesicare   Hypothyroid: due for lab rechek, no palpitations/chest pain, no hair changes, (+)dry skin  Cardio: multiple cardiac issues, following with cardiologist and no complaints of chest pain/pressure or palpitations at this time. Hx Afib/flutter, anticoagulated.    Diabetes: measuring sugars in the evening, she has decreased her insulin to 12 units daily, down from 38. No fasting home sugars to report. She was not sure what she was supposed to be using while out of the hospital. [facility] med list states Toujeo 38 units daily.       Past medical, surgical, social and family history reviewed: Patient Active Problem List   Diagnosis Date Noted  . Cough 07/13/2016  . Tinea pedis 05/25/2016  . Diabetic foot ulcer ([place]) 05/25/2016  . Left hip pain 04/15/2016  . Lumbar radicular pain 01/15/2016  . Aortic atherosclerosis ([place]) 01/12/2016  . Anemia 02/16/2015  . Atrial fibrillation ([place]) 12/25/2014  .  Diabetes mellitus with kidney complication ([place]) 35/24/8185  . Spondylisthesis 05/31/2014  . CAD (coronary artery disease) 05/01/2014  . Renal artery stenosis ([place]) 05/01/2014  . Generalized anxiety disorder 04/03/2014  . CKD (chronic kidney disease) stage 3, GFR 30-59 ml/min 03/26/2014  . Chronic atrial flutter ([place]) 03/19/2014  . Carotid artery stenosis 03/19/2014  . Hypercalcemia 02/27/2014  . Essential hypertension, benign 02/26/2014  . Hypothyroid 02/26/2014  . Hyperlipidemia 02/26/2014  . COPD (chronic obstructive pulmonary disease) ([place]) 02/26/2014  . Dementia 02/26/2014  . Presence of stent in coronary artery in patient with coronary artery disease 02/26/2014  . Nephrolithiasis 02/26/2014  . Diabetic peripheral neuropathy ([place]) 02/26/2014  . DVT, lower extremity, recurrent ([place]) 02/26/2014  . Status post left hip replacement 02/26/2014  . Overactive bladder 02/26/2014   Past Surgical History:  Procedure Laterality Date  . ABDOMINAL HYSTERECTOMY    . APPENDECTOMY    . BACK SURGERY    . CHOLECYSTECTOMY    . CORONARY ARTERY BYPASS GRAFT     2004, [place]  . Hip replacement    . KNEE SURGERY Bilateral   . NASAL SINUS SURGERY    . SHOULDER SURGERY    . TONSILLECTOMY     Social History  Substance Use Topics  . Smoking status: Never Smoker  . Smokeless tobacco: Never Used  . Alcohol use No   Family History  Problem Relation Age of Onset  . CAD Father   . Heart attack Father      Current medication list and allergy/intolerance information reviewed:  Current Outpatient Prescriptions on File Prior to Visit  Medication Sig Dispense Refill  . AMBULATORY NON FORMULARY MEDICATION Diabetic Test Strips and Lancets: One Touch Ultra.   Dx Type 2 Diabetes.e11.21  Use to check blood sugar twice a day. 100 Units 3  . AMBULATORY NON FORMULARY MEDICATION Insulin needles for lantus solastar  31g x4 100 each 11  . atorvastatin (LIPITOR) 40 MG tablet TAKE 1 TABLET(40 MG) BY  MOUTH DAILY AT 6 PM 90 tablet 0  . Calcium Carbonate-Vitamin D (CALTRATE 600+D PO) Take by mouth 2 (two) times daily.    . carvedilol (COREG) 6.25 MG tablet Take 1 tablet (6.25 mg total) by mouth 2 (two) times daily with a meal. 60 tablet 1  . carvedilol (COREG) 6.25 MG tablet TAKE 1 TABLET BY MOUTH TWICE DAILY WITH MEALS 180 tablet 0  . cephALEXin (KEFLEX) 500 MG capsule Take 1 capsule (500 mg total) by mouth 2 (two) times daily. 14 capsule 0  . ELIQUIS 5 MG TABS tablet TAKE 1 TABLET(5 MG) BY MOUTH TWICE DAILY 60 tablet 0  . escitalopram (LEXAPRO) 5 MG tablet TAKE 1 TABLET BY MOUTH DAILY 90 tablet 0  . FeFum-FePoly-FA-B Cmp-C-Biot (INTEGRA PLUS) CAPS TAKE 1 CAPSULE BY MOUTH DAILY 90 capsule 0  . Fluticasone-Salmeterol (ADVAIR DISKUS) 500-50 MCG/DOSE AEPB Inhale 1 puff into the lungs 2 (two) times daily. 60 each 6  . gabapentin (NEURONTIN) 300 MG capsule TAKE 1 CAPSULE(300 MG) BY MOUTH THREE TIMES DAILY 90 capsule 0  . Insulin Glargine (TOUJEO SOLOSTAR) 300 UNIT/ML SOPN Inject 38 Units into the skin daily. 4.5 mL 2  . IRON PO Take by mouth.    Marland Kitchen JANUVIA 50 MG tablet TAKE 1 TABLET BY MOUTH EVERY DAY 90 tablet 0  . levothyroxine (SYNTHROID, LEVOTHROID) 88 MCG tablet TAKE 1 TABLET BY MOUTH DAILY BEFORE BREAKFAST 90 tablet 0  . levothyroxine (SYNTHROID, LEVOTHROID) 88 MCG tablet TAKE 1 TABLET BY MOUTH DAILY BEFORE BREAKFAST 90 tablet 0  . lisinopril (PRINIVIL,ZESTRIL) 10 MG tablet TAKE 1 TABLET(10 MG) BY MOUTH DAILY 90 tablet 0  . montelukast (SINGULAIR) 10 MG tablet TAKE 1 TABLET BY MOUTH AT BEDTIME 30 tablet 0  . Multiple Vitamin (MULTIVITAMIN) capsule Take 1 capsule by mouth.    . ONE TOUCH ULTRA TEST test strip CHECK BLOOD GLUCOSE TWICE DAILY 100 each 1  . oxyCODONE-acetaminophen (ROXICET) 5-325 MG tablet Take 1 tablet by mouth at bedtime as needed for severe pain. 30 tablet 0  . pantoprazole (PROTONIX) 40 MG tablet TAKE 1 TABLET BY MOUTH DAILY 90 tablet 0  . PROVENTIL HFA 108 (90 Base) MCG/ACT  inhaler INHALE 2 PUFFS BY MOUTH EVERY 4 TO 6 HOURS AS NEEDED FOR SHORTNESS OF BREATH OR WHEEZING 6.7 g 0  . VESICARE 5 MG tablet TK 1 T PO D  11   No current facility-administered medications on file prior to visit.    Allergies  Allergen Reactions  . Tetracycline Shortness Of Breath  . Tetracyclines & Related Anaphylaxis and Shortness Of Breath    dizziness  . Other Dermatitis    Bandaids   . Amlodipine     Edema  . Amoxicillin     Questionable rash  . Bactrim [Sulfamethoxazole-Trimethoprim] Other (See Comments)    dizzy dizzy  . Latex Dermatitis    Bandages, pulls skin  . Tape Dermatitis    Bandaids  Bandaids       Review of Systems:  Constitutional: No recent illness  HEENT: No  headache, no vision  change, +mouth discomfor as per HPI  Cardiac: No  chest pain, No  pressure, No palpitations  Respiratory:  No  shortness of breath. No  Cough  Gastrointestinal: No  abdominal pain, no change on bowel habits  Musculoskeletal: +new myalgia/arthralgia  Neurologic: No  weakness, No  Dizziness  Psychiatric: No  concerns with depression, No  concerns with anxiety  Exam:  BP (!) 149/67   Pulse 98   Ht _0  (1.727 m)   Wt 166 lb (75.3 kg)   BMI 25.24 kg/m   Constitutional: VS see above. General Appearance: alert, well-developed, well-nourished, NAD  Eyes: Normal lids and conjunctive, non-icteric sclera  Ears, Nose, Mouth, Throat: MMM, Normal external inspection ears/nares/mouth/lips/gums.  Neck: No masses, trachea midline.   Respiratory: Normal respiratory effort. no wheeze, no rhonchi, no rales  Cardiovascular: S1/S2 normal, no murmur, no rub/gallop auscultated. RRR. (?paroxysmal afib?)   Musculoskeletal: Gait normal. Symmetric and independent movement of all extremities  Neurological: Normal balance/coordination. No tremor.  Skin: warm, dry, intact.   Psychiatric: Normal judgment/insight. Normal mood and affect. Oriented x3.    Results for orders  placed or performed in visit on 09/28/16 (from the past 72 hour(s))  CBC with Differential/Platelet     Status: Abnormal   Collection Time: 09/28/16  3:42 PM  Result Value Ref Range   WBC 6.8 3.8 - 10.8 K/uL   RBC 4.13 3.80 - 5.10 MIL/uL   Hemoglobin 11.3 (L) 11.7 - 15.5 g/dL   HCT 35.7 35.0 - 45.0 %   MCV 86.4 80.0 - 100.0 fL   MCH 27.4 27.0 - 33.0 pg   MCHC 31.7 (L) 32.0 - 36.0 g/dL   RDW 15.5 (H) 11.0 - 15.0 %   Platelets 298 140 - 400 K/uL   MPV 13.0 (H) 7.5 - 12.5 fL   Neutro Abs 3,332 1,500 - 7,800 cells/uL   Lymphs Abs 1,836 850 - 3,900 cells/uL   Monocytes Absolute 748 200 - 950 cells/uL   Eosinophils Absolute 884 (H) 15 - 500 cells/uL   Basophils Absolute 0 0 - 200 cells/uL   Neutrophils Relative % 49 %   Lymphocytes Relative 27 %   Monocytes Relative 11 %   Eosinophils Relative 13 %   Basophils Relative 0 %   Smear Review Criteria for review not met   Hemoglobin A1c     Status: Abnormal   Collection Time: 09/28/16  3:42 PM  Result Value Ref Range   Hgb A1c MFr Bld 7.3 (H) <5.7 %    Comment:   For someone without known diabetes, a hemoglobin A1c value of 6.5% or greater indicates that they may have diabetes and this should be confirmed with a follow-up test.   For someone with known diabetes, a value <7% indicates that their diabetes is well controlled and a value greater than or equal to 7% indicates suboptimal control. A1c targets should be individualized based on duration of diabetes, age, comorbid conditions, and other considerations.   Currently, no consensus exists for use of hemoglobin A1c for diagnosis of diabetes for children.      Mean Plasma Glucose 163 mg/dL  COMPLETE METABOLIC PANEL WITH GFR     Status: Abnormal   Collection Time: 09/28/16  3:42 PM  Result Value Ref Range   Sodium 141 135 - 146 mmol/L   Potassium 4.7 3.5 - 5.3 mmol/L   Chloride 103 98 - 110 mmol/L   CO2 27 20 - 31 mmol/L   Glucose, Bld 233 (H)  65 - 99 mg/dL   BUN 24 7 - 25  mg/dL   Creat 1.46 (H) 0.60 - 0.93 mg/dL    Comment:   For patients > or = 79 years of age: The upper reference limit for Creatinine is approximately 13% higher for people identified as African-American.      Total Bilirubin 0.6 0.2 - 1.2 mg/dL   Alkaline Phosphatase 94 33 - 130 U/L   AST 29 10 - 35 U/L   ALT 13 6 - 29 U/L   Total Protein 6.8 6.1 - 8.1 g/dL   Albumin 3.9 3.6 - 5.1 g/dL   Calcium 9.1 8.6 - 10.4 mg/dL   GFR, Est African American 39 (L) >=60 mL/min   GFR, Est Non African American 34 (L) >=60 mL/min  TSH     Status: None   Collection Time: 09/28/16  3:42 PM  Result Value Ref Range   TSH 2.59 mIU/L    Comment:   Reference Range   > or = 20 Years  0.40-4.50   Pregnancy Range First trimester  0.26-2.66 Second trimester 0.55-2.73 Third trimester  0.43-2.91     VITAMIN D 25 Hydroxy (Vit-D Deficiency, Fractures)     Status: None   Collection Time: 09/28/16  3:42 PM  Result Value Ref Range   Vit D, 25-Hydroxy 45 30 - 100 ng/mL    Comment: Vitamin D Status           25-OH Vitamin D        Deficiency                <20 ng/mL        Insufficiency         20 - 29 ng/mL        Optimal             > or = 30 ng/mL   For 25-OH Vitamin D testing on patients on D2-supplementation and patients for whom quantitation of D2 and D3 fractions is required, the QuestAssureD 25-OH VIT D, (D2,D3), LC/MS/MS is recommended: order code [id number] (patients > 2 yrs).     No results found.   ASSESSMENT/PLAN:   Hospital discharge follow-up - followed by ID already for UTI, pt improved, declines repeat UA at this time  Hypothyroidism, unspecified type - Plan: TSH  Chronic obstructive pulmonary disease, unspecified COPD type ([place])  Chronic atrial fibrillation ([place])  Coronary artery disease due to lipid rich plaque  Essential hypertension, benign - Plan: CBC with Differential/Platelet, COMPLETE METABOLIC PANEL WITH GFR  CKD (chronic kidney disease) stage 3, GFR 30-59 ml/min -  Plan: VITAMIN D 25 Hydroxy (Vit-D Deficiency, Fractures)  Diabetes mellitus type 2, insulin dependent ([place]) - Plan: Hemoglobin A1c  Aortic atherosclerosis (HCC)  Type 2 diabetes mellitus with stage 3 chronic kidney disease, with long-term current use of insulin (HCC) - titrate back up on insulin, A1C doesn't indicate severe disease but recently changed dose, advised fasting Glc more important, f/u on this 1 month  Overactive bladder  Lumbar radicular pain  Anemia, unspecified type  Dry mouth - i think mouth pain isn't thrush-related, more dry mouth d/t anticholinergic therapy, pt advised can reduce/dc this med but declines, hard candy/citrus advised    Patient Instructions  Plan:  Diabetes:  Fasting sugars in the morning  Goal: 80-120 for fasting levels  Increase Insulin (Toujeo) by 3-5 Units twice per week until fasting sugars are at goal  Plan to follow-up for Diabetes checkup in 1 month  Dry Mouth:   try hard candies/gum to increase salivation    Visit summary with medication list and pertinent instructions was printed for patient to review. All questions at time of visit were answered - patient instructed to contact office with any additional concerns. ER/RTC precautions were reviewed with the patient. Follow-up plan: Return in about 1 month (around 10/29/2016) for DIABETES FOLLOW-UP, sooner if needed.  Note: Total time spent 40 minutes, greater than 50% of the visit was spent face-to-face counseling and coordinating care for the following: The primary encounter diagnosis was Hospital discharge follow-up. Diagnoses of Hypothyroidism, unspecified type, Chronic obstructive pulmonary disease, unspecified COPD type ([place]), Chronic atrial fibrillation ([place]), Coronary artery disease due to lipid rich plaque, Essential hypertension, benign, CKD (chronic kidney disease) stage 3, GFR 30-59 ml/min, Diabetes mellitus type 2, insulin dependent ([place]), Aortic atherosclerosis ([place]), Type 2  diabetes mellitus with stage 3 chronic kidney disease, with long-term current use of insulin ([place]), Overactive bladder, Lumbar radicular pain, Anemia, unspecified type, and Dry mouth were also pertinent to this visit.Marland Kitchen

## 2016-09-29 LAB — CBC WITH DIFFERENTIAL/PLATELET
Basophils Absolute: 0 cells/uL (ref 0–200)
Basophils Relative: 0 %
EOS ABS: 884 {cells}/uL — AB (ref 15–500)
EOS PCT: 13 %
HCT: 35.7 % (ref 35.0–45.0)
HEMOGLOBIN: 11.3 g/dL — AB (ref 11.7–15.5)
LYMPHS ABS: 1836 {cells}/uL (ref 850–3900)
Lymphocytes Relative: 27 %
MCH: 27.4 pg (ref 27.0–33.0)
MCHC: 31.7 g/dL — AB (ref 32.0–36.0)
MCV: 86.4 fL (ref 80.0–100.0)
MONOS PCT: 11 %
MPV: 13 fL — ABNORMAL HIGH (ref 7.5–12.5)
Monocytes Absolute: 748 cells/uL (ref 200–950)
NEUTROS PCT: 49 %
Neutro Abs: 3332 cells/uL (ref 1500–7800)
Platelets: 298 10*3/uL (ref 140–400)
RBC: 4.13 MIL/uL (ref 3.80–5.10)
RDW: 15.5 % — ABNORMAL HIGH (ref 11.0–15.0)
WBC: 6.8 10*3/uL (ref 3.8–10.8)

## 2016-09-29 LAB — COMPLETE METABOLIC PANEL WITH GFR
ALBUMIN: 3.9 g/dL (ref 3.6–5.1)
ALK PHOS: 94 U/L (ref 33–130)
ALT: 13 U/L (ref 6–29)
AST: 29 U/L (ref 10–35)
BILIRUBIN TOTAL: 0.6 mg/dL (ref 0.2–1.2)
BUN: 24 mg/dL (ref 7–25)
CALCIUM: 9.1 mg/dL (ref 8.6–10.4)
CHLORIDE: 103 mmol/L (ref 98–110)
CO2: 27 mmol/L (ref 20–31)
CREATININE: 1.46 mg/dL — AB (ref 0.60–0.93)
GFR, EST AFRICAN AMERICAN: 39 mL/min — AB (ref >=60)
GFR, Est Non African American: 34 mL/min — ABNORMAL LOW (ref >=60)
Glucose, Bld: 233 mg/dL — ABNORMAL HIGH (ref 65–99)
Potassium: 4.7 mmol/L (ref 3.5–5.3)
Sodium: 141 mmol/L (ref 135–146)
Total Protein: 6.8 g/dL (ref 6.1–8.1)

## 2016-09-29 LAB — VITAMIN D 25 HYDROXY (VIT D DEFICIENCY, FRACTURES): VIT D 25 HYDROXY: 45 ng/mL (ref 30–100)

## 2016-09-29 LAB — HEMOGLOBIN A1C
Hgb A1c MFr Bld: 7.3 % — ABNORMAL HIGH (ref <5.7)
Mean Plasma Glucose: 163 mg/dL

## 2016-09-29 LAB — TSH: TSH: 2.59 mIU/L

## 2016-09-29 NOTE — Addendum Note (Signed)
Encounter addended by: Esperanza Richters, CCC-SLP on: 09/29/2016  9:05 AM<BR>    Actions taken: Sign clinical note, Flowsheet accepted

## 2016-09-29 NOTE — Progress Notes (Signed)
SLP MBS Study addendum Late entry for 07/26/16 Visit diagnosis:   07/26/16 1700  Clinical Impression  Therapy Diagnosis Mild oral phase dysphagia (visit diagnosis dysphagia, oral phases (R13.11))  Clinical Impression Pt presents with mild oral dysphagia characterized by mild oral holding/hesitation with swallowing  - most notably with liquids.  SLP questions if pt is compensating for occasional "choking" that occurs with liquids.  NO aspiration/penetration with all consistencies tested - including thin, nectar, puree, cracker and mixed *tablet with thin.  Pt's pharyngeal swallow was overall strong without residuals.  Unfortunately pt did not demonstrate symptoms during test (no cough observed).  Did advise pt to consider chin tuck posture if cough with thin is consistent and posture effectively abates symptoms, although this was not tested under flouro.  Using live video, educated pt to findings and recommendations.  Thanks for this referral.    Impact on safety and function Mild aspiration risk  Jaxston Chohan L. Tivis Ringer, [place] CCC/SLP Pager [phone]

## 2016-09-30 ENCOUNTER — Other Ambulatory Visit: Payer: Self-pay

## 2016-09-30 NOTE — Patient Outreach (Signed)
[facility]) Care Management  09/30/2016  Jola Critzer [birth year] [id number]   3rd telephone call to patient for monthly call.  No answer.  HIPAA compliant voice message left.   Plan: RN Health Coach will send a letter to attempt outreach.  Jone Baseman, RN, MSN [place] [phone]

## 2016-10-18 ENCOUNTER — Other Ambulatory Visit: Payer: Self-pay

## 2016-10-18 NOTE — Patient Outreach (Signed)
[facility]) Care Management  10/18/2016  Briana Werner 09/24/1935 [id number]   Patient has not responded to calls or letter.  Will proceed with case closure.  Will notify care management assistant of case status.   Jone Baseman, RN, MSN [place] [phone]

## 2016-10-26 ENCOUNTER — Encounter: Payer: Self-pay | Admitting: Osteopathic Medicine

## 2016-10-26 ENCOUNTER — Other Ambulatory Visit: Payer: Self-pay | Admitting: Family Medicine

## 2016-10-26 ENCOUNTER — Ambulatory Visit (INDEPENDENT_AMBULATORY_CARE_PROVIDER_SITE_OTHER): Payer: Medicare Other | Admitting: Osteopathic Medicine

## 2016-10-26 VITALS — BP 135/85 | HR 99 | Ht 67.0 in | Wt 165.0 lb

## 2016-10-26 DIAGNOSIS — R0989 Other specified symptoms and signs involving the circulatory and respiratory systems: Secondary | ICD-10-CM | POA: Diagnosis not present

## 2016-10-26 DIAGNOSIS — R350 Frequency of micturition: Secondary | ICD-10-CM | POA: Diagnosis not present

## 2016-10-26 DIAGNOSIS — I2583 Coronary atherosclerosis due to lipid rich plaque: Secondary | ICD-10-CM | POA: Diagnosis not present

## 2016-10-26 DIAGNOSIS — E119 Type 2 diabetes mellitus without complications: Secondary | ICD-10-CM | POA: Diagnosis not present

## 2016-10-26 DIAGNOSIS — I2510 Atherosclerotic heart disease of native coronary artery without angina pectoris: Secondary | ICD-10-CM | POA: Diagnosis not present

## 2016-10-26 DIAGNOSIS — N3001 Acute cystitis with hematuria: Secondary | ICD-10-CM | POA: Diagnosis not present

## 2016-10-26 DIAGNOSIS — R300 Dysuria: Secondary | ICD-10-CM | POA: Diagnosis not present

## 2016-10-26 LAB — POCT URINALYSIS DIPSTICK
Bilirubin, UA: NEGATIVE
Ketones, UA: NEGATIVE
NITRITE UA: NEGATIVE
PROTEIN UA: 100
UROBILINOGEN UA: 0.2
pH, UA: 7

## 2016-10-26 LAB — POCT GLYCOSYLATED HEMOGLOBIN (HGB A1C): Hemoglobin A1C: 6.9

## 2016-10-26 MED ORDER — CEPHALEXIN 500 MG PO CAPS: 500.0000 mg | ORAL_CAPSULE | Freq: Two times a day (BID) | ORAL | 0 refills | Status: DC

## 2016-10-26 NOTE — Progress Notes (Signed)
HPI: Briana Werner is a 81 y.o. female  who presents to [facility] today, 10/26/16,  for chief complaint of:  Chief Complaint  Patient presents with  . Follow-up    DIABETES    Diabetes: Patient had some lack of clarity about how she should be taking the 2J0. She is splitting the dose to 18 units in morning and 19 units in the evening. A1c today indicates good control. Patient states that she has had a few episodes of hypoglycemia when she has been exerting herself more than usual.  Urinary frequency: New problem, recurrent. Recently treated for urinary tract infection. Records reviewed, culture positive for pansensitive Escherichia coli.  Swallowing concern: Happening several times a day, patient will experience bouts of coughing and then feels like she is unable to swallow, feels a scratching type pain in her throat. Not quite globus sensation. No regurgitation. She has recently undergone barium swallow which was not concerning. History of COPD.    Past medical history, surgical history, social history and family history reviewed.  Patient Active Problem List   Diagnosis Date Noted  . Cough 07/13/2016  . Tinea pedis 05/25/2016  . Diabetic foot ulcer ([place]) 05/25/2016  . Left hip pain 04/15/2016  . Lumbar radicular pain 01/15/2016  . Aortic atherosclerosis ([place]) 01/12/2016  . Anemia 02/16/2015  . Atrial fibrillation ([place]) 12/25/2014  . Diabetes mellitus with kidney complication ([place]) 96/29/5284  . Spondylisthesis 05/31/2014  . CAD (coronary artery disease) 05/01/2014  . Renal artery stenosis ([place]) 05/01/2014  . Generalized anxiety disorder 04/03/2014  . CKD (chronic kidney disease) stage 3, GFR 30-59 ml/min 03/26/2014  . Chronic atrial flutter ([place]) 03/19/2014  . Carotid artery stenosis 03/19/2014  . Hypercalcemia 02/27/2014  . Essential hypertension, benign 02/26/2014  . Hypothyroid 02/26/2014  . Hyperlipidemia 02/26/2014  . COPD (chronic  obstructive pulmonary disease) ([place]) 02/26/2014  . Dementia 02/26/2014  . Presence of stent in coronary artery in patient with coronary artery disease 02/26/2014  . Nephrolithiasis 02/26/2014  . Diabetic peripheral neuropathy ([place]) 02/26/2014  . DVT, lower extremity, recurrent ([place]) 02/26/2014  . Status post left hip replacement 02/26/2014  . Overactive bladder 02/26/2014    Current medication list and allergy/intolerance information reviewed.   Current Outpatient Prescriptions on File Prior to Visit  Medication Sig Dispense Refill  . AMBULATORY NON FORMULARY MEDICATION Diabetic Test Strips and Lancets: One Touch Ultra.   Dx Type 2 Diabetes.e11.21  Use to check blood sugar twice a day. 100 Units 3  . AMBULATORY NON FORMULARY MEDICATION Insulin needles for lantus solastar  31g x4 100 each 11  . atorvastatin (LIPITOR) 40 MG tablet TAKE 1 TABLET(40 MG) BY MOUTH DAILY AT 6 PM 90 tablet 0  . Calcium Carbonate-Vitamin D (CALTRATE 600+D PO) Take by mouth 2 (two) times daily.    . carvedilol (COREG) 6.25 MG tablet Take 1 tablet (6.25 mg total) by mouth 2 (two) times daily with a meal. 60 tablet 1  . ELIQUIS 5 MG TABS tablet TAKE 1 TABLET(5 MG) BY MOUTH TWICE DAILY 60 tablet 0  . escitalopram (LEXAPRO) 5 MG tablet TAKE 1 TABLET BY MOUTH DAILY 90 tablet 0  . FeFum-FePoly-FA-B Cmp-C-Biot (INTEGRA PLUS) CAPS TAKE 1 CAPSULE BY MOUTH DAILY 90 capsule 0  . Fluticasone-Salmeterol (ADVAIR DISKUS) 500-50 MCG/DOSE AEPB Inhale 1 puff into the lungs 2 (two) times daily. 60 each 6  . gabapentin (NEURONTIN) 300 MG capsule TAKE 1 CAPSULE(300 MG) BY MOUTH THREE TIMES DAILY 90 capsule 0  .  Insulin Glargine (TOUJEO SOLOSTAR) 300 UNIT/ML SOPN Inject 38 Units into the skin daily. (Patient taking differently: Inject 12 Units into the skin daily. ) 4.5 mL 2  . IRON PO Take by mouth.    Marland Kitchen JANUVIA 50 MG tablet TAKE 1 TABLET BY MOUTH EVERY DAY 90 tablet 0  . levothyroxine (SYNTHROID, LEVOTHROID) 88 MCG tablet TAKE 1  TABLET BY MOUTH DAILY BEFORE BREAKFAST 90 tablet 0  . lisinopril (PRINIVIL,ZESTRIL) 10 MG tablet TAKE 1 TABLET(10 MG) BY MOUTH DAILY 90 tablet 0  . montelukast (SINGULAIR) 10 MG tablet TAKE 1 TABLET BY MOUTH AT BEDTIME 30 tablet 0  . Multiple Vitamin (MULTIVITAMIN) capsule Take 1 capsule by mouth.    . ONE TOUCH ULTRA TEST test strip CHECK BLOOD GLUCOSE TWICE DAILY 100 each 1  . oxyCODONE-acetaminophen (ROXICET) 5-325 MG tablet Take 1 tablet by mouth 2 (two) times daily as needed for severe pain. 30 tablet 0  . pantoprazole (PROTONIX) 40 MG tablet TAKE 1 TABLET BY MOUTH DAILY 90 tablet 0  . PROVENTIL HFA 108 (90 Base) MCG/ACT inhaler INHALE 2 PUFFS BY MOUTH EVERY 4 TO 6 HOURS AS NEEDED FOR SHORTNESS OF BREATH OR WHEEZING 6.7 g 0  . VESICARE 5 MG tablet TK 1 T PO D  11   No current facility-administered medications on file prior to visit.    Allergies  Allergen Reactions  . Tetracycline Shortness Of Breath  . Tetracyclines & Related Anaphylaxis and Shortness Of Breath    dizziness  . Other Dermatitis    Bandaids   . Amlodipine     Edema  . Amoxicillin     Questionable rash  . Bactrim [Sulfamethoxazole-Trimethoprim] Other (See Comments)    dizzy dizzy  . Latex Dermatitis    Bandages, pulls skin  . Tape Dermatitis    Bandaids  Bandaids       Review of Systems:  Constitutional: No recent illness  HEENT: No  headache, no vision change  Cardiac: No  chest pain, No  pressure, No palpitations  Respiratory:  No  shortness of breath. No  Cough  Gastrointestinal: No  abdominal pain, no change on bowel habits  Musculoskeletal: No new myalgia/arthralgia  Skin: No  Rash  Hem/Onc: No  easy bruising/bleeding, No  abnormal lumps/bumps  Neurologic: No  weakness, No  Dizziness  Psychiatric: No  concerns with depression, No  concerns with anxiety  Exam:  BP 135/85   Pulse 99   Ht 5\' 7"  (1.702 m)   Wt 165 lb (74.8 kg)   BMI 25.84 kg/m   Constitutional: VS see above. General  Appearance: alert, well-developed, well-nourished, NAD  Eyes: Normal lids and conjunctive, non-icteric sclera  Ears, Nose, Mouth, Throat: MMM, Normal external inspection ears/nares/mouth/lips/gums.  Neck: No masses, trachea midline.   Respiratory: Normal respiratory effort. no wheeze, no rhonchi, no rales  Cardiovascular: S1/S2 normal, no murmur, no rub/gallop auscultated. RRR.   Musculoskeletal: Gait normal. Symmetric and independent movement of all extremities  Neurological: Normal balance/coordination. No tremor.  Skin: warm, dry, intact.   Psychiatric: Normal judgment/insight. Normal mood and affect. Oriented x3.     Results for orders placed or performed in visit on 10/26/16 (from the past 24 hour(s))  POCT HgB A1C     Status: None   Collection Time: 10/26/16  3:11 PM  Result Value Ref Range   Hemoglobin A1C 6.9   POCT Urinalysis Dipstick     Status: Abnormal   Collection Time: 10/26/16  3:45 PM  Result Value  Ref Range   Color, UA YELLOW    Clarity, UA CLEAR    Glucose, UA     Bilirubin, UA NEGATIVE    Ketones, UA NEGATIVE    Spec Grav, UA >=1.030    Blood, UA MODERATE    pH, UA 7.0    Protein, UA 100    Urobilinogen, UA 0.2    Nitrite, UA NEGATIVE    Leukocytes, UA large (3+) (A) Negative       ASSESSMENT/PLAN:   Diabetes mellitus without complication (HCC) - Advised Toujeo once per day, we'll decrease units given good A1c control and concern for infrequent though significant hypoglycemia - Plan: POCT HgB A1C  Urinary frequency - Plan: POCT Urinalysis Dipstick  Sensation, choking - Consider laryngoscopy. History of secondhand smoke exposure. - Plan: Ambulatory referral to ENT  Dysuria - Plan: Urine Culture  Acute cystitis with hematuria - Plan: cephALEXin (KEFLEX) 500 MG capsule    Patient Instructions  Plan for Insulin/Diabetes:  You can take the Toujeo 35 Units once per day - morning or evening   Please measure fasting sugars - when you get up  in the morning, check your sugars before you eat breakfast. Our goal for fasting sugars is 90-120.     Follow-up plan: Return in about 3 months (around 01/23/2017) for DIABETES RECHECK, SOONER IF NEEDED.  Visit summary with medication list and pertinent instructions was printed for patient to review, alert [place] if any changes needed. All questions at time of visit were answered - patient instructed to contact office with any additional concerns. ER/RTC precautions were reviewed with the patient and understanding verbalized.

## 2016-10-26 NOTE — Patient Instructions (Addendum)
Plan for Insulin/Diabetes:  You can take the [place] 35 Units once per day - morning or evening   Please measure fasting sugars - when you get up in the morning, check your sugars before you eat breakfast. Our goal for fasting sugars is 90-120.

## 2016-10-29 LAB — URINE CULTURE

## 2016-11-02 ENCOUNTER — Other Ambulatory Visit: Payer: Self-pay | Admitting: Family Medicine

## 2016-11-02 ENCOUNTER — Other Ambulatory Visit: Payer: Self-pay | Admitting: Osteopathic Medicine

## 2016-11-02 DIAGNOSIS — B029 Zoster without complications: Secondary | ICD-10-CM

## 2016-11-11 ENCOUNTER — Encounter: Payer: Self-pay | Admitting: Osteopathic Medicine

## 2016-11-16 ENCOUNTER — Other Ambulatory Visit: Payer: Self-pay | Admitting: Osteopathic Medicine

## 2016-11-17 ENCOUNTER — Other Ambulatory Visit: Payer: Self-pay | Admitting: Family Medicine

## 2016-11-28 ENCOUNTER — Other Ambulatory Visit: Payer: Self-pay | Admitting: Family Medicine

## 2016-11-29 ENCOUNTER — Other Ambulatory Visit: Payer: Self-pay | Admitting: Family Medicine

## 2016-11-29 ENCOUNTER — Other Ambulatory Visit: Payer: Self-pay | Admitting: Osteopathic Medicine

## 2016-12-02 ENCOUNTER — Other Ambulatory Visit: Payer: Self-pay

## 2016-12-02 DIAGNOSIS — N183 Chronic kidney disease, stage 3 unspecified: Secondary | ICD-10-CM

## 2016-12-02 DIAGNOSIS — E1122 Type 2 diabetes mellitus with diabetic chronic kidney disease: Secondary | ICD-10-CM

## 2016-12-02 DIAGNOSIS — Z794 Long term (current) use of insulin: Principal | ICD-10-CM

## 2016-12-02 MED ORDER — AMBULATORY NON FORMULARY MEDICATION: 3 refills | Status: DC

## 2016-12-03 DIAGNOSIS — R05 Cough: Secondary | ICD-10-CM | POA: Diagnosis not present

## 2016-12-03 DIAGNOSIS — Z8709 Personal history of other diseases of the respiratory system: Secondary | ICD-10-CM | POA: Diagnosis not present

## 2016-12-03 DIAGNOSIS — K219 Gastro-esophageal reflux disease without esophagitis: Secondary | ICD-10-CM | POA: Diagnosis not present

## 2016-12-03 DIAGNOSIS — J339 Nasal polyp, unspecified: Secondary | ICD-10-CM | POA: Diagnosis not present

## 2016-12-06 DIAGNOSIS — R05 Cough: Secondary | ICD-10-CM | POA: Diagnosis not present

## 2016-12-06 DIAGNOSIS — R918 Other nonspecific abnormal finding of lung field: Secondary | ICD-10-CM | POA: Diagnosis not present

## 2016-12-07 ENCOUNTER — Encounter: Payer: Self-pay | Admitting: Osteopathic Medicine

## 2016-12-07 DIAGNOSIS — J339 Nasal polyp, unspecified: Secondary | ICD-10-CM | POA: Insufficient documentation

## 2016-12-12 ENCOUNTER — Other Ambulatory Visit: Payer: Self-pay | Admitting: Osteopathic Medicine

## 2016-12-12 DIAGNOSIS — B029 Zoster without complications: Secondary | ICD-10-CM

## 2016-12-20 ENCOUNTER — Other Ambulatory Visit: Payer: Self-pay | Admitting: Osteopathic Medicine

## 2016-12-20 ENCOUNTER — Other Ambulatory Visit: Payer: Self-pay | Admitting: Family Medicine

## 2016-12-21 ENCOUNTER — Other Ambulatory Visit: Payer: Self-pay | Admitting: Osteopathic Medicine

## 2017-01-03 ENCOUNTER — Other Ambulatory Visit: Payer: Self-pay | Admitting: Family Medicine

## 2017-01-03 DIAGNOSIS — E1121 Type 2 diabetes mellitus with diabetic nephropathy: Secondary | ICD-10-CM

## 2017-01-11 ENCOUNTER — Other Ambulatory Visit: Payer: Self-pay | Admitting: Osteopathic Medicine

## 2017-01-11 DIAGNOSIS — B029 Zoster without complications: Secondary | ICD-10-CM

## 2017-01-18 ENCOUNTER — Encounter: Payer: Self-pay | Admitting: Osteopathic Medicine

## 2017-01-18 ENCOUNTER — Ambulatory Visit (INDEPENDENT_AMBULATORY_CARE_PROVIDER_SITE_OTHER): Payer: Medicare Other

## 2017-01-18 ENCOUNTER — Ambulatory Visit (INDEPENDENT_AMBULATORY_CARE_PROVIDER_SITE_OTHER): Payer: Medicare Other | Admitting: Osteopathic Medicine

## 2017-01-18 VITALS — BP 115/60 | HR 98 | Wt 164.0 lb

## 2017-01-18 DIAGNOSIS — J441 Chronic obstructive pulmonary disease with (acute) exacerbation: Secondary | ICD-10-CM | POA: Diagnosis not present

## 2017-01-18 DIAGNOSIS — J449 Chronic obstructive pulmonary disease, unspecified: Secondary | ICD-10-CM | POA: Diagnosis not present

## 2017-01-18 DIAGNOSIS — I2583 Coronary atherosclerosis due to lipid rich plaque: Secondary | ICD-10-CM | POA: Diagnosis not present

## 2017-01-18 DIAGNOSIS — Z794 Long term (current) use of insulin: Secondary | ICD-10-CM | POA: Diagnosis not present

## 2017-01-18 DIAGNOSIS — I2510 Atherosclerotic heart disease of native coronary artery without angina pectoris: Secondary | ICD-10-CM | POA: Diagnosis not present

## 2017-01-18 DIAGNOSIS — R05 Cough: Secondary | ICD-10-CM | POA: Diagnosis not present

## 2017-01-18 DIAGNOSIS — N183 Chronic kidney disease, stage 3 unspecified: Secondary | ICD-10-CM

## 2017-01-18 DIAGNOSIS — R059 Cough, unspecified: Secondary | ICD-10-CM

## 2017-01-18 DIAGNOSIS — E1122 Type 2 diabetes mellitus with diabetic chronic kidney disease: Secondary | ICD-10-CM

## 2017-01-18 DIAGNOSIS — I700 Atherosclerosis of aorta: Secondary | ICD-10-CM

## 2017-01-18 LAB — CBC WITH DIFFERENTIAL/PLATELET
BASOS ABS: 94 {cells}/uL (ref 0–200)
Basophils Relative: 1 %
EOS PCT: 12 %
Eosinophils Absolute: 1128 cells/uL — ABNORMAL HIGH (ref 15–500)
HEMATOCRIT: 36.3 % (ref 35.0–45.0)
Hemoglobin: 11.6 g/dL — ABNORMAL LOW (ref 11.7–15.5)
LYMPHS ABS: 2068 {cells}/uL (ref 850–3900)
LYMPHS PCT: 22 %
MCH: 27.7 pg (ref 27.0–33.0)
MCHC: 32 g/dL (ref 32.0–36.0)
MCV: 86.6 fL (ref 80.0–100.0)
MPV: 11.8 fL (ref 7.5–12.5)
Monocytes Absolute: 1316 cells/uL — ABNORMAL HIGH (ref 200–950)
Monocytes Relative: 14 %
NEUTROS PCT: 51 %
Neutro Abs: 4794 cells/uL (ref 1500–7800)
Platelets: 281 10*3/uL (ref 140–400)
RBC: 4.19 MIL/uL (ref 3.80–5.10)
RDW: 15.3 % — AB (ref 11.0–15.0)
WBC: 9.4 10*3/uL (ref 3.8–10.8)

## 2017-01-18 LAB — BASIC METABOLIC PANEL WITH GFR
BUN: 54 mg/dL — ABNORMAL HIGH (ref 7–25)
CALCIUM: 9.1 mg/dL (ref 8.6–10.4)
CO2: 18 mmol/L — ABNORMAL LOW (ref 20–31)
CREATININE: 2.72 mg/dL — AB (ref 0.60–0.93)
Chloride: 103 mmol/L (ref 98–110)
GFR, EST AFRICAN AMERICAN: 19 mL/min — AB (ref >=60)
GFR, Est Non African American: 16 mL/min — ABNORMAL LOW (ref >=60)
GLUCOSE: 171 mg/dL — AB (ref 65–99)
Potassium: 4.5 mmol/L (ref 3.5–5.3)
Sodium: 141 mmol/L (ref 135–146)

## 2017-01-18 MED ORDER — AZITHROMYCIN 250 MG PO TABS: ORAL_TABLET | ORAL | 0 refills | Status: DC

## 2017-01-18 MED ORDER — PREDNISONE 20 MG PO TABS: 20.0000 mg | ORAL_TABLET | Freq: Two times a day (BID) | ORAL | 0 refills | Status: DC

## 2017-01-18 NOTE — Patient Instructions (Addendum)
Plan:   Albuterol several times per day  Add steroids and antibiotics   Plan to recheck  In the office in 2-3 days to monitor oxygen and labs/lungs   If worse go to ER

## 2017-01-18 NOTE — Progress Notes (Signed)
HPI: Briana Werner is a 81 y.o. female who presents to [facility] 01/18/17 for chief complaint of:  Chief Complaint  Patient presents with  . Cough    Acute Illness: . Context: Hx COPD w/ hospitalization for exacerbation, Compliant with inhaler medications as noted below. . Location & Quality: Nagging cough, fatigue, cough has gotten worse over the past 4 days or so, has not noticed fever/chills . Assoc signs/symptoms: see ROS . Duration: 4 days . Modifying factors: has tried the following OTC/Rx medications: None other than medication list as noted below though she is not taking her albuterol. Patient states that she has been seeing allergist which has prescribed a few different medications for chronic cough which haven't seemed to be working, patient isn't sure names of these medicines.   Past medical, social and family history reviewed.  Immune compromising conditions or other risk factors: COPD, diabetes, history of hospitalization as noted above  Current medications and allergies reviewed.  Current Outpatient Prescriptions on File Prior to Visit  Medication Sig Dispense Refill  . AMBULATORY NON FORMULARY MEDICATION Insulin needles for lantus solastar  31g x4 100 each 11  . AMBULATORY NON FORMULARY MEDICATION Diabetic Test Strips and Lancets: One Touch Ultra Blue  Dx Type 2 Diabetes.e11.22  Use to check blood sugar twice a day. 100 each 3  . atorvastatin (LIPITOR) 40 MG tablet TAKE 1 TABLET(40 MG) BY MOUTH DAILY AT 6 PM 90 tablet 0  . Calcium Carbonate-Vitamin D (CALTRATE 600+D PO) Take by mouth 2 (two) times daily.    . carvedilol (COREG) 6.25 MG tablet Take 1 tablet (6.25 mg total) by mouth 2 (two) times daily with a meal. 60 tablet 1  . ELIQUIS 5 MG TABS tablet TAKE 1 TABLET(5 MG) BY MOUTH TWICE DAILY 60 tablet 0  . escitalopram (LEXAPRO) 5 MG tablet TAKE 1 TABLET BY MOUTH DAILY 90 tablet 0  . FeFum-FePoly-FA-B Cmp-C-Biot (INTEGRA PLUS) CAPS  TAKE 1 CAPSULE BY MOUTH DAILY 90 capsule 0  . Fluticasone-Salmeterol (ADVAIR DISKUS) 500-50 MCG/DOSE AEPB Inhale 1 puff into the lungs 2 (two) times daily. 60 each 6  . gabapentin (NEURONTIN) 300 MG capsule TAKE 1 CAPSULE(300 MG) BY MOUTH THREE TIMES DAILY 90 capsule 0  . IRON PO Take by mouth.    Marland Kitchen JANUVIA 50 MG tablet TAKE 1 TABLET BY MOUTH EVERY DAY 90 tablet 0  . levothyroxine (SYNTHROID, LEVOTHROID) 88 MCG tablet TAKE 1 TABLET BY MOUTH DAILY BEFORE BREAKFAST 90 tablet 0  . lisinopril (PRINIVIL,ZESTRIL) 10 MG tablet TAKE 1 TABLET(10 MG) BY MOUTH DAILY 90 tablet 0  . montelukast (SINGULAIR) 10 MG tablet TAKE 1 TABLET BY MOUTH AT BEDTIME 30 tablet 0  . Multiple Vitamin (MULTIVITAMIN) capsule Take 1 capsule by mouth.    . ONE TOUCH ULTRA TEST test strip CHECK BLOOD GLUCOSE TWICE DAILY 100 each 1  . oxyCODONE-acetaminophen (ROXICET) 5-325 MG tablet Take 1 tablet by mouth 2 (two) times daily as needed for severe pain. 30 tablet 0  . pantoprazole (PROTONIX) 40 MG tablet TAKE 1 TABLET BY MOUTH DAILY 90 tablet 0  . PROVENTIL HFA 108 (90 Base) MCG/ACT inhaler INHALE 2 PUFFS BY MOUTH EVERY 4 TO 6 HOURS AS NEEDED FOR SHORTNESS OF BREATH OR WHEEZING 6.7 g 0  . TOUJEO SOLOSTAR 300 UNIT/ML SOPN INJECT 38 UNITS UNDER THE SKIN DAILY 4.5 mL 6  . VESICARE 5 MG tablet TK 1 T PO D  11  . cephALEXin (KEFLEX) 500 MG capsule Take  1 capsule (500 mg total) by mouth 2 (two) times daily. (Patient not taking: Reported on 01/18/2017) 14 capsule 0   No current facility-administered medications on file prior to visit.      Review of Systems:  Constitutional: No  fever/chills, +fatigue  HEENT: Yes  headache, No  sore throat, No  swollen glands  Cardiovascular: No chest pain  Respiratory:Yes  cough, No  shortness of breath  Gastrointestinal: No  nausea, No  vomiting,  No  diarrhea  Musculoskeletal:   No  myalgia/arthralgia  Skin/Integument:  No  Rash  Neuro: Yes lightheaded    Detailed Exam:  BP 115/60    Pulse 98   Wt 164 lb (74.4 kg)   SpO2 93%   BMI 25.69 kg/m  oxygen levels dropping to 89 with coughing but rebounded up to 93 with normal breathing  Constitutional:   VSS, see above.   General Appearance: alert, NAD  Eyes:   Normal lids and conjunctive, non-icteric sclera  Ears, Nose, Mouth, Throat:   Normal external inspection ears/nares  Normal mouth/lips/gums, MMM  normal TM  posterior pharynx without erythema, without exudate  nasal mucosa normal  Skin:  Normal inspection, no rash or concerning lesions noted on limited exam  Neck:   No masses, trachea midline. normal lymph nodes  Respiratory:   Normal respiratory effort.   Bilateral expiratory wheeze/ronchi  Cardiovascular:   S1/S2 normal, no murmur/rub/gallop auscultated. Rate high 90s-low 100s.    CXR on personal review Cardiomediastinal silhouette/heart size: normal Obvious bony abnormality: none Infiltrate/Mass: ?mass RLLL Diaphragms: difficult to determine - await radiology over read, extensive gastric bubble Lateral view: normal Images were reviewed with the patient. Pt counseled that radiologist will review the images as well, our office will call if the formal read reveals any significant findings other than what has been noted above.     ASSESSMENT/PLAN: CURB65 score pending BMP results but for now will consider score of wine with outpatient treatment. Blood counts were obtained prior to initiation of steroids, if white cell count shows concerning elevation with borderline tachycardia and shortness of breath/borderline oxygen saturation would consider bring patient back for IV fluids versus referral to the emergency department for stabilization. Patient would like to avoid the hospital at all costs, I think this is reasonable for now but patient is advised extensively on ER precautions. Would like her to come back to the office in a few days for recheck of symptoms and oxygen levels  Chronic  obstructive pulmonary disease, unspecified COPD type ([place]) - Plan: CBC with Differential/Platelet, BASIC METABOLIC PANEL WITH GFR, DG Chest 2 View  CKD (chronic kidney disease) stage 3, GFR 30-59 ml/min - Plan: BASIC METABOLIC PANEL WITH GFR  Type 2 diabetes mellitus with stage 3 chronic kidney disease, with long-term current use of insulin ([place]) - Plan: BASIC METABOLIC PANEL WITH GFR  Cough - Plan: CBC with Differential/Platelet, BASIC METABOLIC PANEL WITH GFR, DG Chest 2 View  COPD exacerbation (HCC) - Plan: azithromycin (ZITHROMAX) 250 MG tablet, predniSONE (DELTASONE) 20 MG tablet     Patient Instructions  Plan:   Albuterol several times per day  Add steroids and antibiotics   Plan to recheck  In the office in 2-3 days to monitor oxygen and labs/lungs   If worse go to ER     Visit summary was printed for the patient with medications and pertinent instructions for patient to review. ER/RTC precautions reviewed. All questions answered. Return for recheck in 2-3 days with [facility] .

## 2017-01-19 DIAGNOSIS — R0602 Shortness of breath: Secondary | ICD-10-CM | POA: Diagnosis not present

## 2017-01-19 DIAGNOSIS — Z96642 Presence of left artificial hip joint: Secondary | ICD-10-CM | POA: Diagnosis present

## 2017-01-19 DIAGNOSIS — D72829 Elevated white blood cell count, unspecified: Secondary | ICD-10-CM | POA: Diagnosis not present

## 2017-01-19 DIAGNOSIS — R05 Cough: Secondary | ICD-10-CM | POA: Diagnosis not present

## 2017-01-19 DIAGNOSIS — I517 Cardiomegaly: Secondary | ICD-10-CM | POA: Diagnosis not present

## 2017-01-19 DIAGNOSIS — F0280 Dementia in other diseases classified elsewhere without behavioral disturbance: Secondary | ICD-10-CM | POA: Diagnosis present

## 2017-01-19 DIAGNOSIS — N3001 Acute cystitis with hematuria: Secondary | ICD-10-CM | POA: Diagnosis not present

## 2017-01-19 DIAGNOSIS — B9689 Other specified bacterial agents as the cause of diseases classified elsewhere: Secondary | ICD-10-CM | POA: Diagnosis not present

## 2017-01-19 DIAGNOSIS — E039 Hypothyroidism, unspecified: Secondary | ICD-10-CM | POA: Diagnosis not present

## 2017-01-19 DIAGNOSIS — G301 Alzheimer's disease with late onset: Secondary | ICD-10-CM | POA: Diagnosis present

## 2017-01-19 DIAGNOSIS — I6523 Occlusion and stenosis of bilateral carotid arteries: Secondary | ICD-10-CM | POA: Diagnosis present

## 2017-01-19 DIAGNOSIS — Z96611 Presence of right artificial shoulder joint: Secondary | ICD-10-CM | POA: Diagnosis present

## 2017-01-19 DIAGNOSIS — E1142 Type 2 diabetes mellitus with diabetic polyneuropathy: Secondary | ICD-10-CM | POA: Diagnosis present

## 2017-01-19 DIAGNOSIS — N390 Urinary tract infection, site not specified: Secondary | ICD-10-CM | POA: Diagnosis not present

## 2017-01-19 DIAGNOSIS — E1122 Type 2 diabetes mellitus with diabetic chronic kidney disease: Secondary | ICD-10-CM | POA: Diagnosis not present

## 2017-01-19 DIAGNOSIS — R5383 Other fatigue: Secondary | ICD-10-CM | POA: Diagnosis not present

## 2017-01-19 DIAGNOSIS — I2510 Atherosclerotic heart disease of native coronary artery without angina pectoris: Secondary | ICD-10-CM | POA: Diagnosis not present

## 2017-01-19 DIAGNOSIS — A4189 Other specified sepsis: Secondary | ICD-10-CM | POA: Diagnosis not present

## 2017-01-19 DIAGNOSIS — N3281 Overactive bladder: Secondary | ICD-10-CM | POA: Diagnosis present

## 2017-01-19 DIAGNOSIS — N179 Acute kidney failure, unspecified: Secondary | ICD-10-CM | POA: Diagnosis not present

## 2017-01-19 DIAGNOSIS — R319 Hematuria, unspecified: Secondary | ICD-10-CM | POA: Diagnosis not present

## 2017-01-19 DIAGNOSIS — I130 Hypertensive heart and chronic kidney disease with heart failure and stage 1 through stage 4 chronic kidney disease, or unspecified chronic kidney disease: Secondary | ICD-10-CM | POA: Diagnosis not present

## 2017-01-19 DIAGNOSIS — Z7901 Long term (current) use of anticoagulants: Secondary | ICD-10-CM | POA: Diagnosis not present

## 2017-01-19 DIAGNOSIS — I4892 Unspecified atrial flutter: Secondary | ICD-10-CM | POA: Diagnosis not present

## 2017-01-19 DIAGNOSIS — I129 Hypertensive chronic kidney disease with stage 1 through stage 4 chronic kidney disease, or unspecified chronic kidney disease: Secondary | ICD-10-CM | POA: Diagnosis not present

## 2017-01-19 DIAGNOSIS — I5032 Chronic diastolic (congestive) heart failure: Secondary | ICD-10-CM | POA: Diagnosis not present

## 2017-01-19 DIAGNOSIS — E1169 Type 2 diabetes mellitus with other specified complication: Secondary | ICD-10-CM | POA: Diagnosis present

## 2017-01-19 DIAGNOSIS — J449 Chronic obstructive pulmonary disease, unspecified: Secondary | ICD-10-CM | POA: Diagnosis not present

## 2017-01-19 DIAGNOSIS — E782 Mixed hyperlipidemia: Secondary | ICD-10-CM | POA: Diagnosis present

## 2017-01-19 DIAGNOSIS — N189 Chronic kidney disease, unspecified: Secondary | ICD-10-CM | POA: Diagnosis not present

## 2017-01-19 DIAGNOSIS — Z96653 Presence of artificial knee joint, bilateral: Secondary | ICD-10-CM | POA: Diagnosis present

## 2017-01-19 DIAGNOSIS — I4891 Unspecified atrial fibrillation: Secondary | ICD-10-CM | POA: Diagnosis present

## 2017-01-19 DIAGNOSIS — N183 Chronic kidney disease, stage 3 (moderate): Secondary | ICD-10-CM | POA: Diagnosis present

## 2017-01-19 DIAGNOSIS — J441 Chronic obstructive pulmonary disease with (acute) exacerbation: Secondary | ICD-10-CM | POA: Diagnosis not present

## 2017-01-20 ENCOUNTER — Ambulatory Visit: Payer: Medicare Other | Admitting: Physician Assistant

## 2017-01-24 ENCOUNTER — Telehealth: Payer: Self-pay | Admitting: Osteopathic Medicine

## 2017-01-24 ENCOUNTER — Ambulatory Visit: Payer: Medicare Other | Admitting: Osteopathic Medicine

## 2017-01-24 NOTE — Telephone Encounter (Signed)
Please have patient schedule hospital follow-up visit. Recently admitted for kidney injury and UTI

## 2017-01-28 ENCOUNTER — Ambulatory Visit (INDEPENDENT_AMBULATORY_CARE_PROVIDER_SITE_OTHER): Payer: Medicare Other | Admitting: Osteopathic Medicine

## 2017-01-28 ENCOUNTER — Encounter: Payer: Self-pay | Admitting: Osteopathic Medicine

## 2017-01-28 VITALS — BP 117/58 | HR 73 | Ht 67.0 in | Wt 164.0 lb

## 2017-01-28 DIAGNOSIS — B961 Klebsiella pneumoniae [K. pneumoniae] as the cause of diseases classified elsewhere: Secondary | ICD-10-CM

## 2017-01-28 DIAGNOSIS — B9689 Other specified bacterial agents as the cause of diseases classified elsewhere: Secondary | ICD-10-CM

## 2017-01-28 DIAGNOSIS — I482 Chronic atrial fibrillation, unspecified: Secondary | ICD-10-CM

## 2017-01-28 DIAGNOSIS — I10 Essential (primary) hypertension: Secondary | ICD-10-CM

## 2017-01-28 DIAGNOSIS — J439 Emphysema, unspecified: Secondary | ICD-10-CM | POA: Diagnosis not present

## 2017-01-28 DIAGNOSIS — I2583 Coronary atherosclerosis due to lipid rich plaque: Secondary | ICD-10-CM

## 2017-01-28 DIAGNOSIS — I2510 Atherosclerotic heart disease of native coronary artery without angina pectoris: Secondary | ICD-10-CM

## 2017-01-28 DIAGNOSIS — N183 Chronic kidney disease, stage 3 unspecified: Secondary | ICD-10-CM

## 2017-01-28 DIAGNOSIS — E119 Type 2 diabetes mellitus without complications: Secondary | ICD-10-CM | POA: Diagnosis not present

## 2017-01-28 DIAGNOSIS — N390 Urinary tract infection, site not specified: Secondary | ICD-10-CM | POA: Diagnosis not present

## 2017-01-28 DIAGNOSIS — R300 Dysuria: Secondary | ICD-10-CM | POA: Diagnosis not present

## 2017-01-28 DIAGNOSIS — Z794 Long term (current) use of insulin: Secondary | ICD-10-CM

## 2017-01-28 LAB — POCT URINALYSIS DIPSTICK
Bilirubin, UA: NEGATIVE
Blood, UA: NEGATIVE
Glucose, UA: NEGATIVE
Ketones, UA: NEGATIVE
Nitrite, UA: NEGATIVE
Spec Grav, UA: 1.015 (ref 1.010–1.025)
Urobilinogen, UA: 0.2 U/dL
pH, UA: 6 (ref 5.0–8.0)

## 2017-01-28 MED ORDER — IPRATROPIUM-ALBUTEROL 0.5-2.5 (3) MG/3ML IN SOLN: 3.0000 mL | RESPIRATORY_TRACT | 1 refills | Status: AC | PRN

## 2017-01-28 MED ORDER — GUAIFENESIN 200 MG PO TABS: 200.0000 mg | ORAL_TABLET | ORAL | 0 refills | Status: DC | PRN

## 2017-01-28 NOTE — Progress Notes (Signed)
HPI: Briana Werner is a 81 y.o. female  who presents to [place] today, 01/28/17,  for chief complaint of:  Chief Complaint  Patient presents with  . Hospitalization Follow-up    UTI    Patient recently seen in the office for COPD exacerbation, labs demonstrated concern for significantly decreased kidney function and That in light of decreased oxygen level patient was advised to seek hospital care rather than follow-up in the office. Patient was admitted at [facility], see below for review of hospital course from discharge summary. Few other complications arose as noted below since last visit.  Overall though patient is feeling well since discharge from the hospital except for some persistent fatigue and persistent dry cough which has been slowly improving.  Daughter manages most of her medications, patient does not have pill bottles with her, see below for some changes made by hospitalist.  Hospital Course:  *AKI (acute kidney injury) (*) on CKD 3 with single functioning kidney Patient admitted and placed on IVF. Patient with single functioning kidney. Creatinine improved to 1.46 on taken off IVF. Recheck in am of discharge revealed creatinine 1.36. Sepsis due to UTI Acute cystitis with hematuria Continued Rocephin on admission. Followup urine culture grew Klebsiella oxytoca and blood cultures negative. Changed to cefdinir upon discharge COPD exacerbation Patient with wheezing and got solumedrol in ED on admission. She is asymptomatic w/ regards to shortness of breath or wheezing but has cough. Continue home medications  Type 2 diabetes mellitus with chronic kidney disease stage 3b, GFR 30-44 mL/min HA1C 7.3. Improved blood sugar. Changed back to [facility] upon discharge. Continue diabetic diet  Atrial flutter, chronic (*) Rate controlled on coreg and anticoagulated on eliquis Acquired hypothyroidism Continue synthroid. TSH  0.49. Chronic diastolic congestive heart failure, NYHA class 2 (*) Continue home medication. Monitor and may require diuretic. Hypertension Hold zestril in light of AKI. Increased coreg and followup PCP Hyperlipidemia Continue lipitor Cough Repeat CXR unremarkable. Lisinopril discontinued. Leukocytosis ?related to steroid use. Recheck in am of discharge down from 18.2 to 14.7.    Past medical history, surgical history, social history and family history reviewed.  Patient Active Problem List   Diagnosis Date Noted  . Nasal polyposis 12/07/2016  . Cough 07/13/2016  . Tinea pedis 05/25/2016  . Diabetic foot ulcer ([place]) 05/25/2016  . Left hip pain 04/15/2016  . Lumbar radicular pain 01/15/2016  . Aortic atherosclerosis ([place]) 01/12/2016  . Anemia 02/16/2015  . Atrial fibrillation ([place]) 12/25/2014  . Diabetes mellitus with kidney complication ([place]) 11/73/5670  . Spondylisthesis 05/31/2014  . CAD (coronary artery disease) 05/01/2014  . Renal artery stenosis ([place]) 05/01/2014  . Generalized anxiety disorder 04/03/2014  . CKD (chronic kidney disease) stage 3, GFR 30-59 ml/min 03/26/2014  . Chronic atrial flutter ([place]) 03/19/2014  . Carotid artery stenosis 03/19/2014  . Hypercalcemia 02/27/2014  . Essential hypertension, benign 02/26/2014  . Hypothyroid 02/26/2014  . Hyperlipidemia 02/26/2014  . COPD (chronic obstructive pulmonary disease) ([place]) 02/26/2014  . Dementia 02/26/2014  . Presence of stent in coronary artery in patient with coronary artery disease 02/26/2014  . Nephrolithiasis 02/26/2014  . Diabetic peripheral neuropathy ([place]) 02/26/2014  . DVT, lower extremity, recurrent ([place]) 02/26/2014  . Status post left hip replacement 02/26/2014  . Overactive bladder 02/26/2014    Current medication list and allergy/intolerance information reviewed.   Current Outpatient Prescriptions on File Prior to Visit  Medication Sig Dispense Refill  . AMBULATORY NON FORMULARY  MEDICATION Insulin needles for lantus  solastar  31g x4 100 each 11  . AMBULATORY NON FORMULARY MEDICATION Diabetic Test Strips and Lancets: One Touch Ultra Blue  Dx Type 2 Diabetes.e11.22  Use to check blood sugar twice a day. 100 each 3  . atorvastatin (LIPITOR) 40 MG tablet TAKE 1 TABLET(40 MG) BY MOUTH DAILY AT 6 PM 90 tablet 0  . Calcium Carbonate-Vitamin D (CALTRATE 600+D PO) Take by mouth 2 (two) times daily.    . cephALEXin (KEFLEX) 500 MG capsule Take 1 capsule (500 mg total) by mouth 2 (two) times daily. 14 capsule 0  . ELIQUIS 5 MG TABS tablet TAKE 1 TABLET(5 MG) BY MOUTH TWICE DAILY 60 tablet 0  . escitalopram (LEXAPRO) 5 MG tablet TAKE 1 TABLET BY MOUTH DAILY 90 tablet 0  . FeFum-FePoly-FA-B Cmp-C-Biot (INTEGRA PLUS) CAPS TAKE 1 CAPSULE BY MOUTH DAILY 90 capsule 0  . Fluticasone-Salmeterol (ADVAIR DISKUS) 500-50 MCG/DOSE AEPB Inhale 1 puff into the lungs 2 (two) times daily. 60 each 6  . gabapentin (NEURONTIN) 300 MG capsule TAKE 1 CAPSULE(300 MG) BY MOUTH THREE TIMES DAILY 90 capsule 0  . IRON PO Take by mouth.    Marland Kitchen JANUVIA 50 MG tablet TAKE 1 TABLET BY MOUTH EVERY DAY 90 tablet 0  . levothyroxine (SYNTHROID, LEVOTHROID) 88 MCG tablet TAKE 1 TABLET BY MOUTH DAILY BEFORE BREAKFAST 90 tablet 0  . montelukast (SINGULAIR) 10 MG tablet TAKE 1 TABLET BY MOUTH AT BEDTIME 30 tablet 0  . Multiple Vitamin (MULTIVITAMIN) capsule Take 1 capsule by mouth.    . ONE TOUCH ULTRA TEST test strip CHECK BLOOD GLUCOSE TWICE DAILY 100 each 1  . oxyCODONE-acetaminophen (ROXICET) 5-325 MG tablet Take 1 tablet by mouth 2 (two) times daily as needed for severe pain. 30 tablet 0  . pantoprazole (PROTONIX) 40 MG tablet TAKE 1 TABLET BY MOUTH DAILY 90 tablet 0  . predniSONE (DELTASONE) 20 MG tablet Take 1 tablet (20 mg total) by mouth 2 (two) times daily with a meal. 10 tablet 0  . PROVENTIL HFA 108 (90 Base) MCG/ACT inhaler INHALE 2 PUFFS BY MOUTH EVERY 4 TO 6 HOURS AS NEEDED FOR SHORTNESS OF BREATH OR  WHEEZING 6.7 g 0  . TOUJEO SOLOSTAR 300 UNIT/ML SOPN INJECT 38 UNITS UNDER THE SKIN DAILY 4.5 mL 6  . VESICARE 5 MG tablet TK 1 T PO D  11   No current facility-administered medications on file prior to visit.    Allergies  Allergen Reactions  . Tetracycline Shortness Of Breath  . Tetracyclines & Related Anaphylaxis and Shortness Of Breath    dizziness  . Other Dermatitis    Bandaids   . Amlodipine     Edema  . Amoxicillin     Questionable rash  . Bactrim [Sulfamethoxazole-Trimethoprim] Other (See Comments)    dizzy dizzy  . Latex Dermatitis    Bandages, pulls skin  . Tape Dermatitis    Bandaids  Bandaids       Review of Systems:  Constitutional: + recent illness, recovering  HEENT: No  headache  Cardiac: No  chest pain, No  pressure, No palpitations  Respiratory:  No  shortness of breath. +Cough  Gastrointestinal: No  abdominal pain  Musculoskeletal: No new myalgia/arthralgia  Skin: No  Rash  Neurologic: No  weakness, No  Dizziness   Exam:  BP (!) 117/58   Pulse 73   Ht 5' 7"  (1.702 m)   Wt 164 lb (74.4 kg)   BMI 25.69 kg/m   Constitutional: VS see above.  General Appearance: alert, well-developed, well-nourished, NAD  Eyes: Normal lids and conjunctive, non-icteric sclera  Ears, Nose, Mouth, Throat: MMM, Normal external inspection ears/nares/mouth/lips/gums.  Neck: No masses, trachea midline.   Respiratory: Normal respiratory effort. no wheeze, no rhonchi, no rales  Cardiovascular: S1/S2 normal, no murmur, no rub/gallop auscultated. RRR.   Musculoskeletal: Gait normal. Symmetric and independent movement of all extremities  Neurological: Normal balance/coordination. No tremor.  Skin: warm, dry, intact.   Psychiatric: Normal judgment/insight. Normal mood and affect. Oriented x3.   Reviewed labs from hospital, renal function essentially back to baseline with creatinine at 1.36 and GFR of 37 on 01/22/2017, white blood cells elevated questionable  due to sepsis versus recent prednisone.  Results for orders placed or performed in visit on 01/28/17 (from the past 72 hour(s))  POCT Urinalysis Dipstick     Status: Abnormal   Collection Time: 01/28/17 11:04 AM  Result Value Ref Range   Color, UA YELLOW    Clarity, UA CLEAR    Glucose, UA NEGATIVE    Bilirubin, UA NEGATIVENEGATIVE    Ketones, UA NEGATIVE    Spec Grav, UA 1.015 1.010 - 1.025   Blood, UA NEGATIVE    pH, UA 6.0 5.0 - 8.0   Protein, UA 30MG    Urobilinogen, UA 0.2 0.2 or 1.0 E.U./dL   Nitrite, UA NEGATIVE    Leukocytes, UA Small (1+) (A) Negative  CBC with Differential/Platelet     Status: Abnormal   Collection Time: 01/28/17 11:36 AM  Result Value Ref Range   WBC 11.9 (H) 3.8 - 10.8 K/uL   RBC 4.06 3.80 - 5.10 MIL/uL   Hemoglobin 11.2 (L) 11.7 - 15.5 g/dL   HCT 35.5 35.0 - 45.0 %   MCV 87.4 80.0 - 100.0 fL   MCH 27.6 27.0 - 33.0 pg   MCHC 31.5 (L) 32.0 - 36.0 g/dL   RDW 15.4 (H) 11.0 - 15.0 %   Platelets 299 140 - 400 K/uL   MPV 11.2 7.5 - 12.5 fL   Neutro Abs 7,497 1,500 - 7,800 cells/uL   Lymphs Abs 2,499 850 - 3,900 cells/uL   Monocytes Absolute 1,309 (H) 200 - 950 cells/uL   Eosinophils Absolute 595 (H) 15 - 500 cells/uL   Basophils Absolute 0 0 - 200 cells/uL   Neutrophils Relative % 63 %   Lymphocytes Relative 21 %   Monocytes Relative 11 %   Eosinophils Relative 5 %   Basophils Relative 0 %   Smear Review Criteria for review not met   COMPLETE METABOLIC PANEL WITH GFR     Status: Abnormal   Collection Time: 01/28/17 11:36 AM  Result Value Ref Range   Sodium 142 135 - 146 mmol/L   Potassium 5.1 3.5 - 5.3 mmol/L   Chloride 106 98 - 110 mmol/L   CO2 25 20 - 31 mmol/L   Glucose, Bld 76 65 - 99 mg/dL   BUN 40 (H) 7 - 25 mg/dL   Creat 1.75 (H) 0.60 - 0.93 mg/dL    Comment:   For patients > or = 79 years of age: The upper reference limit for Creatinine is approximately 13% higher for people identified as African-American.      Total Bilirubin  0.6 0.2 - 1.2 mg/dL   Alkaline Phosphatase 99 33 - 130 U/L   AST 29 10 - 35 U/L   ALT 16 6 - 29 U/L   Total Protein 6.9 6.1 - 8.1 g/dL   Albumin 3.8 3.6 -  5.1 g/dL   Calcium 9.3 8.6 - 10.4 mg/dL   GFR, Est African American 32 (L) >=60 mL/min   GFR, Est Non African American 27 (L) >=60 mL/min     ASSESSMENT/PLAN:   CKD (chronic kidney disease) stage 3, GFR 30-59 ml/min - Recheck labs today after recent hospitalization with AKI likely due to sepsis/dehydration - creatinine/GFR slightly worse, continue hold lisinopril - Plan: COMPLETE METABOLIC PANEL WITH GFR  UTI due to Klebsiella species - Recheck urine culture - Plan: POCT Urinalysis Dipstick, Urine culture  Pulmonary emphysema, unspecified emphysema type ([place]) - Has nebulizer machine at home, will add duo nebs when necessary, continue Advair and rescue inhaler - Plan: ipratropium-albuterol (DUONEB) 0.5-2.5 (3) MG/3ML SOLN, guaiFENesin 200 MG tablet, CBC with Differential/Platelet  Chronic atrial fibrillation (HCC) - Coreg was increased, blood pressure a bit on the low side the patient fairly asymptomatic. Doesn't have pill bottles with her, question whether on lisinopril  Coronary artery disease due to lipid rich plaque - Would consider get back on ACE inhibitor for secondary prevention, okay to continue statin, blood pressure controlled  Essential hypertension, benign  Diabetes mellitus type 2, insulin dependent ([place]) - Patient reports that home blood sugars, continue current regimen      Follow-up plan: Return in about 4 weeks (around 02/25/2017) for coughing follow-up, diabetes checkup.  Visit summary with medication list and pertinent instructions was printed for patient to review, alert [place] if any changes needed. All questions at time of visit were answered - patient instructed to contact office with any additional concerns. ER/RTC precautions were reviewed with the patient and understanding verbalized.   Note: Total time spent  40 minutes, greater than 50% of the visit was spent face-to-face counseling and coordinating care for the following: The primary encounter diagnosis was CKD (chronic kidney disease) stage 3, GFR 30-59 ml/min. Diagnoses of UTI due to Klebsiella species, Pulmonary emphysema, unspecified emphysema type ([place]), Chronic atrial fibrillation ([place]), Coronary artery disease due to lipid rich plaque, Essential hypertension, benign, and Diabetes mellitus type 2, insulin dependent ([place]) were also pertinent to this visit.Marland Kitchen

## 2017-01-29 LAB — CBC WITH DIFFERENTIAL/PLATELET
BASOS ABS: 0 {cells}/uL (ref 0–200)
BASOS PCT: 0 %
EOS ABS: 595 {cells}/uL — AB (ref 15–500)
Eosinophils Relative: 5 %
HEMATOCRIT: 35.5 % (ref 35.0–45.0)
Hemoglobin: 11.2 g/dL — ABNORMAL LOW (ref 11.7–15.5)
LYMPHS PCT: 21 %
Lymphs Abs: 2499 cells/uL (ref 850–3900)
MCH: 27.6 pg (ref 27.0–33.0)
MCHC: 31.5 g/dL — ABNORMAL LOW (ref 32.0–36.0)
MCV: 87.4 fL (ref 80.0–100.0)
MONO ABS: 1309 {cells}/uL — AB (ref 200–950)
MONOS PCT: 11 %
MPV: 11.2 fL (ref 7.5–12.5)
NEUTROS PCT: 63 %
Neutro Abs: 7497 cells/uL (ref 1500–7800)
PLATELETS: 299 10*3/uL (ref 140–400)
RBC: 4.06 MIL/uL (ref 3.80–5.10)
RDW: 15.4 % — ABNORMAL HIGH (ref 11.0–15.0)
WBC: 11.9 10*3/uL — ABNORMAL HIGH (ref 3.8–10.8)

## 2017-01-29 LAB — COMPLETE METABOLIC PANEL WITH GFR
ALT: 16 U/L (ref 6–29)
AST: 29 U/L (ref 10–35)
Albumin: 3.8 g/dL (ref 3.6–5.1)
Alkaline Phosphatase: 99 U/L (ref 33–130)
BILIRUBIN TOTAL: 0.6 mg/dL (ref 0.2–1.2)
BUN: 40 mg/dL — AB (ref 7–25)
CALCIUM: 9.3 mg/dL (ref 8.6–10.4)
CO2: 25 mmol/L (ref 20–31)
CREATININE: 1.75 mg/dL — AB (ref 0.60–0.93)
Chloride: 106 mmol/L (ref 98–110)
GFR, EST AFRICAN AMERICAN: 32 mL/min — AB (ref >=60)
GFR, Est Non African American: 27 mL/min — ABNORMAL LOW (ref >=60)
Glucose, Bld: 76 mg/dL (ref 65–99)
Potassium: 5.1 mmol/L (ref 3.5–5.3)
Sodium: 142 mmol/L (ref 135–146)
TOTAL PROTEIN: 6.9 g/dL (ref 6.1–8.1)

## 2017-01-29 LAB — URINE CULTURE: Organism ID, Bacteria: NO GROWTH

## 2017-01-29 IMAGING — DX DG KNEE COMPLETE 4+V*L*
4 series · 4 of 4 positions shown · non-contrast
Comparison: 02/04/2015

CLINICAL DATA: Fall 1 week ago with twisting injury and pain,
initial encounter

EXAM:
LEFT KNEE - COMPLETE 4+ VIEW

[knee ap]
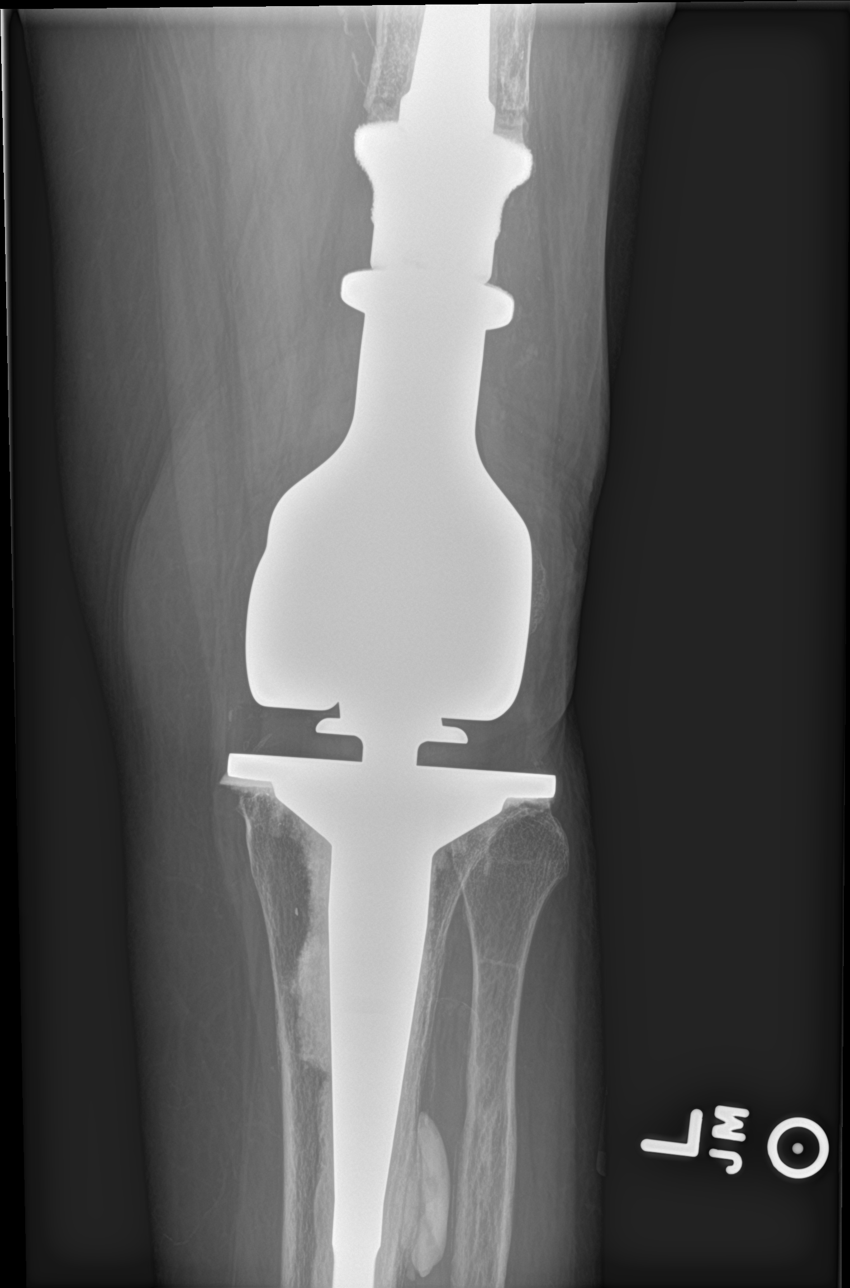

[knee lat]
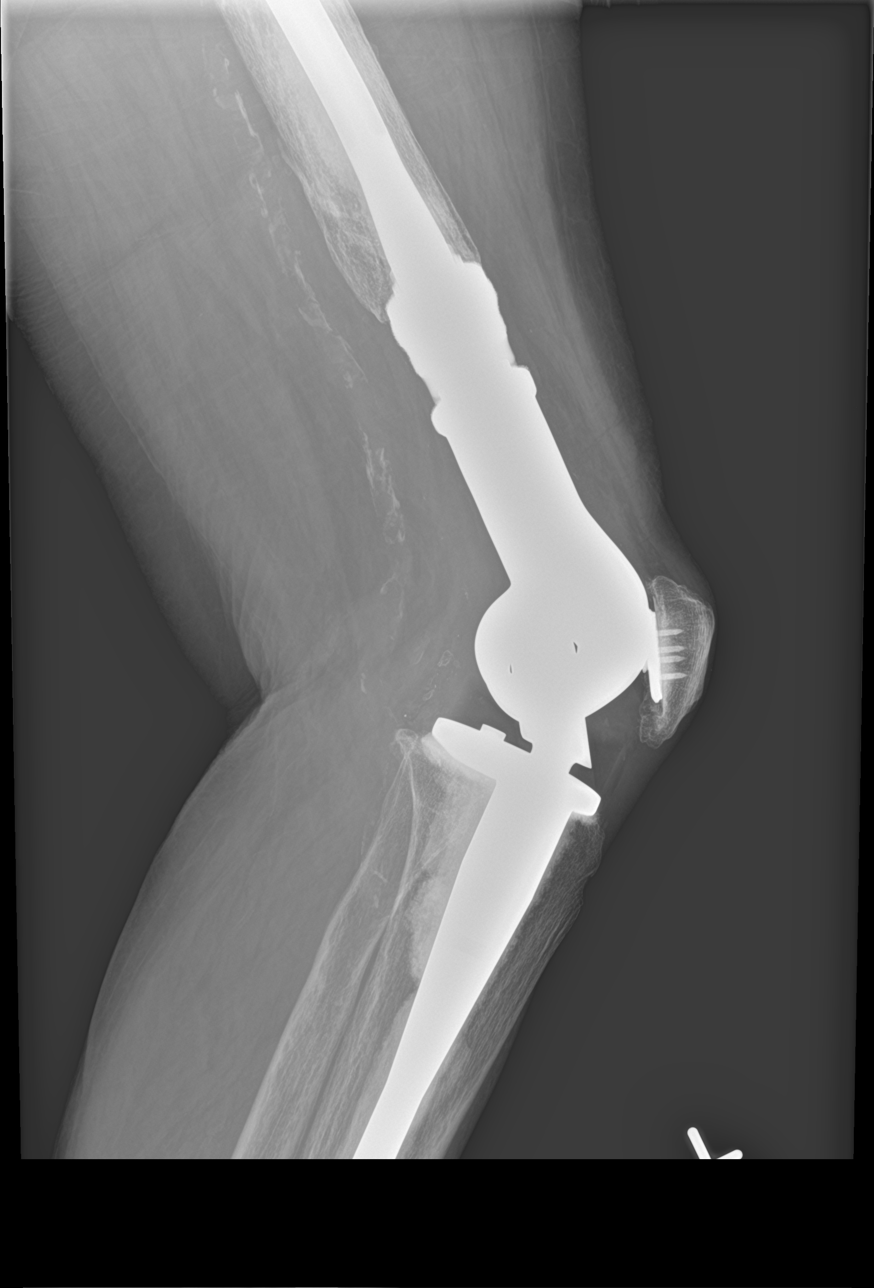

[knee obl (1 of 2)]
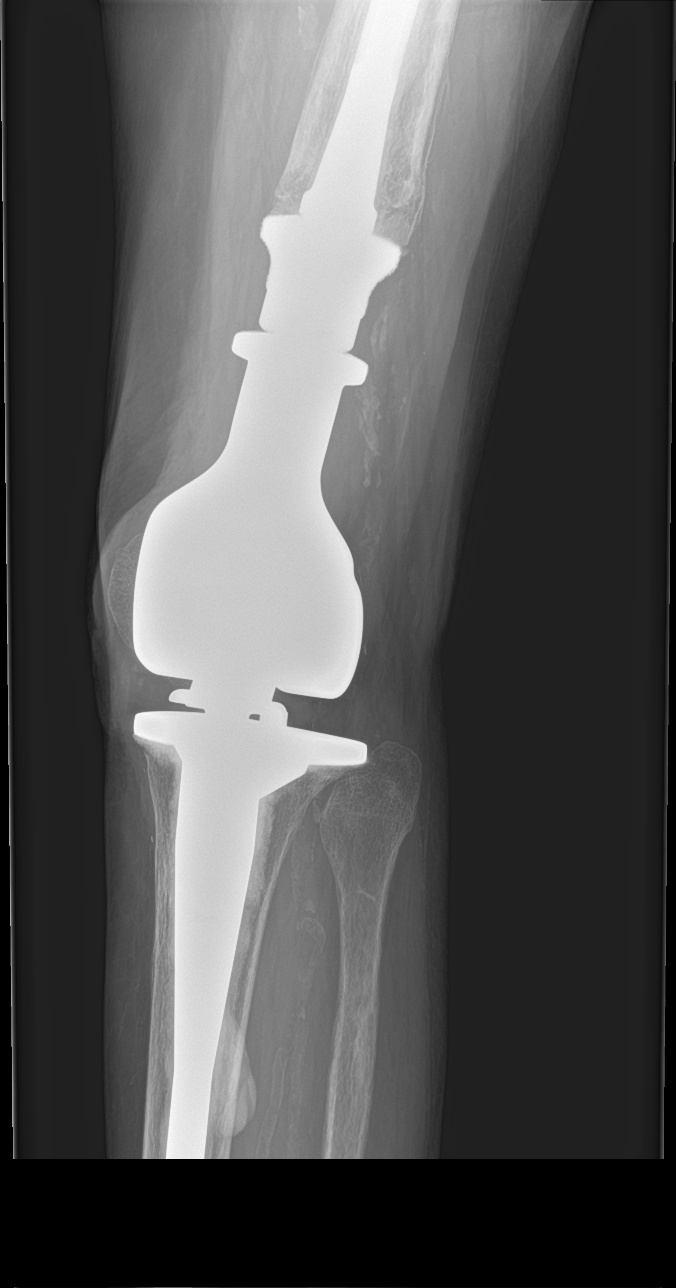

[knee obl (2 of 2)]
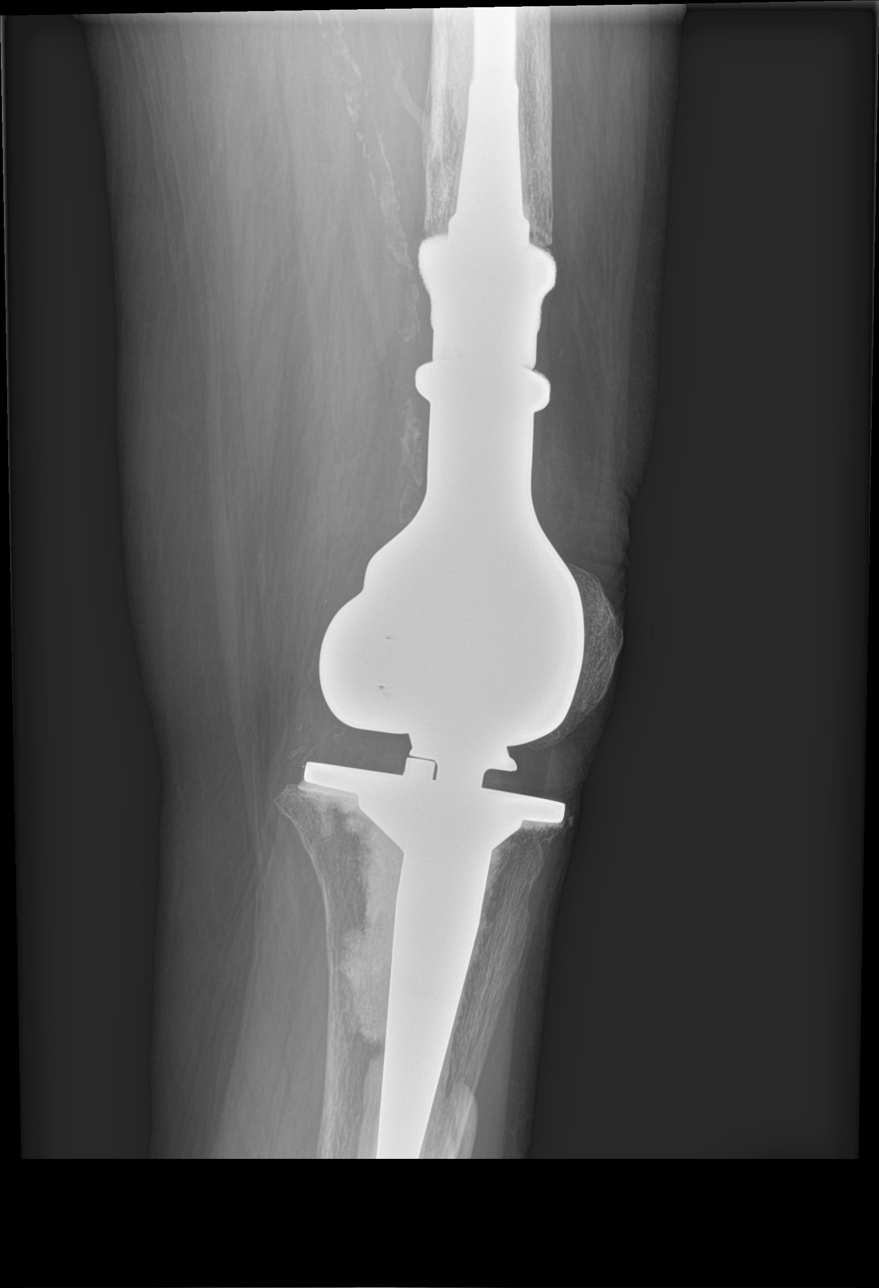

[4 of 4 positions shown; findings below may reference images not displayed]

FINDINGS: Postsurgical changes are noted consistent with the knee replacement.
No acute fracture or loosening is seen. The overall appearance is
stable from the prior exam. Diffuse vascular calcifications are
seen.
IMPRESSION: Postsurgical change without acute abnormality.

## 2017-02-01 ENCOUNTER — Telehealth: Payer: Self-pay | Admitting: Cardiology

## 2017-02-02 ENCOUNTER — Ambulatory Visit: Payer: Medicare Other | Admitting: Cardiology

## 2017-02-02 DIAGNOSIS — R05 Cough: Secondary | ICD-10-CM | POA: Diagnosis not present

## 2017-02-02 DIAGNOSIS — K219 Gastro-esophageal reflux disease without esophagitis: Secondary | ICD-10-CM | POA: Diagnosis not present

## 2017-02-02 NOTE — Progress Notes (Deleted)
HPI: FU coronary disease, cerebrovascular disease and renal artery stenosis. Previously resided in [place]. Patient underwent coronary artery bypassing graft in 2004. Last cath at [facility] July 2015 showed normal left main, subtotal LAD, normal circumflex and occluded right coronary artery. The LIMA to the LAD was patent as was the saphenous vein graft to the right coronary artery. Renal Dopplers in September 2015 showed greater than 60% left renal artery stenosis. There was an anechoic density in the right kidney. Dedicated renal ultrasound revealed 2.5 cm cyst in the upper pole of the right kidney. Carotid Dopplers March 2016 at [facility] showed no stenosis. Nuclear study April 2016 was normal. Echocardiogram December 2017 showed normal LV systolic function, moderately severe or 3+ mitral regurgitation, moderate aortic insufficiency; moderate biatrial enlargement. Patient apparently recently admitted in [place] with COPD, acute renal insufficiency, sepsis secondary to UTI. She was treated including IV fluids and antibiotics with some improvement. Since last seen,   Current Outpatient Prescriptions  Medication Sig Dispense Refill  . AMBULATORY NON FORMULARY MEDICATION Insulin needles for lantus solastar  31g x4 100 each 11  . AMBULATORY NON FORMULARY MEDICATION Diabetic Test Strips and Lancets: One Touch Ultra Blue  Dx Type 2 Diabetes.e11.22  Use to check blood sugar twice a day. 100 each 3  . atorvastatin (LIPITOR) 40 MG tablet TAKE 1 TABLET(40 MG) BY MOUTH DAILY AT 6 PM 90 tablet 0  . Calcium Carbonate-Vitamin D (CALTRATE 600+D PO) Take by mouth 2 (two) times daily.    . carvedilol (COREG) 12.5 MG tablet Take 12.5 mg by mouth 2 (two) times daily with a meal.    . cephALEXin (KEFLEX) 500 MG capsule Take 1 capsule (500 mg total) by mouth 2 (two) times daily. (Patient not taking: Reported on 01/29/2017) 14 capsule 0  . ELIQUIS 5 MG TABS tablet TAKE 1 TABLET(5 MG) BY MOUTH TWICE DAILY 60  tablet 0  . escitalopram (LEXAPRO) 5 MG tablet TAKE 1 TABLET BY MOUTH DAILY 90 tablet 0  . FeFum-FePoly-FA-B Cmp-C-Biot (INTEGRA PLUS) CAPS TAKE 1 CAPSULE BY MOUTH DAILY 90 capsule 0  . Fluticasone-Salmeterol (ADVAIR DISKUS) 500-50 MCG/DOSE AEPB Inhale 1 puff into the lungs 2 (two) times daily. 60 each 6  . gabapentin (NEURONTIN) 300 MG capsule TAKE 1 CAPSULE(300 MG) BY MOUTH THREE TIMES DAILY 90 capsule 0  . guaiFENesin 200 MG tablet Take 1 tablet (200 mg total) by mouth every 4 (four) hours as needed for cough or to loosen phlegm. 30 tablet 0  . ipratropium-albuterol (DUONEB) 0.5-2.5 (3) MG/3ML SOLN Take 3 mLs by nebulization every 2 (two) hours as needed (wheeze, SOB, cough). 60 mL 1  . IRON PO Take by mouth.    Marland Kitchen JANUVIA 50 MG tablet TAKE 1 TABLET BY MOUTH EVERY DAY 90 tablet 0  . levothyroxine (SYNTHROID, LEVOTHROID) 88 MCG tablet TAKE 1 TABLET BY MOUTH DAILY BEFORE BREAKFAST 90 tablet 0  . montelukast (SINGULAIR) 10 MG tablet TAKE 1 TABLET BY MOUTH AT BEDTIME 30 tablet 0  . Multiple Vitamin (MULTIVITAMIN) capsule Take 1 capsule by mouth.    . ONE TOUCH ULTRA TEST test strip CHECK BLOOD GLUCOSE TWICE DAILY 100 each 1  . oxyCODONE-acetaminophen (ROXICET) 5-325 MG tablet Take 1 tablet by mouth 2 (two) times daily as needed for severe pain. 30 tablet 0  . pantoprazole (PROTONIX) 40 MG tablet TAKE 1 TABLET BY MOUTH DAILY 90 tablet 0  . PROVENTIL HFA 108 (90 Base) MCG/ACT inhaler INHALE 2 PUFFS BY MOUTH EVERY 4  TO 6 HOURS AS NEEDED FOR SHORTNESS OF BREATH OR WHEEZING 6.7 g 0  . TOUJEO SOLOSTAR 300 UNIT/ML SOPN INJECT 38 UNITS UNDER THE SKIN DAILY 4.5 mL 6  . VESICARE 5 MG tablet TK 1 T PO D  11   No current facility-administered medications for this visit.      Past Medical History:  Diagnosis Date  . Asthma   . Atrial flutter ([place])   . CAD (coronary artery disease)   . Cerebrovascular disease   . Chronic renal insufficiency 02/26/2014  . COPD (chronic obstructive pulmonary disease)  ([place]) 02/26/2014  . Hypothyroid 02/26/2014  . Presence of stent in coronary artery in patient with coronary artery disease 02/26/2014  . Renal artery stenosis ([place])   . Type 2 diabetes mellitus ([place]) 02/26/2014    Past Surgical History:  Procedure Laterality Date  . ABDOMINAL HYSTERECTOMY    . APPENDECTOMY    . BACK SURGERY    . CHOLECYSTECTOMY    . CORONARY ARTERY BYPASS GRAFT     2004, [place]  . Hip replacement    . KNEE SURGERY Bilateral   . NASAL SINUS SURGERY    . SHOULDER SURGERY    . TONSILLECTOMY      Social History   Social History  . Marital status: Married    Spouse name: N/A  . Number of children: 4  . Years of education: N/A   Occupational History  . Not on file.   Social History Main Topics  . Smoking status: Never Smoker  . Smokeless tobacco: Never Used  . Alcohol use No  . Drug use: No  . Sexual activity: No   Other Topics Concern  . Not on file   Social History Narrative  . No narrative on file    Family History  Problem Relation Age of Onset  . CAD Father   . Heart attack Father     ROS: no fevers or chills, productive cough, hemoptysis, dysphasia, odynophagia, melena, hematochezia, dysuria, hematuria, rash, seizure activity, orthopnea, PND, pedal edema, claudication. Remaining systems are negative.  Physical Exam: Well-developed well-nourished in no acute distress.  Skin is warm and dry.  HEENT is normal.  Neck is supple.  Chest is clear to auscultation with normal expansion.  Cardiovascular exam is regular rate and rhythm.  Abdominal exam nontender or distended. No masses palpated. Extremities show no edema. neuro grossly intact  ECG- personally reviewed  A/P  1 Coronary artery disease-patient is not having chest pain. Continue statin. No aspirin given need for anticoagulation.  2 Permanent atrial fibrillation-continue beta blocker for rate control. Continue apixaban.   3 hypertension-blood pressure is controlled. Continue  present medications.  4 hyperlipidemia-continue statin.  5 carotid artery disease-continue statin.  6 renal artery stenosis  7 chronic stage III kidney disease-renal function monitored by primary care.  Kirk Ruths, MD

## 2017-02-03 NOTE — Telephone Encounter (Signed)
Close encounter 

## 2017-02-16 ENCOUNTER — Other Ambulatory Visit: Payer: Self-pay | Admitting: Osteopathic Medicine

## 2017-02-19 DIAGNOSIS — I110 Hypertensive heart disease with heart failure: Secondary | ICD-10-CM | POA: Diagnosis not present

## 2017-02-19 DIAGNOSIS — G301 Alzheimer's disease with late onset: Secondary | ICD-10-CM | POA: Diagnosis not present

## 2017-02-19 DIAGNOSIS — K068 Other specified disorders of gingiva and edentulous alveolar ridge: Secondary | ICD-10-CM | POA: Diagnosis not present

## 2017-02-19 DIAGNOSIS — I509 Heart failure, unspecified: Secondary | ICD-10-CM | POA: Diagnosis not present

## 2017-02-19 DIAGNOSIS — E1122 Type 2 diabetes mellitus with diabetic chronic kidney disease: Secondary | ICD-10-CM | POA: Diagnosis not present

## 2017-02-19 DIAGNOSIS — Z79899 Other long term (current) drug therapy: Secondary | ICD-10-CM | POA: Diagnosis not present

## 2017-02-19 DIAGNOSIS — Z7901 Long term (current) use of anticoagulants: Secondary | ICD-10-CM | POA: Diagnosis not present

## 2017-02-19 DIAGNOSIS — I2510 Atherosclerotic heart disease of native coronary artery without angina pectoris: Secondary | ICD-10-CM | POA: Diagnosis not present

## 2017-02-19 DIAGNOSIS — F0280 Dementia in other diseases classified elsewhere without behavioral disturbance: Secondary | ICD-10-CM | POA: Diagnosis not present

## 2017-02-19 DIAGNOSIS — E782 Mixed hyperlipidemia: Secondary | ICD-10-CM | POA: Diagnosis not present

## 2017-02-19 DIAGNOSIS — J449 Chronic obstructive pulmonary disease, unspecified: Secondary | ICD-10-CM | POA: Diagnosis not present

## 2017-02-19 DIAGNOSIS — Z794 Long term (current) use of insulin: Secondary | ICD-10-CM | POA: Diagnosis not present

## 2017-02-19 DIAGNOSIS — D68318 Other hemorrhagic disorder due to intrinsic circulating anticoagulants, antibodies, or inhibitors: Secondary | ICD-10-CM | POA: Diagnosis not present

## 2017-02-19 DIAGNOSIS — N183 Chronic kidney disease, stage 3 (moderate): Secondary | ICD-10-CM | POA: Diagnosis not present

## 2017-02-19 DIAGNOSIS — E1142 Type 2 diabetes mellitus with diabetic polyneuropathy: Secondary | ICD-10-CM | POA: Diagnosis not present

## 2017-02-22 ENCOUNTER — Other Ambulatory Visit: Payer: Self-pay

## 2017-02-22 DIAGNOSIS — Z794 Long term (current) use of insulin: Principal | ICD-10-CM

## 2017-02-22 DIAGNOSIS — N183 Chronic kidney disease, stage 3 unspecified: Secondary | ICD-10-CM

## 2017-02-22 DIAGNOSIS — E1122 Type 2 diabetes mellitus with diabetic chronic kidney disease: Secondary | ICD-10-CM

## 2017-02-22 MED ORDER — AMBULATORY NON FORMULARY MEDICATION: 3 refills | Status: DC

## 2017-02-22 MED ORDER — AMBULATORY NON FORMULARY MEDICATION: 99 refills | Status: DC

## 2017-02-25 ENCOUNTER — Encounter: Payer: Self-pay | Admitting: Family Medicine

## 2017-02-25 ENCOUNTER — Encounter: Payer: Self-pay | Admitting: Osteopathic Medicine

## 2017-02-25 ENCOUNTER — Ambulatory Visit (INDEPENDENT_AMBULATORY_CARE_PROVIDER_SITE_OTHER): Payer: Medicare Other | Admitting: Family Medicine

## 2017-02-25 ENCOUNTER — Ambulatory Visit (INDEPENDENT_AMBULATORY_CARE_PROVIDER_SITE_OTHER): Payer: Medicare Other | Admitting: Osteopathic Medicine

## 2017-02-25 VITALS — BP 138/69 | HR 69 | Wt 170.0 lb

## 2017-02-25 DIAGNOSIS — N183 Chronic kidney disease, stage 3 (moderate): Secondary | ICD-10-CM | POA: Diagnosis not present

## 2017-02-25 DIAGNOSIS — M25552 Pain in left hip: Secondary | ICD-10-CM

## 2017-02-25 DIAGNOSIS — I2583 Coronary atherosclerosis due to lipid rich plaque: Secondary | ICD-10-CM

## 2017-02-25 DIAGNOSIS — I2510 Atherosclerotic heart disease of native coronary artery without angina pectoris: Secondary | ICD-10-CM | POA: Diagnosis not present

## 2017-02-25 DIAGNOSIS — E119 Type 2 diabetes mellitus without complications: Secondary | ICD-10-CM | POA: Diagnosis not present

## 2017-02-25 DIAGNOSIS — J439 Emphysema, unspecified: Secondary | ICD-10-CM

## 2017-02-25 DIAGNOSIS — Z794 Long term (current) use of insulin: Secondary | ICD-10-CM | POA: Diagnosis not present

## 2017-02-25 DIAGNOSIS — I10 Essential (primary) hypertension: Secondary | ICD-10-CM

## 2017-02-25 DIAGNOSIS — N184 Chronic kidney disease, stage 4 (severe): Secondary | ICD-10-CM

## 2017-02-25 LAB — COMPLETE METABOLIC PANEL WITH GFR
ALBUMIN: 4 g/dL (ref 3.6–5.1)
ALK PHOS: 119 U/L (ref 33–130)
ALT: 12 U/L (ref 6–29)
AST: 28 U/L (ref 10–35)
BILIRUBIN TOTAL: 0.6 mg/dL (ref 0.2–1.2)
BUN: 32 mg/dL — ABNORMAL HIGH (ref 7–25)
CO2: 22 mmol/L (ref 20–31)
Calcium: 9.1 mg/dL (ref 8.6–10.4)
Chloride: 105 mmol/L (ref 98–110)
Creat: 1.58 mg/dL — ABNORMAL HIGH (ref 0.60–0.93)
GFR, EST AFRICAN AMERICAN: 36 mL/min — AB (ref >=60)
GFR, EST NON AFRICAN AMERICAN: 31 mL/min — AB (ref >=60)
Glucose, Bld: 149 mg/dL — ABNORMAL HIGH (ref 65–99)
Potassium: 4.7 mmol/L (ref 3.5–5.3)
Sodium: 140 mmol/L (ref 135–146)
TOTAL PROTEIN: 6.6 g/dL (ref 6.1–8.1)

## 2017-02-25 MED ORDER — UMECLIDINIUM BROMIDE 62.5 MCG/INH IN AEPB: 1.0000 | INHALATION_SPRAY | Freq: Every day | RESPIRATORY_TRACT | 6 refills | Status: DC

## 2017-02-25 MED ORDER — BENZONATATE 200 MG PO CAPS: 200.0000 mg | ORAL_CAPSULE | Freq: Three times a day (TID) | ORAL | 1 refills | Status: DC | PRN

## 2017-02-25 NOTE — Progress Notes (Signed)
Briana Werner is a 81 y.o. female who presents to [place] today for left hip pain. Patient returns today complaining of left anterior hip and thigh pain. This is been ongoing for months. She had an extensive workup over 6 months ago including x-rays as well as a bone scan to assess hardware loosening and a trial of epidural injection. No disease were helpful or treated her symptoms. She notes weakness especially with hip flexion and pain with hip flexion. She uses a walker to ambulate.   Past Medical History:  Diagnosis Date  . Asthma   . Atrial flutter ([place])   . CAD (coronary artery disease)   . Cerebrovascular disease   . Chronic renal insufficiency 02/26/2014  . COPD (chronic obstructive pulmonary disease) ([place]) 02/26/2014  . Hypothyroid 02/26/2014  . Presence of stent in coronary artery in patient with coronary artery disease 02/26/2014  . Renal artery stenosis ([place])   . Type 2 diabetes mellitus ([place]) 02/26/2014   Past Surgical History:  Procedure Laterality Date  . ABDOMINAL HYSTERECTOMY    . APPENDECTOMY    . BACK SURGERY    . CHOLECYSTECTOMY    . CORONARY ARTERY BYPASS GRAFT     2004, [place]  . Hip replacement    . KNEE SURGERY Bilateral   . NASAL SINUS SURGERY    . SHOULDER SURGERY    . TONSILLECTOMY     Social History  Substance Use Topics  . Smoking status: Never Smoker  . Smokeless tobacco: Never Used  . Alcohol use No     ROS:  As above   Medications: Current Outpatient Prescriptions  Medication Sig Dispense Refill  . AMBULATORY NON FORMULARY MEDICATION Insulin needles for lantus solastar  31g x4 100 each 11  . AMBULATORY NON FORMULARY MEDICATION Diabetic Test Strips and Lancets: One Touch Ultra Blue  Dx Type 2 Diabetes.e11.22  Use to check blood sugar twice a day. 100 each PRN  . atorvastatin (LIPITOR) 40 MG tablet TAKE 1 TABLET(40 MG) BY MOUTH DAILY AT 6 PM 90 tablet 0  . benzonatate (TESSALON) 200 MG  capsule Take 1 capsule (200 mg total) by mouth 3 (three) times daily as needed for cough. 30 capsule 1  . Calcium Carbonate-Vitamin D (CALTRATE 600+D PO) Take by mouth 2 (two) times daily.    . carvedilol (COREG) 12.5 MG tablet Take 12.5 mg by mouth 2 (two) times daily with a meal.    . ELIQUIS 5 MG TABS tablet TAKE 1 TABLET(5 MG) BY MOUTH TWICE DAILY 60 tablet 0  . escitalopram (LEXAPRO) 5 MG tablet TAKE 1 TABLET BY MOUTH DAILY 90 tablet 0  . FeFum-FePoly-FA-B Cmp-C-Biot (INTEGRA PLUS) CAPS TAKE 1 CAPSULE BY MOUTH DAILY 90 capsule 0  . Fluticasone-Salmeterol (ADVAIR DISKUS) 500-50 MCG/DOSE AEPB Inhale 1 puff into the lungs 2 (two) times daily. 60 each 6  . gabapentin (NEURONTIN) 300 MG capsule TAKE 1 CAPSULE(300 MG) BY MOUTH THREE TIMES DAILY 90 capsule 0  . ipratropium-albuterol (DUONEB) 0.5-2.5 (3) MG/3ML SOLN Take 3 mLs by nebulization every 2 (two) hours as needed (wheeze, SOB, cough). 60 mL 1  . IRON PO Take by mouth.    Marland Kitchen JANUVIA 50 MG tablet TAKE 1 TABLET BY MOUTH EVERY DAY 90 tablet 0  . levothyroxine (SYNTHROID, LEVOTHROID) 88 MCG tablet TAKE 1 TABLET BY MOUTH DAILY BEFORE BREAKFAST 90 tablet 0  . montelukast (SINGULAIR) 10 MG tablet TAKE 1 TABLET BY MOUTH AT BEDTIME 30 tablet 0  .  Multiple Vitamin (MULTIVITAMIN) capsule Take 1 capsule by mouth.    . ONE TOUCH ULTRA TEST test strip CHECK BLOOD GLUCOSE TWICE DAILY 100 each 1  . oxyCODONE-acetaminophen (ROXICET) 5-325 MG tablet Take 1 tablet by mouth 2 (two) times daily as needed for severe pain. 30 tablet 0  . pantoprazole (PROTONIX) 40 MG tablet TAKE 1 TABLET BY MOUTH DAILY 90 tablet 0  . PROVENTIL HFA 108 (90 Base) MCG/ACT inhaler INHALE 2 PUFFS BY MOUTH EVERY 4 TO 6 HOURS AS NEEDED FOR SHORTNESS OF BREATH OR WHEEZING 6.7 g 0  . TOUJEO SOLOSTAR 300 UNIT/ML SOPN INJECT 38 UNITS UNDER THE SKIN DAILY 4.5 mL 6  . umeclidinium bromide (INCRUSE ELLIPTA) 62.5 MCG/INH AEPB Inhale 1 puff into the lungs daily. 1 each 6  . VESICARE 5 MG tablet TK  1 T PO D  11   No current facility-administered medications for this visit.    Allergies  Allergen Reactions  . Tetracycline Shortness Of Breath  . Tetracyclines & Related Anaphylaxis and Shortness Of Breath    dizziness  . Other Dermatitis    Bandaids   . Amlodipine     Edema  . Amoxicillin     Questionable rash  . Bactrim [Sulfamethoxazole-Trimethoprim] Other (See Comments)    dizzy dizzy  . Latex Dermatitis    Bandages, pulls skin  . Tape Dermatitis    Bandaids  Bandaids      Exam:  BP 138/69   Pulse 69   Wt 170 lb (77.1 kg)   BMI 26.63 kg/m  General: Well Developed, well nourished, and in no acute distress.  Neuro/Psych: Alert and oriented x3, extra-ocular muscles intact, able to move all 4 extremities, sensation grossly intact. Skin: Warm and dry, no rashes noted.  Respiratory: Not using accessory muscles, speaking in full sentences, trachea midline.  Cardiovascular: Pulses palpable, no extremity edema. Abdomen: Does not appear distended. MSK:  Left hip normal-appearing nontender decreased motion due to pain. Decreased flexion strength. Antalgic gait using a walker present.  Left hip and knee revealing prosthesis present and reviewed.    No results found for this or any previous visit (from the past 48 hour(s)). No results found.    Assessment and Plan: 81 y.o. female with left hip pain. Doubtful for hardware loosening based on recent bone scan. At this point she really doesn't have a joint to replace that could be causing pain. I don't think lumbar radiculopathy is likely because she didn't have any benefit from epidural injection. I think it's likely musculoskeletal weakness. Plan for referral to physical therapy and recheck in 6 weeks.    Orders Placed This Encounter  Procedures  . Ambulatory referral to Physical Therapy    Referral Priority:   Routine    Referral Type:   Physical Medicine    Referral Reason:   Specialty Services Required     Requested Specialty:   Physical Therapy    Number of Visits Requested:   1   No orders of the defined types were placed in this encounter.   Discussed warning signs or symptoms. Please see discharge instructions. Patient expresses understanding.

## 2017-02-25 NOTE — Progress Notes (Signed)
HPI: Briana Werner is a 81 y.o. female  who presents to [place] today, 02/25/17,  for chief complaint of:  Chief Complaint  Patient presents with  . Follow-up    DIABETES AND COUGH    Follow-up for diabetes: She has stopped checking fasting sugars since these seem to be fluctuating a bit, she is not taking sugar readings mostly in the evenings. Typically in mid 100s to 200s range. She states her fasting blood sugars were typically 100 to 150s or so. She never experienced any hypoglycemia by glucometer reading or by symptoms. Compliant with insulin as noted in medication list.  Chronic kidney disease: Back to baseline after AKI in hospitalization. Patient states she only has one kidney, not sure about a surgery for this but I reviewed symptoms CT results from 2017 and both kidneys are present on that imaging study.  Cough/COPD: Reports persistent dry cough, typically worse in the evenings. Has been doing well taking benzonatate prior to going to bed. No shortness of breath. Complaint with inhalers as noted below, on Advair and rarely uses rescue inhaler/nebulizer  Hypertension: No home blood pressures to report, no shortness of breath/chest pain. Patient thinks she is taking medications as noted below but daughter manages pills, need to confirm with her   Past medical history, surgical history, social history and family history reviewed.  Patient Active Problem List   Diagnosis Date Noted  . Nasal polyposis 12/07/2016  . Cough 07/13/2016  . Tinea pedis 05/25/2016  . Diabetic foot ulcer ([place]) 05/25/2016  . Left hip pain 04/15/2016  . Lumbar radicular pain 01/15/2016  . Aortic atherosclerosis ([place]) 01/12/2016  . Anemia 02/16/2015  . Atrial fibrillation ([place]) 12/25/2014  . Diabetes mellitus with kidney complication ([place]) 02/77/4128  . Spondylisthesis 05/31/2014  . CAD (coronary artery disease) 05/01/2014  . Renal artery stenosis ([place]) 05/01/2014  .  Generalized anxiety disorder 04/03/2014  . CKD (chronic kidney disease) stage 3, GFR 30-59 ml/min 03/26/2014  . Chronic atrial flutter ([place]) 03/19/2014  . Carotid artery stenosis 03/19/2014  . Hypercalcemia 02/27/2014  . Essential hypertension, benign 02/26/2014  . Hypothyroid 02/26/2014  . Hyperlipidemia 02/26/2014  . COPD (chronic obstructive pulmonary disease) ([place]) 02/26/2014  . Dementia 02/26/2014  . Presence of stent in coronary artery in patient with coronary artery disease 02/26/2014  . Nephrolithiasis 02/26/2014  . Diabetic peripheral neuropathy ([place]) 02/26/2014  . DVT, lower extremity, recurrent ([place]) 02/26/2014  . Status post left hip replacement 02/26/2014  . Overactive bladder 02/26/2014    Current medication list and allergy/intolerance information reviewed.   Current Outpatient Prescriptions on File Prior to Visit  Medication Sig Dispense Refill  . AMBULATORY NON FORMULARY MEDICATION Insulin needles for lantus solastar  31g x4 100 each 11  . AMBULATORY NON FORMULARY MEDICATION Diabetic Test Strips and Lancets: One Touch Ultra Blue  Dx Type 2 Diabetes.e11.22  Use to check blood sugar twice a day. 100 each PRN  . atorvastatin (LIPITOR) 40 MG tablet TAKE 1 TABLET(40 MG) BY MOUTH DAILY AT 6 PM 90 tablet 0  . Calcium Carbonate-Vitamin D (CALTRATE 600+D PO) Take by mouth 2 (two) times daily.    . carvedilol (COREG) 12.5 MG tablet Take 12.5 mg by mouth 2 (two) times daily with a meal.    . ELIQUIS 5 MG TABS tablet TAKE 1 TABLET(5 MG) BY MOUTH TWICE DAILY 60 tablet 0  . escitalopram (LEXAPRO) 5 MG tablet TAKE 1 TABLET BY MOUTH DAILY 90 tablet 0  . FeFum-FePoly-FA-B  Cmp-C-Biot (INTEGRA PLUS) CAPS TAKE 1 CAPSULE BY MOUTH DAILY 90 capsule 0  . Fluticasone-Salmeterol (ADVAIR DISKUS) 500-50 MCG/DOSE AEPB Inhale 1 puff into the lungs 2 (two) times daily. 60 each 6  . gabapentin (NEURONTIN) 300 MG capsule TAKE 1 CAPSULE(300 MG) BY MOUTH THREE TIMES DAILY 90 capsule 0  .  ipratropium-albuterol (DUONEB) 0.5-2.5 (3) MG/3ML SOLN Take 3 mLs by nebulization every 2 (two) hours as needed (wheeze, SOB, cough). 60 mL 1  . IRON PO Take by mouth.    Marland Kitchen JANUVIA 50 MG tablet TAKE 1 TABLET BY MOUTH EVERY DAY 90 tablet 0  . levothyroxine (SYNTHROID, LEVOTHROID) 88 MCG tablet TAKE 1 TABLET BY MOUTH DAILY BEFORE BREAKFAST 90 tablet 0  . montelukast (SINGULAIR) 10 MG tablet TAKE 1 TABLET BY MOUTH AT BEDTIME 30 tablet 0  . Multiple Vitamin (MULTIVITAMIN) capsule Take 1 capsule by mouth.    . ONE TOUCH ULTRA TEST test strip CHECK BLOOD GLUCOSE TWICE DAILY 100 each 1  . oxyCODONE-acetaminophen (ROXICET) 5-325 MG tablet Take 1 tablet by mouth 2 (two) times daily as needed for severe pain. 30 tablet 0  . pantoprazole (PROTONIX) 40 MG tablet TAKE 1 TABLET BY MOUTH DAILY 90 tablet 0  . PROVENTIL HFA 108 (90 Base) MCG/ACT inhaler INHALE 2 PUFFS BY MOUTH EVERY 4 TO 6 HOURS AS NEEDED FOR SHORTNESS OF BREATH OR WHEEZING 6.7 g 0  . TOUJEO SOLOSTAR 300 UNIT/ML SOPN INJECT 38 UNITS UNDER THE SKIN DAILY 4.5 mL 6  . VESICARE 5 MG tablet TK 1 T PO D  11   No current facility-administered medications on file prior to visit.    Allergies  Allergen Reactions  . Tetracycline Shortness Of Breath  . Tetracyclines & Related Anaphylaxis and Shortness Of Breath    dizziness  . Other Dermatitis    Bandaids   . Amlodipine     Edema  . Amoxicillin     Questionable rash  . Bactrim [Sulfamethoxazole-Trimethoprim] Other (See Comments)    dizzy dizzy  . Latex Dermatitis    Bandages, pulls skin  . Tape Dermatitis    Bandaids  Bandaids       Review of Systems:  Constitutional: No recent illness, feels well today   Cardiac: No  chest pain, No  pressure, No palpitations  Respiratory:  No  shortness of breath. No  Cough  Gastrointestinal: No  abdominal pain  Musculoskeletal: No new myalgia/arthralgia  Skin: No  Rash  Neurologic: No  weakness, No  Dizziness  Psychiatric: No  concerns  with depression, No  concerns with anxiety  Exam:  BP 138/69   Pulse 69   Wt 170 lb (77.1 kg)   BMI 26.63 kg/m   Constitutional: VS see above. General Appearance: alert, well-developed, well-nourished, NAD  Eyes: Normal lids and conjunctive, non-icteric sclera  Ears, Nose, Mouth, Throat: MMM, Normal external inspection ears/nares/mouth/lips/gums.  Neck: No masses, trachea midline.   Respiratory: Normal respiratory effort. no wheeze, no rhonchi, no rales  Cardiovascular: S1/S2 normal, no murmur, no rub/gallop auscultated. RRR.   Musculoskeletal: Gait normal. Symmetric and independent movement of all extremities  Neurological: Normal balance/coordination. No tremor.  Skin: warm, dry, intact.   Psychiatric: Normal judgment/insight. Normal mood and affect. Oriented x3.      ASSESSMENT/PLAN:   Essential hypertension, benign - confirm meds w/ daughter  CKD (chronic kidney disease) stage 4, GFR 15-29 ml/min ([facility]) - Plan: COMPLETE METABOLIC PANEL WITH GFR, Ambulatory referral to Nephrology  Pulmonary emphysema, unspecified emphysema type ([place]) -  Plan: umeclidinium bromide (INCRUSE ELLIPTA) 62.5 MCG/INH AEPB  Diabetes mellitus type 2, insulin dependent ([place]) - unable to assess w/ POC lab - mechanical issues, will get serum blood draw - Plan: COMPLETE METABOLIC PANEL WITH GFR, Hemoglobin A1c    Patient Instructions  Plan:  Labs today  Adding inhaler for coughing - if this doesn't help, may need to think about lung specialist  Will get st up with kidney doctor   Need to verify your medications with your daughter - have her look at the list on the end of this print-out     Follow-up plan: Return in about 3 months (around 05/28/2017) for recheck diabetes, sooner if needed.  Visit summary with medication list and pertinent instructions was printed for patient to review, alert [place] if any changes needed. All questions at time of visit were answered - patient instructed to  contact office with any additional concerns. ER/RTC precautions were reviewed with the patient and understanding verbalized.

## 2017-02-25 NOTE — Patient Instructions (Addendum)
Plan:  Labs today  Adding inhaler for coughing - if this doesn't help, may need to think about lung specialist  Will get st up with kidney doctor   Need to verify your medications with your daughter - have her look at the list on the end of this print-out

## 2017-02-25 NOTE — Patient Instructions (Signed)
Thank you for coming in today. Attend PT.  Recheck in 6 weeks,

## 2017-02-26 LAB — HEMOGLOBIN A1C
Hgb A1c MFr Bld: 6.3 % — ABNORMAL HIGH (ref <5.7)
MEAN PLASMA GLUCOSE: 134 mg/dL

## 2017-02-28 ENCOUNTER — Telehealth: Payer: Self-pay

## 2017-02-28 ENCOUNTER — Other Ambulatory Visit: Payer: Self-pay | Admitting: Osteopathic Medicine

## 2017-02-28 DIAGNOSIS — J439 Emphysema, unspecified: Secondary | ICD-10-CM

## 2017-02-28 MED ORDER — BENZONATATE 200 MG PO CAPS: 200.0000 mg | ORAL_CAPSULE | Freq: Three times a day (TID) | ORAL | 1 refills | Status: DC | PRN

## 2017-02-28 MED ORDER — UMECLIDINIUM BROMIDE 62.5 MCG/INH IN AEPB
1.0000 | INHALATION_SPRAY | Freq: Every day | RESPIRATORY_TRACT | 6 refills | Status: DC
Start: 2017-02-28 — End: 2017-10-03

## 2017-02-28 NOTE — Progress Notes (Signed)
Stopped Januvia 

## 2017-02-28 NOTE — Telephone Encounter (Signed)
Patient requested that Benzonatate and Umeclidinium be sent to [facility] in [place]. Izaac Reisig,CMA

## 2017-03-02 ENCOUNTER — Other Ambulatory Visit: Payer: Self-pay | Admitting: Osteopathic Medicine

## 2017-03-02 ENCOUNTER — Ambulatory Visit (INDEPENDENT_AMBULATORY_CARE_PROVIDER_SITE_OTHER): Payer: Medicare Other | Admitting: Physical Therapy

## 2017-03-02 ENCOUNTER — Encounter: Payer: Self-pay | Admitting: Physical Therapy

## 2017-03-02 DIAGNOSIS — B029 Zoster without complications: Secondary | ICD-10-CM

## 2017-03-02 DIAGNOSIS — M6281 Muscle weakness (generalized): Secondary | ICD-10-CM

## 2017-03-02 DIAGNOSIS — M25552 Pain in left hip: Secondary | ICD-10-CM

## 2017-03-02 DIAGNOSIS — M62838 Other muscle spasm: Secondary | ICD-10-CM | POA: Diagnosis not present

## 2017-03-02 DIAGNOSIS — R293 Abnormal posture: Secondary | ICD-10-CM

## 2017-03-02 DIAGNOSIS — M25652 Stiffness of left hip, not elsewhere classified: Secondary | ICD-10-CM | POA: Diagnosis not present

## 2017-03-02 NOTE — Patient Instructions (Addendum)
Leg Lengthener / Leg Press Combo: Double Leg    Straighten one leg down to floor. Pull toes AND forefeet toward knees; extend heels. Lengthen legs by pulling pelvis away from ribs. Press legs down. DO NOT BEND KNEES. Hold _5__ seconds, repeat 10 reps, once a day. Relax legs. Re-bend knee. Repeat on the other leg.   Bridging    Slowly raise buttocks from floor, keeping stomach tight. Repeat __10__ times per set. Do __2__ sets per session. Do __1__ sessions per day.   Piriformis Stretch, Supine - START WITH ONLY RESTING ANKLE ON KNEE    Lie supine, legs bent, feet flat. Raise one bent leg and, grasping ankle with both hands, pull leg toward opposite shoulder. Hold _20__ seconds.  Repeat _1-2__ times per session. Do __1_ sessions per day. Perform with other leg straight.  AMBULATION: Walk Backward  - do next to a counter top for safety. Stand tall    Walk backward. Take large steps, do not drag feet. _2-3__ reps per set, __1_ sets per day, _7__ days per week Use assistive device if needed.    AMBULATION: Side Step - do next to a counter for safety, stand tall    Step sideways. Repeat in opposite direction. _2-3__ reps per set, _1__ sets per day, _7__ days per week Use assistive device if needed. . Copyright  [employer]. All rights reserved.

## 2017-03-02 NOTE — Therapy (Signed)
[facility] [place], [place], [address] Phone: [phone]   Fax:  [phone]  Physical Therapy Evaluation  Patient Details  Name: Briana Werner MRN: [id number] Date of Birth: Jun 26, 1936 Referring Provider: Dr Georgina Snell  Encounter Date: 03/02/2017      PT End of Session - 03/02/17 1107    Visit Number 1   Number of Visits 12   Date for PT Re-Evaluation 04/13/17   PT Start Time 1107   PT Stop Time 1200   PT Time Calculation (min) 53 min   Activity Tolerance Patient tolerated treatment well      Past Medical History:  Diagnosis Date  . Asthma   . Atrial flutter ([place])   . CAD (coronary artery disease)   . Cerebrovascular disease   . Chronic renal insufficiency 02/26/2014  . COPD (chronic obstructive pulmonary disease) ([place]) 02/26/2014  . Hypothyroid 02/26/2014  . Presence of stent in coronary artery in patient with coronary artery disease 02/26/2014  . Renal artery stenosis ([place])   . Type 2 diabetes mellitus ([place]) 02/26/2014    Past Surgical History:  Procedure Laterality Date  . ABDOMINAL HYSTERECTOMY    . APPENDECTOMY    . BACK SURGERY    . CHOLECYSTECTOMY    . CORONARY ARTERY BYPASS GRAFT     2004, [place]  . Hip replacement    . KNEE SURGERY Bilateral   . NASAL SINUS SURGERY    . SHOULDER SURGERY    . TONSILLECTOMY      There were no vitals filed for this visit.       Subjective Assessment - 03/02/17 1108    Subjective Pt reports she developed Lt LE pain about 2 yrs ago, she states the pain is more in her Leg and her hip,  uses a RW at all times except very short distances in the apartment. Pain wakes her with sleeping in her hip down to the shin.    Pertinent History Lt TKA /tibia replacement 3 times, last one was 2015.  Lt hip THA 2014, back surgery long time ago, DM, kidney issues Rt side not draining.    How long can you sit comfortably? no limitations   How long can you stand  comfortably? immediate pain   How long can you walk comfortably? immediate pain   Patient Stated Goals walk more gently with less difficulty, feel more comfortable not with walker for standing and short distances in her apartment.    Currently in Pain? Yes   Pain Score 5    Pain Location Hip   Pain Orientation Left;Lateral   Pain Descriptors / Indicators Aching   Pain Type Chronic pain   Pain Radiating Towards to front of Lt ankle   Pain Onset More than a month ago   Pain Frequency Intermittent   Aggravating Factors  standing and walking   Pain Relieving Factors medication, sitting with feet elevated.             [facility] PT Assessment - 03/02/17 0001      Assessment   Medical Diagnosis 04/04/17   Referring Provider Dr Georgina Snell   Onset Date/Surgical Date 03/03/15   Hand Dominance Right   Next MD Visit 04/04/17   Prior Therapy not for this     Precautions   Precautions None     Balance Screen   Has the patient fallen in the past 6 months No     [place]  residence   Living Arrangements Spouse/significant other   Available Help at Discharge --  she has to help her husband with transfers/ADLs   [place] - 2 wheels;Shower seat - built in;Grab bars - tub/shower;Grab bars - toilet     Prior Function   Level of Independence Independent   Leisure tend her garden, has to help husband with ADLs, read, embrodiery/sewing     Observation/Other Assessments   Focus on Therapeutic Outcomes (FOTO)  55% limited     Posture/Postural Control   Posture/Postural Control Postural limitations   Postural Limitations Flexed trunk;Weight shift right  trunk shift to the Rt     ROM / Strength   AROM / PROM / Strength AROM;Strength     AROM   AROM Assessment Site Lumbar   Lumbar Flexion to mid shin   Lumbar Extension missing ~ 10 degrees extenstion   Lumbar - Right Rotation 25% present  pain in back   Lumbar - Left Rotation 10% present  with  pain in back     Strength   Strength Assessment Site Hip;Knee;Ankle   Right/Left Hip Left;Right   Right Hip Flexion 5/5   Right Hip ABduction 5/5   Left Hip Flexion 3-/5   Left Hip ABduction 3+/5  pain   Right/Left Knee Left  Rt WNL, pain with Lt LE testing    Left Knee Flexion 4/5   Left Knee Extension 4-/5   Right/Left Ankle --  bilat WNL     Flexibility   Soft Tissue Assessment /Muscle Length yes   Hamstrings supine SLR Rt 92, Lt 92   ITB very tight Lt side   Piriformis tight Lt side      Palpation   Palpation comment tenderness around Lt hip joint, tightness in the Lt gluts, ITB, qaud and HS with palpation      Bed Mobility   Bed Mobility --  independent     Transfers   Transfers Independent with all Transfers     Ambulation/Gait   Ambulation Distance (Feet) --  observed in clinic   Assistive device Rolling walker   Gait Pattern Wide base of support;Trunk flexed;Lateral trunk lean to right;Antalgic            Objective measurements completed on examination: See above findings.          [place] [facility] Adult PT Treatment/Exercise - 03/02/17 0001      Exercises   Exercises Knee/Hip     Knee/Hip Exercises: Stretches   Piriformis Stretch Left;1 rep;20 seconds     Knee/Hip Exercises: Standing   Other Standing Knee Exercises side stepping and BWD, VC for safety and standing tall.      Knee/Hip Exercises: Supine   Bridges Strengthening;Both;10 reps   Other Supine Knee/Hip Exercises 10 reps leg lenthens and press each side.                 PT Education - 03/02/17 1142    Education provided Yes   Education Details HEP, standing tall with being upright   Person(s) Educated Patient   Methods Demonstration;Explanation;Handout   Comprehension Returned demonstration;Verbalized understanding             PT Long Term Goals - 03/02/17 1214      PT LONG TERM GOAL #1   Title I with advanced HEP ( 04/13/17)    Time 6   Period Weeks   Status New      PT LONG TERM GOAL #2   Title maintain  FOTO within 5 points of intial ( 55% limited), CK level ( 04/13/17)    Time 6   Period Weeks   Status New     PT LONG TERM GOAL #3   Title ambulate with upright posture without cuing using least restrictive assistive device ( 04/13/17)    Time 6   Period Weeks   Status New     PT LONG TERM GOAL #4   Title improve strength Lt hip =/> 4+/5 to assist with her gardening ( 04/13/17)    Time 6   Period Weeks   Status New     PT LONG TERM GOAL #5   Title report =/> 50% reduction of Lt LE pain with walking ( 04/13/17)    Time 6   Period Weeks   Status New                Plan - 2017/03/14 1154    Clinical Impression Statement Paulla Dolly presents with c/o Lt hip and leg pain. She has had multiple LE surgeries and back surgery over the years.  She has to help care for her husband, assist with transfers and ADLs. She presents with postural changes, flexed forward with ambulating using RW, weakness and stiffness in the Lt hip along with pain.    History and Personal Factors relevant to plan of care: multiple Lt knee surgeries, Lt THA, Rt TKA, back surgery, DM, kidney issues. Pt also reports h/o pelvis fx   Clinical Presentation Evolving   Clinical Decision Making Moderate   Rehab Potential Good   PT Frequency 2x / week   PT Duration 6 weeks   PT Treatment/Interventions Moist Heat;Ultrasound;Therapeutic exercise;Dry needling;Taping;Vasopneumatic Device;Manual techniques;Neuromuscular re-education;DME Instruction;Cryotherapy;Electrical Stimulation;Iontophoresis 4mg /ml Dexamethasone;Patient/family education   PT Next Visit Plan hip stretches, hip/core strengthening and manual work to [employer] and Agree with Plan of Care Patient      Patient will benefit from skilled therapeutic intervention in order to improve the following deficits and impairments:  Abnormal gait, Decreased range of motion, Difficulty walking, Increased muscle spasms,  Pain, Improper body mechanics, Impaired flexibility, Decreased strength  Visit Diagnosis: Pain in left hip - Plan: PT plan of care cert/re-cert  Muscle weakness (generalized) - Plan: PT plan of care cert/re-cert  Abnormal posture - Plan: PT plan of care cert/re-cert  Stiffness of left hip, not elsewhere classified - Plan: PT plan of care cert/re-cert  Other muscle spasm - Plan: PT plan of care cert/re-cert      [facility] PT PB G-CODES - Mar 14, 2017 12/17/1215    Functional Assessment Tool Used  FOTO and professional judegement    Functional Limitations Mobility: Walking and moving around   Mobility: Walking and Moving Around Current Status At least 60 percent but less than 80 percent impaired, limited or restricted   Mobility: Walking and Moving Around Goal Status [phone]) At least 40 percent but less than 60 percent impaired, limited or restricted       Problem List Patient Active Problem List   Diagnosis Date Noted  . CKD (chronic kidney disease) stage 4, GFR 15-29 ml/min (HCC) 02/25/2017  . Nasal polyposis 12/07/2016  . Cough 07/13/2016  . Tinea pedis 05/25/2016  . Diabetic foot ulcer ([place]) 05/25/2016  . Left hip pain 04/15/2016  . Lumbar radicular pain 01/15/2016  . Aortic atherosclerosis ([place]) 01/12/2016  . Anemia 02/16/2015  . Atrial fibrillation ([place]) 12/25/2014  . Diabetes mellitus with kidney complication ([place]) 42/59/5638  . Spondylisthesis 05/31/2014  . CAD (  coronary artery disease) 05/01/2014  . Renal artery stenosis ([place]) 05/01/2014  . Generalized anxiety disorder 04/03/2014  . CKD (chronic kidney disease) stage 3, GFR 30-59 ml/min 03/26/2014  . Chronic atrial flutter ([place]) 03/19/2014  . Carotid artery stenosis 03/19/2014  . Hypercalcemia 02/27/2014  . Essential hypertension, benign 02/26/2014  . Hypothyroid 02/26/2014  . Hyperlipidemia 02/26/2014  . COPD (chronic obstructive pulmonary disease) ([place]) 02/26/2014  . Dementia 02/26/2014  . Presence of stent in coronary  artery in patient with coronary artery disease 02/26/2014  . Nephrolithiasis 02/26/2014  . Diabetic peripheral neuropathy ([place]) 02/26/2014  . DVT, lower extremity, recurrent ([place]) 02/26/2014  . Status post left hip replacement 02/26/2014  . Overactive bladder 02/26/2014    Jeral Pinch PT  03/02/2017, 12:19 PM  [facility] [place], [place], [address] Phone: [phone]   Fax:  [phone]  Name: Liz Pinho MRN: [id number] Date of Birth: 1935-11-25

## 2017-03-09 ENCOUNTER — Ambulatory Visit (INDEPENDENT_AMBULATORY_CARE_PROVIDER_SITE_OTHER): Payer: Medicare Other | Admitting: Physical Therapy

## 2017-03-09 DIAGNOSIS — M6281 Muscle weakness (generalized): Secondary | ICD-10-CM

## 2017-03-09 DIAGNOSIS — M25652 Stiffness of left hip, not elsewhere classified: Secondary | ICD-10-CM | POA: Diagnosis not present

## 2017-03-09 DIAGNOSIS — R293 Abnormal posture: Secondary | ICD-10-CM | POA: Diagnosis not present

## 2017-03-09 DIAGNOSIS — M25552 Pain in left hip: Secondary | ICD-10-CM

## 2017-03-09 NOTE — Therapy (Signed)
[facility], [place], [address] Phone: [phone]   Fax:  [phone]  Physical Therapy Treatment  Patient Details  Name: Briana Werner MRN: [id number] Date of Birth: 1935-09-15 Referring Provider: Dr. Georgina Snell   Encounter Date: 03/09/2017      PT End of Session - 03/09/17 0939    Visit Number 2   Number of Visits 12   Date for PT Re-Evaluation 04/13/17   PT Start Time 0933   PT Stop Time 1034   PT Time Calculation (min) 61 min      Past Medical History:  Diagnosis Date  . Asthma   . Atrial flutter ([place])   . CAD (coronary artery disease)   . Cerebrovascular disease   . Chronic renal insufficiency 02/26/2014  . COPD (chronic obstructive pulmonary disease) ([place]) 02/26/2014  . Hypothyroid 02/26/2014  . Presence of stent in coronary artery in patient with coronary artery disease 02/26/2014  . Renal artery stenosis ([place])   . Type 2 diabetes mellitus ([place]) 02/26/2014    Past Surgical History:  Procedure Laterality Date  . ABDOMINAL HYSTERECTOMY    . APPENDECTOMY    . BACK SURGERY    . CHOLECYSTECTOMY    . CORONARY ARTERY BYPASS GRAFT     2004, [place]  . Hip replacement    . KNEE SURGERY Bilateral   . NASAL SINUS SURGERY    . SHOULDER SURGERY    . TONSILLECTOMY      There were no vitals filed for this visit.      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  She uses RW as support when she is working on any projects in standing.    Pertinent History Lt TKA /tibia replacement 3 times, last one was 2015.  Lt hip THA 2014, back surgery long time ago, DM, kidney issues Rt side not draining.    Currently in Pain? Yes   Pain Score 5    Pain Location Hip   Pain Orientation Left;Anterior;Posterior;Lateral   Pain Descriptors / Indicators Sore   Aggravating Factors  standing and walking   Pain Relieving Factors  medicatio, sitting with feet elevated.             [facility] PT Assessment - 03/09/17 0001      Assessment   Medical Diagnosis 04/04/17   Referring Provider Dr. Georgina Snell    Onset Date/Surgical Date 03/03/15   Hand Dominance Right   Next MD Visit 04/04/17   Prior Therapy not for this     Palpation   Palpation comment point tender along prox Lt quad, piriformis.             [place] Adult PT Treatment/Exercise - 03/09/17 0001      Self-Care   Self-Care Other Self-Care Comments   Other Self-Care Comments  Educated pt on self massage to Lt thigh to decrease tightness; pt verbalized understanding.      Knee/Hip Exercises: Stretches   Piriformis Stretch Left;3 reps;30 seconds     Knee/Hip Exercises: Supine   Heel Slides Left;1 set;10 reps   Bridges 2 sets;10 reps  3-5 sec hold in extension   Other Supine Knee/Hip Exercises 10 reps leg lengtheners and press Lt side, 3 sec hold.    Other Supine Knee/Hip Exercises TA contraction in hooklying x 5 sec x 10 reps      Modalities   Modalities Electrical  Stimulation;Moist Heat     Moist Heat Therapy   Number Minutes Moist Heat 20 Minutes   Moist Heat Location Hip  Lt     Electrical Stimulation   Electrical Stimulation Location Lt ant thigh   Electrical Stimulation Action premod   Electrical Stimulation Parameters to tolerance    Electrical Stimulation Goals Pain     Manual Therapy   Manual therapy comments STM to Lt ant quad, hip flexor, glute med, piriformis                     PT Long Term Goals - 03/02/17 1214      PT LONG TERM GOAL #1   Title I with advanced HEP ( 04/13/17)    Time 6   Period Weeks   Status New     PT LONG TERM GOAL #2   Title maintain FOTO within 5 points of intial ( 55% limited), CK level ( 04/13/17)    Time 6   Period Weeks   Status New     PT LONG TERM GOAL #3   Title ambulate with upright posture without cuing using least restrictive assistive device ( 04/13/17)    Time 6   Period  Weeks   Status New     PT LONG TERM GOAL #4   Title improve strength Lt hip =/> 4+/5 to assist with her gardening ( 04/13/17)    Time 6   Period Weeks   Status New     PT LONG TERM GOAL #5   Title report =/> 50% reduction of Lt LE pain with walking ( 04/13/17)    Time 6   Period Weeks   Status New               Plan - 03/09/17 1518    Clinical Impression Statement Paulla Dolly continues with Lt hip pain.  She had difficulty with supine piriformis stretch, but tolerated all other exercises without any increase in pain.  Pt reported significant improvement in pain at end of session with MHP and estim.  Progressing towards established goals.    Rehab Potential Good   PT Frequency 2x / week   PT Duration 6 weeks   PT Treatment/Interventions Moist Heat;Ultrasound;Therapeutic exercise;Dry needling;Taping;Vasopneumatic Device;Manual techniques;Neuromuscular re-education;DME Instruction;Cryotherapy;Electrical Stimulation;Iontophoresis 4mg /ml Dexamethasone;Patient/family education   PT Next Visit Plan hip stretches, hip/core strengthening and manual work to [employer] and Agree with Plan of Care Patient      Patient will benefit from skilled therapeutic intervention in order to improve the following deficits and impairments:  Abnormal gait, Decreased range of motion, Difficulty walking, Increased muscle spasms, Pain, Improper body mechanics, Impaired flexibility, Decreased strength  Visit Diagnosis: No diagnosis found.     Problem List Patient Active Problem List   Diagnosis Date Noted  . CKD (chronic kidney disease) stage 4, GFR 15-29 ml/min (HCC) 02/25/2017  . Nasal polyposis 12/07/2016  . Cough 07/13/2016  . Tinea pedis 05/25/2016  . Diabetic foot ulcer ([place]) 05/25/2016  . Left hip pain 04/15/2016  . Lumbar radicular pain 01/15/2016  . Aortic atherosclerosis ([place]) 01/12/2016  . Anemia 02/16/2015  . Atrial fibrillation ([place]) 12/25/2014  . Diabetes mellitus  with kidney complication ([place]) 11/94/1740  . Spondylisthesis 05/31/2014  . CAD (coronary artery disease) 05/01/2014  . Renal artery stenosis ([place]) 05/01/2014  . Generalized anxiety disorder 04/03/2014  . CKD (chronic kidney disease) stage 3, GFR 30-59 ml/min 03/26/2014  . Chronic atrial flutter ([place]) 03/19/2014  .  Carotid artery stenosis 03/19/2014  . Hypercalcemia 02/27/2014  . Essential hypertension, benign 02/26/2014  . Hypothyroid 02/26/2014  . Hyperlipidemia 02/26/2014  . COPD (chronic obstructive pulmonary disease) ([place]) 02/26/2014  . Dementia 02/26/2014  . Presence of stent in coronary artery in patient with coronary artery disease 02/26/2014  . Nephrolithiasis 02/26/2014  . Diabetic peripheral neuropathy ([place]) 02/26/2014  . DVT, lower extremity, recurrent ([place]) 02/26/2014  . Status post left hip replacement 02/26/2014  . Overactive bladder 02/26/2014   Kerin Perna, PTA 03/09/17 3:33 PM  [place], [place], [address] Phone: [phone]   Fax:  [phone]  Name: Laquashia Mergenthaler MRN: [id number] Date of Birth: 1935-09-28

## 2017-03-11 ENCOUNTER — Ambulatory Visit (INDEPENDENT_AMBULATORY_CARE_PROVIDER_SITE_OTHER): Payer: Medicare Other | Admitting: Physical Therapy

## 2017-03-11 DIAGNOSIS — R293 Abnormal posture: Secondary | ICD-10-CM | POA: Diagnosis not present

## 2017-03-11 DIAGNOSIS — M25552 Pain in left hip: Secondary | ICD-10-CM | POA: Diagnosis present

## 2017-03-11 DIAGNOSIS — M6281 Muscle weakness (generalized): Secondary | ICD-10-CM

## 2017-03-11 DIAGNOSIS — M25652 Stiffness of left hip, not elsewhere classified: Secondary | ICD-10-CM | POA: Diagnosis not present

## 2017-03-11 NOTE — Therapy (Addendum)
[place], [place], [address] Phone: [phone]   Fax:  [phone]  Physical Therapy Treatment  Patient Details  Name: Briana Werner MRN: [id number] Date of Birth: November 06, 1935 Referring Provider: Dr. Georgina Snell   Encounter Date: 03/11/2017      PT End of Session - 03/11/17 1148    Visit Number 3   Number of Visits 12   Date for PT Re-Evaluation 04/13/17   PT Start Time PT stop time 1148 1228      Past Medical History:  Diagnosis Date  . Asthma   . Atrial flutter ([place])   . CAD (coronary artery disease)   . Cerebrovascular disease   . Chronic renal insufficiency 02/26/2014  . COPD (chronic obstructive pulmonary disease) ([place]) 02/26/2014  . Hypothyroid 02/26/2014  . Presence of stent in coronary artery in patient with coronary artery disease 02/26/2014  . Renal artery stenosis ([place])   . Type 2 diabetes mellitus ([place]) 02/26/2014    Past Surgical History:  Procedure Laterality Date  . ABDOMINAL HYSTERECTOMY    . APPENDECTOMY    . BACK SURGERY    . CHOLECYSTECTOMY    . CORONARY ARTERY BYPASS GRAFT     2004, [place]  . Hip replacement    . KNEE SURGERY Bilateral   . NASAL SINUS SURGERY    . SHOULDER SURGERY    . TONSILLECTOMY      There were no vitals filed for this visit.      Subjective Assessment - 03/11/17 1148    Subjective Pt reports continued soreness in LE from walking around and doing exercises.    Today her knees are "talking" to her.    Pertinent History Lt TKA /tibia replacement 3 times, last one was 2015.  Lt hip THA 2014, back surgery long time ago, DM, kidney issues Rt side not draining.    Patient Stated Goals walk more gently with less difficulty, feel more comfortable not with walker for standing and short distances in her apartment.    Currently in Pain? Yes   Pain Score 5    Pain Location Knee   Pain Orientation Right;Left   Aggravating Factors  standing, walking   Pain Relieving Factors medication, sitting with feet elevated.             [facility] PT Assessment - 03/11/17 0001      Assessment   Medical Diagnosis 04/04/17   Referring Provider Dr. Georgina Snell    Onset Date/Surgical Date 03/03/15   Hand Dominance Right   Next MD Visit 04/04/17   Prior Therapy not for this                     [facility] Adult PT Treatment/Exercise - 03/11/17 0001      Knee/Hip Exercises: Stretches   Passive Hamstring Stretch Left;30 seconds;Right;3 reps  seated, straight back, leg extended.    Hip Flexor Stretch Left;Right;2 reps;30 seconds  standing, Leg extended back.      Knee/Hip Exercises: Aerobic   Nustep L3: 6 min      Knee/Hip Exercises: Standing   Other Standing Knee Exercises side stepping Lt/Rt with increased step height and light UE support on elevated table x 5 ft x 6 reps      Knee/Hip Exercises: Seated   Sit to Sand 1 set;10 reps;with UE support     Modalities   Modalities --  pt declined.      Manual Therapy   Manual  Therapy Soft tissue mobilization   Soft tissue mobilization light STM to Lt quad with pt in sitting. Pt very tender in Lt distal quad and pes anserine.                      PT Long Term Goals - 03/11/17 1156      PT LONG TERM GOAL #1   Title I with advanced HEP ( 04/13/17)    Time 6   Period Weeks   Status On-going     PT LONG TERM GOAL #2   Title maintain FOTO within 5 points of intial ( 55% limited), CK level ( 04/13/17)    Time 6   Period Weeks   Status On-going     PT LONG TERM GOAL #3   Title ambulate with upright posture without cuing using least restrictive assistive device ( 04/13/17)    Time 6   Period Weeks   Status On-going     PT LONG TERM GOAL #4   Title improve strength Lt hip =/> 4+/5 to assist with her gardening ( 04/13/17)    Time 6   Period Weeks   Status On-going     PT LONG TERM GOAL #5   Title report =/> 50% reduction of Lt LE pain with walking ( 04/13/17)    Time 6   Period  Weeks   Status On-going               Plan - 03/11/17 1310    Clinical Impression Statement Pt tolerated standing exercises with minimal increase in pain in LE.  She reported reduction in bilat knee pain after seated hamstring stretches.  Progressing towards established goals.    Rehab Potential Good   PT Frequency 2x / week   PT Duration 6 weeks   PT Treatment/Interventions Moist Heat;Ultrasound;Therapeutic exercise;Dry needling;Taping;Vasopneumatic Device;Manual techniques;Neuromuscular re-education;DME Instruction;Cryotherapy;Electrical Stimulation;Iontophoresis 4mg /ml Dexamethasone;Patient/family education   PT Next Visit Plan hip stretches, hip/core strengthening and manual work to [employer] and Agree with Plan of Care Patient      Patient will benefit from skilled therapeutic intervention in order to improve the following deficits and impairments:  Abnormal gait, Decreased range of motion, Difficulty walking, Increased muscle spasms, Pain, Improper body mechanics, Impaired flexibility, Decreased strength  Visit Diagnosis: Pain in left hip  Muscle weakness (generalized)  Abnormal posture  Stiffness of left hip, not elsewhere classified     Problem List Patient Active Problem List   Diagnosis Date Noted  . CKD (chronic kidney disease) stage 4, GFR 15-29 ml/min (HCC) 02/25/2017  . Nasal polyposis 12/07/2016  . Cough 07/13/2016  . Tinea pedis 05/25/2016  . Diabetic foot ulcer ([place]) 05/25/2016  . Left hip pain 04/15/2016  . Lumbar radicular pain 01/15/2016  . Aortic atherosclerosis ([place]) 01/12/2016  . Anemia 02/16/2015  . Atrial fibrillation ([place]) 12/25/2014  . Diabetes mellitus with kidney complication ([place]) 01/75/1025  . Spondylisthesis 05/31/2014  . CAD (coronary artery disease) 05/01/2014  . Renal artery stenosis ([place]) 05/01/2014  . Generalized anxiety disorder 04/03/2014  . CKD (chronic kidney disease) stage 3, GFR 30-59 ml/min  03/26/2014  . Chronic atrial flutter ([place]) 03/19/2014  . Carotid artery stenosis 03/19/2014  . Hypercalcemia 02/27/2014  . Essential hypertension, benign 02/26/2014  . Hypothyroid 02/26/2014  . Hyperlipidemia 02/26/2014  . COPD (chronic obstructive pulmonary disease) ([place]) 02/26/2014  . Dementia 02/26/2014  . Presence of stent in coronary artery in patient with coronary artery disease 02/26/2014  .  Nephrolithiasis 02/26/2014  . Diabetic peripheral neuropathy ([place]) 02/26/2014  . DVT, lower extremity, recurrent ([place]) 02/26/2014  . Status post left hip replacement 02/26/2014  . Overactive bladder 02/26/2014    Shelbie Hutching 03/11/2017, 1:15 PM  [facility] [place], [place], [address] Phone: [phone]   Fax:  [phone]  Name: Briana Werner MRN: [id number] Date of Birth: 01/17/1936

## 2017-03-14 ENCOUNTER — Ambulatory Visit (INDEPENDENT_AMBULATORY_CARE_PROVIDER_SITE_OTHER): Payer: Medicare Other | Admitting: Physical Therapy

## 2017-03-14 DIAGNOSIS — M25552 Pain in left hip: Secondary | ICD-10-CM

## 2017-03-14 DIAGNOSIS — M62838 Other muscle spasm: Secondary | ICD-10-CM | POA: Diagnosis not present

## 2017-03-14 DIAGNOSIS — M6281 Muscle weakness (generalized): Secondary | ICD-10-CM

## 2017-03-14 DIAGNOSIS — M25652 Stiffness of left hip, not elsewhere classified: Secondary | ICD-10-CM

## 2017-03-14 DIAGNOSIS — R293 Abnormal posture: Secondary | ICD-10-CM | POA: Diagnosis not present

## 2017-03-14 NOTE — Therapy (Signed)
[facility] [place] [place], [place], [address] Phone: [phone]   Fax:  [phone]  Physical Therapy Treatment  Patient Details  Name: Briana Werner MRN: [id number] Date of Birth: October 16, 1935 Referring Provider: Dr. Georgina Snell   Encounter Date: 03/14/2017      PT End of Session - 03/14/17 1012    Visit Number 4   Number of Visits 12   Date for PT Re-Evaluation 04/13/17   PT Start Time 0933   PT Stop Time 1026   PT Time Calculation (min) 53 min   Activity Tolerance Patient tolerated treatment well      Past Medical History:  Diagnosis Date  . Asthma   . Atrial flutter ([place])   . CAD (coronary artery disease)   . Cerebrovascular disease   . Chronic renal insufficiency 02/26/2014  . COPD (chronic obstructive pulmonary disease) ([place]) 02/26/2014  . Hypothyroid 02/26/2014  . Presence of stent in coronary artery in patient with coronary artery disease 02/26/2014  . Renal artery stenosis ([place])   . Type 2 diabetes mellitus ([place]) 02/26/2014    Past Surgical History:  Procedure Laterality Date  . ABDOMINAL HYSTERECTOMY    . APPENDECTOMY    . BACK SURGERY    . CHOLECYSTECTOMY    . CORONARY ARTERY BYPASS GRAFT     2004, [place]  . Hip replacement    . KNEE SURGERY Bilateral   . NASAL SINUS SURGERY    . SHOULDER SURGERY    . TONSILLECTOMY      There were no vitals filed for this visit.      Subjective Assessment - 03/14/17 0939    Subjective Pt felt pain in left knee after last session, and used roller to left knee to help with pain. Pt has improved her posture at counter at home and is feeling less pain in her back while doing ADL's. Pt can only lift left knee about 3", then has to help to lift her left knee.   Currently in Pain? Yes   Pain Score 4    Pain Location Knee   Pain Orientation Left   Pain Descriptors / Indicators Burning   Pain Radiating Towards Runs down front of leg (calf)   Aggravating Factors   Walking, standing   Pain Relieving Factors Using roller   Effect of Pain on Daily Activities --   Multiple Pain Sites Yes  Simultaneous filing. User may not have seen previous data.   Pain Score 1   Pain Location Knee   Pain Orientation Right            [facility] PT Assessment - 03/14/17 0001      Strength   Right/Left Hip Left   Right Hip Flexion --   Right Hip ABduction --   Left Hip Flexion 4/5   Left Hip ABduction 3+/5   Left Knee Flexion 3+/5   Left Knee Extension 4/5          [facility] Adult PT Treatment/Exercise - 03/14/17 0001      Knee/Hip Exercises: Aerobic   Nustep L3: 6 min      Knee/Hip Exercises: Standing   Hip Abduction Right;Left;10 reps;1 set  Toe tap with hands on tall table for balance support    Hip Extension Right;Left;1 set;10 reps  Toe tap with hands on tall table for balance support      Knee/Hip Exercises: Supine   Bridges 2 sets;10 reps  3-5 sec hold in extension   Other  Supine Knee/Hip Exercises 10 reps leg lengtheners and press Lt side, 5 sec hold.      Knee/Hip Exercises: Sidelying   Hip ABduction Left;2 sets;10 reps     Moist Heat Therapy   Number Minutes Moist Heat 15 Minutes   Moist Heat Location Lumbar Spine;Hip  Left thigh     Electrical Stimulation   Electrical Stimulation Location Lt ant thigh   Electrical Stimulation Action IFC   Electrical Stimulation Parameters to tolerance   Electrical Stimulation Goals Pain                     PT Long Term Goals - 03/11/17 1156      PT LONG TERM GOAL #1   Title I with advanced HEP ( 04/13/17)    Time 6   Period Weeks   Status On-going     PT LONG TERM GOAL #2   Title maintain FOTO within 5 points of intial ( 55% limited), CK level ( 04/13/17)    Time 6   Period Weeks   Status On-going     PT LONG TERM GOAL #3   Title ambulate with upright posture without cuing using least restrictive assistive device ( 04/13/17)    Time 6   Period Weeks   Status On-going     PT  LONG TERM GOAL #4   Title improve strength Lt hip =/> 4+/5 to assist with her gardening ( 04/13/17)    Time 6   Period Weeks   Status On-going     PT LONG TERM GOAL #5   Title report =/> 50% reduction of Lt LE pain with walking ( 04/13/17)    Time 6   Period Weeks   Status On-going               Plan - 03/14/17 1044    Clinical Impression Statement Patient tolerated treatment well, but is still limited by pain and with range of motion in left knee. Pt also felt discomfort in lower back afte standing exercises; pain reduced with use of MHP at end of session. Patient needs to improve strength and balance in LE and will continue to benefit from PT treatments.    PT Next Visit Plan Strengthen left hip/knee, improve balance      Patient will benefit from skilled therapeutic intervention in order to improve the following deficits and impairments:     Visit Diagnosis: Pain in left hip  Muscle weakness (generalized)  Abnormal posture  Stiffness of left hip, not elsewhere classified  Other muscle spasm     Problem List Patient Active Problem List   Diagnosis Date Noted  . CKD (chronic kidney disease) stage 4, GFR 15-29 ml/min (HCC) 02/25/2017  . Nasal polyposis 12/07/2016  . Cough 07/13/2016  . Tinea pedis 05/25/2016  . Diabetic foot ulcer ([place]) 05/25/2016  . Left hip pain 04/15/2016  . Lumbar radicular pain 01/15/2016  . Aortic atherosclerosis ([place]) 01/12/2016  . Anemia 02/16/2015  . Atrial fibrillation ([place]) 12/25/2014  . Diabetes mellitus with kidney complication ([place]) 08/65/7846  . Spondylisthesis 05/31/2014  . CAD (coronary artery disease) 05/01/2014  . Renal artery stenosis ([place]) 05/01/2014  . Generalized anxiety disorder 04/03/2014  . CKD (chronic kidney disease) stage 3, GFR 30-59 ml/min 03/26/2014  . Chronic atrial flutter ([place]) 03/19/2014  . Carotid artery stenosis 03/19/2014  . Hypercalcemia 02/27/2014  . Essential hypertension, benign 02/26/2014  .  Hypothyroid 02/26/2014  . Hyperlipidemia 02/26/2014  . COPD (chronic obstructive pulmonary  disease) ([place]) 02/26/2014  . Dementia 02/26/2014  . Presence of stent in coronary artery in patient with coronary artery disease 02/26/2014  . Nephrolithiasis 02/26/2014  . Diabetic peripheral neuropathy ([place]) 02/26/2014  . DVT, lower extremity, recurrent ([place]) 02/26/2014  . Status post left hip replacement 02/26/2014  . Overactive bladder 02/26/2014    Andria Meuse, SPTA 03/14/2017, 1:08 PM  [facility] [place], [place], [address] Phone: [phone]   Fax:  [phone]  Name: Tamilyn Lupien MRN: [id number] Date of Birth: 1935-12-11  During this treatment session, the therapist was present, participating in and directing the treatment. Kerin Perna, PTA 03/14/17 1:10 PM

## 2017-03-18 ENCOUNTER — Encounter: Payer: Self-pay | Admitting: Physical Therapy

## 2017-03-18 ENCOUNTER — Ambulatory Visit (INDEPENDENT_AMBULATORY_CARE_PROVIDER_SITE_OTHER): Payer: Medicare Other | Admitting: Physical Therapy

## 2017-03-18 DIAGNOSIS — M25552 Pain in left hip: Secondary | ICD-10-CM | POA: Diagnosis present

## 2017-03-18 DIAGNOSIS — R293 Abnormal posture: Secondary | ICD-10-CM | POA: Diagnosis not present

## 2017-03-18 DIAGNOSIS — M62838 Other muscle spasm: Secondary | ICD-10-CM

## 2017-03-18 DIAGNOSIS — M25652 Stiffness of left hip, not elsewhere classified: Secondary | ICD-10-CM | POA: Diagnosis not present

## 2017-03-18 DIAGNOSIS — M6281 Muscle weakness (generalized): Secondary | ICD-10-CM | POA: Diagnosis not present

## 2017-03-18 NOTE — Patient Instructions (Signed)

## 2017-03-18 NOTE — Therapy (Signed)
[facility] [place], [place], [address] Phone: [phone]   Fax:  [phone]  Physical Therapy Treatment  Patient Details  Name: Briana Werner MRN: [id number] Date of Birth: Aug 10, 1936 Referring Provider: Dr. Georgina Snell   Encounter Date: 03/18/2017      PT End of Session - 03/18/17 1018    Visit Number 5   Number of Visits 12   Date for PT Re-Evaluation 04/13/17   PT Start Time 1018   PT Stop Time 1119   PT Time Calculation (min) 61 min   Activity Tolerance Patient tolerated treatment well      Past Medical History:  Diagnosis Date  . Asthma   . Atrial flutter ([place])   . CAD (coronary artery disease)   . Cerebrovascular disease   . Chronic renal insufficiency 02/26/2014  . COPD (chronic obstructive pulmonary disease) ([place]) 02/26/2014  . Hypothyroid 02/26/2014  . Presence of stent in coronary artery in patient with coronary artery disease 02/26/2014  . Renal artery stenosis ([place])   . Type 2 diabetes mellitus ([place]) 02/26/2014    Past Surgical History:  Procedure Laterality Date  . ABDOMINAL HYSTERECTOMY    . APPENDECTOMY    . BACK SURGERY    . CHOLECYSTECTOMY    . CORONARY ARTERY BYPASS GRAFT     2004, [place]  . Hip replacement    . KNEE SURGERY Bilateral   . NASAL SINUS SURGERY    . SHOULDER SURGERY    . TONSILLECTOMY      There were no vitals filed for this visit.      Subjective Assessment - 03/18/17 1024    Subjective Pt reports she fell this morning in the garage while putting some trash up, she landed on the Lt side and hit her head.  She is feeling ok in her head however having increased pain in the left hip.  She doesn't feel like she needs to see a doctor.    Currently in Pain? Yes   Pain Score 9    Pain Location Hip   Pain Orientation Left   Pain Descriptors / Indicators Burning;Aching   Pain Type Chronic pain   Pain Onset More than a month ago   Pain Frequency Constant   Aggravating Factors  her fall   Pain Relieving Factors gentle moving                         [facility] Adult PT Treatment/Exercise - 03/18/17 0001      Knee/Hip Exercises: Standing   Other Standing Knee Exercises walking around gym VC to stand upright      Knee/Hip Exercises: Seated   Long Arc Quad Left;15 reps     Modalities   Modalities Electrical Stimulation;Moist Heat     Moist Heat Therapy   Number Minutes Moist Heat 20 Minutes   Moist Heat Location --  Lt thigh hamstrings and auds     Electrical Stimulation   Electrical Stimulation Location Lt quad, adductors and medial HS   Electrical Stimulation Action IFC   Electrical Stimulation Parameters  to tolerance   Electrical Stimulation Goals Pain;Tone     Manual Therapy   Manual Therapy Soft tissue mobilization;Joint mobilization   Joint Mobilization Lt patellar mobs in all directions,   Soft tissue mobilization Lt thigh STM and TPR           Trigger Point Dry Needling - 03/18/17 1034    Consent Given?  Yes   Education Handout Provided Yes   Muscles Treated Lower Body Hamstring;Adductor longus/brevius/maximus;Quadriceps  all Left side with stim   Quadriceps Response Palpable increased muscle length;Twitch response elicited  VMO   Adductor Response Palpable increased muscle length;Twitch response elicited   Hamstring Response Palpable increased muscle length;Twitch response elicited  medially                   PT Long Term Goals - 03/11/17 1156      PT LONG TERM GOAL #1   Title I with advanced HEP ( 04/13/17)    Time 6   Period Weeks   Status On-going     PT LONG TERM GOAL #2   Title maintain FOTO within 5 points of intial ( 55% limited), CK level ( 04/13/17)    Time 6   Period Weeks   Status On-going     PT LONG TERM GOAL #3   Title ambulate with upright posture without cuing using least restrictive assistive device ( 04/13/17)    Time 6   Period Weeks   Status On-going     PT  LONG TERM GOAL #4   Title improve strength Lt hip =/> 4+/5 to assist with her gardening ( 04/13/17)    Time 6   Period Weeks   Status On-going     PT LONG TERM GOAL #5   Title report =/> 50% reduction of Lt LE pain with walking ( 04/13/17)    Time 6   Period Weeks   Status On-going               Plan - 03/18/17 1101    Clinical Impression Statement Rosy presented with a lot of pain after falling this morning.  She responded well to PT today and had reduction in pain at end of session.  She was able to lift her Lt LE onto the table easier as well.  Her patella is hypomobile and would benefit from more mobiizations.     Rehab Potential Good   PT Frequency 2x / week   PT Duration 6 weeks   PT Treatment/Interventions Moist Heat;Ultrasound;Therapeutic exercise;Dry needling;Taping;Vasopneumatic Device;Manual techniques;Neuromuscular re-education;DME Instruction;Cryotherapy;Electrical Stimulation;Iontophoresis 4mg /ml Dexamethasone;Patient/family education   PT Next Visit Plan patellar mobs      Patient will benefit from skilled therapeutic intervention in order to improve the following deficits and impairments:  Abnormal gait, Decreased range of motion, Difficulty walking, Increased muscle spasms, Pain, Improper body mechanics, Impaired flexibility, Decreased strength  Visit Diagnosis: Pain in left hip  Muscle weakness (generalized)  Abnormal posture  Stiffness of left hip, not elsewhere classified  Other muscle spasm     Problem List Patient Active Problem List   Diagnosis Date Noted  . CKD (chronic kidney disease) stage 4, GFR 15-29 ml/min (HCC) 02/25/2017  . Nasal polyposis 12/07/2016  . Cough 07/13/2016  . Tinea pedis 05/25/2016  . Diabetic foot ulcer ([place]) 05/25/2016  . Left hip pain 04/15/2016  . Lumbar radicular pain 01/15/2016  . Aortic atherosclerosis ([place]) 01/12/2016  . Anemia 02/16/2015  . Atrial fibrillation ([place]) 12/25/2014  . Diabetes mellitus with  kidney complication ([place]) 83/15/1761  . Spondylisthesis 05/31/2014  . CAD (coronary artery disease) 05/01/2014  . Renal artery stenosis ([place]) 05/01/2014  . Generalized anxiety disorder 04/03/2014  . CKD (chronic kidney disease) stage 3, GFR 30-59 ml/min 03/26/2014  . Chronic atrial flutter ([place]) 03/19/2014  . Carotid artery stenosis 03/19/2014  . Hypercalcemia 02/27/2014  . Essential hypertension, benign 02/26/2014  .  Hypothyroid 02/26/2014  . Hyperlipidemia 02/26/2014  . COPD (chronic obstructive pulmonary disease) ([place]) 02/26/2014  . Dementia 02/26/2014  . Presence of stent in coronary artery in patient with coronary artery disease 02/26/2014  . Nephrolithiasis 02/26/2014  . Diabetic peripheral neuropathy ([place]) 02/26/2014  . DVT, lower extremity, recurrent ([place]) 02/26/2014  . Status post left hip replacement 02/26/2014  . Overactive bladder 02/26/2014    Jeral Pinch PT  03/18/2017, 11:48 AM  [facility] [place], [place], [address] Phone: [phone]   Fax:  [phone]  Name: Briana Werner MRN: [id number] Date of Birth: 09/04/1936

## 2017-03-20 ENCOUNTER — Other Ambulatory Visit: Payer: Self-pay | Admitting: Osteopathic Medicine

## 2017-03-21 ENCOUNTER — Ambulatory Visit (INDEPENDENT_AMBULATORY_CARE_PROVIDER_SITE_OTHER): Payer: Medicare Other | Admitting: Physical Therapy

## 2017-03-21 DIAGNOSIS — M6281 Muscle weakness (generalized): Secondary | ICD-10-CM | POA: Diagnosis not present

## 2017-03-21 DIAGNOSIS — M25652 Stiffness of left hip, not elsewhere classified: Secondary | ICD-10-CM | POA: Diagnosis not present

## 2017-03-21 DIAGNOSIS — M25552 Pain in left hip: Secondary | ICD-10-CM | POA: Diagnosis present

## 2017-03-21 NOTE — Therapy (Addendum)
[place], [place], [address] Phone: [phone]   Fax:  [phone]  Physical Therapy Treatment  Patient Details  Name: Briana Werner MRN: [id number] Date of Birth: 12/21/1935 Referring Provider: Dr. Georgina Snell   Encounter Date: 03/21/2017      PT End of Session - 03/21/17 0941    Visit Number 6   Number of Visits 12   Date for PT Re-Evaluation 04/13/17   PT Start Time 0933   PT Stop Time 1040   PT Time Calculation (min) 67 min      Past Medical History:  Diagnosis Date  . Asthma   . Atrial flutter ([place])   . CAD (coronary artery disease)   . Cerebrovascular disease   . Chronic renal insufficiency 02/26/2014  . COPD (chronic obstructive pulmonary disease) ([place]) 02/26/2014  . Hypothyroid 02/26/2014  . Presence of stent in coronary artery in patient with coronary artery disease 02/26/2014  . Renal artery stenosis ([place])   . Type 2 diabetes mellitus ([place]) 02/26/2014    Past Surgical History:  Procedure Laterality Date  . ABDOMINAL HYSTERECTOMY    . APPENDECTOMY    . BACK SURGERY    . CHOLECYSTECTOMY    . CORONARY ARTERY BYPASS GRAFT     2004, [place]  . Hip replacement    . KNEE SURGERY Bilateral   . NASAL SINUS SURGERY    . SHOULDER SURGERY    . TONSILLECTOMY      There were no vitals filed for this visit.      Subjective Assessment - 03/21/17 0942    Subjective Pt is still in pain from her fall on Friday (7/6). She states that she continued to move around over the weekend, or she would get stiff. Pt stated that DN from last treatment was helpful and reduced her pain, but only for a couple hours.   Pertinent History Lt TKA /tibia replacement 3 times, last one was 2015.  Lt hip THA 2014, back surgery long time ago, DM, kidney issues Rt side not draining.    How long can you sit comfortably? no limitations   How long can you stand comfortably? immediate pain   How long can you walk  comfortably? immediate pain   Patient Stated Goals walk more gently with less difficulty, feel more comfortable not with walker for standing and short distances in her apartment.    Currently in Pain? Yes   Pain Score 5    Pain Location Hip   Pain Orientation Left   Pain Descriptors / Indicators Burning;Constant   Pain Radiating Towards Runs down front of leg   Pain Onset More than a month ago   Aggravating Factors  constant all the time   Multiple Pain Sites No                         [facility] Adult PT Treatment/Exercise - 03/21/17 0001      Knee/Hip Exercises: Standing   Hip Abduction Right;Left;10 reps;1 set  Hands on tall table for balance support    Hip Extension Right;Left;10 reps;2 sets  Toe tap with hands on tall table for balance support    Other Standing Knee Exercises side stepping Lt/Rt with increased step height and light UE support on elevated table x 5 ft x 7 reps      Knee/Hip Exercises: Seated   Long Arc Quad Left;10 reps  With green band, 5 second hold  Clamshell with TheraBand Green  Both x10, 1 set     Acupuncturist Location Lt knee, Lt adductor, Lt lateral HS   Electrical Stimulation Action IFC   Electrical Stimulation Parameters to tolerance   Electrical Stimulation Goals Pain     Manual Therapy   Manual Therapy Myofascial release   Myofascial Release MFR to Lt quad                     PT Long Term Goals - 03/11/17 1156      PT LONG TERM GOAL #1   Title I with advanced HEP ( 04/13/17)    Time 6   Period Weeks   Status On-going     PT LONG TERM GOAL #2   Title maintain FOTO within 5 points of intial ( 55% limited), CK level ( 04/13/17)    Time 6   Period Weeks   Status On-going     PT LONG TERM GOAL #3   Title ambulate with upright posture without cuing using least restrictive assistive device ( 04/13/17)    Time 6   Period Weeks   Status On-going     PT LONG TERM GOAL #4   Title  improve strength Lt hip =/> 4+/5 to assist with her gardening ( 04/13/17)    Time 6   Period Weeks   Status On-going     PT LONG TERM GOAL #5   Title report =/> 50% reduction of Lt LE pain with walking ( 04/13/17)    Time 6   Period Weeks   Status On-going               Plan - 03/21/17 1039    Clinical Impression Statement Paulla Dolly is still in pain from her fall from 3 days ago (7/6). She mentioned that her left hip is sore to the touch; however, she was not aware that her hip is badly bruised. Pt also reports that DN from last session only decreased her pain for an hour or two. Estim from this session relieved pain during treatment, but pain was back immediately following treatment. Pt did not progress toward goals due to recent fall.    Rehab Potential Good   PT Frequency 2x / week   PT Duration 6 weeks   PT Treatment/Interventions Moist Heat;Ultrasound;Therapeutic exercise;Dry needling;Taping;Vasopneumatic Device;Manual techniques;Neuromuscular re-education;DME Instruction;Cryotherapy;Electrical Stimulation;Iontophoresis 4mg /ml Dexamethasone;Patient/family education   PT Next Visit Plan Strength exercises; modalities as indicated   Consulted and Agree with Plan of Care Patient      Patient will benefit from skilled therapeutic intervention in order to improve the following deficits and impairments:  Abnormal gait, Decreased range of motion, Difficulty walking, Increased muscle spasms, Pain, Improper body mechanics, Impaired flexibility, Decreased strength  Visit Diagnosis: Pain in left hip  Muscle weakness (generalized)  Stiffness of left hip, not elsewhere classified     Problem List Patient Active Problem List   Diagnosis Date Noted  . CKD (chronic kidney disease) stage 4, GFR 15-29 ml/min (HCC) 02/25/2017  . Nasal polyposis 12/07/2016  . Cough 07/13/2016  . Tinea pedis 05/25/2016  . Diabetic foot ulcer ([place]) 05/25/2016  . Left hip pain 04/15/2016  . Lumbar radicular  pain 01/15/2016  . Aortic atherosclerosis ([place]) 01/12/2016  . Anemia 02/16/2015  . Atrial fibrillation ([place]) 12/25/2014  . Diabetes mellitus with kidney complication ([employer] [place]) 40/98/1191  . Spondylisthesis 05/31/2014  . CAD (coronary artery disease) 05/01/2014  . Renal artery stenosis ([place]) 05/01/2014  .  Generalized anxiety disorder 04/03/2014  . CKD (chronic kidney disease) stage 3, GFR 30-59 ml/min 03/26/2014  . Chronic atrial flutter ([place]) 03/19/2014  . Carotid artery stenosis 03/19/2014  . Hypercalcemia 02/27/2014  . Essential hypertension, benign 02/26/2014  . Hypothyroid 02/26/2014  . Hyperlipidemia 02/26/2014  . COPD (chronic obstructive pulmonary disease) ([place]) 02/26/2014  . Dementia 02/26/2014  . Presence of stent in coronary artery in patient with coronary artery disease 02/26/2014  . Nephrolithiasis 02/26/2014  . Diabetic peripheral neuropathy ([place]) 02/26/2014  . DVT, lower extremity, recurrent ([place]) 02/26/2014  . Status post left hip replacement 02/26/2014  . Overactive bladder 02/26/2014    Andria Meuse, SPTA 03/21/2017, 12:31 PM  Read, reviewed, edited and agree with student's findings and recommendations.  Kerin Perna, PTA 03/21/17 12:33 PM    [place], [place], [address] Phone: [phone]   Fax:  [phone]  Name: Gunda Maqueda MRN: [id number] Date of Birth: Aug 13, 1936

## 2017-03-23 ENCOUNTER — Other Ambulatory Visit: Payer: Self-pay | Admitting: Osteopathic Medicine

## 2017-03-23 MED ORDER — CARVEDILOL 12.5 MG PO TABS: 12.5000 mg | ORAL_TABLET | Freq: Two times a day (BID) | ORAL | 2 refills | Status: DC

## 2017-03-24 ENCOUNTER — Encounter: Payer: Self-pay | Admitting: Physical Therapy

## 2017-03-24 ENCOUNTER — Ambulatory Visit (INDEPENDENT_AMBULATORY_CARE_PROVIDER_SITE_OTHER): Payer: Medicare Other | Admitting: Physical Therapy

## 2017-03-24 DIAGNOSIS — M25552 Pain in left hip: Secondary | ICD-10-CM | POA: Diagnosis present

## 2017-03-24 DIAGNOSIS — M6281 Muscle weakness (generalized): Secondary | ICD-10-CM | POA: Diagnosis not present

## 2017-03-24 DIAGNOSIS — R293 Abnormal posture: Secondary | ICD-10-CM | POA: Diagnosis not present

## 2017-03-24 DIAGNOSIS — M62838 Other muscle spasm: Secondary | ICD-10-CM

## 2017-03-24 DIAGNOSIS — M25652 Stiffness of left hip, not elsewhere classified: Secondary | ICD-10-CM | POA: Diagnosis not present

## 2017-03-24 NOTE — Therapy (Addendum)
[facility] [address] [place], [place], [address] Phone: [phone]   Fax:  [phone]  Physical Therapy Treatment  Patient Details  Name: Briana Werner MRN: [id number] Date of Birth: [birth year] Referring Provider: Dr. Georgina Snell   Encounter Date: 03/24/2017      PT End of Session - 03/24/17 0900    Visit Number 7   Number of Visits 12   Date for PT Re-Evaluation 04/13/17   PT Start Time 0856   PT Stop Time 0959   PT Time Calculation (min) 63 min   Activity Tolerance Patient tolerated treatment well      Past Medical History:  Diagnosis Date  . Asthma   . Atrial flutter ([place])   . CAD (coronary artery disease)   . Cerebrovascular disease   . Chronic renal insufficiency 02/26/2014  . COPD (chronic obstructive pulmonary disease) ([place]) 02/26/2014  . Hypothyroid 02/26/2014  . Presence of stent in coronary artery in patient with coronary artery disease 02/26/2014  . Renal artery stenosis ([place])   . Type 2 diabetes mellitus ([place]) 02/26/2014    Past Surgical History:  Procedure Laterality Date  . ABDOMINAL HYSTERECTOMY    . APPENDECTOMY    . BACK SURGERY    . CHOLECYSTECTOMY    . CORONARY ARTERY BYPASS GRAFT     2004, [place]  . Hip replacement    . KNEE SURGERY Bilateral   . NASAL SINUS SURGERY    . SHOULDER SURGERY    . TONSILLECTOMY      There were no vitals filed for this visit.      Subjective Assessment - 03/24/17 0900    Subjective Pt is concerned because she doesn't feel like she can walk without her walker at all right now. She doesn't think it is due to pain, more from fear of falling.    Patient Stated Goals walk more gently with less difficulty, feel more comfortable not with walker for standing and short distances in her apartment.    Currently in Pain? Yes   Pain Score 5    Pain Location Buttocks   Pain Orientation Left   Pain Descriptors / Indicators Burning   Pain Type Chronic pain   Pain  Onset More than a month ago   Pain Frequency Constant   Aggravating Factors  constant   Pain Relieving Factors gentle moving still                         [facility] Adult PT Treatment/Exercise - 03/24/17 0001      Knee/Hip Exercises: Aerobic   Nustep L4: 6 min      Knee/Hip Exercises: Standing   Other Standing Knee Exercises toe taps alternating on 2" step with UE support VC to stand tall.    Other Standing Knee Exercises Then standing with Rt foot on step and moving UEs, required SBA      Modalities   Modalities Electrical Stimulation;Moist Heat     Moist Heat Therapy   Number Minutes Moist Heat 15 Minutes   Moist Heat Location Lumbar Spine  Lt shin     Electrical Stimulation   Electrical Stimulation Location Lt buttocks, Lt lateral shin   Electrical Stimulation Action premod   Electrical Stimulation Parameters to tolerance   Electrical Stimulation Goals Tone;Pain     Manual Therapy   Manual Therapy Soft tissue mobilization   Soft tissue mobilization STM to Lt gluts, piriformis with TPR in lateral gluts,  STM to [employer].  decreased palpable tightness at end.           Trigger Point Dry Needling - 03/24/17 0910    Consent Given? Yes   Education Handout Provided No   Muscles Treated Lower Body Tibialis anterior;Gluteus maximus   Gluteus Maximus Response Palpable increased muscle length;Twitch response elicited  Lt with stim   Tibialis Anterior Response Palpable increased muscle length;Twitch response elicited  Lt with stim                   PT Long Term Goals - 03/11/17 1156      PT LONG TERM GOAL #1   Title I with advanced HEP ( 04/13/17)    Time 6   Period Weeks   Status On-going     PT LONG TERM GOAL #2   Title maintain FOTO within 5 points of intial ( 55% limited), CK level ( 04/13/17)    Time 6   Period Weeks   Status On-going     PT LONG TERM GOAL #3   Title ambulate with upright posture without cuing using least restrictive  assistive device ( 04/13/17)    Time 6   Period Weeks   Status On-going     PT LONG TERM GOAL #4   Title improve strength Lt hip =/> 4+/5 to assist with her gardening ( 04/13/17)    Time 6   Period Weeks   Status On-going     PT LONG TERM GOAL #5   Title report =/> 50% reduction of Lt LE pain with walking ( 04/13/17)    Time 6   Period Weeks   Status On-going               Plan - 03/24/17 0949    Clinical Impression Statement Briana Werner has become more fearful of falling since last week and feels like this is limiting some of her progress. Had a good twitch response to DN in her Lt gluts and reports some ease in movement afterwards. She would benift from standing ex to build strength and confidence in taking weight through the Lt LE and more DN/manual work .    Rehab Potential Good   PT Frequency 2x / week   PT Duration 6 weeks   PT Treatment/Interventions Moist Heat;Ultrasound;Therapeutic exercise;Dry needling;Taping;Vasopneumatic Device;Manual techniques;Neuromuscular re-education;DME Instruction;Cryotherapy;Electrical Stimulation;Iontophoresis 77m/ml Dexamethasone;Patient/family education   PT Next Visit Plan standing ther ex that encourages WBing into Lt LE   Consulted and Agree with Plan of Care Patient      Patient will benefit from skilled therapeutic intervention in order to improve the following deficits and impairments:  Abnormal gait, Decreased range of motion, Difficulty walking, Increased muscle spasms, Pain, Improper body mechanics, Impaired flexibility, Decreased strength  Visit Diagnosis: Pain in left hip  Muscle weakness (generalized)  Stiffness of left hip, not elsewhere classified  Abnormal posture  Other muscle spasm     Problem List Patient Active Problem List   Diagnosis Date Noted  . CKD (chronic kidney disease) stage 4, GFR 15-29 ml/min (HCC) 02/25/2017  . Nasal polyposis 12/07/2016  . Cough 07/13/2016  . Tinea pedis 05/25/2016  . Diabetic foot  ulcer ([place] 05/25/2016  . Left hip pain 04/15/2016  . Lumbar radicular pain 01/15/2016  . Aortic atherosclerosis ([place] 01/12/2016  . Anemia 02/16/2015  . Atrial fibrillation ([facility] 12/25/2014  . Diabetes mellitus with kidney complication ([facility] 196/28/3662 . Spondylisthesis 05/31/2014  . CAD (coronary artery disease) 05/01/2014  .  Renal artery stenosis ([place]) 05/01/2014  . Generalized anxiety disorder 04/03/2014  . CKD (chronic kidney disease) stage 3, GFR 30-59 ml/min 03/26/2014  . Chronic atrial flutter ([place]) 03/19/2014  . Carotid artery stenosis 03/19/2014  . Hypercalcemia 02/27/2014  . Essential hypertension, benign 02/26/2014  . Hypothyroid 02/26/2014  . Hyperlipidemia 02/26/2014  . COPD (chronic obstructive pulmonary disease) ([place]) 02/26/2014  . Dementia 02/26/2014  . Presence of stent in coronary artery in patient with coronary artery disease 02/26/2014  . Nephrolithiasis 02/26/2014  . Diabetic peripheral neuropathy ([place]) 02/26/2014  . DVT, lower extremity, recurrent ([place]) 02/26/2014  . Status post left hip replacement 02/26/2014  . Overactive bladder 02/26/2014    Jeral Pinch PT 03/24/2017, 9:54 AM  [facility], [place], [address] Phone: [phone]   Fax:  [phone]  Name: Briana Werner MRN: [id number] Date of Birth: [birth year]   PHYSICAL THERAPY DISCHARGE SUMMARY  Visits from Start of Care: 7 Current functional level related to goals / functional outcomes: unknown   Remaining deficits: unknown   Education / Equipment: HEP Plan:                                                    Patient goals were not met. Patient is being discharged due to not returning since the last visit.  ?????She was supposed to return for therapy however never did    Jeral Pinch, PT 05/17/17 12:13 PM

## 2017-03-28 ENCOUNTER — Encounter: Payer: Medicare Other | Admitting: Physical Therapy

## 2017-03-28 ENCOUNTER — Ambulatory Visit (INDEPENDENT_AMBULATORY_CARE_PROVIDER_SITE_OTHER): Payer: Medicare Other | Admitting: Osteopathic Medicine

## 2017-03-28 ENCOUNTER — Encounter: Payer: Self-pay | Admitting: Osteopathic Medicine

## 2017-03-28 VITALS — BP 134/62 | HR 82 | Wt 177.0 lb

## 2017-03-28 DIAGNOSIS — I2583 Coronary atherosclerosis due to lipid rich plaque: Secondary | ICD-10-CM | POA: Diagnosis not present

## 2017-03-28 DIAGNOSIS — I2510 Atherosclerotic heart disease of native coronary artery without angina pectoris: Secondary | ICD-10-CM | POA: Diagnosis not present

## 2017-03-28 DIAGNOSIS — L509 Urticaria, unspecified: Secondary | ICD-10-CM | POA: Diagnosis not present

## 2017-03-28 MED ORDER — PREDNISONE 10 MG (48) PO TBPK: ORAL_TABLET | Freq: Every day | ORAL | 0 refills | Status: DC

## 2017-03-28 MED ORDER — METHYLPREDNISOLONE SODIUM SUCC 125 MG IJ SOLR
125.0000 mg | Freq: Once | INTRAMUSCULAR | Status: AC
  Administered 2017-03-28: 125 mg via INTRAMUSCULAR

## 2017-03-28 NOTE — Patient Instructions (Signed)
I think you're having a reaction to something -often we are unable to determine what triggers something like this  Plan:  Steroid shot today  Oral steroid taper sent to pharmacy - start this in 1-2 days  Rash may take time to go away, but itching should get better  If no better, or if worse/change, come see [place]! We can biopsy to confirm diagnosis  If fever or unusual joint pain, come see [place]!

## 2017-03-28 NOTE — Progress Notes (Signed)
HPI: Briana Werner is a 81 y.o. female  who presents to [facility] today, 03/28/17,  for chief complaint of:  Chief Complaint  Patient presents with  . Rash  . Fall   Rash . Context: working in garden day prior to rash outbreak, no known exposures, no new medicines . Location: generalized . Quality: itching, mild swellig . Severity: severe! . Duration: 2 days  Fall: tripped when walker caught on carpet, no LOC, no HA/VC, no dizziness, no MSK pain other than where she caught herself with her hands   Past medical history, surgical history, social history and family history reviewed.  Patient Active Problem List   Diagnosis Date Noted  . CKD (chronic kidney disease) stage 4, GFR 15-29 ml/min (HCC) 02/25/2017  . Nasal polyposis 12/07/2016  . Cough 07/13/2016  . Tinea pedis 05/25/2016  . Diabetic foot ulcer ([place]) 05/25/2016  . Left hip pain 04/15/2016  . Lumbar radicular pain 01/15/2016  . Aortic atherosclerosis ([place]) 01/12/2016  . Anemia 02/16/2015  . Atrial fibrillation ([place]) 12/25/2014  . Diabetes mellitus with kidney complication ([place]) 66/02/3015  . Spondylisthesis 05/31/2014  . CAD (coronary artery disease) 05/01/2014  . Renal artery stenosis ([place]) 05/01/2014  . Generalized anxiety disorder 04/03/2014  . CKD (chronic kidney disease) stage 3, GFR 30-59 ml/min 03/26/2014  . Chronic atrial flutter ([place]) 03/19/2014  . Carotid artery stenosis 03/19/2014  . Hypercalcemia 02/27/2014  . Essential hypertension, benign 02/26/2014  . Hypothyroid 02/26/2014  . Hyperlipidemia 02/26/2014  . COPD (chronic obstructive pulmonary disease) ([place]) 02/26/2014  . Dementia 02/26/2014  . Presence of stent in coronary artery in patient with coronary artery disease 02/26/2014  . Nephrolithiasis 02/26/2014  . Diabetic peripheral neuropathy ([place]) 02/26/2014  . DVT, lower extremity, recurrent ([place]) 02/26/2014  . Status post left hip replacement 02/26/2014  .  Overactive bladder 02/26/2014    Current medication list and allergy/intolerance information reviewed.   Current Outpatient Prescriptions on File Prior to Visit  Medication Sig Dispense Refill  . AMBULATORY NON FORMULARY MEDICATION Insulin needles for lantus solastar  31g x4 100 each 11  . AMBULATORY NON FORMULARY MEDICATION Diabetic Test Strips and Lancets: One Touch Ultra Blue  Dx Type 2 Diabetes.e11.22  Use to check blood sugar twice a day. 100 each PRN  . atorvastatin (LIPITOR) 40 MG tablet TAKE 1 TABLET(40 MG) BY MOUTH DAILY AT 6 PM 90 tablet 0  . benzonatate (TESSALON) 200 MG capsule Take 1 capsule (200 mg total) by mouth 3 (three) times daily as needed for cough. 30 capsule 1  . Calcium Carbonate-Vitamin D (CALTRATE 600+D PO) Take by mouth 2 (two) times daily.    . carvedilol (COREG) 12.5 MG tablet Take 1 tablet (12.5 mg total) by mouth 2 (two) times daily with a meal. 60 tablet 2  . ELIQUIS 5 MG TABS tablet TAKE 1 TABLET(5 MG) BY MOUTH TWICE DAILY 60 tablet 0  . escitalopram (LEXAPRO) 5 MG tablet TAKE 1 TABLET BY MOUTH DAILY 90 tablet 0  . FeFum-FePoly-FA-B Cmp-C-Biot (INTEGRA PLUS) CAPS TAKE 1 CAPSULE BY MOUTH DAILY 90 capsule 0  . FeFum-FePoly-FA-B Cmp-C-Biot (INTEGRA PLUS) CAPS TAKE 1 CAPSULE BY MOUTH DAILY 90 capsule 0  . Fluticasone-Salmeterol (ADVAIR DISKUS) 500-50 MCG/DOSE AEPB Inhale 1 puff into the lungs 2 (two) times daily. 60 each 6  . gabapentin (NEURONTIN) 300 MG capsule TAKE 1 CAPSULE(300 MG) BY MOUTH THREE TIMES DAILY 90 capsule 0  . ipratropium-albuterol (DUONEB) 0.5-2.5 (3) MG/3ML SOLN Take 3 mLs by  nebulization every 2 (two) hours as needed (wheeze, SOB, cough). 60 mL 1  . IRON PO Take by mouth.    Marland Kitchen JANUVIA 50 MG tablet TAKE 1 TABLET BY MOUTH EVERY DAY 90 tablet 0  . levothyroxine (SYNTHROID, LEVOTHROID) 88 MCG tablet TAKE 1 TABLET BY MOUTH DAILY BEFORE BREAKFAST 90 tablet 0  . levothyroxine (SYNTHROID, LEVOTHROID) 88 MCG tablet TAKE 1 TABLET BY MOUTH DAILY BEFORE  BREAKFAST 90 tablet 0  . levothyroxine (SYNTHROID, LEVOTHROID) 88 MCG tablet TAKE 1 TABLET BY MOUTH DAILY BEFORE BREAKFAST 90 tablet 0  . montelukast (SINGULAIR) 10 MG tablet TAKE 1 TABLET BY MOUTH AT BEDTIME 30 tablet 0  . Multiple Vitamin (MULTIVITAMIN) capsule Take 1 capsule by mouth.    . ONE TOUCH ULTRA TEST test strip CHECK BLOOD GLUCOSE TWICE DAILY 100 each 1  . oxyCODONE-acetaminophen (ROXICET) 5-325 MG tablet Take 1 tablet by mouth 2 (two) times daily as needed for severe pain. 30 tablet 0  . pantoprazole (PROTONIX) 40 MG tablet TAKE 1 TABLET BY MOUTH DAILY 90 tablet 0  . pantoprazole (PROTONIX) 40 MG tablet TAKE 1 TABLET BY MOUTH DAILY 90 tablet 0  . PROVENTIL HFA 108 (90 Base) MCG/ACT inhaler INHALE 2 PUFFS BY MOUTH EVERY 4 TO 6 HOURS AS NEEDED FOR SHORTNESS OF BREATH OR WHEEZING 6.7 g 0  . TOUJEO SOLOSTAR 300 UNIT/ML SOPN INJECT 38 UNITS UNDER THE SKIN DAILY 4.5 mL 6  . umeclidinium bromide (INCRUSE ELLIPTA) 62.5 MCG/INH AEPB Inhale 1 puff into the lungs daily. 1 each 6  . VESICARE 5 MG tablet TAKE 1 TABLET BY MOUTH DAILY 30 tablet 0   No current facility-administered medications on file prior to visit.    Allergies  Allergen Reactions  . Tetracycline Shortness Of Breath  . Tetracyclines & Related Anaphylaxis and Shortness Of Breath    dizziness  . Other Dermatitis    Bandaids   . Amlodipine     Edema  . Amoxicillin     Questionable rash  . Bactrim [Sulfamethoxazole-Trimethoprim] Other (See Comments)    dizzy dizzy  . Latex Dermatitis    Bandages, pulls skin  . Tape Dermatitis    Bandaids  Bandaids       Review of Systems:  Constitutional: No recent illness, no fever or night sweats  HEENT: No  headache, no vision change  Cardiac: No  chest pain, No  pressure, No palpitations  Respiratory:  No  shortness of breath. No  Cough  Musculoskeletal: No new myalgia/arthralgia  Skin: +Rash  Neurologic: No  weakness, No  Dizziness   Exam:  BP 134/62   Pulse  82   Wt 177 lb (80.3 kg)   SpO2 95%   BMI 27.72 kg/m   Constitutional: VS see above. General Appearance: alert, well-developed, well-nourished, NAD  Neck: No masses, trachea midline.   Respiratory: Normal respiratory effort.   Musculoskeletal: Gait normal. Symmetric and independent movement of all extremities  Neurological: Normal balance/coordination. No tremor.  Skin: warm, dry, intact. Blanching maculopapular rash w/ mild edema - see photos   Psychiatric: Normal judgment/insight. Normal mood and affect. Oriented x3.            ASSESSMENT/PLAN:   Hives of unknown origin - Plan: predniSONE (STERAPRED UNI-PAK 48 TAB) 10 MG (48) TBPK tablet     No orders of the defined types were placed in this encounter.    Follow-up plan: Return for recheck rash in 1 week if no better, sooner if worse .  Visit  summary with medication list and pertinent instructions was printed for patient to review, alert [place] if any changes needed. All questions at time of visit were answered - patient instructed to contact office with any additional concerns. ER/RTC precautions were reviewed with the patient and understanding verbalized.

## 2017-03-28 NOTE — Addendum Note (Signed)
Addended by: Teddy Spike on: 03/28/2017 03:05 PM   Modules accepted: Orders

## 2017-03-31 ENCOUNTER — Encounter: Payer: Medicare Other | Admitting: Physical Therapy

## 2017-03-31 NOTE — Progress Notes (Signed)
HPI: FU coronary disease, cerebrovascular disease and renal artery stenosis. Previously resided in [place]. Patient underwent coronary artery bypassing graft in 2004. Records unavailable. She has had stents placed since then but none since 2006. Had cath at [facility] 7/15 but no records available. VQ scan September 2015 low probability. Renal Dopplers in September 2015 showed greater than 60% left renal artery stenosis. There was an anechoic density in the right kidney. Dedicated renal ultrasound revealed 2.5 cm cyst in the upper pole of the right kidney. Carotid Dopplers March 2016 at [facility] showed no stenosis. Echocardiogram January 2016 showed ejection fraction 50-55%, moderate left atrial enlargement and mild mitral regurgitation. Nuclear study April 2016 was normal. Since last seen, she recently lost her husband. She denies dyspnea, chest pain, palpitations or syncope. No bleeding.  Current Outpatient Prescriptions  Medication Sig Dispense Refill  . AMBULATORY NON FORMULARY MEDICATION Insulin needles for lantus solastar  31g x4 100 each 11  . AMBULATORY NON FORMULARY MEDICATION Diabetic Test Strips and Lancets: One Touch Ultra Blue  Dx Type 2 Diabetes.e11.22  Use to check blood sugar twice a day. 100 each PRN  . atorvastatin (LIPITOR) 40 MG tablet TAKE 1 TABLET(40 MG) BY MOUTH DAILY AT 6 PM 90 tablet 0  . Calcium Carbonate-Vitamin D (CALTRATE 600+D PO) Take by mouth 2 (two) times daily.    . carvedilol (COREG) 12.5 MG tablet Take 1 tablet (12.5 mg total) by mouth 2 (two) times daily with a meal. 60 tablet 2  . ELIQUIS 5 MG TABS tablet TAKE 1 TABLET(5 MG) BY MOUTH TWICE DAILY 60 tablet 0  . escitalopram (LEXAPRO) 5 MG tablet TAKE 1 TABLET BY MOUTH DAILY 90 tablet 0  . FeFum-FePoly-FA-B Cmp-C-Biot (INTEGRA PLUS) CAPS TAKE 1 CAPSULE BY MOUTH DAILY 90 capsule 0  . Fluticasone-Salmeterol (ADVAIR DISKUS) 500-50 MCG/DOSE AEPB Inhale 1 puff into the lungs 2 (two) times daily. 60 each 6  .  gabapentin (NEURONTIN) 300 MG capsule TAKE 1 CAPSULE(300 MG) BY MOUTH THREE TIMES DAILY 90 capsule 0  . ipratropium-albuterol (DUONEB) 0.5-2.5 (3) MG/3ML SOLN Take 3 mLs by nebulization every 2 (two) hours as needed (wheeze, SOB, cough). 60 mL 1  . IRON PO Take by mouth.    Marland Kitchen JANUVIA 50 MG tablet TAKE 1 TABLET BY MOUTH EVERY DAY 90 tablet 0  . levothyroxine (SYNTHROID, LEVOTHROID) 88 MCG tablet TAKE 1 TABLET BY MOUTH DAILY BEFORE BREAKFAST 90 tablet 0  . montelukast (SINGULAIR) 10 MG tablet TAKE 1 TABLET BY MOUTH AT BEDTIME 30 tablet 0  . Multiple Vitamin (MULTIVITAMIN) capsule Take 1 capsule by mouth.    . ONE TOUCH ULTRA TEST test strip CHECK BLOOD GLUCOSE TWICE DAILY 100 each 1  . oxyCODONE-acetaminophen (ROXICET) 5-325 MG tablet Take 1 tablet by mouth 2 (two) times daily as needed for severe pain. 30 tablet 0  . pantoprazole (PROTONIX) 40 MG tablet TAKE 1 TABLET BY MOUTH DAILY 90 tablet 0  . predniSONE (STERAPRED UNI-PAK 48 TAB) 10 MG (48) TBPK tablet Take by mouth daily. 12-Day taper, po 48 tablet 0  . PROVENTIL HFA 108 (90 Base) MCG/ACT inhaler INHALE 2 PUFFS BY MOUTH EVERY 4 TO 6 HOURS AS NEEDED FOR SHORTNESS OF BREATH OR WHEEZING 6.7 g 0  . TOUJEO SOLOSTAR 300 UNIT/ML SOPN INJECT 38 UNITS UNDER THE SKIN DAILY 4.5 mL 6  . umeclidinium bromide (INCRUSE ELLIPTA) 62.5 MCG/INH AEPB Inhale 1 puff into the lungs daily. 1 each 6  . VESICARE 5 MG tablet TAKE  1 TABLET BY MOUTH DAILY 30 tablet 0   No current facility-administered medications for this visit.      Past Medical History:  Diagnosis Date  . Asthma   . Atrial flutter ([place])   . CAD (coronary artery disease)   . Cerebrovascular disease   . Chronic renal insufficiency 02/26/2014  . COPD (chronic obstructive pulmonary disease) ([place]) 02/26/2014  . Hypothyroid 02/26/2014  . Presence of stent in coronary artery in patient with coronary artery disease 02/26/2014  . Renal artery stenosis ([place])   . Type 2 diabetes mellitus ([place]) 02/26/2014     Past Surgical History:  Procedure Laterality Date  . ABDOMINAL HYSTERECTOMY    . APPENDECTOMY    . BACK SURGERY    . CHOLECYSTECTOMY    . CORONARY ARTERY BYPASS GRAFT     2004, [place]  . Hip replacement    . KNEE SURGERY Bilateral   . NASAL SINUS SURGERY    . SHOULDER SURGERY    . TONSILLECTOMY      Social History   Social History  . Marital status: Married    Spouse name: N/A  . Number of children: 4  . Years of education: N/A   Occupational History  . Not on file.   Social History Main Topics  . Smoking status: Never Smoker  . Smokeless tobacco: Never Used  . Alcohol use No  . Drug use: No  . Sexual activity: No   Other Topics Concern  . Not on file   Social History Narrative  . No narrative on file    Family History  Problem Relation Age of Onset  . CAD Father   . Heart attack Father     ROS: no fevers or chills, productive cough, hemoptysis, dysphasia, odynophagia, melena, hematochezia, dysuria, hematuria, rash, seizure activity, orthopnea, PND, pedal edema, claudication. Remaining systems are negative.  Physical Exam: Well-developed well-nourished in no acute distress.  Skin is warm and dry.  HEENT is normal.  Neck is supple.  Chest is clear to auscultation with normal expansion.  Cardiovascular exam is irregular Abdominal exam nontender or distended. No masses palpated. Extremities show no edema. neuro grossly intact  ECG- atrial flutter versus ectopic atrial tachycardia with controlled ventricular response at 74. Left anterior fascicular block, right bundle branch block, septal infarct. personally reviewed  A/P  1 Coronary artery disease-continue statin. Patient is not on aspirin given need for anticoagulation for atrial fibrillation.  2 carotid artery disease-continue statin.  3 permanent atrial fibrillation/a flutter-continue carvedilol for rate control. Continue apixaban. May need to adjust dose in the near future when she turns 82  as her creatinine is greater than 1.5.  4 hyperlipidemia-continue statin.  5 hypertension-blood pressure is elevated. Continue present medications and follow and increase as needed.  6 renal artery stenosis-continue statin. Schedule follow-up Dopplers.  Kirk Ruths, MD

## 2017-04-04 ENCOUNTER — Ambulatory Visit: Payer: Medicare Other | Admitting: Osteopathic Medicine

## 2017-04-04 ENCOUNTER — Ambulatory Visit: Payer: Medicare Other | Admitting: Family Medicine

## 2017-04-12 ENCOUNTER — Other Ambulatory Visit: Payer: Self-pay | Admitting: Osteopathic Medicine

## 2017-04-12 DIAGNOSIS — B029 Zoster without complications: Secondary | ICD-10-CM

## 2017-04-13 ENCOUNTER — Ambulatory Visit (INDEPENDENT_AMBULATORY_CARE_PROVIDER_SITE_OTHER): Payer: Medicare Other | Admitting: Cardiology

## 2017-04-13 ENCOUNTER — Other Ambulatory Visit: Payer: Self-pay | Admitting: *Deleted

## 2017-04-13 ENCOUNTER — Encounter: Payer: Self-pay | Admitting: Cardiology

## 2017-04-13 VITALS — BP 156/70 | HR 74 | Ht 67.0 in | Wt 170.1 lb

## 2017-04-13 DIAGNOSIS — I2583 Coronary atherosclerosis due to lipid rich plaque: Secondary | ICD-10-CM

## 2017-04-13 DIAGNOSIS — I2510 Atherosclerotic heart disease of native coronary artery without angina pectoris: Secondary | ICD-10-CM | POA: Diagnosis not present

## 2017-04-13 DIAGNOSIS — I10 Essential (primary) hypertension: Secondary | ICD-10-CM | POA: Diagnosis not present

## 2017-04-13 DIAGNOSIS — E7800 Pure hypercholesterolemia, unspecified: Secondary | ICD-10-CM

## 2017-04-13 DIAGNOSIS — I4892 Unspecified atrial flutter: Secondary | ICD-10-CM | POA: Diagnosis not present

## 2017-04-13 NOTE — Patient Instructions (Signed)
Your physician wants you to follow-up in: 6 MONTHS WITH DR CRENSHAW You will receive a reminder letter in the mail two months in advance. If you don't receive a letter, please call our office to schedule the follow-up appointment.   If you need a refill on your cardiac medications before your next appointment, please call your pharmacy.  

## 2017-04-18 ENCOUNTER — Other Ambulatory Visit: Payer: Self-pay

## 2017-04-18 DIAGNOSIS — B0223 Postherpetic polyneuropathy: Secondary | ICD-10-CM

## 2017-04-18 MED ORDER — GABAPENTIN 300 MG PO CAPS: ORAL_CAPSULE | ORAL | 1 refills | Status: DC

## 2017-05-03 ENCOUNTER — Other Ambulatory Visit: Payer: Self-pay

## 2017-05-03 MED ORDER — INSULIN PEN NEEDLE 32G X 4 MM MISC: 99 refills | Status: DC

## 2017-05-10 ENCOUNTER — Ambulatory Visit (INDEPENDENT_AMBULATORY_CARE_PROVIDER_SITE_OTHER): Payer: Medicare Other | Admitting: Family Medicine

## 2017-05-10 ENCOUNTER — Ambulatory Visit (INDEPENDENT_AMBULATORY_CARE_PROVIDER_SITE_OTHER): Payer: Medicare Other | Admitting: Osteopathic Medicine

## 2017-05-10 ENCOUNTER — Ambulatory Visit: Payer: Medicare Other | Admitting: Family Medicine

## 2017-05-10 VITALS — BP 148/68 | HR 58 | Ht 67.0 in | Wt 170.0 lb

## 2017-05-10 VITALS — BP 152/60 | HR 71 | Temp 97.4°F | Ht 67.0 in | Wt 170.0 lb

## 2017-05-10 DIAGNOSIS — I2510 Atherosclerotic heart disease of native coronary artery without angina pectoris: Secondary | ICD-10-CM | POA: Diagnosis not present

## 2017-05-10 DIAGNOSIS — M5416 Radiculopathy, lumbar region: Secondary | ICD-10-CM | POA: Diagnosis not present

## 2017-05-10 DIAGNOSIS — I2583 Coronary atherosclerosis due to lipid rich plaque: Secondary | ICD-10-CM

## 2017-05-10 DIAGNOSIS — Z23 Encounter for immunization: Secondary | ICD-10-CM | POA: Diagnosis not present

## 2017-05-10 DIAGNOSIS — I10 Essential (primary) hypertension: Secondary | ICD-10-CM | POA: Diagnosis not present

## 2017-05-10 DIAGNOSIS — E119 Type 2 diabetes mellitus without complications: Secondary | ICD-10-CM

## 2017-05-10 DIAGNOSIS — I482 Chronic atrial fibrillation, unspecified: Secondary | ICD-10-CM

## 2017-05-10 DIAGNOSIS — M25552 Pain in left hip: Secondary | ICD-10-CM | POA: Diagnosis not present

## 2017-05-10 DIAGNOSIS — J439 Emphysema, unspecified: Secondary | ICD-10-CM

## 2017-05-10 DIAGNOSIS — M791 Myalgia, unspecified site: Secondary | ICD-10-CM

## 2017-05-10 LAB — CBC
HEMATOCRIT: 37.9 % (ref 35.0–45.0)
Hemoglobin: 12.4 g/dL (ref 11.7–15.5)
MCH: 28.9 pg (ref 27.0–33.0)
MCHC: 32.7 g/dL (ref 32.0–36.0)
MCV: 88.3 fL (ref 80.0–100.0)
MPV: 12.2 fL (ref 7.5–12.5)
PLATELETS: 256 10*3/uL (ref 140–400)
RBC: 4.29 MIL/uL (ref 3.80–5.10)
RDW: 14.9 % (ref 11.0–15.0)
WBC: 11.4 10*3/uL — AB (ref 3.8–10.8)

## 2017-05-10 LAB — POCT GLYCOSYLATED HEMOGLOBIN (HGB A1C): Hemoglobin A1C: 6.9

## 2017-05-10 MED ORDER — CARVEDILOL 12.5 MG PO TABS: 12.5000 mg | ORAL_TABLET | Freq: Two times a day (BID) | ORAL | 3 refills | Status: DC

## 2017-05-10 NOTE — Progress Notes (Signed)
Briana Werner is a 81 y.o. female who presents to [facility] today for leg pain. Patient has been seen several times for hip and leg pain thought to be trochanteric bursitis. She's done some physical therapy last about a month ago. Since then she's noted worsening leg weakness and thigh pain. She points to her bilateral anterior thighs as the majority of her pain. She has that she does have some pain that radiates down the lateral calves as well. She uses a walker to ambulate and notes that she's fallen several times. She has a history of lumbar degeneration with laminectomies and surgery. Her last MRI was a few years ago. She denies any bowel or bladder dysfunction.  She takes atorvastatin daily.   Past Medical History:  Diagnosis Date  . Asthma   . Atrial flutter ([place])   . CAD (coronary artery disease)   . Cerebrovascular disease   . Chronic renal insufficiency 02/26/2014  . COPD (chronic obstructive pulmonary disease) ([place]) 02/26/2014  . Hypothyroid 02/26/2014  . Presence of stent in coronary artery in patient with coronary artery disease 02/26/2014  . Renal artery stenosis ([place])   . Type 2 diabetes mellitus ([place]) 02/26/2014   Past Surgical History:  Procedure Laterality Date  . ABDOMINAL HYSTERECTOMY    . APPENDECTOMY    . BACK SURGERY    . CHOLECYSTECTOMY    . CORONARY ARTERY BYPASS GRAFT     2004, [place]  . Hip replacement    . KNEE SURGERY Bilateral   . NASAL SINUS SURGERY    . SHOULDER SURGERY    . TONSILLECTOMY     Social History  Substance Use Topics  . Smoking status: Never Smoker  . Smokeless tobacco: Never Used  . Alcohol use No     ROS:  As above   Medications: Current Outpatient Prescriptions  Medication Sig Dispense Refill  . AMBULATORY NON FORMULARY MEDICATION Insulin needles for lantus solastar  31g x4 100 each 11  . AMBULATORY NON FORMULARY MEDICATION Diabetic Test Strips and Lancets: One Touch Ultra  Blue  Dx Type 2 Diabetes.e11.22  Use to check blood sugar twice a day. 100 each PRN  . Calcium Carbonate-Vitamin D (CALTRATE 600+D PO) Take by mouth 2 (two) times daily.    . carvedilol (COREG) 12.5 MG tablet Take 1 tablet (12.5 mg total) by mouth 2 (two) times daily with a meal. 60 tablet 2  . ELIQUIS 5 MG TABS tablet TAKE 1 TABLET(5 MG) BY MOUTH TWICE DAILY 60 tablet 0  . escitalopram (LEXAPRO) 5 MG tablet TAKE 1 TABLET BY MOUTH DAILY 90 tablet 0  . FeFum-FePoly-FA-B Cmp-C-Biot (INTEGRA PLUS) CAPS TAKE 1 CAPSULE BY MOUTH DAILY 90 capsule 0  . Fluticasone-Salmeterol (ADVAIR DISKUS) 500-50 MCG/DOSE AEPB Inhale 1 puff into the lungs 2 (two) times daily. 60 each 6  . gabapentin (NEURONTIN) 300 MG capsule TAKE 1 CAPSULE(300 MG) BY MOUTH THREE TIMES DAILY 90 capsule 1  . Insulin Pen Needle (BD PEN NEEDLE NANO U/F) 32G X 4 MM MISC Inject into skin once daily. Use with insulin pen. DX DM ICD-10 E11.22 100 each prn  . ipratropium-albuterol (DUONEB) 0.5-2.5 (3) MG/3ML SOLN Take 3 mLs by nebulization every 2 (two) hours as needed (wheeze, SOB, cough). 60 mL 1  . IRON PO Take by mouth.    Marland Kitchen JANUVIA 50 MG tablet TAKE 1 TABLET BY MOUTH EVERY DAY 90 tablet 0  . levothyroxine (SYNTHROID, LEVOTHROID) 88 MCG tablet TAKE 1 TABLET  BY MOUTH DAILY BEFORE BREAKFAST 90 tablet 0  . montelukast (SINGULAIR) 10 MG tablet TAKE 1 TABLET BY MOUTH AT BEDTIME 30 tablet 0  . Multiple Vitamin (MULTIVITAMIN) capsule Take 1 capsule by mouth.    . ONE TOUCH ULTRA TEST test strip CHECK BLOOD GLUCOSE TWICE DAILY 100 each 1  . oxyCODONE-acetaminophen (ROXICET) 5-325 MG tablet Take 1 tablet by mouth 2 (two) times daily as needed for severe pain. 30 tablet 0  . pantoprazole (PROTONIX) 40 MG tablet TAKE 1 TABLET BY MOUTH DAILY 90 tablet 0  . predniSONE (STERAPRED UNI-PAK 48 TAB) 10 MG (48) TBPK tablet Take by mouth daily. 12-Day taper, po 48 tablet 0  . PROVENTIL HFA 108 (90 Base) MCG/ACT inhaler INHALE 2 PUFFS BY MOUTH EVERY 4 TO 6  HOURS AS NEEDED FOR SHORTNESS OF BREATH OR WHEEZING 6.7 g 0  . TOUJEO SOLOSTAR 300 UNIT/ML SOPN INJECT 38 UNITS UNDER THE SKIN DAILY 4.5 mL 6  . umeclidinium bromide (INCRUSE ELLIPTA) 62.5 MCG/INH AEPB Inhale 1 puff into the lungs daily. 1 each 6  . VESICARE 5 MG tablet TAKE 1 TABLET BY MOUTH DAILY 30 tablet 0   No current facility-administered medications for this visit.    Allergies  Allergen Reactions  . Tetracycline Shortness Of Breath  . Tetracyclines & Related Anaphylaxis and Shortness Of Breath    dizziness  . Bactrim [Sulfamethoxazole-Trimethoprim] Other (See Comments)    dizzy dizzy dizzy  . Latex Dermatitis    Bandaids  Bandages, pulls skin  . Other Dermatitis    Bandaids   . Amlodipine     Edema  . Amoxicillin     Questionable rash  . Tape Dermatitis    Bandaids  Bandaids   . Sulfa Antibiotics Rash     Exam:  BP (!) 152/60 (BP Location: Left Arm, Patient Position: Sitting, Cuff Size: Normal)   Pulse 71   Temp (!) 97.4 F (36.3 C) (Oral)   Ht 5\' 7"  (1.702 m)   Wt 170 lb (77.1 kg)   SpO2 95%   BMI 26.63 kg/m  General: Well Developed, well nourished, and in no acute distress.  Neuro/Psych: Alert and oriented x3, extra-ocular muscles intact, able to move all 4 extremities, sensation grossly intact. Skin: Warm and dry, no rashes noted.  Respiratory: Not using accessory muscles, speaking in full sentences, trachea midline.  Cardiovascular: Pulses palpable, no extremity edema. Abdomen: Does not appear distended. MSK: L-spine nontender. Decreased motion. Lower extremity strength is diminished bilaterally. Anterior thighs are tender to palpation. Patient ambulates with a walker.   Study Result   CLINICAL DATA:  2wks ago Pt started laying down when she heard cracking in the middle of her upper back. The pain and soreness has radiated down the middle of her back to her lumbar region to ant lt groin and down front of lt leg.  EXAM: LUMBAR SPINE - COMPLETE  4+ VIEW  COMPARISON:  None.  FINDINGS: There is 25% anterior listhesis of L4 on L5. There is severe L4-5 and L5-S1 degenerative disc disease. There is severe L4-5 and L5-S1 facet arthropathy. No fractures identified in the lumbar region. Heavy calcification of the aorta noted.  IMPRESSION: Listhesis.  Degenerative changes.   Electronically Signed   By: Skipper Cliche M.D.   On: 01/01/2016 13:43      Assessment and Plan: 81 y.o. female with  Leg pain: Patient has bilateral leg pain. This is concerning for lumbar radicular pain and also for myalgias or myopathy. She  does have some symptoms that could be L5 radiculopathy however the findings are tender which does not make sense for radicular pain. She takes atorvastatin and I'm concerned she may have a myalgia. Plan for limited workup listed below. Also recommend patient discontinue atorvastatin. Plan to continue physical therapy and recheck in one month.   Orders Placed This Encounter  Procedures  . Flu Vaccine QUAD 6+ mos PF IM (Fluarix Quad PF)  . CBC  . COMPLETE METABOLIC PANEL WITH GFR  . CK  . Ambulatory referral to Physical Therapy    Referral Priority:   Routine    Referral Type:   Physical Medicine    Referral Reason:   Specialty Services Required    Requested Specialty:   Physical Therapy   No orders of the defined types were placed in this encounter.   Discussed warning signs or symptoms. Please see discharge instructions. Patient expresses understanding.

## 2017-05-10 NOTE — Patient Instructions (Signed)
Thank you for coming in today. Continue PT.  Get labs today.  STOP Atorvastatin.  Recheck with me in 4 weeks.  Return sooner if needed.    Muscle Pain, Adult Muscle pain (myalgia) may be mild or severe. In most cases, the pain lasts only a short time and it goes away without treatment. It is normal to feel some muscle pain after starting a workout program. Muscles that have not been used often will be sore at first. Muscle pain may also be caused by many other things, including:  Overuse or muscle strain, especially if you are not in shape. This is the most common cause of muscle pain.  Injury.  Bruises.  Viruses, such as the flu.  Infectious diseases.  A chronic condition that causes muscle tenderness, fatigue, and headache (fibromyalgia).  A condition, such as lupus, in which the body's disease-fighting system attacks other organs in the body (autoimmune or rheumatologic diseases).  Certain drugs, including ACE inhibitors and statins.  To diagnose the cause of your muscle pain, your health care provider will do a physical exam and ask questions about the pain and when it began. If you have not had muscle pain for very long, your health care provider may want to wait before doing much testing. If your muscle pain has lasted a long time, your health care provider may want to run tests right away. In some cases, this may include tests to rule out certain conditions or illnesses. Treatment for muscle pain depends on the cause. Home care is often enough to relieve muscle pain. Your health care provider may also prescribe anti-inflammatory medicine. Follow these instructions at home: Activity  If overuse is causing your muscle pain: ? Slow down your activities until the pain goes away. ? Do regular, gentle exercises if you are not usually active. ? Warm up before exercising. Stretch before and after exercising. This can help lower the risk of muscle pain.  Do not continue working  out if the pain is very bad. Bad pain could mean that you have injured a muscle. Managing pain and discomfort   If directed, apply ice to the sore muscle: ? Put ice in a plastic bag. ? Place a towel between your skin and the bag. ? Leave the ice on for 20 minutes, 2-3 times a day.  You may also alternate between applying ice and applying heat as told by your health care provider. To apply heat, use the heat source that your health care provider recommends, such as a moist heat pack or a heating pad. ? Place a towel between your skin and the heat source. ? Leave the heat on for 20-30 minutes. ? Remove the heat if your skin turns bright red. This is especially important if you are unable to feel pain, heat, or cold. You may have a greater risk of getting burned. Medicines  Take over-the-counter and prescription medicines only as told by your health care provider.  Do not drive or use heavy machinery while taking prescription pain medicine. Contact a health care provider if:  Your muscle pain gets worse and medicines do not help.  You have muscle pain that lasts longer than 3 days.  You have a rash or fever along with muscle pain.  You have muscle pain after a tick bite.  You have muscle pain while working out, even though you are in good physical condition.  You have redness, soreness, or swelling along with muscle pain.  You have muscle pain  after starting a new medicine or changing the dose of a medicine. Get help right away if:  You have trouble breathing.  You have trouble swallowing.  You have muscle pain along with a stiff neck, fever, and vomiting.  You have severe muscle weakness or cannot move part of your body. This information is not intended to replace advice given to you by your health care provider. Make sure you discuss any questions you have with your health care provider. Document Released: 07/22/2006 Document Revised: 03/19/2016 Document Reviewed:  01/20/2016 Elsevier Interactive Patient Education  2018 [employer].

## 2017-05-10 NOTE — Progress Notes (Signed)
HPI: Briana Werner is a 81 y.o. female  who presents to [facility] today, 05/10/17,  for chief complaint of:  Chief Complaint  Patient presents with  . Follow-up    DIABETES    Asian overall doing well since last seen here. She is coping okay with the recent unexpected death of husband. He had a lot of medical problems but recently died from heart attack, CPR by EMS for 30 minutes was unsuccessful. Patient is getting good support from family members, her daughter visits about every other day and calls on days when she doesn't come by the house. Daughter is managing medications for the most part.  Diabetes: A1c as below indicates good control. Patient is not experiencing any hypoglycemic episodes.  Atrial fibrillation: Stable, no palpitations. On Eliquis.  Patient was seen last month for rash, this has resolved.  COPD: Breathing is a bit better since we added inhaler medication. Patient does not need refills at this time    Results for orders placed or performed in visit on 05/10/17 (from the past 24 hour(s))  POCT HgB A1C     Status: None   Collection Time: 05/10/17  9:32 AM  Result Value Ref Range   Hemoglobin A1C 6.9       Past medical history, surgical history, social history and family history reviewed.  Patient Active Problem List   Diagnosis Date Noted  . CKD (chronic kidney disease) stage 4, GFR 15-29 ml/min (HCC) 02/25/2017  . Nasal polyposis 12/07/2016  . Cough 07/13/2016  . Tinea pedis 05/25/2016  . Diabetic foot ulcer ([place]) 05/25/2016  . Left hip pain 04/15/2016  . Lumbar radicular pain 01/15/2016  . Aortic atherosclerosis ([place]) 01/12/2016  . Anemia 02/16/2015  . Atrial fibrillation ([place]) 12/25/2014  . Diabetes mellitus with kidney complication ([place]) 29/51/8841  . Spondylisthesis 05/31/2014  . CAD (coronary artery disease) 05/01/2014  . Renal artery stenosis ([place]) 05/01/2014  . Generalized anxiety disorder 04/03/2014  .  CKD (chronic kidney disease) stage 3, GFR 30-59 ml/min 03/26/2014  . Chronic atrial flutter ([place]) 03/19/2014  . Carotid artery stenosis 03/19/2014  . Hypercalcemia 02/27/2014  . Essential hypertension, benign 02/26/2014  . Hypothyroid 02/26/2014  . Hyperlipidemia 02/26/2014  . COPD (chronic obstructive pulmonary disease) ([place]) 02/26/2014  . Dementia 02/26/2014  . Presence of stent in coronary artery in patient with coronary artery disease 02/26/2014  . Nephrolithiasis 02/26/2014  . Diabetic peripheral neuropathy ([place]) 02/26/2014  . DVT, lower extremity, recurrent ([place]) 02/26/2014  . Status post left hip replacement 02/26/2014  . Overactive bladder 02/26/2014    Current medication list and allergy/intolerance information reviewed.   Current Outpatient Prescriptions on File Prior to Visit  Medication Sig Dispense Refill  . AMBULATORY NON FORMULARY MEDICATION Insulin needles for lantus solastar  31g x4 100 each 11  . AMBULATORY NON FORMULARY MEDICATION Diabetic Test Strips and Lancets: One Touch Ultra Blue  Dx Type 2 Diabetes.e11.22  Use to check blood sugar twice a day. 100 each PRN  . Calcium Carbonate-Vitamin D (CALTRATE 600+D PO) Take by mouth 2 (two) times daily.    . carvedilol (COREG) 12.5 MG tablet Take 1 tablet (12.5 mg total) by mouth 2 (two) times daily with a meal. 60 tablet 2  . ELIQUIS 5 MG TABS tablet TAKE 1 TABLET(5 MG) BY MOUTH TWICE DAILY 60 tablet 0  . escitalopram (LEXAPRO) 5 MG tablet TAKE 1 TABLET BY MOUTH DAILY 90 tablet 0  . FeFum-FePoly-FA-B Cmp-C-Biot (INTEGRA PLUS) CAPS TAKE  1 CAPSULE BY MOUTH DAILY 90 capsule 0  . Fluticasone-Salmeterol (ADVAIR DISKUS) 500-50 MCG/DOSE AEPB Inhale 1 puff into the lungs 2 (two) times daily. 60 each 6  . gabapentin (NEURONTIN) 300 MG capsule TAKE 1 CAPSULE(300 MG) BY MOUTH THREE TIMES DAILY 90 capsule 1  . Insulin Pen Needle (BD PEN NEEDLE NANO U/F) 32G X 4 MM MISC Inject into skin once daily. Use with insulin pen. DX DM ICD-10  E11.22 100 each prn  . ipratropium-albuterol (DUONEB) 0.5-2.5 (3) MG/3ML SOLN Take 3 mLs by nebulization every 2 (two) hours as needed (wheeze, SOB, cough). 60 mL 1  . IRON PO Take by mouth.    Marland Kitchen JANUVIA 50 MG tablet TAKE 1 TABLET BY MOUTH EVERY DAY 90 tablet 0  . levothyroxine (SYNTHROID, LEVOTHROID) 88 MCG tablet TAKE 1 TABLET BY MOUTH DAILY BEFORE BREAKFAST 90 tablet 0  . montelukast (SINGULAIR) 10 MG tablet TAKE 1 TABLET BY MOUTH AT BEDTIME 30 tablet 0  . Multiple Vitamin (MULTIVITAMIN) capsule Take 1 capsule by mouth.    . ONE TOUCH ULTRA TEST test strip CHECK BLOOD GLUCOSE TWICE DAILY 100 each 1  . oxyCODONE-acetaminophen (ROXICET) 5-325 MG tablet Take 1 tablet by mouth 2 (two) times daily as needed for severe pain. 30 tablet 0  . pantoprazole (PROTONIX) 40 MG tablet TAKE 1 TABLET BY MOUTH DAILY 90 tablet 0  . predniSONE (STERAPRED UNI-PAK 48 TAB) 10 MG (48) TBPK tablet Take by mouth daily. 12-Day taper, po 48 tablet 0  . PROVENTIL HFA 108 (90 Base) MCG/ACT inhaler INHALE 2 PUFFS BY MOUTH EVERY 4 TO 6 HOURS AS NEEDED FOR SHORTNESS OF BREATH OR WHEEZING 6.7 g 0  . TOUJEO SOLOSTAR 300 UNIT/ML SOPN INJECT 38 UNITS UNDER THE SKIN DAILY 4.5 mL 6  . umeclidinium bromide (INCRUSE ELLIPTA) 62.5 MCG/INH AEPB Inhale 1 puff into the lungs daily. 1 each 6  . VESICARE 5 MG tablet TAKE 1 TABLET BY MOUTH DAILY 30 tablet 0   No current facility-administered medications on file prior to visit.    Allergies  Allergen Reactions  . Tetracycline Shortness Of Breath  . Tetracyclines & Related Anaphylaxis and Shortness Of Breath    dizziness  . Bactrim [Sulfamethoxazole-Trimethoprim] Other (See Comments)    dizzy dizzy dizzy  . Latex Dermatitis    Bandaids  Bandages, pulls skin  . Other Dermatitis    Bandaids   . Amlodipine     Edema  . Amoxicillin     Questionable rash  . Tape Dermatitis    Bandaids  Bandaids   . Sulfa Antibiotics Rash      Review of Systems:  Constitutional: No recent  illness, feel swell today   HEENT: No  headache, no vision change  Cardiac: No  chest pain, No  pressure, No palpitations  Respiratory:  No  shortness of breath. No  Cough  Gastrointestinal: No  abdominal pain  Musculoskeletal: +new myalgia/arthralgia - seen by Sports Med - Dr Georgina Snell today   Skin: No  Rash  Psychiatric: No  concerns with depression, No  concerns with anxiety  Exam:  BP (!) 148/68   Pulse (!) 58   Ht 5\' 7"  (1.702 m)   Wt 170 lb (77.1 kg)   BMI 26.63 kg/m   Constitutional: VS see above. General Appearance: alert, well-developed, well-nourished, NAD  Eyes: Normal lids and conjunctive, non-icteric sclera  Ears, Nose, Mouth, Throat: MMM, Normal external inspection ears/nares/mouth/lips/gums.  Neck: No masses, trachea midline.   Respiratory: Normal respiratory effort.  no wheeze, no rhonchi, no rales  Cardiovascular: S1/S2 normal, no rub/gallop auscultated. Irreg/Irreg   Musculoskeletal: Gait normal. Symmetric and independent movement of all extremities  Neurological: Normal balance/coordination. No tremor.  Skin: warm, dry, intact.   Psychiatric: Normal judgment/insight. Normal mood and affect. Oriented x3.    Recent Results (from the past 2160 hour(s))  COMPLETE METABOLIC PANEL WITH GFR     Status: Abnormal   Collection Time: 02/25/17 10:52 AM  Result Value Ref Range   Sodium 140 135 - 146 mmol/L   Potassium 4.7 3.5 - 5.3 mmol/L   Chloride 105 98 - 110 mmol/L   CO2 22 20 - 31 mmol/L   Glucose, Bld 149 (H) 65 - 99 mg/dL   BUN 32 (H) 7 - 25 mg/dL   Creat 1.58 (H) 0.60 - 0.93 mg/dL    Comment:   For patients > or = 79 years of age: The upper reference limit for Creatinine is approximately 13% higher for people identified as African-American.      Total Bilirubin 0.6 0.2 - 1.2 mg/dL   Alkaline Phosphatase 119 33 - 130 U/L   AST 28 10 - 35 U/L   ALT 12 6 - 29 U/L   Total Protein 6.6 6.1 - 8.1 g/dL   Albumin 4.0 3.6 - 5.1 g/dL   Calcium 9.1 8.6  - 10.4 mg/dL   GFR, Est African American 36 (L) >=60 mL/min   GFR, Est Non African American 31 (L) >=60 mL/min  Hemoglobin A1c     Status: Abnormal   Collection Time: 02/25/17 10:52 AM  Result Value Ref Range   Hgb A1c MFr Bld 6.3 (H) <5.7 %    Comment:   For someone without known diabetes, a hemoglobin A1c value between 5.7% and 6.4% is consistent with prediabetes and should be confirmed with a follow-up test.   For someone with known diabetes, a value <7% indicates that their diabetes is well controlled. A1c targets should be individualized based on duration of diabetes, age, co-morbid conditions and other considerations.   This assay result is consistent with an increased risk of diabetes.   Currently, no consensus exists regarding use of hemoglobin A1c for diagnosis of diabetes in children.      Mean Plasma Glucose 134 mg/dL  POCT HgB A1C     Status: None   Collection Time: 05/10/17  9:32 AM  Result Value Ref Range   Hemoglobin A1C 6.9     No results found.  No flowsheet data found.  Depression screen [facility] 2/9 08/03/2016 07/22/2016 05/25/2016  Decreased Interest 0 0 0  Down, Depressed, Hopeless 0 0 0  PHQ - 2 Score 0 0 0  Altered sleeping - - -  Tired, decreased energy - - -  Change in appetite - - -  Feeling bad or failure about yourself  - - -  Trouble concentrating - - -  Moving slowly or fidgety/restless - - -  Suicidal thoughts - - -  PHQ-9 Score - - -      ASSESSMENT/PLAN:   Chronic medical conditions appear stable at this point.   Patient is overall coping fairly well with recent death of her husband, declines grief counseling referral or other intervention, she states she is overall not too depressed since she knows how sick he was. She is advised to let me know if this is something she would like to talk about more  Diabetes mellitus without complication ([place]) - Plan: POCT HgB A1C  Essential  hypertension, benign  Chronic atrial fibrillation  (HCC)  Pulmonary emphysema, unspecified emphysema type [facility])   Reviewed Dr. Clovis Riley notes, she will be stopping her atorvastatin to see if this may help like pain. Patient is advised to show her visit summaries to her daughter to help making sure she is on the correct medications every day   Follow-up plan: Return in about 3 months (around 08/10/2017) for recheck diabetes, sooner if needed and as directed by Dr. Georgina Snell .   Visit summary with medication list and pertinent instructions was printed for patient to review, alert [place] if any changes needed. All questions at time of visit were answered - patient instructed to contact office with any additional concerns. ER/RTC precautions were reviewed with the patient and understanding verbalized.   Note: Total time spent 25 minutes, greater than 50% of the visit was spent face-to-face counseling and coordinating care for the following: The primary encounter diagnosis was Diabetes mellitus without complication ([place]). Diagnoses of Essential hypertension, benign, Chronic atrial fibrillation (HCC), and Pulmonary emphysema, unspecified emphysema type ([place]) were also pertinent to this visit.Marland Kitchen

## 2017-05-11 LAB — COMPLETE METABOLIC PANEL WITH GFR
ALT: 15 U/L (ref 6–29)
AST: 37 U/L — ABNORMAL HIGH (ref 10–35)
Albumin: 4.4 g/dL (ref 3.6–5.1)
Alkaline Phosphatase: 123 U/L (ref 33–130)
BUN: 31 mg/dL — ABNORMAL HIGH (ref 7–25)
CALCIUM: 9.8 mg/dL (ref 8.6–10.4)
CHLORIDE: 105 mmol/L (ref 98–110)
CO2: 21 mmol/L (ref 20–32)
Creat: 1.45 mg/dL — ABNORMAL HIGH (ref 0.60–0.93)
GFR, EST AFRICAN AMERICAN: 40 mL/min — AB (ref >=60)
GFR, EST NON AFRICAN AMERICAN: 35 mL/min — AB (ref >=60)
Glucose, Bld: 74 mg/dL (ref 65–99)
POTASSIUM: 4.6 mmol/L (ref 3.5–5.3)
Sodium: 143 mmol/L (ref 135–146)
Total Bilirubin: 0.8 mg/dL (ref 0.2–1.2)
Total Protein: 7.2 g/dL (ref 6.1–8.1)

## 2017-05-11 LAB — CK: CK TOTAL: 66 U/L (ref 29–143)

## 2017-05-27 ENCOUNTER — Ambulatory Visit: Payer: Medicare Other | Admitting: Osteopathic Medicine

## 2017-06-02 DIAGNOSIS — J441 Chronic obstructive pulmonary disease with (acute) exacerbation: Secondary | ICD-10-CM | POA: Diagnosis not present

## 2017-06-02 DIAGNOSIS — I517 Cardiomegaly: Secondary | ICD-10-CM | POA: Diagnosis not present

## 2017-06-02 DIAGNOSIS — E119 Type 2 diabetes mellitus without complications: Secondary | ICD-10-CM | POA: Diagnosis not present

## 2017-06-02 DIAGNOSIS — I509 Heart failure, unspecified: Secondary | ICD-10-CM | POA: Diagnosis not present

## 2017-06-02 DIAGNOSIS — R0789 Other chest pain: Secondary | ICD-10-CM | POA: Diagnosis present

## 2017-06-02 DIAGNOSIS — I080 Rheumatic disorders of both mitral and aortic valves: Secondary | ICD-10-CM | POA: Diagnosis not present

## 2017-06-02 DIAGNOSIS — E039 Hypothyroidism, unspecified: Secondary | ICD-10-CM | POA: Diagnosis present

## 2017-06-02 DIAGNOSIS — J189 Pneumonia, unspecified organism: Secondary | ICD-10-CM | POA: Diagnosis not present

## 2017-06-02 DIAGNOSIS — N179 Acute kidney failure, unspecified: Secondary | ICD-10-CM | POA: Diagnosis not present

## 2017-06-02 DIAGNOSIS — I2510 Atherosclerotic heart disease of native coronary artery without angina pectoris: Secondary | ICD-10-CM | POA: Diagnosis present

## 2017-06-02 DIAGNOSIS — E1142 Type 2 diabetes mellitus with diabetic polyneuropathy: Secondary | ICD-10-CM | POA: Diagnosis present

## 2017-06-02 DIAGNOSIS — Z951 Presence of aortocoronary bypass graft: Secondary | ICD-10-CM | POA: Diagnosis not present

## 2017-06-02 DIAGNOSIS — R0602 Shortness of breath: Secondary | ICD-10-CM | POA: Diagnosis not present

## 2017-06-02 DIAGNOSIS — Z794 Long term (current) use of insulin: Secondary | ICD-10-CM | POA: Diagnosis not present

## 2017-06-02 DIAGNOSIS — E1122 Type 2 diabetes mellitus with diabetic chronic kidney disease: Secondary | ICD-10-CM | POA: Diagnosis not present

## 2017-06-02 DIAGNOSIS — N183 Chronic kidney disease, stage 3 (moderate): Secondary | ICD-10-CM | POA: Diagnosis not present

## 2017-06-27 ENCOUNTER — Other Ambulatory Visit: Payer: Self-pay | Admitting: Osteopathic Medicine

## 2017-06-28 ENCOUNTER — Telehealth: Payer: Self-pay | Admitting: Osteopathic Medicine

## 2017-06-28 NOTE — Telephone Encounter (Signed)
dONE.

## 2017-06-29 ENCOUNTER — Ambulatory Visit (INDEPENDENT_AMBULATORY_CARE_PROVIDER_SITE_OTHER): Payer: Medicare Other | Admitting: Osteopathic Medicine

## 2017-06-29 ENCOUNTER — Encounter: Payer: Self-pay | Admitting: Osteopathic Medicine

## 2017-06-29 VITALS — BP 143/74 | HR 88 | Ht 67.0 in | Wt 172.0 lb

## 2017-06-29 DIAGNOSIS — E162 Hypoglycemia, unspecified: Secondary | ICD-10-CM

## 2017-06-29 DIAGNOSIS — J449 Chronic obstructive pulmonary disease, unspecified: Secondary | ICD-10-CM | POA: Diagnosis not present

## 2017-06-29 DIAGNOSIS — J189 Pneumonia, unspecified organism: Secondary | ICD-10-CM | POA: Diagnosis not present

## 2017-06-29 DIAGNOSIS — I482 Chronic atrial fibrillation, unspecified: Secondary | ICD-10-CM

## 2017-06-29 DIAGNOSIS — I2583 Coronary atherosclerosis due to lipid rich plaque: Secondary | ICD-10-CM | POA: Diagnosis not present

## 2017-06-29 DIAGNOSIS — I10 Essential (primary) hypertension: Secondary | ICD-10-CM

## 2017-06-29 DIAGNOSIS — E11649 Type 2 diabetes mellitus with hypoglycemia without coma: Secondary | ICD-10-CM

## 2017-06-29 DIAGNOSIS — I2510 Atherosclerotic heart disease of native coronary artery without angina pectoris: Secondary | ICD-10-CM | POA: Diagnosis not present

## 2017-06-29 DIAGNOSIS — Z794 Long term (current) use of insulin: Secondary | ICD-10-CM | POA: Diagnosis not present

## 2017-06-29 DIAGNOSIS — E119 Type 2 diabetes mellitus without complications: Secondary | ICD-10-CM | POA: Diagnosis not present

## 2017-06-29 LAB — COMPLETE METABOLIC PANEL WITH GFR
AG Ratio: 1.5 (calc) (ref 1.0–2.5)
ALKALINE PHOSPHATASE (APISO): 110 U/L (ref 33–130)
ALT: 14 U/L (ref 6–29)
AST: 26 U/L (ref 10–35)
Albumin: 3.8 g/dL (ref 3.6–5.1)
BUN/Creatinine Ratio: 17 (calc) (ref 6–22)
BUN: 26 mg/dL — AB (ref 7–25)
CALCIUM: 9.3 mg/dL (ref 8.6–10.4)
CO2: 27 mmol/L (ref 20–32)
CREATININE: 1.52 mg/dL — AB (ref 0.60–0.93)
Chloride: 106 mmol/L (ref 98–110)
GFR, EST NON AFRICAN AMERICAN: 32 mL/min/{1.73_m2} — AB (ref >=60)
GFR, Est African American: 38 mL/min/{1.73_m2} — ABNORMAL LOW (ref >=60)
GLUCOSE: 181 mg/dL — AB (ref 65–99)
Globulin: 2.6 g/dL (calc) (ref 1.9–3.7)
Potassium: 4.6 mmol/L (ref 3.5–5.3)
Sodium: 141 mmol/L (ref 135–146)
Total Bilirubin: 0.5 mg/dL (ref 0.2–1.2)
Total Protein: 6.4 g/dL (ref 6.1–8.1)

## 2017-06-29 LAB — CBC
HEMATOCRIT: 33.9 % — AB (ref 35.0–45.0)
HEMOGLOBIN: 11.1 g/dL — AB (ref 11.7–15.5)
MCH: 28.2 pg (ref 27.0–33.0)
MCHC: 32.7 g/dL (ref 32.0–36.0)
MCV: 86.3 fL (ref 80.0–100.0)
MPV: 10.4 fL (ref 7.5–12.5)
Platelets: 234 10*3/uL (ref 140–400)
RBC: 3.93 10*6/uL (ref 3.80–5.10)
RDW: 14.4 % (ref 11.0–15.0)
WBC: 7.1 10*3/uL (ref 3.8–10.8)

## 2017-06-29 LAB — POCT GLYCOSYLATED HEMOGLOBIN (HGB A1C): Hemoglobin A1C: 7.4

## 2017-06-29 LAB — TSH: TSH: 6.4 m[IU]/L — AB (ref 0.40–4.50)

## 2017-06-29 LAB — GLUCOSE, POCT (MANUAL RESULT ENTRY): POC GLUCOSE: 182 mg/dL — AB (ref 70–99)

## 2017-06-29 NOTE — Patient Instructions (Addendum)
Plan:   Sugars  Decrease Toujeo to 25 units for now.  Keep monitoring sugars as you are doing and let's recheck things in a few days.   If sugars are still dropping, decrease to 15 units and stop the Januvia and come see me.  Eat small healthy snacks throughout the day, avoid infrequent big meals. Be sure to eat a snack or small meal before bedtime.   Incontinence   Please call [facility] regarding your incontinence follow-up: [phone]

## 2017-06-29 NOTE — Progress Notes (Signed)
HPI: Briana Werner is a 81 y.o. female  who presents to [facility] today, 06/29/17,  for chief complaint of:  Sugars dropping  DM Rx:    Januvia 50 mg daily Toujeo 38 units daily - takes in AM  Recent hospitalization in [place] for PNA/COPD - pt brings discharge records from there. No official discharge summary is available, she has patient discharge paperwork. Admitted 06/02/17 and hospitalized for 3.5 days. On review of discharge paperwork, it does note recommendations for follow-up with PCP in 1 week for PFT, BM P, need for continued diuretics. Medication lists at time of discharge includes new medications DuoNeb 4 times a day, prednisone taper, Zithromax.  See record of home sugar levels below.,AM hypoglycemia to 45-50. Would correct over the course of an hour but then up to 200s. Sugar levels were much more labile while she was still on prednisone taper after hospital discharge. Hyperglycemic episodes seem to have resolved. She is not typically having symptoms when fasting levels are in 80s to 90s.          Past medical, surgical, social and family history reviewed: Patient Active Problem List   Diagnosis Date Noted  . CKD (chronic kidney disease) stage 4, GFR 15-29 ml/min (HCC) 02/25/2017  . Nasal polyposis 12/07/2016  . Cough 07/13/2016  . Tinea pedis 05/25/2016  . Diabetic foot ulcer ([place]) 05/25/2016  . Left hip pain 04/15/2016  . Lumbar radicular pain 01/15/2016  . Aortic atherosclerosis ([place]) 01/12/2016  . Anemia 02/16/2015  . Atrial fibrillation ([place]) 12/25/2014  . Diabetes mellitus with kidney complication ([place]) 29/51/8841  . Spondylisthesis 05/31/2014  . CAD (coronary artery disease) 05/01/2014  . Renal artery stenosis ([place]) 05/01/2014  . Generalized anxiety disorder 04/03/2014  . CKD (chronic kidney disease) stage 3, GFR 30-59 ml/min (HCC) 03/26/2014  . Chronic atrial flutter ([place]) 03/19/2014  . Carotid artery stenosis 03/19/2014    . Hypercalcemia 02/27/2014  . Essential hypertension, benign 02/26/2014  . Hypothyroid 02/26/2014  . Hyperlipidemia 02/26/2014  . COPD (chronic obstructive pulmonary disease) ([place]) 02/26/2014  . Dementia 02/26/2014  . Presence of stent in coronary artery in patient with coronary artery disease 02/26/2014  . Nephrolithiasis 02/26/2014  . Diabetic peripheral neuropathy ([place]) 02/26/2014  . DVT, lower extremity, recurrent ([place]) 02/26/2014  . Status post left hip replacement 02/26/2014  . Overactive bladder 02/26/2014   Past Surgical History:  Procedure Laterality Date  . ABDOMINAL HYSTERECTOMY    . APPENDECTOMY    . BACK SURGERY    . CHOLECYSTECTOMY    . CORONARY ARTERY BYPASS GRAFT     2004, [place]  . Hip replacement    . KNEE SURGERY Bilateral   . NASAL SINUS SURGERY    . SHOULDER SURGERY    . TONSILLECTOMY     Social History  Substance Use Topics  . Smoking status: Never Smoker  . Smokeless tobacco: Never Used  . Alcohol use No   Family History  Problem Relation Age of Onset  . CAD Father   . Heart attack Father      Current medication list and allergy/intolerance information reviewed:   Current Outpatient Prescriptions  Medication Sig Dispense Refill  . AMBULATORY NON FORMULARY MEDICATION Insulin needles for lantus solastar  31g x4 100 each 11  . AMBULATORY NON FORMULARY MEDICATION Diabetic Test Strips and Lancets: One Touch Ultra Blue  Dx Type 2 Diabetes.e11.22  Use to check blood sugar twice a day. 100 each PRN  . Calcium Carbonate-Vitamin D (  CALTRATE 600+D PO) Take by mouth 2 (two) times daily.    . carvedilol (COREG) 12.5 MG tablet Take 1 tablet (12.5 mg total) by mouth 2 (two) times daily with a meal. 180 tablet 3  . ELIQUIS 5 MG TABS tablet TAKE 1 TABLET(5 MG) BY MOUTH TWICE DAILY 60 tablet 0  . escitalopram (LEXAPRO) 5 MG tablet TAKE 1 TABLET BY MOUTH DAILY 90 tablet 0  . FeFum-FePoly-FA-B Cmp-C-Biot (INTEGRA PLUS) CAPS TAKE 1 CAPSULE BY MOUTH DAILY 90  capsule 0  . Fluticasone-Salmeterol (ADVAIR DISKUS) 500-50 MCG/DOSE AEPB Inhale 1 puff into the lungs 2 (two) times daily. 60 each 6  . gabapentin (NEURONTIN) 300 MG capsule TAKE 1 CAPSULE(300 MG) BY MOUTH THREE TIMES DAILY 90 capsule 1  . Insulin Pen Needle (BD PEN NEEDLE NANO U/F) 32G X 4 MM MISC Inject into skin once daily. Use with insulin pen. DX DM ICD-10 E11.22 100 each prn  . ipratropium-albuterol (DUONEB) 0.5-2.5 (3) MG/3ML SOLN Take 3 mLs by nebulization every 2 (two) hours as needed (wheeze, SOB, cough). 60 mL 1  . IRON PO Take by mouth.    Marland Kitchen JANUVIA 50 MG tablet TAKE 1 TABLET BY MOUTH EVERY DAY 90 tablet 0  . levothyroxine (SYNTHROID, LEVOTHROID) 88 MCG tablet TAKE 1 TABLET BY MOUTH DAILY BEFORE BREAKFAST 90 tablet 0  . montelukast (SINGULAIR) 10 MG tablet TAKE 1 TABLET BY MOUTH AT BEDTIME 30 tablet 0  . Multiple Vitamin (MULTIVITAMIN) capsule Take 1 capsule by mouth.    . ONE TOUCH ULTRA TEST test strip CHECK BLOOD GLUCOSE TWICE DAILY 100 each 1  . oxyCODONE-acetaminophen (ROXICET) 5-325 MG tablet Take 1 tablet by mouth 2 (two) times daily as needed for severe pain. 30 tablet 0  . pantoprazole (PROTONIX) 40 MG tablet TAKE 1 TABLET BY MOUTH DAILY 90 tablet 0  . pantoprazole (PROTONIX) 40 MG tablet TAKE 1 TABLET BY MOUTH DAILY 90 tablet 0  . PROVENTIL HFA 108 (90 Base) MCG/ACT inhaler INHALE 2 PUFFS BY MOUTH EVERY 4 TO 6 HOURS AS NEEDED FOR SHORTNESS OF BREATH OR WHEEZING 6.7 g 0  . TOUJEO SOLOSTAR 300 UNIT/ML SOPN INJECT 38 UNITS UNDER THE SKIN DAILY 4.5 mL 6  . umeclidinium bromide (INCRUSE ELLIPTA) 62.5 MCG/INH AEPB Inhale 1 puff into the lungs daily. 1 each 6  . VESICARE 5 MG tablet TAKE 1 TABLET BY MOUTH DAILY 30 tablet 0   No current facility-administered medications for this visit.    Allergies  Allergen Reactions  . Tetracycline Shortness Of Breath  . Tetracyclines & Related Anaphylaxis and Shortness Of Breath    dizziness  . Bactrim [Sulfamethoxazole-Trimethoprim] Other  (See Comments)    dizzy dizzy dizzy  . Latex Dermatitis    Bandaids  Bandages, pulls skin  . Other Dermatitis    Bandaids   . Amlodipine     Edema  . Amoxicillin     Questionable rash  . Tape Dermatitis    Bandaids  Bandaids   . Sulfa Antibiotics Rash      Review of Systems:  Constitutional:  No  fever, no chills, +recent illness, +unintentional weight gain. +significant fatigue after hospitalization  HEENT: No  headache, no vision change  Cardiac: No  chest pain, No  pressure, No palpitations  Respiratory:  No  shortness of breath. No  Cough  Gastrointestinal: No  abdominal pain, No  nausea, No  vomiting,  No  blood in stool, No  diarrhea, No  constipation   Musculoskeletal: No new myalgia/arthralgia  Genitourinary: +incontinence, No  abnormal genital bleeding, No abnormal genital discharge  Skin: No  Rash, No other wounds/concerning lesions  Endocrine: No cold intolerance,  No heat intolerance. No polyuria/polydipsia/polyphagia, +hypoglycemia as per HPI  Neurologic: No  weakness, No  dizziness  Psychiatric: No  concerns with depression, No  concerns with anxiety, No sleep problems, No mood problems  Exam:  BP (!) 143/74   Pulse 88   Ht 5\' 7"  (1.702 m)   Wt 172 lb (78 kg)   BMI 26.94 kg/m   Constitutional: VS see above. General Appearance: alert, well-developed, well-nourished, NAD  Eyes: Normal lids and conjunctive, non-icteric sclera  Ears, Nose, Mouth, Throat: MMM, Normal external inspection ears/nares/mouth/lips/gums.   Neck: No masses, trachea midline. No thyroid enlargement.  Respiratory: Normal respiratory effort. no wheeze, no rhonchi, no rales  Cardiovascular: S1/S2 normal, no murmur, no rub/gallop auscultated. Irregularly irregular rhythm, normal rate. No lower extremity edema.  Neurological: Normal balance/coordination. No tremor.   Skin: warm, dry, intact. No rash/ulcer.  Psychiatric: Normal judgment/insight. Normal mood and affect.  Oriented x3.    Results for orders placed or performed in visit on 06/29/17 (from the past 72 hour(s))  POCT HgB A1C     Status: None   Collection Time: 06/29/17 10:22 AM  Result Value Ref Range   Hemoglobin A1C 7.4   POCT glucose (manual entry)     Status: Abnormal   Collection Time: 06/29/17 10:22 AM  Result Value Ref Range   POC Glucose 182 (A) 70 - 99 mg/dl    ASSESSMENT/PLAN:   Type 2 diabetes mellitus with hypoglycemia without coma, with long-term current use of insulin (HCC) - Over the past few days, no symptoms or severe lows. We'll back off on insulin and see how this does - Plan: POCT HgB A1C, CBC, COMPLETE METABOLIC PANEL WITH GFR, TSH  Hypoglycemia - Reminder set to call patient in 2 days before the weekend to see how she's doing. Has already taken insulin today - Plan: POCT glucose (manual entry)  Community acquired pneumonia, unspecified laterality - Appears resolved, lungs sound clear today  Chronic atrial fibrillation ([place]) - Appears stable  Essential hypertension, benign - Blood pressure improved on recheck    Patient Instructions  Plan:   Sugars  Decrease Toujeo to 25 units for now.  Keep monitoring sugars as you are doing and let's recheck things in a few days.   If sugars are still dropping, decrease to 15 units and stop the Januvia and come see me.  Eat small healthy snacks throughout the day, avoid infrequent big meals. Be sure to eat a snack or small meal before bedtime.   Incontinence   Please call [facility] regarding your incontinence follow-up: [phone]     Visit summary with medication list and pertinent instructions was printed for patient to review. All questions at time of visit were answered - patient instructed to contact office with any additional concerns. ER/RTC precautions were reviewed with the patient. Follow-up plan: Return in about 1 week (around 07/06/2017) for recheck sugars, sooner if needed .  Note: Total time  spent 40 minutes, greater than 50% of the visit was spent face-to-face counseling and coordinating care In reviewing hospital records and other documentation for the following: The primary encounter diagnosis was Type 2 diabetes mellitus with hypoglycemia without coma, with long-term current use of insulin ([place]). Diagnoses of Hypoglycemia, Community acquired pneumonia, unspecified laterality, Chronic atrial fibrillation ([place]), and Essential hypertension, benign were also pertinent to this visit.Marland Kitchen

## 2017-07-01 ENCOUNTER — Telehealth: Payer: Self-pay | Admitting: Osteopathic Medicine

## 2017-07-01 NOTE — Telephone Encounter (Signed)
Called to follow-up on whether she was still experiencing hypoglycemia. No answer, left message call the clinic back at main number [phone]. Will discuss further at follow-up appointment

## 2017-07-06 ENCOUNTER — Ambulatory Visit (INDEPENDENT_AMBULATORY_CARE_PROVIDER_SITE_OTHER): Payer: Medicare Other | Admitting: Osteopathic Medicine

## 2017-07-06 ENCOUNTER — Encounter: Payer: Self-pay | Admitting: Osteopathic Medicine

## 2017-07-06 VITALS — BP 135/70 | HR 75 | Ht 67.0 in | Wt 172.0 lb

## 2017-07-06 DIAGNOSIS — I2583 Coronary atherosclerosis due to lipid rich plaque: Secondary | ICD-10-CM

## 2017-07-06 DIAGNOSIS — E11649 Type 2 diabetes mellitus with hypoglycemia without coma: Secondary | ICD-10-CM | POA: Diagnosis not present

## 2017-07-06 DIAGNOSIS — I2510 Atherosclerotic heart disease of native coronary artery without angina pectoris: Secondary | ICD-10-CM | POA: Diagnosis not present

## 2017-07-06 DIAGNOSIS — I10 Essential (primary) hypertension: Secondary | ICD-10-CM

## 2017-07-06 DIAGNOSIS — Z794 Long term (current) use of insulin: Secondary | ICD-10-CM

## 2017-07-06 MED ORDER — INSULIN GLARGINE 300 UNIT/ML ~~LOC~~ SOPN: 10.0000 [IU] | PEN_INJECTOR | Freq: Every day | SUBCUTANEOUS | 6 refills | Status: DC

## 2017-07-06 NOTE — Patient Instructions (Addendum)
Plan:  Continue lower dose of Insulin - can even go down to 10 units   Goal fasting sugars: 110-140, no lower than 80 or 90 for sure!

## 2017-07-06 NOTE — Progress Notes (Signed)
HPI: Briana Werner is a 81 y.o. female  who presents to [facility] today, 07/06/17,  for chief complaint of:  Sugars dropping - followup  Last week, was seen with concerns for hypoglycemia down as low as 40-50.  We backed off on her insulin dose, she is currently taking 15 units of Toujeo daily, down from 38.  She is feeling well.  Fasting blood sugars as noted below no additional hypoglycemic episodes except for one down into the 80s.  Has not had any loss of consciousness with any of these episodes.  Hypoglycemia has not been a problem for her prior to recent hospitalization in [place] for pneumonia, since then she reports low blood sugars, see previous progress note for full details.  At any rate, after decreasing the insulin dose she is feeling a good bit better            Past medical, surgical, social and family history reviewed: Patient Active Problem List   Diagnosis Date Noted  . CKD (chronic kidney disease) stage 4, GFR 15-29 ml/min (HCC) 02/25/2017  . Nasal polyposis 12/07/2016  . Cough 07/13/2016  . Tinea pedis 05/25/2016  . Diabetic foot ulcer ([place]) 05/25/2016  . Left hip pain 04/15/2016  . Lumbar radicular pain 01/15/2016  . Aortic atherosclerosis ([place]) 01/12/2016  . Anemia 02/16/2015  . Atrial fibrillation ([place]) 12/25/2014  . Diabetes mellitus with kidney complication ([place]) 41/32/4401  . Spondylisthesis 05/31/2014  . CAD (coronary artery disease) 05/01/2014  . Renal artery stenosis ([place]) 05/01/2014  . Generalized anxiety disorder 04/03/2014  . CKD (chronic kidney disease) stage 3, GFR 30-59 ml/min (HCC) 03/26/2014  . Chronic atrial flutter ([place]) 03/19/2014  . Carotid artery stenosis 03/19/2014  . Hypercalcemia 02/27/2014  . Essential hypertension, benign 02/26/2014  . Hypothyroid 02/26/2014  . Hyperlipidemia 02/26/2014  . COPD (chronic obstructive pulmonary disease) ([place]) 02/26/2014  . Dementia 02/26/2014  . Presence of  stent in coronary artery in patient with coronary artery disease 02/26/2014  . Nephrolithiasis 02/26/2014  . Diabetic peripheral neuropathy ([place]) 02/26/2014  . DVT, lower extremity, recurrent ([place]) 02/26/2014  . Status post left hip replacement 02/26/2014  . Overactive bladder 02/26/2014   Past Surgical History:  Procedure Laterality Date  . ABDOMINAL HYSTERECTOMY    . APPENDECTOMY    . BACK SURGERY    . CHOLECYSTECTOMY    . CORONARY ARTERY BYPASS GRAFT     2004, [place]  . Hip replacement    . KNEE SURGERY Bilateral   . NASAL SINUS SURGERY    . SHOULDER SURGERY    . TONSILLECTOMY     Social History  Substance Use Topics  . Smoking status: Never Smoker  . Smokeless tobacco: Never Used  . Alcohol use No   Family History  Problem Relation Age of Onset  . CAD Father   . Heart attack Father      Current medication list and allergy/intolerance information reviewed:   Current Outpatient Prescriptions  Medication Sig Dispense Refill  . AMBULATORY NON FORMULARY MEDICATION Insulin needles for lantus solastar  31g x4 100 each 11  . AMBULATORY NON FORMULARY MEDICATION Diabetic Test Strips and Lancets: One Touch Ultra Blue  Dx Type 2 Diabetes.e11.22  Use to check blood sugar twice a day. 100 each PRN  . Calcium Carbonate-Vitamin D (CALTRATE 600+D PO) Take by mouth 2 (two) times daily.    . carvedilol (COREG) 12.5 MG tablet Take 1 tablet (12.5 mg total) by mouth 2 (two) times daily  with a meal. 180 tablet 3  . ELIQUIS 5 MG TABS tablet TAKE 1 TABLET(5 MG) BY MOUTH TWICE DAILY 60 tablet 0  . escitalopram (LEXAPRO) 5 MG tablet TAKE 1 TABLET BY MOUTH DAILY 90 tablet 0  . FeFum-FePoly-FA-B Cmp-C-Biot (INTEGRA PLUS) CAPS TAKE 1 CAPSULE BY MOUTH DAILY 90 capsule 0  . Fluticasone-Salmeterol (ADVAIR DISKUS) 500-50 MCG/DOSE AEPB Inhale 1 puff into the lungs 2 (two) times daily. 60 each 6  . gabapentin (NEURONTIN) 300 MG capsule TAKE 1 CAPSULE(300 MG) BY MOUTH THREE TIMES DAILY 90 capsule 1    . Insulin Pen Needle (BD PEN NEEDLE NANO U/F) 32G X 4 MM MISC Inject into skin once daily. Use with insulin pen. DX DM ICD-10 E11.22 100 each prn  . ipratropium-albuterol (DUONEB) 0.5-2.5 (3) MG/3ML SOLN Take 3 mLs by nebulization every 2 (two) hours as needed (wheeze, SOB, cough). 60 mL 1  . IRON PO Take by mouth.    Marland Kitchen JANUVIA 50 MG tablet TAKE 1 TABLET BY MOUTH EVERY DAY 90 tablet 0  . levothyroxine (SYNTHROID, LEVOTHROID) 88 MCG tablet TAKE 1 TABLET BY MOUTH DAILY BEFORE BREAKFAST 90 tablet 0  . montelukast (SINGULAIR) 10 MG tablet TAKE 1 TABLET BY MOUTH AT BEDTIME 30 tablet 0  . Multiple Vitamin (MULTIVITAMIN) capsule Take 1 capsule by mouth.    . ONE TOUCH ULTRA TEST test strip CHECK BLOOD GLUCOSE TWICE DAILY 100 each 1  . oxyCODONE-acetaminophen (ROXICET) 5-325 MG tablet Take 1 tablet by mouth 2 (two) times daily as needed for severe pain. 30 tablet 0  . pantoprazole (PROTONIX) 40 MG tablet TAKE 1 TABLET BY MOUTH DAILY 90 tablet 0  . PROVENTIL HFA 108 (90 Base) MCG/ACT inhaler INHALE 2 PUFFS BY MOUTH EVERY 4 TO 6 HOURS AS NEEDED FOR SHORTNESS OF BREATH OR WHEEZING 6.7 g 0  . TOUJEO SOLOSTAR 300 UNIT/ML SOPN INJECT 38 UNITS UNDER THE SKIN DAILY 4.5 mL 6  . umeclidinium bromide (INCRUSE ELLIPTA) 62.5 MCG/INH AEPB Inhale 1 puff into the lungs daily. 1 each 6  . VESICARE 5 MG tablet TAKE 1 TABLET BY MOUTH DAILY 30 tablet 0   No current facility-administered medications for this visit.    Allergies  Allergen Reactions  . Tetracycline Shortness Of Breath  . Tetracyclines & Related Anaphylaxis and Shortness Of Breath    dizziness  . Bactrim [Sulfamethoxazole-Trimethoprim] Other (See Comments)    dizzy dizzy dizzy  . Latex Dermatitis    Bandaids  Bandages, pulls skin  . Other Dermatitis    Bandaids   . Amlodipine     Edema  . Amoxicillin     Questionable rash  . Tape Dermatitis    Bandaids  Bandaids   . Sulfa Antibiotics Rash      Review of Systems:  Constitutional:  No   fever, no chills, +recent illness, +unintentional weight gain. +significant fatigue after hospitalization  HEENT: No  headache, no vision change  Cardiac: No  chest pain, No  pressure, No palpitations  Respiratory:  No  shortness of breath.  Endocrine: No cold intolerance,  No heat intolerance. No polyuria/polydipsia/polyphagia, +hypoglycemia as per HPI  Neurologic: No  weakness, No  dizziness   Exam:  BP 135/70   Pulse 75   Ht 5\' 7"  (1.702 m)   Wt 172 lb (78 kg)   BMI 26.94 kg/m   Constitutional: VS see above. General Appearance: alert, well-developed, well-nourished, NAD  Eyes: Normal lids and conjunctive, non-icteric sclera  Ears, Nose, Mouth, Throat: MMM, Normal  external inspection ears/nares/mouth/lips/gums.   Neck: No masses, trachea midline. No thyroid enlargement.  Respiratory: Normal respiratory effort. no wheeze, no rhonchi, no rales  Cardiovascular: S1/S2 normal, no murmur, no rub/gallop auscultated. Irregularly irregular rhythm, normal rate. No lower extremity edema.  Neurological: Normal balance/coordination. No tremor.   Skin: warm, dry, intact. No rash/ulcer.  Psychiatric: Normal judgment/insight. Normal mood and affect. Oriented x3.      ASSESSMENT/PLAN:   Type 2 diabetes mellitus with hypoglycemia without coma, with long-term current use of insulin (HCC) - Plan: Insulin Glargine (TOUJEO SOLOSTAR) 300 UNIT/ML SOPN  Essential hypertension, benign    Patient Instructions  Plan:  Continue lower dose of Insulin - can even go down to 10 units   Goal fasting sugars: 110-140, no lower than 80 or 90 for sure!      Visit summary with medication list and pertinent instructions was printed for patient to review. All questions at time of visit were answered - patient instructed to contact office with any additional concerns. ER/RTC precautions were reviewed with the patient. Follow-up plan: Return in about 3 months (around 10/06/2017) for diabetes  followup .  Note: Total time spent 15 minutes, greater than 50% of the visit was spent face-to-face counseling and coordinating care In reviewing hospital records and other documentation for the following: The primary encounter diagnosis was Type 2 diabetes mellitus with hypoglycemia without coma, with long-term current use of insulin ([place]). A diagnosis of Essential hypertension, benign was also pertinent to this visit.Marland Kitchen

## 2017-07-20 ENCOUNTER — Other Ambulatory Visit: Payer: Self-pay

## 2017-07-20 DIAGNOSIS — E1142 Type 2 diabetes mellitus with diabetic polyneuropathy: Secondary | ICD-10-CM

## 2017-07-20 DIAGNOSIS — Z794 Long term (current) use of insulin: Secondary | ICD-10-CM

## 2017-07-20 DIAGNOSIS — E119 Type 2 diabetes mellitus without complications: Secondary | ICD-10-CM

## 2017-07-20 DIAGNOSIS — IMO0001 Reserved for inherently not codable concepts without codable children: Secondary | ICD-10-CM

## 2017-07-20 MED ORDER — GLUCOSE BLOOD VI STRP: ORAL_STRIP | 99 refills | Status: AC

## 2017-07-23 ENCOUNTER — Other Ambulatory Visit: Payer: Self-pay | Admitting: Osteopathic Medicine

## 2017-08-02 ENCOUNTER — Other Ambulatory Visit: Payer: Self-pay | Admitting: Osteopathic Medicine

## 2017-08-02 DIAGNOSIS — B0223 Postherpetic polyneuropathy: Secondary | ICD-10-CM

## 2017-08-16 ENCOUNTER — Ambulatory Visit (INDEPENDENT_AMBULATORY_CARE_PROVIDER_SITE_OTHER): Payer: Medicare Other | Admitting: Osteopathic Medicine

## 2017-08-16 ENCOUNTER — Encounter: Payer: Self-pay | Admitting: Osteopathic Medicine

## 2017-08-16 VITALS — BP 186/78 | HR 76 | Wt 168.0 lb

## 2017-08-16 DIAGNOSIS — I2583 Coronary atherosclerosis due to lipid rich plaque: Secondary | ICD-10-CM | POA: Diagnosis not present

## 2017-08-16 DIAGNOSIS — J449 Chronic obstructive pulmonary disease, unspecified: Secondary | ICD-10-CM | POA: Diagnosis not present

## 2017-08-16 DIAGNOSIS — H6121 Impacted cerumen, right ear: Secondary | ICD-10-CM | POA: Diagnosis not present

## 2017-08-16 DIAGNOSIS — I10 Essential (primary) hypertension: Secondary | ICD-10-CM

## 2017-08-16 DIAGNOSIS — Z794 Long term (current) use of insulin: Secondary | ICD-10-CM

## 2017-08-16 DIAGNOSIS — R42 Dizziness and giddiness: Secondary | ICD-10-CM

## 2017-08-16 DIAGNOSIS — E11649 Type 2 diabetes mellitus with hypoglycemia without coma: Secondary | ICD-10-CM | POA: Diagnosis not present

## 2017-08-16 DIAGNOSIS — R7989 Other specified abnormal findings of blood chemistry: Secondary | ICD-10-CM | POA: Diagnosis not present

## 2017-08-16 DIAGNOSIS — I2510 Atherosclerotic heart disease of native coronary artery without angina pectoris: Secondary | ICD-10-CM

## 2017-08-16 LAB — BASIC METABOLIC PANEL WITH GFR
BUN / CREAT RATIO: 21 (calc) (ref 6–22)
BUN: 31 mg/dL — ABNORMAL HIGH (ref 7–25)
CHLORIDE: 105 mmol/L (ref 98–110)
CO2: 29 mmol/L (ref 20–32)
Calcium: 10 mg/dL (ref 8.6–10.4)
Creat: 1.51 mg/dL — ABNORMAL HIGH (ref 0.60–0.93)
GFR, Est African American: 38 mL/min/{1.73_m2} — ABNORMAL LOW (ref >=60)
GFR, Est Non African American: 33 mL/min/{1.73_m2} — ABNORMAL LOW (ref >=60)
GLUCOSE: 162 mg/dL — AB (ref 65–99)
Potassium: 5 mmol/L (ref 3.5–5.3)
SODIUM: 141 mmol/L (ref 135–146)

## 2017-08-16 LAB — CBC
HEMATOCRIT: 37.1 % (ref 35.0–45.0)
HEMOGLOBIN: 12.3 g/dL (ref 11.7–15.5)
MCH: 28.8 pg (ref 27.0–33.0)
MCHC: 33.2 g/dL (ref 32.0–36.0)
MCV: 86.9 fL (ref 80.0–100.0)
MPV: 10.6 fL (ref 7.5–12.5)
Platelets: 222 10*3/uL (ref 140–400)
RBC: 4.27 10*6/uL (ref 3.80–5.10)
RDW: 14.3 % (ref 11.0–15.0)
WBC: 10.3 10*3/uL (ref 3.8–10.8)

## 2017-08-16 LAB — TSH: TSH: 3.8 m[IU]/L (ref 0.40–4.50)

## 2017-08-16 NOTE — Progress Notes (Signed)
HPI: Briana Werner is a 81 y.o. female who  has a past medical history of Asthma, Atrial flutter ([place]), CAD (coronary artery disease), Cerebrovascular disease, Chronic renal insufficiency (02/26/2014), COPD (chronic obstructive pulmonary disease) ([place]) (02/26/2014), Hypothyroid (02/26/2014), Presence of stent in coronary artery in patient with coronary artery disease (02/26/2014), Renal artery stenosis ([place]), and Type 2 diabetes mellitus ([place]) (02/26/2014).  she presents to [facility] today, 08/16/17,  for chief complaint of:  Chief Complaint  Patient presents with  . Diabetes    Sugars up to 200's in the evening, fasting Glc 140s on occasion but other times 110s. No hypoglycemia symptoms. Occasional lightheaded with standing up too fast. Occasional SOB on exertion without chest pain. Taking 10 units usually, occasionally to 15 f higher evening Glc.   Thyroid: fatigued. Hasn't had TSH rechecked in some time.   COPD: has inhalers, compliant as below. Occasional SOB as above  HTN: no CP/SOB, BP was fine last visit and no meds changed. Reports ear pain after procedure on BP recheck  Hearing loss R ear: wax? No headache/vision change.      Past medical, surgical, social and family history reviewed:  Patient Active Problem List   Diagnosis Date Noted  . CKD (chronic kidney disease) stage 4, GFR 15-29 ml/min (HCC) 02/25/2017  . Nasal polyposis 12/07/2016  . Cough 07/13/2016  . Tinea pedis 05/25/2016  . Diabetic foot ulcer ([place]) 05/25/2016  . Left hip pain 04/15/2016  . Lumbar radicular pain 01/15/2016  . Aortic atherosclerosis ([place]) 01/12/2016  . Anemia 02/16/2015  . Atrial fibrillation ([place]) 12/25/2014  . Diabetes mellitus with kidney complication ([place]) [id number]/8250  . Spondylisthesis 05/31/2014  . CAD (coronary artery disease) 05/01/2014  . Renal artery stenosis ([place]) 05/01/2014  . Generalized anxiety disorder 04/03/2014  . CKD (chronic kidney  disease) stage 3, GFR 30-59 ml/min (HCC) 03/26/2014  . Chronic atrial flutter ([place]) 03/19/2014  . Carotid artery stenosis 03/19/2014  . Hypercalcemia 02/27/2014  . Essential hypertension, benign 02/26/2014  . Hypothyroid 02/26/2014  . Hyperlipidemia 02/26/2014  . COPD (chronic obstructive pulmonary disease) ([place]) 02/26/2014  . Dementia 02/26/2014  . Presence of stent in coronary artery in patient with coronary artery disease 02/26/2014  . Nephrolithiasis 02/26/2014  . Diabetic peripheral neuropathy ([place]) 02/26/2014  . DVT, lower extremity, recurrent ([place]) 02/26/2014  . Status post left hip replacement 02/26/2014  . Overactive bladder 02/26/2014    Past Surgical History:  Procedure Laterality Date  . ABDOMINAL HYSTERECTOMY    . APPENDECTOMY    . BACK SURGERY    . CHOLECYSTECTOMY    . CORONARY ARTERY BYPASS GRAFT     2004, [place]  . Hip replacement    . KNEE SURGERY Bilateral   . NASAL SINUS SURGERY    . SHOULDER SURGERY    . TONSILLECTOMY      Social History   Tobacco Use  . Smoking status: Never Smoker  . Smokeless tobacco: Never Used  Substance Use Topics  . Alcohol use: No    Family History  Problem Relation Age of Onset  . CAD Father   . Heart attack Father      Current medication list and allergy/intolerance information reviewed:    Current Outpatient Medications  Medication Sig Dispense Refill  . AMBULATORY NON FORMULARY MEDICATION Insulin needles for lantus solastar  31g x4 100 each 11  . AMBULATORY NON FORMULARY MEDICATION Diabetic Test Strips and Lancets: One Touch Ultra Blue  Dx Type 2 Diabetes.e11.22  Use  to check blood sugar twice a day. 100 each PRN  . Calcium Carbonate-Vitamin D (CALTRATE 600+D PO) Take by mouth 2 (two) times daily.    . carvedilol (COREG) 12.5 MG tablet Take 1 tablet (12.5 mg total) by mouth 2 (two) times daily with a meal. 180 tablet 3  . ELIQUIS 5 MG TABS tablet TAKE 1 TABLET(5 MG) BY MOUTH TWICE DAILY 60 tablet 0  .  escitalopram (LEXAPRO) 5 MG tablet TAKE 1 TABLET BY MOUTH DAILY 90 tablet 0  . FeFum-FePoly-FA-B Cmp-C-Biot (INTEGRA PLUS) CAPS TAKE 1 CAPSULE BY MOUTH DAILY 90 capsule 0  . Fluticasone-Salmeterol (ADVAIR DISKUS) 500-50 MCG/DOSE AEPB Inhale 1 puff into the lungs 2 (two) times daily. 60 each 6  . gabapentin (NEURONTIN) 300 MG capsule TAKE 1 CAPSULE(300 MG) BY MOUTH THREE TIMES DAILY 90 capsule 0  . glucose blood (ONE TOUCH ULTRA TEST) test strip Check blood sugar up to 4 times daily. Dx DM ICD 10 E11.9 insulin dependent. 200 each prn  . Insulin Glargine (TOUJEO SOLOSTAR) 300 UNIT/ML SOPN Inject 10-15 Units into the skin daily. 4.5 mL 6  . Insulin Pen Needle (BD PEN NEEDLE NANO U/F) 32G X 4 MM MISC Inject into skin once daily. Use with insulin pen. DX DM ICD-10 E11.22 100 each prn  . ipratropium-albuterol (DUONEB) 0.5-2.5 (3) MG/3ML SOLN Take 3 mLs by nebulization every 2 (two) hours as needed (wheeze, SOB, cough). 60 mL 1  . IRON PO Take by mouth.    Marland Kitchen JANUVIA 50 MG tablet TAKE 1 TABLET BY MOUTH EVERY DAY 90 tablet 0  . levothyroxine (SYNTHROID, LEVOTHROID) 88 MCG tablet TAKE 1 TABLET BY MOUTH DAILY BEFORE BREAKFAST 90 tablet 0  . montelukast (SINGULAIR) 10 MG tablet TAKE 1 TABLET BY MOUTH AT BEDTIME 30 tablet 0  . Multiple Vitamin (MULTIVITAMIN) capsule Take 1 capsule by mouth.    Marland Kitchen omeprazole (PRILOSEC) 20 MG capsule TAKE 1 CAPSULE BY MOUTH TWICE DAILY 60 capsule 0  . oxyCODONE-acetaminophen (ROXICET) 5-325 MG tablet Take 1 tablet by mouth 2 (two) times daily as needed for severe pain. 30 tablet 0  . pantoprazole (PROTONIX) 40 MG tablet TAKE 1 TABLET BY MOUTH DAILY 90 tablet 0  . PROVENTIL HFA 108 (90 Base) MCG/ACT inhaler INHALE 2 PUFFS BY MOUTH EVERY 4 TO 6 HOURS AS NEEDED FOR SHORTNESS OF BREATH OR WHEEZING 6.7 g 0  . umeclidinium bromide (INCRUSE ELLIPTA) 62.5 MCG/INH AEPB Inhale 1 puff into the lungs daily. 1 each 6  . VESICARE 5 MG tablet TAKE 1 TABLET BY MOUTH DAILY 30 tablet 0   No  current facility-administered medications for this visit.     Allergies  Allergen Reactions  . Tetracycline Shortness Of Breath  . Tetracyclines & Related Anaphylaxis and Shortness Of Breath    dizziness  . Bactrim [Sulfamethoxazole-Trimethoprim] Other (See Comments)    dizzy dizzy dizzy  . Latex Dermatitis    Bandaids  Bandages, pulls skin  . Other Dermatitis    Bandaids   . Amlodipine     Edema  . Amoxicillin     Questionable rash  . Tape Dermatitis    Bandaids  Bandaids   . Sulfa Antibiotics Rash      Review of Systems:  Constitutional:  No  fever, no chills, No recent illness, No unintentional weight changes  HEENT: No  headache, no vision change  Cardiac: No  chest pain, No  pressure, No palpitations  Respiratory:  +shortness of breath. No  Cough  Gastrointestinal: No  abdominal pain, No  nausea  Musculoskeletal: No new myalgia/arthralgia  Skin: No  Rash  Neurologic: No  weakness, No  vertigo, No  slurred speech/focal weakness/facial droop   Exam:  BP (!) 186/78 (BP Location: Left Arm, Patient Position: Sitting, Cuff Size: Normal)   Pulse 76   Wt 168 lb (76.2 kg)   BMI 26.31 kg/m   Constitutional: VS see above. General Appearance: alert, well-developed, well-nourished, NAD  Eyes: Normal lids and conjunctive, non-icteric sclera  Ears, Nose, Mouth, Throat: MMM, Normal external inspection ears/nares/mouth/lips/gums. TM on R obscured by cerumen.   Neck: No masses, trachea midline.   Respiratory: Normal respiratory effort. no wheeze, no rhonchi, no rales  Cardiovascular: S1/S2 normal, no murmur, no rub/gallop auscultated. RRR.   Musculoskeletal: Gait normal. No clubbing/cyanosis of digits.   Neurological: Normal balance/coordination. No tremor.   Skin: warm, dry, intact. No rash/ulcer.   Psychiatric: Normal judgment/insight. Normal mood and affect. Oriented x3.    Indication: Cerumen impaction of the ear(s) Medical necessity statement: On  physical examination, cerumen impairs clinically significant portions of the external auditory canal, and tympanic membrane. Noted obstructive, copious cerumen that cannot be removed without magnification and instrumentations requiring physician skills Consent: Discussed benefits and risks of procedure and verbal consent obtained Procedure: Patient was prepped for the procedure. Utilized an otoscope to assess and take note of the ear canal, the tympanic membrane, and the presence, amount, and placement of the cerumen. Gentle water irrigation and soft plastic curette was utilized to remove cerumen.  Post procedure examination: shows cerumen was incompletely removed. Patient tolerated procedure well. The patient is made aware that they may experience temporary vertigo, temporary hearing loss, and temporary discomfort. If these symptom last for more than 24 hours to call the clinic or proceed to the ED.   ASSESSMENT/PLAN:   Type 2 diabetes mellitus with hypoglycemia without coma, with long-term current use of insulin (HCC) - Reassured no hypoglycemia and A1c is appropriate, I am not worried about evening sugars being a little bit high.  Orthostatic lightheadedness - Plan: CBC, TSH, BASIC METABOLIC PANEL WITH GFR  Abnormal TSH - Plan: TSH  Essential hypertension, benign - coming back in 2 days to recheck ear will check BP at that time  Chronic obstructive pulmonary disease, unspecified COPD type ([place])  Impacted cerumen, right ear - incomplete removal, patient unable to tolerate procedure to completion.  RTC 2 days after softening drops    Patient Instructions  Plan:  See below for information on orthostatic hypotension. Will get some other labs to check for reasons for feeling faint.   Make sure your COPD medications have refills and contact your pharmacy if you need more medicine.  If your lightheaded/breathing problems are getting worse, we might need to consider a test in the office to  see if you need oxygen  I wouldn't worry about the sugars being a little bit higher sometimes, as long as you're not dropping too low   If ear wax persists, try Debrox over-the-counter ear drops    Orthostatic Hypotension Orthostatic hypotension is a sudden drop in blood pressure that happens when you quickly change positions, such as when you get up from a seated or lying position. Blood pressure is a measurement of how strongly, or weakly, your blood is pressing against the walls of your arteries. Arteries are blood vessels that carry blood from your heart throughout your body. When blood pressure is too low, you may not get enough blood to your brain or  to the rest of your organs. This can cause weakness, light-headedness, rapid heartbeat, and fainting. This can last for just a few seconds or for up to a few minutes. Orthostatic hypotension is usually not a serious problem. However, if it happens frequently or gets worse, it may be a sign of something more serious. What are the causes? This condition may be caused by:  Sudden changes in posture, such as standing up quickly after you have been sitting or lying down.  Blood loss.  Loss of body fluids (dehydration).  Heart problems.  Hormone (endocrine) problems.  Pregnancy.  Severe infection.  Lack of certain nutrients.  Severe allergic reactions (anaphylaxis).  Certain medicines, such as blood pressure medicine or medicines that make the body lose excess fluids (diuretics). Sometimes, this condition can be caused by not taking medicine as directed, such as taking too much of a certain medicine.  What increases the risk? Certain factors can make you more likely to develop orthostatic hypotension, including:  Age. Risk increases as you get older.  Conditions that affect the heart or the central nervous system.  Taking certain medicines, such as blood pressure medicine or diuretics.  Being pregnant.  What are the signs or  symptoms? Symptoms of this condition may include:  Weakness.  Light-headedness.  Dizziness.  Blurred vision.  Fatigue.  Rapid heartbeat.  Fainting, in severe cases.  How is this diagnosed? This condition is diagnosed based on:  Your medical history.  Your symptoms.  Your blood pressure measurement. Your health care provider will check your blood pressure when you are: ? Lying down. ? Sitting. ? Standing.  A blood pressure reading is recorded as two numbers, such as "120 over 80" (or 120/80). The first ("top") number is called the systolic pressure. It is a measure of the pressure in your arteries as your heart beats. The second ("bottom") number is called the diastolic pressure. It is a measure of the pressure in your arteries when your heart relaxes between beats. Blood pressure is measured in a unit called mm Hg. Healthy blood pressure for adults is 120/80. If your blood pressure is below 90/60, you may be diagnosed with hypotension. Other information or tests that may be used to diagnose orthostatic hypotension include:  Your other vital signs, such as your heart rate and temperature.  Blood tests.  Tilt table test. For this test, you will be safely secured to a table that moves you from a lying position to an upright position. Your heart rhythm and blood pressure will be monitored during the test.  How is this treated? Treatment for this condition may include:  Changing your diet. This may involve eating more salt (sodium) or drinking more water.  Taking medicines to raise your blood pressure.  Changing the dosage of certain medicines you are taking that might be lowering your blood pressure.  Wearing compression stockings. These stockings help to prevent blood clots and reduce swelling in your legs.  In some cases, you may need to go to the hospital for:  Fluid replacement. This means you will receive fluids through an IV tube.  Blood replacement. This means  you will receive donated blood through an IV tube (transfusion).  Treating an infection or heart problems, if this applies.  Monitoring. You may need to be monitored while medicines that you are taking wear off.  Follow these instructions at home: Eating and drinking   Drink enough fluid to keep your urine clear or pale yellow.  Eat a  healthy diet and follow instructions from your health care provider about eating or drinking restrictions. A healthy diet includes: ? Fresh fruits and vegetables. ? Whole grains. ? Lean meats. ? Low-fat dairy products.  Eat extra salt only as directed. Do not add extra salt to your diet unless your health care provider told you to do that.  Eat frequent, small meals.  Avoid standing up suddenly after eating. Medicines  Take over-the-counter and prescription medicines only as told by your health care provider. ? Follow instructions from your health care provider about changing the dosage of your current medicines, if this applies. ? Do not stop or adjust any of your medicines on your own. General instructions  Wear compression stockings as told by your health care provider.  Get up slowly from lying down or sitting positions. This gives your blood pressure a chance to adjust.  Avoid hot showers and excessive heat as directed by your health care provider.  Return to your normal activities as told by your health care provider. Ask your health care provider what activities are safe for you.  Do not use any products that contain nicotine or tobacco, such as cigarettes and e-cigarettes. If you need help quitting, ask your health care provider.  Keep all follow-up visits as told by your health care provider. This is important. Contact a health care provider if:  You vomit.  You have diarrhea.  You have a fever for more than 2-3 days.  You feel more thirsty than usual.  You feel weak and tired. Get help right away if:  You have chest  pain.  You have a fast or irregular heartbeat.  You develop numbness in any part of your body.  You cannot move your arms or your legs.  You have trouble speaking.  You become sweaty or feel lightheaded.  You faint.  You feel short of breath.  You have trouble staying awake.  You feel confused. This information is not intended to replace advice given to you by your health care provider. Make sure you discuss any questions you have with your health care provider. Document Released: 08/20/2002 Document Revised: 05/18/2016 Document Reviewed: 02/20/2016 Elsevier Interactive Patient Education  2018 [employer].     Visit summary with medication list and pertinent instructions was printed for patient to review. All questions at time of visit were answered - patient instructed to contact office with any additional concerns. ER/RTC precautions were reviewed with the patient.   Follow-up plan: Return in about 2 months (around 10/17/2017) for recheck diabetes, sooner if needed.  Note: Total time spent 25 minutes, greater than 50% of the visit was spent face-to-face counseling and coordinating care for the following: The primary encounter diagnosis was Type 2 diabetes mellitus with hypoglycemia without coma, with long-term current use of insulin ([place]). Diagnoses of Orthostatic lightheadedness, Abnormal TSH, Essential hypertension, benign, Chronic obstructive pulmonary disease, unspecified COPD type ([place]), and Impacted cerumen, right ear were also pertinent to this visit.Marland Kitchen  Please note: voice recognition software was used to produce this document, and typos may escape review. Please contact Dr. Sheppard Coil for any needed clarifications.

## 2017-08-16 NOTE — Patient Instructions (Addendum)
Plan:  See below for information on orthostatic hypotension. Will get some other labs to check for reasons for feeling faint.   Make sure your COPD medications have refills and contact your pharmacy if you need more medicine.  If your lightheaded/breathing problems are getting worse, we might need to consider a test in the office to see if you need oxygen  I wouldn't worry about the sugars being a little bit higher sometimes, as long as you're not dropping too low   If ear wax persists, try Debrox over-the-counter ear drops    Orthostatic Hypotension Orthostatic hypotension is a sudden drop in blood pressure that happens when you quickly change positions, such as when you get up from a seated or lying position. Blood pressure is a measurement of how strongly, or weakly, your blood is pressing against the walls of your arteries. Arteries are blood vessels that carry blood from your heart throughout your body. When blood pressure is too low, you may not get enough blood to your brain or to the rest of your organs. This can cause weakness, light-headedness, rapid heartbeat, and fainting. This can last for just a few seconds or for up to a few minutes. Orthostatic hypotension is usually not a serious problem. However, if it happens frequently or gets worse, it may be a sign of something more serious. What are the causes? This condition may be caused by:  Sudden changes in posture, such as standing up quickly after you have been sitting or lying down.  Blood loss.  Loss of body fluids (dehydration).  Heart problems.  Hormone (endocrine) problems.  Pregnancy.  Severe infection.  Lack of certain nutrients.  Severe allergic reactions (anaphylaxis).  Certain medicines, such as blood pressure medicine or medicines that make the body lose excess fluids (diuretics). Sometimes, this condition can be caused by not taking medicine as directed, such as taking too much of a certain  medicine.  What increases the risk? Certain factors can make you more likely to develop orthostatic hypotension, including:  Age. Risk increases as you get older.  Conditions that affect the heart or the central nervous system.  Taking certain medicines, such as blood pressure medicine or diuretics.  Being pregnant.  What are the signs or symptoms? Symptoms of this condition may include:  Weakness.  Light-headedness.  Dizziness.  Blurred vision.  Fatigue.  Rapid heartbeat.  Fainting, in severe cases.  How is this diagnosed? This condition is diagnosed based on:  Your medical history.  Your symptoms.  Your blood pressure measurement. Your health care provider will check your blood pressure when you are: ? Lying down. ? Sitting. ? Standing.  A blood pressure reading is recorded as two numbers, such as "120 over 80" (or 120/80). The first ("top") number is called the systolic pressure. It is a measure of the pressure in your arteries as your heart beats. The second ("bottom") number is called the diastolic pressure. It is a measure of the pressure in your arteries when your heart relaxes between beats. Blood pressure is measured in a unit called mm Hg. Healthy blood pressure for adults is 120/80. If your blood pressure is below 90/60, you may be diagnosed with hypotension. Other information or tests that may be used to diagnose orthostatic hypotension include:  Your other vital signs, such as your heart rate and temperature.  Blood tests.  Tilt table test. For this test, you will be safely secured to a table that moves you from a lying position  to an upright position. Your heart rhythm and blood pressure will be monitored during the test.  How is this treated? Treatment for this condition may include:  Changing your diet. This may involve eating more salt (sodium) or drinking more water.  Taking medicines to raise your blood pressure.  Changing the dosage of  certain medicines you are taking that might be lowering your blood pressure.  Wearing compression stockings. These stockings help to prevent blood clots and reduce swelling in your legs.  In some cases, you may need to go to the hospital for:  Fluid replacement. This means you will receive fluids through an IV tube.  Blood replacement. This means you will receive donated blood through an IV tube (transfusion).  Treating an infection or heart problems, if this applies.  Monitoring. You may need to be monitored while medicines that you are taking wear off.  Follow these instructions at home: Eating and drinking   Drink enough fluid to keep your urine clear or pale yellow.  Eat a healthy diet and follow instructions from your health care provider about eating or drinking restrictions. A healthy diet includes: ? Fresh fruits and vegetables. ? Whole grains. ? Lean meats. ? Low-fat dairy products.  Eat extra salt only as directed. Do not add extra salt to your diet unless your health care provider told you to do that.  Eat frequent, small meals.  Avoid standing up suddenly after eating. Medicines  Take over-the-counter and prescription medicines only as told by your health care provider. ? Follow instructions from your health care provider about changing the dosage of your current medicines, if this applies. ? Do not stop or adjust any of your medicines on your own. General instructions  Wear compression stockings as told by your health care provider.  Get up slowly from lying down or sitting positions. This gives your blood pressure a chance to adjust.  Avoid hot showers and excessive heat as directed by your health care provider.  Return to your normal activities as told by your health care provider. Ask your health care provider what activities are safe for you.  Do not use any products that contain nicotine or tobacco, such as cigarettes and e-cigarettes. If you need help  quitting, ask your health care provider.  Keep all follow-up visits as told by your health care provider. This is important. Contact a health care provider if:  You vomit.  You have diarrhea.  You have a fever for more than 2-3 days.  You feel more thirsty than usual.  You feel weak and tired. Get help right away if:  You have chest pain.  You have a fast or irregular heartbeat.  You develop numbness in any part of your body.  You cannot move your arms or your legs.  You have trouble speaking.  You become sweaty or feel lightheaded.  You faint.  You feel short of breath.  You have trouble staying awake.  You feel confused. This information is not intended to replace advice given to you by your health care provider. Make sure you discuss any questions you have with your health care provider. Document Released: 08/20/2002 Document Revised: 05/18/2016 Document Reviewed: 02/20/2016 Elsevier Interactive Patient Education  2018 [employer].

## 2017-08-17 ENCOUNTER — Other Ambulatory Visit: Payer: Self-pay | Admitting: Osteopathic Medicine

## 2017-08-18 ENCOUNTER — Other Ambulatory Visit: Payer: Self-pay

## 2017-08-18 ENCOUNTER — Ambulatory Visit (INDEPENDENT_AMBULATORY_CARE_PROVIDER_SITE_OTHER): Payer: Medicare Other | Admitting: Osteopathic Medicine

## 2017-08-18 ENCOUNTER — Encounter: Payer: Self-pay | Admitting: Osteopathic Medicine

## 2017-08-18 VITALS — BP 138/68 | HR 73

## 2017-08-18 DIAGNOSIS — H6121 Impacted cerumen, right ear: Secondary | ICD-10-CM | POA: Diagnosis not present

## 2017-08-18 DIAGNOSIS — I2510 Atherosclerotic heart disease of native coronary artery without angina pectoris: Secondary | ICD-10-CM

## 2017-08-18 DIAGNOSIS — I2583 Coronary atherosclerosis due to lipid rich plaque: Secondary | ICD-10-CM

## 2017-08-18 NOTE — Progress Notes (Signed)
HPI: Briana Werner is a 81 y.o. female who  has a past medical history of Asthma, Atrial flutter ([place]), CAD (coronary artery disease), Cerebrovascular disease, Chronic renal insufficiency (02/26/2014), COPD (chronic obstructive pulmonary disease) ([place]) (02/26/2014), Hypothyroid (02/26/2014), Presence of stent in coronary artery in patient with coronary artery disease (02/26/2014), Renal artery stenosis ([place]), and Type 2 diabetes mellitus ([place]) (02/26/2014).  she presents to [facility] today, 08/18/17,  for chief complaint of:  Chief Complaint  Patient presents with  . Cerumen Impaction     Following up today for cerumen impaction. Patient was unable to tolerate removal procedure 2 days ago. She was sent home with instructions to use Debrox or mineral oil few times a day to help soften the wax. Returns today for further evaluation, still cannot hear anything out of her right ear.   Past medical history, surgical history, social history and family history reviewed.   Current medication list and allergy/intolerance information reviewed.       Review of Systems:  Constitutional: No recent illness  HEENT: +cerumen impaction as per HPI    Exam:  BP 138/68   Pulse 73   Constitutional: VS see above. General Appearance: alert, well-developed, well-nourished, NAD  HEENT: cerumen removed, TM appears a bit erythematous at the edges but clear, no bulging or dullness   Psychiatric: Normal judgment/insight. Normal mood and affect. Oriented x3.     Indication: Cerumen impaction of the ear(s) Medical necessity statement: On physical examination, cerumen impairs clinically significant portions of the external auditory canal, and tympanic membrane. Noted obstructive, copious cerumen that cannot be removed without magnification and instrumentations requiring physician skills Consent: Discussed benefits and risks of procedure and verbal consent obtained Procedure:  Patient was prepped for the procedure. Utilized an otoscope to assess and take note of the ear canal, the tympanic membrane, and the presence, amount, and placement of the cerumen. Gentle water irrigation and soft plastic curette was utilized to remove cerumen.  Post procedure examination: shows cerumen was completely removed. Patient tolerated procedure well. The patient is made aware that they may experience temporary vertigo, temporary hearing loss, and temporary discomfort. If these symptom last for more than 24 hours to call the clinic or proceed to the ED.      ASSESSMENT/PLAN:   Impacted cerumen, right ear      Patient Instructions  No more Q-tips!     Follow-up plan: Return if symptoms worsen or fail to improve.  Visit summary with medication list and pertinent instructions was printed for patient to review, alert [place] if any changes needed. All questions at time of visit were answered - patient instructed to contact office with any additional concerns. ER/RTC precautions were reviewed with the patient and understanding verbalized.     Please note: voice recognition software was used to produce this document, and typos may escape review. Please contact Dr. Sheppard Coil for any needed clarifications.

## 2017-08-18 NOTE — Patient Instructions (Signed)
-   No more Q-tips

## 2017-08-19 ENCOUNTER — Encounter: Payer: Self-pay | Admitting: Osteopathic Medicine

## 2017-08-30 ENCOUNTER — Other Ambulatory Visit: Payer: Self-pay | Admitting: Osteopathic Medicine

## 2017-08-30 ENCOUNTER — Other Ambulatory Visit: Payer: Self-pay | Admitting: Family Medicine

## 2017-09-19 ENCOUNTER — Other Ambulatory Visit: Payer: Self-pay | Admitting: Osteopathic Medicine

## 2017-09-20 NOTE — Telephone Encounter (Signed)
Sent!

## 2017-09-20 NOTE — Telephone Encounter (Signed)
Pt notified of medication refill sent to her pharmacy on file. No other inquiries during phone call.

## 2017-09-20 NOTE — Telephone Encounter (Signed)
Left vm msg with call back number for pt in regards to medication refill.

## 2017-09-22 ENCOUNTER — Other Ambulatory Visit: Payer: Self-pay | Admitting: Osteopathic Medicine

## 2017-09-28 ENCOUNTER — Ambulatory Visit (INDEPENDENT_AMBULATORY_CARE_PROVIDER_SITE_OTHER): Payer: Medicare Other | Admitting: Osteopathic Medicine

## 2017-09-28 ENCOUNTER — Encounter: Payer: Self-pay | Admitting: Osteopathic Medicine

## 2017-09-28 VITALS — BP 153/75 | HR 51 | Wt 165.0 lb

## 2017-09-28 DIAGNOSIS — I482 Chronic atrial fibrillation, unspecified: Secondary | ICD-10-CM

## 2017-09-28 DIAGNOSIS — I2583 Coronary atherosclerosis due to lipid rich plaque: Secondary | ICD-10-CM | POA: Diagnosis not present

## 2017-09-28 DIAGNOSIS — R002 Palpitations: Secondary | ICD-10-CM

## 2017-09-28 DIAGNOSIS — I2510 Atherosclerotic heart disease of native coronary artery without angina pectoris: Secondary | ICD-10-CM | POA: Diagnosis not present

## 2017-09-28 MED ORDER — CARVEDILOL 12.5 MG PO TABS: 25.0000 mg | ORAL_TABLET | Freq: Two times a day (BID) | ORAL | 3 refills | Status: DC

## 2017-09-28 NOTE — Patient Instructions (Signed)
Plan:  I'll try to contact Dr Stanford Breed when he is here today (assuming he is here!) or will send him a message  For now, we need some blood work and urine sample  Let's increase your Coreg to 2 tablets at a time, twice per day

## 2017-09-28 NOTE — Progress Notes (Signed)
HPI: Briana Werner is a 82 y.o. female who  has a past medical history of Asthma, Atrial flutter ([place]), CAD (coronary artery disease), Cerebrovascular disease, Chronic renal insufficiency (02/26/2014), COPD (chronic obstructive pulmonary disease) ([place]) (02/26/2014), Hypothyroid (02/26/2014), Presence of stent in coronary artery in patient with coronary artery disease (02/26/2014), Renal artery stenosis ([place]), and Type 2 diabetes mellitus ([place]) (02/26/2014).  she presents to [facility] today, 09/28/17,  for chief complaint of:  Chief Complaint  Patient presents with  . Palpitations    Palpitations x2 weeks in pt w/ Hx Afib, coming and gong, last episode was this morning 6 am lasted for about an hour. He's been having similar episodes lasting anywhere from a few minutes to an hour or more that resolved spontaneously. During these episodes she feels like heart is beating really quickly/pounding. Some shortness of breath and fatigue associated with these. Otherwise, no claudication or chest pain, no lower extremity swelling. Compliant with medications as below. No falls or loss of consciousness. Patient is not concerned about any infection such as symptoms of flu, UTI, cough, diarrhea.    Past medical, surgical, social and family history reviewed:  Patient Active Problem List   Diagnosis Date Noted  . CKD (chronic kidney disease) stage 4, GFR 15-29 ml/min (HCC) 02/25/2017  . Nasal polyposis 12/07/2016  . Cough 07/13/2016  . Tinea pedis 05/25/2016  . Diabetic foot ulcer ([place]) 05/25/2016  . Left hip pain 04/15/2016  . Lumbar radicular pain 01/15/2016  . Aortic atherosclerosis ([place]) 01/12/2016  . Anemia 02/16/2015  . Atrial fibrillation ([place]) 12/25/2014  . Diabetes mellitus with kidney complication ([place]) 23/53/6144  . Spondylisthesis 05/31/2014  . CAD (coronary artery disease) 05/01/2014  . Renal artery stenosis ([place]) 05/01/2014  . Generalized anxiety disorder  04/03/2014  . CKD (chronic kidney disease) stage 3, GFR 30-59 ml/min (HCC) 03/26/2014  . Chronic atrial flutter ([place]) 03/19/2014  . Carotid artery stenosis 03/19/2014  . Hypercalcemia 02/27/2014  . Essential hypertension, benign 02/26/2014  . Hypothyroid 02/26/2014  . Hyperlipidemia 02/26/2014  . COPD (chronic obstructive pulmonary disease) ([place]) 02/26/2014  . Dementia 02/26/2014  . Presence of stent in coronary artery in patient with coronary artery disease 02/26/2014  . Nephrolithiasis 02/26/2014  . Diabetic peripheral neuropathy ([place]) 02/26/2014  . DVT, lower extremity, recurrent ([place]) 02/26/2014  . Status post left hip replacement 02/26/2014  . Overactive bladder 02/26/2014    Past Surgical History:  Procedure Laterality Date  . ABDOMINAL HYSTERECTOMY    . APPENDECTOMY    . BACK SURGERY    . CHOLECYSTECTOMY    . CORONARY ARTERY BYPASS GRAFT     2004, [place]  . Hip replacement    . KNEE SURGERY Bilateral   . NASAL SINUS SURGERY    . SHOULDER SURGERY    . TONSILLECTOMY      Social History   Tobacco Use  . Smoking status: Never Smoker  . Smokeless tobacco: Never Used  Substance Use Topics  . Alcohol use: No    Family History  Problem Relation Age of Onset  . CAD Father   . Heart attack Father      Current medication list and allergy/intolerance information reviewed:    Current Outpatient Medications  Medication Sig Dispense Refill  . AMBULATORY NON FORMULARY MEDICATION Insulin needles for lantus solastar  31g x4 100 each 11  . AMBULATORY NON FORMULARY MEDICATION Diabetic Test Strips and Lancets: One Touch Ultra Blue  Dx Type 2 Diabetes.e11.22  Use to  check blood sugar twice a day. 100 each PRN  . Calcium Carbonate-Vitamin D (CALTRATE 600+D PO) Take by mouth 2 (two) times daily.    . carvedilol (COREG) 12.5 MG tablet Take 1 tablet (12.5 mg total) by mouth 2 (two) times daily with a meal. 180 tablet 3  . ELIQUIS 5 MG TABS tablet TAKE 1 TABLET(5 MG) BY MOUTH  TWICE DAILY 60 tablet 0  . escitalopram (LEXAPRO) 5 MG tablet TAKE 1 TABLET BY MOUTH DAILY 90 tablet 0  . FeFum-FePoly-FA-B Cmp-C-Biot (INTEGRA PLUS) CAPS TAKE 1 CAPSULE BY MOUTH DAILY 90 capsule 0  . Fluticasone-Salmeterol (ADVAIR DISKUS) 500-50 MCG/DOSE AEPB Inhale 1 puff into the lungs 2 (two) times daily. 60 each 3  . gabapentin (NEURONTIN) 300 MG capsule TAKE 1 CAPSULE(300 MG) BY MOUTH THREE TIMES DAILY 90 capsule 0  . glucose blood (ONE TOUCH ULTRA TEST) test strip Check blood sugar up to 4 times daily. Dx DM ICD 10 E11.9 insulin dependent. 200 each prn  . Insulin Glargine (TOUJEO SOLOSTAR) 300 UNIT/ML SOPN Inject 10-15 Units into the skin daily. 4.5 mL 6  . Insulin Pen Needle (BD PEN NEEDLE NANO U/F) 32G X 4 MM MISC Inject into skin once daily. Use with insulin pen. DX DM ICD-10 E11.22 100 each prn  . ipratropium-albuterol (DUONEB) 0.5-2.5 (3) MG/3ML SOLN Take 3 mLs by nebulization every 2 (two) hours as needed (wheeze, SOB, cough). 60 mL 1  . IRON PO Take by mouth.    Marland Kitchen JANUVIA 50 MG tablet TAKE 1 TABLET BY MOUTH EVERY DAY 90 tablet 0  . levothyroxine (SYNTHROID, LEVOTHROID) 88 MCG tablet TAKE 1 TABLET BY MOUTH DAILY BEFORE BREAKFAST 90 tablet 1  . montelukast (SINGULAIR) 10 MG tablet TAKE 1 TABLET BY MOUTH AT BEDTIME 30 tablet 0  . Multiple Vitamin (MULTIVITAMIN) capsule Take 1 capsule by mouth.    Marland Kitchen omeprazole (PRILOSEC) 20 MG capsule TAKE 1 CAPSULE BY MOUTH TWICE DAILY 60 capsule 2  . oxyCODONE-acetaminophen (ROXICET) 5-325 MG tablet Take 1 tablet by mouth 2 (two) times daily as needed for severe pain. 30 tablet 0  . pantoprazole (PROTONIX) 40 MG tablet TAKE 1 TABLET BY MOUTH DAILY 90 tablet 0  . PROVENTIL HFA 108 (90 Base) MCG/ACT inhaler INHALE 2 PUFFS BY MOUTH EVERY 4 TO 6 HOURS AS NEEDED FOR SHORTNESS OF BREATH OR WHEEZING 6.7 g 0  . umeclidinium bromide (INCRUSE ELLIPTA) 62.5 MCG/INH AEPB Inhale 1 puff into the lungs daily. 1 each 6  . VESICARE 5 MG tablet TAKE 1 TABLET BY MOUTH  DAILY 30 tablet 0   No current facility-administered medications for this visit.     Allergies  Allergen Reactions  . Tetracycline Shortness Of Breath  . Tetracyclines & Related Anaphylaxis and Shortness Of Breath    dizziness  . Bactrim [Sulfamethoxazole-Trimethoprim] Other (See Comments)    dizzy dizzy dizzy  . Latex Dermatitis    Bandaids  Bandages, pulls skin  . Other Dermatitis    Bandaids   . Amlodipine     Edema  . Amoxicillin     Questionable rash  . Tape Dermatitis    Bandaids  Bandaids   . Sulfa Antibiotics Rash      Review of Systems:  Constitutional:  No  fever, no chills, No recent illness, No unintentional weight changes. No significant fatigue.   HEENT: No  headache, no vision change, no hearing change, No sore throat, No  sinus pressure  Cardiac: No  chest pain, No  pressure, +palpitations,  No  Orthopnea  Respiratory:  No  shortness of breath. No  Cough  Gastrointestinal: No  abdominal pain, No  nausea, No  vomiting,  No  blood in stool, No  diarrhea, No  constipation   Musculoskeletal: No new myalgia/arthralgia  Genitourinary: No  incontinence, no dysuria/hematuria, No  abnormal genital bleeding, No abnormal genital discharge  Skin: No  Rash,  Neurologic: + Generalized weakness, No  dizziness, No  slurred speech/focal weakness/facial droop  Psychiatric: No  concerns with depression, No  concerns with anxiety  Exam:  BP (!) 169/69   Pulse 96   Wt 165 lb (74.8 kg)   LMP  (LMP Unknown)   BMI 25.84 kg/m  blood pressure on recheck systolic 017B.  Constitutional: VS see above. General Appearance: alert, well-developed, well-nourished, NAD  Eyes: Normal lids and conjunctive, non-icteric sclera  Ears, Nose, Mouth, Throat: MMM, Normal external inspection ears/nares/mouth/lips/gums. \  Neck: No masses, trachea midline\  Respiratory: Normal respiratory effort. no wheeze, no rhonchi, no rales  Cardiovascular: S1/S2 normal, no murmur, no  rub/gallop auscultated. Irregularly irregular rhythm. No lower extremity edema. Pedal pulse II/IV bilaterally DP and PT. No carotid bruit or JVD. No abdominal aortic bruit.  Gastrointestinal: Nontender, no masses. No hepatomegaly, no splenomegaly.   Musculoskeletal: Gait normal. Walks with assistance of a walker  Neurological: Normal balance/coordination. No tremor. No cranial nerve deficit on limited exam. Motor and sensation intact and symmetric except she has some difficulty flexing left hip against resistance due to pain. Cerebellar reflexes intact.   Skin: warm, dry, intact. No rash/ulcer. \  Psychiatric: Normal judgment/insight. Normal mood and affect. Oriented x3.    EKG interpretation: Rate: 90 Rhythm: aflutter/afib No ST/T changes concerning for acute ischemia/infarct  Previous EKG 04/13/17 w/ Dr Stanford Breed consistent w/ today's        ASSESSMENT/PLAN: Sound like she is probably having runs of tachycardia/RVR. We'll go ahead and get preliminary labs, we'll see if we can reach out to cardiologist, may require Holter monitor or other workup such as echocardiogram/stress testing. She is asymptomatic at this point though she notes more fatigue than usual.  Palpitations - Plan: EKG 12-Lead, CBC, COMPLETE METABOLIC PANEL WITH GFR, Magnesium, TSH, Urinalysis  Chronic atrial fibrillation (HCC)  Coronary artery disease due to lipid rich plaque   Meds ordered this encounter  Medications  . carvedilol (COREG) 12.5 MG tablet    Sig: Take 2 tablets (25 mg total) by mouth 2 (two) times daily with a meal.    Dispense:  180 tablet    Refill:  3     Patient Instructions  Plan:  I'll try to contact Dr Stanford Breed when he is here today (assuming he is here!) or will send him a message  For now, we need some blood work and urine sample  Let's increase your Coreg to 2 tablets at a time, twice per day     Visit summary with medication list and pertinent instructions was printed for  patient to review. All questions at time of visit were answered - patient instructed to contact office with any additional concerns. ER/RTC precautions were reviewed with the patient.   Follow-up plan: Return for recheck depending on symptoms and lab results - await any other recommendations from Dr Stanford Breed . Come see me in another 2-3 days to recheck blood pressure unless she is able to get in to see cardiology  Note: Total time spent 25 minutes, greater than 50% of the visit was spent face-to-face counseling and coordinating  care for the following: The primary encounter diagnosis was Palpitations. Diagnoses of Chronic atrial fibrillation (HCC) and Coronary artery disease due to lipid rich plaque were also pertinent to this visit.Marland Kitchen  Please note: voice recognition software was used to produce this document, and typos may escape review. Please contact Dr. Sheppard Coil for any needed clarifications.

## 2017-09-28 NOTE — Progress Notes (Signed)
We will arrange [place]

## 2017-09-29 ENCOUNTER — Telehealth: Payer: Self-pay

## 2017-09-29 ENCOUNTER — Ambulatory Visit (INDEPENDENT_AMBULATORY_CARE_PROVIDER_SITE_OTHER): Payer: Medicare Other

## 2017-09-29 ENCOUNTER — Other Ambulatory Visit: Payer: Self-pay | Admitting: Osteopathic Medicine

## 2017-09-29 DIAGNOSIS — R8290 Unspecified abnormal findings in urine: Secondary | ICD-10-CM

## 2017-09-29 DIAGNOSIS — D72829 Elevated white blood cell count, unspecified: Secondary | ICD-10-CM

## 2017-09-29 DIAGNOSIS — R918 Other nonspecific abnormal finding of lung field: Secondary | ICD-10-CM | POA: Diagnosis not present

## 2017-09-29 DIAGNOSIS — I700 Atherosclerosis of aorta: Secondary | ICD-10-CM | POA: Diagnosis not present

## 2017-09-29 LAB — CBC WITH DIFFERENTIAL/PLATELET
BASOS PCT: 0.8 %
Basophils Absolute: 105 cells/uL (ref 0–200)
EOS PCT: 24.6 %
Eosinophils Absolute: 3223 cells/uL — ABNORMAL HIGH (ref 15–500)
HEMATOCRIT: 33.6 % — AB (ref 35.0–45.0)
Hemoglobin: 11 g/dL — ABNORMAL LOW (ref 11.7–15.5)
LYMPHS ABS: 1795 {cells}/uL (ref 850–3900)
MCH: 28.4 pg (ref 27.0–33.0)
MCHC: 32.7 g/dL (ref 32.0–36.0)
MCV: 86.8 fL (ref 80.0–100.0)
MPV: 10.8 fL (ref 7.5–12.5)
Monocytes Relative: 5.9 %
NEUTROS PCT: 55 %
Neutro Abs: 7205 cells/uL (ref 1500–7800)
PLATELETS: 246 10*3/uL (ref 140–400)
RBC: 3.87 10*6/uL (ref 3.80–5.10)
RDW: 14.1 % (ref 11.0–15.0)
TOTAL LYMPHOCYTE: 13.7 %
WBC: 13.1 10*3/uL — AB (ref 3.8–10.8)
WBCMIX: 773 {cells}/uL (ref 200–950)

## 2017-09-29 LAB — URINALYSIS
BILIRUBIN URINE: NEGATIVE
Glucose, UA: NEGATIVE
Ketones, ur: NEGATIVE
NITRITE: NEGATIVE
Specific Gravity, Urine: 1.017 (ref 1.001–1.03)
pH: 6 (ref 5.0–8.0)

## 2017-09-29 LAB — COMPLETE METABOLIC PANEL WITH GFR
AG Ratio: 1.2 (calc) (ref 1.0–2.5)
ALKALINE PHOSPHATASE (APISO): 93 U/L (ref 33–130)
ALT: 10 U/L (ref 6–29)
AST: 24 U/L (ref 10–35)
Albumin: 3.9 g/dL (ref 3.6–5.1)
BUN/Creatinine Ratio: 24 (calc) — ABNORMAL HIGH (ref 6–22)
BUN: 35 mg/dL — AB (ref 7–25)
CO2: 25 mmol/L (ref 20–32)
Calcium: 9.3 mg/dL (ref 8.6–10.4)
Chloride: 105 mmol/L (ref 98–110)
Creat: 1.43 mg/dL — ABNORMAL HIGH (ref 0.60–0.93)
GFR, Est African American: 40 mL/min/{1.73_m2} — ABNORMAL LOW (ref >=60)
GFR, Est Non African American: 35 mL/min/{1.73_m2} — ABNORMAL LOW (ref >=60)
GLUCOSE: 210 mg/dL — AB (ref 65–99)
Globulin: 3.2 g/dL (calc) (ref 1.9–3.7)
Potassium: 4.8 mmol/L (ref 3.5–5.3)
Sodium: 140 mmol/L (ref 135–146)
Total Bilirubin: 1.1 mg/dL (ref 0.2–1.2)
Total Protein: 7.1 g/dL (ref 6.1–8.1)

## 2017-09-29 LAB — MAGNESIUM: MAGNESIUM: 1.3 mg/dL — AB (ref 1.5–2.5)

## 2017-09-29 LAB — CBC
HCT: 33.6 % — ABNORMAL LOW (ref 35.0–45.0)
Hemoglobin: 11 g/dL — ABNORMAL LOW (ref 11.7–15.5)
MCH: 28.4 pg (ref 27.0–33.0)
MCHC: 32.7 g/dL (ref 32.0–36.0)
MCV: 86.8 fL (ref 80.0–100.0)
MPV: 10.8 fL (ref 7.5–12.5)
PLATELETS: 246 10*3/uL (ref 140–400)
RBC: 3.87 10*6/uL (ref 3.80–5.10)
RDW: 14.1 % (ref 11.0–15.0)
WBC: 13.1 10*3/uL — ABNORMAL HIGH (ref 3.8–10.8)

## 2017-09-29 LAB — TSH: TSH: 4.87 m[IU]/L — AB (ref 0.40–4.50)

## 2017-09-29 MED ORDER — LEVOFLOXACIN 250 MG PO TABS: 250.0000 mg | ORAL_TABLET | Freq: Every day | ORAL | 0 refills | Status: DC

## 2017-09-29 MED ORDER — LEVOFLOXACIN 500 MG PO TABS: 500.0000 mg | ORAL_TABLET | Freq: Every day | ORAL | 0 refills | Status: DC

## 2017-09-29 NOTE — Telephone Encounter (Signed)
PT left new urine sample in the bathroom in the hall. She put it in the silver door.

## 2017-09-29 NOTE — Progress Notes (Signed)
Adding on

## 2017-09-29 NOTE — Progress Notes (Unsigned)
CAP

## 2017-09-29 NOTE — Telephone Encounter (Signed)
Urine specimen labeled and placed in lab pick up basket.

## 2017-10-02 DIAGNOSIS — I517 Cardiomegaly: Secondary | ICD-10-CM | POA: Diagnosis not present

## 2017-10-02 DIAGNOSIS — J449 Chronic obstructive pulmonary disease, unspecified: Secondary | ICD-10-CM | POA: Diagnosis not present

## 2017-10-02 DIAGNOSIS — R9431 Abnormal electrocardiogram [ECG] [EKG]: Secondary | ICD-10-CM | POA: Diagnosis not present

## 2017-10-02 DIAGNOSIS — N183 Chronic kidney disease, stage 3 (moderate): Secondary | ICD-10-CM | POA: Diagnosis not present

## 2017-10-02 DIAGNOSIS — Z794 Long term (current) use of insulin: Secondary | ICD-10-CM | POA: Diagnosis not present

## 2017-10-02 DIAGNOSIS — Z79899 Other long term (current) drug therapy: Secondary | ICD-10-CM | POA: Diagnosis not present

## 2017-10-02 DIAGNOSIS — Z7901 Long term (current) use of anticoagulants: Secondary | ICD-10-CM | POA: Diagnosis not present

## 2017-10-02 DIAGNOSIS — Z951 Presence of aortocoronary bypass graft: Secondary | ICD-10-CM | POA: Diagnosis not present

## 2017-10-02 DIAGNOSIS — I4891 Unspecified atrial fibrillation: Secondary | ICD-10-CM | POA: Diagnosis not present

## 2017-10-02 DIAGNOSIS — R079 Chest pain, unspecified: Secondary | ICD-10-CM | POA: Diagnosis not present

## 2017-10-02 DIAGNOSIS — G309 Alzheimer's disease, unspecified: Secondary | ICD-10-CM | POA: Diagnosis not present

## 2017-10-02 DIAGNOSIS — I25810 Atherosclerosis of coronary artery bypass graft(s) without angina pectoris: Secondary | ICD-10-CM | POA: Diagnosis not present

## 2017-10-02 DIAGNOSIS — I130 Hypertensive heart and chronic kidney disease with heart failure and stage 1 through stage 4 chronic kidney disease, or unspecified chronic kidney disease: Secondary | ICD-10-CM | POA: Diagnosis not present

## 2017-10-02 DIAGNOSIS — F0280 Dementia in other diseases classified elsewhere without behavioral disturbance: Secondary | ICD-10-CM | POA: Diagnosis not present

## 2017-10-02 DIAGNOSIS — G301 Alzheimer's disease with late onset: Secondary | ICD-10-CM | POA: Diagnosis not present

## 2017-10-02 DIAGNOSIS — I129 Hypertensive chronic kidney disease with stage 1 through stage 4 chronic kidney disease, or unspecified chronic kidney disease: Secondary | ICD-10-CM | POA: Diagnosis not present

## 2017-10-02 DIAGNOSIS — E1122 Type 2 diabetes mellitus with diabetic chronic kidney disease: Secondary | ICD-10-CM | POA: Diagnosis not present

## 2017-10-02 DIAGNOSIS — R0789 Other chest pain: Secondary | ICD-10-CM | POA: Diagnosis not present

## 2017-10-02 DIAGNOSIS — E782 Mixed hyperlipidemia: Secondary | ICD-10-CM | POA: Diagnosis not present

## 2017-10-03 ENCOUNTER — Other Ambulatory Visit: Payer: Self-pay | Admitting: Osteopathic Medicine

## 2017-10-03 ENCOUNTER — Ambulatory Visit (INDEPENDENT_AMBULATORY_CARE_PROVIDER_SITE_OTHER): Payer: Medicare Other | Admitting: Osteopathic Medicine

## 2017-10-03 ENCOUNTER — Telehealth: Payer: Self-pay

## 2017-10-03 ENCOUNTER — Encounter: Payer: Self-pay | Admitting: Osteopathic Medicine

## 2017-10-03 VITALS — BP 154/73 | HR 61 | Wt 167.0 lb

## 2017-10-03 DIAGNOSIS — I2510 Atherosclerotic heart disease of native coronary artery without angina pectoris: Secondary | ICD-10-CM

## 2017-10-03 DIAGNOSIS — I482 Chronic atrial fibrillation, unspecified: Secondary | ICD-10-CM

## 2017-10-03 DIAGNOSIS — J449 Chronic obstructive pulmonary disease, unspecified: Secondary | ICD-10-CM

## 2017-10-03 DIAGNOSIS — I10 Essential (primary) hypertension: Secondary | ICD-10-CM | POA: Diagnosis not present

## 2017-10-03 DIAGNOSIS — I2583 Coronary atherosclerosis due to lipid rich plaque: Secondary | ICD-10-CM

## 2017-10-03 DIAGNOSIS — R0789 Other chest pain: Secondary | ICD-10-CM

## 2017-10-03 MED ORDER — CARVEDILOL 12.5 MG PO TABS: 12.5000 mg | ORAL_TABLET | Freq: Two times a day (BID) | ORAL | Status: DC

## 2017-10-03 NOTE — Telephone Encounter (Signed)
Can we call Dr. Jacalyn Lefevre office to see if we can get this patient in a bit sooner? I sent him her last progress note, think she may be having some issues with tachycardia, she is currently having some intermittent chest pains, recent ER visit. He stated that his office would call her but it doesn't look like this has happened. If she can't get in to see him in the [place] office, I think she would be okay to go to [employer] or [place]

## 2017-10-03 NOTE — Progress Notes (Signed)
HPI: Briana Werner is a 82 y.o. female who  has a past medical history of Asthma, Atrial flutter ([place]), CAD (coronary artery disease), Cerebrovascular disease, Chronic renal insufficiency (02/26/2014), COPD (chronic obstructive pulmonary disease) ([place]) (02/26/2014), Hypothyroid (02/26/2014), Presence of stent in coronary artery in patient with coronary artery disease (02/26/2014), Renal artery stenosis ([place]), and Type 2 diabetes mellitus ([place]) (02/26/2014).  she presents to [facility] today, 10/03/17,  for chief complaint of: HFU  ER yesterday 10/02/17, d/t ongoing chest pains/palpitations, records reviewed. In ER, BP 179/93 in triage, SaO2 92%, WBC 11.5, Cr 1.39,  Neg Tropes, Neg CXR, EKG Rate 100 w/ sinus rhythm and PAC. ER gave anti-inflammatory and cautioned against over-exertion.   When I saw her last week 09/28/17, c/o intermittent palpitations, EKG in office ok. No symptoms at that time. CXR was concerning for PNA and abn UA as well so we started Levaquin at renal dosing to cover CAP and possible UTI. I routed a message to her cardiologist who stated his office would get in touch with her about an appt but it doesn't look like she has anything scheduled.   She states today that she is not having episodes of fast heartbeat, can't remember if she ever was having this. She is not taking the double dose Coreg which was avised at her last visit w/ me d/t palpitations, concern for dizziness. She now reports chest sharp pain on R or L, worse on L when lying on her L side and same for R. Holding something to her chest helps - she has a stuffed bear from husband's cardiac surgery that she holds to her chest when pain is worse. She was recently lifting and moving things cleaning up Christmas decorations and also recent trip to [place] thinks may have strained. Tylenol eases the pain as well, she didn't take the Rx form the Er Naproxen.   She has Hx COPD but hasn't been using rescue  inhaler. Doesn't really c/o SOB or cough.   On Eliquis for Afib, her description of pain really doesn't sound like PE.      Past medical, surgical, social and family history reviewed:  Patient Active Problem List   Diagnosis Date Noted  . CKD (chronic kidney disease) stage 4, GFR 15-29 ml/min (HCC) 02/25/2017  . Nasal polyposis 12/07/2016  . Cough 07/13/2016  . Tinea pedis 05/25/2016  . Diabetic foot ulcer ([place]) 05/25/2016  . Left hip pain 04/15/2016  . Lumbar radicular pain 01/15/2016  . Aortic atherosclerosis ([place]) 01/12/2016  . Anemia 02/16/2015  . Atrial fibrillation ([place]) 12/25/2014  . Diabetes mellitus with kidney complication ([place]) 83/15/1761  . Spondylisthesis 05/31/2014  . CAD (coronary artery disease) 05/01/2014  . Renal artery stenosis ([place]) 05/01/2014  . Generalized anxiety disorder 04/03/2014  . CKD (chronic kidney disease) stage 3, GFR 30-59 ml/min (HCC) 03/26/2014  . Chronic atrial flutter ([place]) 03/19/2014  . Carotid artery stenosis 03/19/2014  . Hypercalcemia 02/27/2014  . Essential hypertension, benign 02/26/2014  . Hypothyroid 02/26/2014  . Hyperlipidemia 02/26/2014  . COPD (chronic obstructive pulmonary disease) ([place]) 02/26/2014  . Dementia 02/26/2014  . Presence of stent in coronary artery in patient with coronary artery disease 02/26/2014  . Nephrolithiasis 02/26/2014  . Diabetic peripheral neuropathy ([place]) 02/26/2014  . DVT, lower extremity, recurrent ([place]) 02/26/2014  . Status post left hip replacement 02/26/2014  . Overactive bladder 02/26/2014    Past Surgical History:  Procedure Laterality Date  . ABDOMINAL HYSTERECTOMY    . APPENDECTOMY    .  BACK SURGERY    . CHOLECYSTECTOMY    . CORONARY ARTERY BYPASS GRAFT     2004, [place]  . Hip replacement    . KNEE SURGERY Bilateral   . NASAL SINUS SURGERY    . SHOULDER SURGERY    . TONSILLECTOMY      Social History   Tobacco Use  . Smoking status: Never Smoker  . Smokeless tobacco: Never  Used  Substance Use Topics  . Alcohol use: No    Family History  Problem Relation Age of Onset  . CAD Father   . Heart attack Father      Current medication list and allergy/intolerance information reviewed:    Current Outpatient Medications  Medication Sig Dispense Refill  . Calcium Carbonate-Vitamin D (CALTRATE 600+D PO) Take by mouth 2 (two) times daily.    . carvedilol (COREG) 12.5 MG tablet Take 1 tablet (12.5 mg total) by mouth 2 (two) times daily with a meal.    . ELIQUIS 5 MG TABS tablet TAKE 1 TABLET(5 MG) BY MOUTH TWICE DAILY 60 tablet 0  . escitalopram (LEXAPRO) 5 MG tablet TAKE 1 TABLET BY MOUTH DAILY 90 tablet 0  . FeFum-FePoly-FA-B Cmp-C-Biot (INTEGRA PLUS) CAPS TAKE 1 CAPSULE BY MOUTH DAILY 90 capsule 0  . Fluticasone-Salmeterol (ADVAIR DISKUS) 500-50 MCG/DOSE AEPB Inhale 1 puff into the lungs 2 (two) times daily. 60 each 3  . gabapentin (NEURONTIN) 300 MG capsule TAKE 1 CAPSULE(300 MG) BY MOUTH THREE TIMES DAILY 90 capsule 0  . glucose blood (ONE TOUCH ULTRA TEST) test strip Check blood sugar up to 4 times daily. Dx DM ICD 10 E11.9 insulin dependent. 200 each prn  . Insulin Glargine (TOUJEO SOLOSTAR) 300 UNIT/ML SOPN Inject 10-15 Units into the skin daily. 4.5 mL 6  . Insulin Pen Needle (BD PEN NEEDLE NANO U/F) 32G X 4 MM MISC Inject into skin once daily. Use with insulin pen. DX DM ICD-10 E11.22 100 each prn  . ipratropium-albuterol (DUONEB) 0.5-2.5 (3) MG/3ML SOLN Take 3 mLs by nebulization every 2 (two) hours as needed (wheeze, SOB, cough). 60 mL 1  . IRON PO Take by mouth.    Marland Kitchen JANUVIA 50 MG tablet TAKE 1 TABLET BY MOUTH EVERY DAY 90 tablet 0  . levofloxacin (LEVAQUIN) 250 MG tablet Take 1 tablet (250 mg total) by mouth daily. Day 1: Take 2 tablets (500 mg total) po once. Days 2-7: Take 1 tablet (250 mg total) po daily 8 tablet 0  . levothyroxine (SYNTHROID, LEVOTHROID) 88 MCG tablet TAKE 1 TABLET BY MOUTH DAILY BEFORE BREAKFAST 90 tablet 1  . montelukast (SINGULAIR)  10 MG tablet TAKE 1 TABLET BY MOUTH AT BEDTIME 30 tablet 0  . Multiple Vitamin (MULTIVITAMIN) capsule Take 1 capsule by mouth.    Marland Kitchen omeprazole (PRILOSEC) 20 MG capsule TAKE 1 CAPSULE BY MOUTH TWICE DAILY 60 capsule 2  . pantoprazole (PROTONIX) 40 MG tablet TAKE 1 TABLET BY MOUTH DAILY 90 tablet 0   No current facility-administered medications for this visit.     Allergies  Allergen Reactions  . Tetracycline Shortness Of Breath  . Tetracyclines & Related Anaphylaxis and Shortness Of Breath    dizziness  . Latex Dermatitis    Bandaids  Bandages, pulls skin  . Other Dermatitis    Bandaids   . Sulfamethoxazole-Trimethoprim Other (See Comments)    dizzy  . Amlodipine     Edema  . Amoxicillin     Questionable rash  . Tape Dermatitis    Bandaids  .  Sulfa Antibiotics Rash      Review of Systems:  Constitutional:  No  fever, no chills, +recent illness, No unintentional weight changes. No significant fatigue.   HEENT: No  headache, no vision change  Cardiac: +chest pain, No  pressure, No palpitations, No  Orthopnea  Respiratory:  No  shortness of breath. No  Cough  Gastrointestinal: No  abdominal pain, No  nausea  Genitourinary: No  incontinence, No  abnormal genital bleeding, No abnormal genital discharge  Skin: No  Rash  Neurologic: No  weakness, No  dizziness   Exam:  BP (!) 154/73   Pulse 61   Wt 167 lb (75.8 kg)   LMP  (LMP Unknown)   SpO2 90%   BMI 26.16 kg/m  SaO2 increased to 92% with deep breaths.   Constitutional: VS see above. General Appearance: alert, well-developed, well-nourished, NAD  Eyes: Normal lids and conjunctive, non-icteric sclera  Ears, Nose, Mouth, Throat: MMM, Normal external inspection ears/nares/mouth/lips/gums.   Neck: No masses, trachea midline.   Respiratory: Normal respiratory effort. no wheeze, no rhonchi, no rales  Cardiovascular: S1/S2 normal, no murmur, no rub/gallop auscultated. RRR. No lower extremity edema.    Gastrointestinal: Nontender, no masses.  Musculoskeletal: Gait normal. No clubbing/cyanosis of digits.   Neurological: Normal balance/coordination. No tremor.   Skin: warm, dry, intact. No rash/ulcer.   Psychiatric: Normal judgment/insight. Normal mood and affect. Oriented x3.    No results found for this or any previous visit (from the past 72 hour(s)).  Dg Chest 2 View  Result Date: 09/29/2017 CLINICAL DATA:  Elevated white blood cell count and palpitations. History of asthma, coronary artery disease, and diabetes. Never smoked. EXAM: CHEST  2 VIEW COMPARISON:  Chest x-ray of Jan 18, 2017 FINDINGS: The lungs are adequately inflated. The interstitial markings are more conspicuous today with areas of patchy density noted in the left lower lung. The heart is normal in size. The central pulmonary vascularity is slightly more conspicuous today. The patient has undergone previous CABG. There calcification in the wall of the aortic arch. There is no pleural effusion. There is calcification within the right breast which is stable. The bony thorax exhibits no acute abnormality. IMPRESSION: Mildly increased interstitial markings diffusely suggest early interstitial pneumonia or less likely interstitial edema. Patchy airspace opacity in the left lower lung is worrisome for pneumonia. Followup PA and lateral chest X-ray is recommended in 3-4 weeks following trial of antibiotic therapy to ensure resolution and exclude underlying malignancy. Thoracic aortic atherosclerosis. Electronically Signed   By: David  Martinique M.D.   On: 09/29/2017 14:26     ASSESSMENT/PLAN: The primary encounter diagnosis was Musculoskeletal chest pain. Diagnoses of Chronic atrial fibrillation ([place]), Essential hypertension, benign, Coronary artery disease due to lipid rich plaque, and Chronic obstructive pulmonary disease, unspecified COPD type ([place]) were also pertinent to this visit.  SaO2 could be better but with known COPD I am  not so concerned. No coughing or SOB to concern as much for COPD exacerbation but we could certainly consider steroid burst - pt does not want to take any steroids.   She is anticoagulated, no SOB/palpitations at this time which would concern me for a PE and no s/s DVT. Compliant with meds.   Discussed likely chest wall pain, f/u w/ Cardio for 2nd opinion, pt will call today when she gets home - if no appt in next week or so will let me know and I'll try to get her in sooner.  Visit summary with medication list and pertinent instructions was printed for patient to review. All questions at time of visit were answered - patient instructed to contact office with any additional concerns. ER/RTC precautions were reviewed with the patient.   Follow-up plan: Return for routine recheck and next scheduled visit 10/2017.  Note: Total time spent 25 minutes, greater than 50% of the visit was spent face-to-face counseling and coordinating care for the following: The primary encounter diagnosis was Musculoskeletal chest pain. Diagnoses of Chronic atrial fibrillation ([place]), Essential hypertension, benign, Coronary artery disease due to lipid rich plaque, and Chronic obstructive pulmonary disease, unspecified COPD type ([place]) were also pertinent to this visit.Marland Kitchen  Please note: voice recognition software was used to produce this document, and typos may escape review. Please contact Dr. Sheppard Coil for any needed clarifications.

## 2017-10-03 NOTE — Telephone Encounter (Signed)
Pt called stating that next available appt w/ [name] will be May 2019. Pt requesting feedback from pcp. Thanks.

## 2017-10-04 NOTE — Telephone Encounter (Signed)
Okay, that fine. If patient gets worse, would have her be which ever one of his partners is available or may need to request other cardiology consult. Please call patient, if she is okay with this plan, just wanted to let her know that we tried to move things along for her but it looks like cardiology is just booked up

## 2017-10-04 NOTE — Telephone Encounter (Signed)
As per [name] staff - dr is "booked" in all office locations until May. Spoke to pt - she does not want to drive to locations in [place] or [place]. She informed the staff at [name] office to give her a call if someone should cancel their appt so that she may be seen sooner than May 2019 appt.

## 2017-10-05 NOTE — Telephone Encounter (Signed)
Pt informed of provider's note and recommendations. No other inquiries asked during phone call.

## 2017-10-06 ENCOUNTER — Ambulatory Visit: Payer: Medicare Other | Admitting: Osteopathic Medicine

## 2017-10-13 ENCOUNTER — Other Ambulatory Visit: Payer: Self-pay | Admitting: Osteopathic Medicine

## 2017-10-13 DIAGNOSIS — B0223 Postherpetic polyneuropathy: Secondary | ICD-10-CM

## 2017-10-18 ENCOUNTER — Ambulatory Visit (INDEPENDENT_AMBULATORY_CARE_PROVIDER_SITE_OTHER): Payer: Medicare Other | Admitting: Osteopathic Medicine

## 2017-10-18 ENCOUNTER — Other Ambulatory Visit: Payer: Self-pay | Admitting: Osteopathic Medicine

## 2017-10-18 ENCOUNTER — Encounter: Payer: Self-pay | Admitting: Osteopathic Medicine

## 2017-10-18 VITALS — BP 170/73 | HR 82 | Temp 97.5°F | Wt 166.0 lb

## 2017-10-18 DIAGNOSIS — N183 Chronic kidney disease, stage 3 unspecified: Secondary | ICD-10-CM

## 2017-10-18 DIAGNOSIS — I482 Chronic atrial fibrillation, unspecified: Secondary | ICD-10-CM

## 2017-10-18 DIAGNOSIS — E162 Hypoglycemia, unspecified: Secondary | ICD-10-CM

## 2017-10-18 DIAGNOSIS — I2583 Coronary atherosclerosis due to lipid rich plaque: Secondary | ICD-10-CM

## 2017-10-18 DIAGNOSIS — E0822 Diabetes mellitus due to underlying condition with diabetic chronic kidney disease: Secondary | ICD-10-CM

## 2017-10-18 DIAGNOSIS — Z794 Long term (current) use of insulin: Secondary | ICD-10-CM

## 2017-10-18 DIAGNOSIS — I10 Essential (primary) hypertension: Secondary | ICD-10-CM | POA: Diagnosis not present

## 2017-10-18 DIAGNOSIS — I2510 Atherosclerotic heart disease of native coronary artery without angina pectoris: Secondary | ICD-10-CM

## 2017-10-18 DIAGNOSIS — M791 Myalgia, unspecified site: Secondary | ICD-10-CM | POA: Diagnosis not present

## 2017-10-18 LAB — POCT GLYCOSYLATED HEMOGLOBIN (HGB A1C): Hemoglobin A1C: 8

## 2017-10-18 MED ORDER — BENAZEPRIL HCL 5 MG PO TABS: 5.0000 mg | ORAL_TABLET | Freq: Every day | ORAL | 1 refills | Status: DC

## 2017-10-18 NOTE — Progress Notes (Signed)
HPI: Briana Werner is a 82 y.o. female who  has a past medical history of Asthma, Atrial flutter ([place]), CAD (coronary artery disease), Cerebrovascular disease, Chronic renal insufficiency (02/26/2014), COPD (chronic obstructive pulmonary disease) ([place]) (02/26/2014), Hypothyroid (02/26/2014), Presence of stent in coronary artery in patient with coronary artery disease (02/26/2014), Renal artery stenosis ([place]), and Type 2 diabetes mellitus ([place]) (02/26/2014).  she presents to [facility] today, 10/18/17,  for chief complaint of: Diabetes follow up  HTN follow up   DM2 Toujeo 10 units daily Januvia 50 mg daily  A1C indicates good control Reports hypoglycemic episode at her daughter's, she passed out, she can't remember the Glc number   HTN BP poorly controlled today, discussed goals with patient and she would like to go ahead and start medication. No chest pain, pressure, shortness of breath.  Leg pain Following with sports medicine for leg/back pain. Overdue for follow-up on this.    Past medical, surgical, social and family history reviewed:  Patient Active Problem List   Diagnosis Date Noted  . CKD (chronic kidney disease) stage 4, GFR 15-29 ml/min (HCC) 02/25/2017  . Nasal polyposis 12/07/2016  . Cough 07/13/2016  . Tinea pedis 05/25/2016  . Diabetic foot ulcer ([place]) 05/25/2016  . Left hip pain 04/15/2016  . Lumbar radicular pain 01/15/2016  . Aortic atherosclerosis ([place]) 01/12/2016  . Anemia 02/16/2015  . Atrial fibrillation ([place]) 12/25/2014  . Diabetes mellitus with kidney complication ([place]) 56/43/3295  . Spondylisthesis 05/31/2014  . CAD (coronary artery disease) 05/01/2014  . Renal artery stenosis ([place]) 05/01/2014  . Generalized anxiety disorder 04/03/2014  . CKD (chronic kidney disease) stage 3, GFR 30-59 ml/min (HCC) 03/26/2014  . Chronic atrial flutter ([place]) 03/19/2014  . Carotid artery stenosis 03/19/2014  . Hypercalcemia 02/27/2014   . Essential hypertension, benign 02/26/2014  . Hypothyroid 02/26/2014  . Hyperlipidemia 02/26/2014  . COPD (chronic obstructive pulmonary disease) ([place]) 02/26/2014  . Dementia 02/26/2014  . Presence of stent in coronary artery in patient with coronary artery disease 02/26/2014  . Nephrolithiasis 02/26/2014  . Diabetic peripheral neuropathy ([place]) 02/26/2014  . DVT, lower extremity, recurrent ([place]) 02/26/2014  . Status post left hip replacement 02/26/2014  . Overactive bladder 02/26/2014    Past Surgical History:  Procedure Laterality Date  . ABDOMINAL HYSTERECTOMY    . APPENDECTOMY    . BACK SURGERY    . CHOLECYSTECTOMY    . CORONARY ARTERY BYPASS GRAFT     2004, [place]  . Hip replacement    . KNEE SURGERY Bilateral   . NASAL SINUS SURGERY    . SHOULDER SURGERY    . TONSILLECTOMY      Social History   Tobacco Use  . Smoking status: Never Smoker  . Smokeless tobacco: Never Used  Substance Use Topics  . Alcohol use: No    Family History  Problem Relation Age of Onset  . CAD Father   . Heart attack Father      Current medication list and allergy/intolerance information reviewed:    Current Outpatient Medications  Medication Sig Dispense Refill  . Calcium Carbonate-Vitamin D (CALTRATE 600+D PO) Take by mouth 2 (two) times daily.    . carvedilol (COREG) 12.5 MG tablet Take 1 tablet (12.5 mg total) by mouth 2 (two) times daily with a meal.    . ELIQUIS 5 MG TABS tablet TAKE 1 TABLET(5 MG) BY MOUTH TWICE DAILY 60 tablet 0  . escitalopram (LEXAPRO) 5 MG tablet TAKE 1 TABLET BY  MOUTH DAILY 90 tablet 0  . FeFum-FePoly-FA-B Cmp-C-Biot (INTEGRA PLUS) CAPS TAKE 1 CAPSULE BY MOUTH DAILY 90 capsule 0  . Fluticasone-Salmeterol (ADVAIR DISKUS) 500-50 MCG/DOSE AEPB Inhale 1 puff into the lungs 2 (two) times daily. 60 each 3  . gabapentin (NEURONTIN) 300 MG capsule TAKE 1 CAPSULE(300 MG) BY MOUTH THREE TIMES DAILY 90 capsule 0  . glucose blood (ONE TOUCH ULTRA TEST) test strip  Check blood sugar up to 4 times daily. Dx DM ICD 10 E11.9 insulin dependent. 200 each prn  . Insulin Glargine (TOUJEO SOLOSTAR) 300 UNIT/ML SOPN Inject 10-15 Units into the skin daily. 4.5 mL 6  . Insulin Pen Needle (BD PEN NEEDLE NANO U/F) 32G X 4 MM MISC Inject into skin once daily. Use with insulin pen. DX DM ICD-10 E11.22 100 each prn  . ipratropium-albuterol (DUONEB) 0.5-2.5 (3) MG/3ML SOLN Take 3 mLs by nebulization every 2 (two) hours as needed (wheeze, SOB, cough). 60 mL 1  . IRON PO Take by mouth.    Marland Kitchen JANUVIA 50 MG tablet TAKE 1 TABLET BY MOUTH EVERY DAY 90 tablet 0  . levofloxacin (LEVAQUIN) 250 MG tablet Take 1 tablet (250 mg total) by mouth daily. Day 1: Take 2 tablets (500 mg total) po once. Days 2-7: Take 1 tablet (250 mg total) po daily 8 tablet 0  . levothyroxine (SYNTHROID, LEVOTHROID) 88 MCG tablet TAKE 1 TABLET BY MOUTH DAILY BEFORE BREAKFAST 90 tablet 1  . montelukast (SINGULAIR) 10 MG tablet TAKE 1 TABLET BY MOUTH AT BEDTIME 30 tablet 0  . Multiple Vitamin (MULTIVITAMIN) capsule Take 1 capsule by mouth.    Marland Kitchen omeprazole (PRILOSEC) 20 MG capsule TAKE 1 CAPSULE BY MOUTH TWICE DAILY 60 capsule 2  . pantoprazole (PROTONIX) 40 MG tablet TAKE 1 TABLET BY MOUTH DAILY 90 tablet 0   No current facility-administered medications for this visit.     Allergies  Allergen Reactions  . Tetracycline Shortness Of Breath  . Tetracyclines & Related Anaphylaxis and Shortness Of Breath    dizziness  . Latex Dermatitis    Bandaids  Bandages, pulls skin  . Other Dermatitis    Bandaids   . Sulfamethoxazole-Trimethoprim Other (See Comments)    dizzy  . Amlodipine     Edema  . Amoxicillin     Questionable rash  . Tape Dermatitis    Bandaids  . Sulfa Antibiotics Rash      Review of Systems:  Constitutional:  No  fever, no chills, No recent illness, No unintentional weight changes. +fatigue.   HEENT: No  headache, no vision change, no hearing change  Cardiac: No  chest pain, No   pressure, No palpitations, No  Orthopnea  Respiratory:  No  shortness of breath. No  Cough  Musculoskeletal: No new myalgia/arthralgia  Skin: No  Rash  Endocrine: No cold intolerance,  No heat intolerance. No polyuria/polydipsia/polyphagia   Neurologic: No  weakness, No  dizziness, No  slurred speech/focal weakness/facial droop  Psychiatric: No  concerns with depression, No  concerns with anxiety  Exam:  BP (!) 170/73 (BP Location: Left Arm)   Pulse 82   Temp (!) 97.5 F (36.4 C) (Oral)   Wt 166 lb (75.3 kg)   LMP  (LMP Unknown)   BMI 26.00 kg/m   Constitutional: VS see above. General Appearance: alert, well-developed, well-nourished, NAD  Eyes: Normal lids and conjunctive, non-icteric sclera  Ears, Nose, Mouth, Throat: MMM, Normal external inspection ears/nares/mouth/lips/gums.  Neck: No masses, trachea midline. No thyroid enlargement. No  tenderness/mass appreciated. No lymphadenopathy  Respiratory: Normal respiratory effort. no wheeze, no rhonchi, no rales  Cardiovascular: S1/S2 normal, no murmur, no rub/gallop auscultated. RRR. No lower extremity edema.   Musculoskeletal: Gait steady with use of walker but slow. No clubbing/cyanosis of digits.   Neurological: Normal balance/coordination. No tremor.  Skin: warm, dry, intact.  Psychiatric: Normal judgment/insight. Normal mood and affect. Oriented x3.    Results for orders placed or performed in visit on 10/18/17 (from the past 72 hour(s))  POCT HgB A1C     Status: None   Collection Time: 10/18/17  3:56 PM  Result Value Ref Range   Hemoglobin A1C 8.0     No results found.   ASSESSMENT/PLAN:   Diabetes mellitus due to underlying condition with stage 3 chronic kidney disease, with long-term current use of insulin (HCC) - Plan: POCT HgB A1C  Chronic atrial fibrillation (HCC) - Continue anticoagulation and rate control.  Essential hypertension, benign - Adding low-dose ACE inhibitor, monitor closely, patient  to let me know if any dizziness or orthostatic symptoms  Hypoglycemia - Patient advised to write down numbers, bring these to office. She is reluctant to decrease medications at this point. Consider DC po meds/reduce insulin  Coronary artery disease due to lipid rich plaque - Adding ACE inhibitor today  Myalgia - Advise follow-up with sports medicine next week when she is following up with me to recheck blood pressure      Visit summary with medication list and pertinent instructions was printed for patient to review. All questions at time of visit were answered - patient instructed to contact office with any additional concerns. ER/RTC precautions were reviewed with the patient.   Follow-up plan: Return for 10/24/17 at 10:10 with Dr. Loni Muse. to recheck BP, and 10:30 with Dr Georgina Snell to recheck legs/back .  Note: Total time spent 25 minutes, greater than 50% of the visit was spent face-to-face counseling and coordinating care for the following: The primary encounter diagnosis was Diabetes mellitus due to underlying condition with stage 3 chronic kidney disease, with long-term current use of insulin ([place]). Diagnoses of Chronic atrial fibrillation ([place]), Essential hypertension, benign, and Hypoglycemia were also pertinent to this visit.Marland Kitchen  Please note: voice recognition software was used to produce this document, and typos may escape review. Please contact Dr. Sheppard Coil for any needed clarifications.

## 2017-10-19 ENCOUNTER — Encounter: Payer: Self-pay | Admitting: Osteopathic Medicine

## 2017-10-24 ENCOUNTER — Ambulatory Visit (INDEPENDENT_AMBULATORY_CARE_PROVIDER_SITE_OTHER): Payer: Medicare Other | Admitting: Family Medicine

## 2017-10-24 ENCOUNTER — Encounter: Payer: Self-pay | Admitting: Osteopathic Medicine

## 2017-10-24 ENCOUNTER — Ambulatory Visit (INDEPENDENT_AMBULATORY_CARE_PROVIDER_SITE_OTHER): Payer: Medicare Other | Admitting: Osteopathic Medicine

## 2017-10-24 VITALS — BP 145/75 | HR 105 | Temp 97.3°F | Wt 165.0 lb

## 2017-10-24 VITALS — HR 105 | Temp 97.3°F | Wt 165.0 lb

## 2017-10-24 DIAGNOSIS — I2583 Coronary atherosclerosis due to lipid rich plaque: Secondary | ICD-10-CM

## 2017-10-24 DIAGNOSIS — M79605 Pain in left leg: Secondary | ICD-10-CM

## 2017-10-24 DIAGNOSIS — M5416 Radiculopathy, lumbar region: Secondary | ICD-10-CM

## 2017-10-24 DIAGNOSIS — M4317 Spondylolisthesis, lumbosacral region: Secondary | ICD-10-CM

## 2017-10-24 DIAGNOSIS — R059 Cough, unspecified: Secondary | ICD-10-CM

## 2017-10-24 DIAGNOSIS — I482 Chronic atrial fibrillation, unspecified: Secondary | ICD-10-CM

## 2017-10-24 DIAGNOSIS — I2510 Atherosclerotic heart disease of native coronary artery without angina pectoris: Secondary | ICD-10-CM

## 2017-10-24 DIAGNOSIS — R05 Cough: Secondary | ICD-10-CM | POA: Diagnosis not present

## 2017-10-24 DIAGNOSIS — M5136 Other intervertebral disc degeneration, lumbar region: Secondary | ICD-10-CM | POA: Diagnosis not present

## 2017-10-24 DIAGNOSIS — I10 Essential (primary) hypertension: Secondary | ICD-10-CM | POA: Diagnosis not present

## 2017-10-24 DIAGNOSIS — M79604 Pain in right leg: Secondary | ICD-10-CM

## 2017-10-24 DIAGNOSIS — I700 Atherosclerosis of aorta: Secondary | ICD-10-CM

## 2017-10-24 MED ORDER — DICLOFENAC SODIUM 1 % TD GEL: 4.0000 g | Freq: Four times a day (QID) | TRANSDERMAL | 11 refills | Status: AC

## 2017-10-24 NOTE — Progress Notes (Signed)
HPI: Briana Werner is a 82 y.o. female who  has a past medical history of Asthma, Atrial flutter ([place]), CAD (coronary artery disease), Cerebrovascular disease, Chronic renal insufficiency (02/26/2014), COPD (chronic obstructive pulmonary disease) ([place]) (02/26/2014), Hypothyroid (02/26/2014), Presence of stent in coronary artery in patient with coronary artery disease (02/26/2014), Renal artery stenosis ([place]), and Type 2 diabetes mellitus ([place]) (02/26/2014).  she presents to [facility] today, 10/24/17,  for chief complaint of: HTN follow up   HTN BP poorly controlled last viist, discussed goals with patient and we decided to go ahead and start medication at low dose. No chest pain, pressure, shortness of breath. Benazepril - doing okay on this medicine. BP a bit better - was 170/73 at visit 10/18/2017, today on intake 150/76, on recheck 5 mins later was 145/75  Cough Worse w/ drinking, any liquid affects this. Previously seen by ENT in [place], no records available at this time.   DM2 No other episodes of hypoglycemia, fasting Glc in 130s  Leg pain Following with sports medicine for leg/back pain. Overdue for follow-up on this - has appt w/ Dr Georgina Snell today.     Past medical, surgical, social and family history reviewed:  Patient Active Problem List   Diagnosis Date Noted  . CKD (chronic kidney disease) stage 4, GFR 15-29 ml/min (HCC) 02/25/2017  . Nasal polyposis 12/07/2016  . Cough 07/13/2016  . Tinea pedis 05/25/2016  . Diabetic foot ulcer ([place]) 05/25/2016  . Left hip pain 04/15/2016  . Lumbar radicular pain 01/15/2016  . Aortic atherosclerosis ([place]) 01/12/2016  . Anemia 02/16/2015  . Atrial fibrillation ([place]) 12/25/2014  . Diabetes mellitus with kidney complication ([place]) [id number]/81/8299  . Spondylisthesis 05/31/2014  . CAD (coronary artery disease) 05/01/2014  . Renal artery stenosis ([place]) 05/01/2014  . Generalized anxiety disorder 04/03/2014  . CKD  (chronic kidney disease) stage 3, GFR 30-59 ml/min (HCC) 03/26/2014  . Chronic atrial flutter ([place]) 03/19/2014  . Carotid artery stenosis 03/19/2014  . Hypercalcemia 02/27/2014  . Essential hypertension, benign 02/26/2014  . Hypothyroid 02/26/2014  . Hyperlipidemia 02/26/2014  . COPD (chronic obstructive pulmonary disease) ([place]) 02/26/2014  . Dementia 02/26/2014  . Presence of stent in coronary artery in patient with coronary artery disease 02/26/2014  . Nephrolithiasis 02/26/2014  . Diabetic peripheral neuropathy ([place]) 02/26/2014  . DVT, lower extremity, recurrent ([place]) 02/26/2014  . Status post left hip replacement 02/26/2014  . Overactive bladder 02/26/2014    Past Surgical History:  Procedure Laterality Date  . ABDOMINAL HYSTERECTOMY    . APPENDECTOMY    . BACK SURGERY    . CHOLECYSTECTOMY    . CORONARY ARTERY BYPASS GRAFT     2004, [place]  . Hip replacement    . KNEE SURGERY Bilateral   . NASAL SINUS SURGERY    . SHOULDER SURGERY    . TONSILLECTOMY      Social History   Tobacco Use  . Smoking status: Never Smoker  . Smokeless tobacco: Never Used  Substance Use Topics  . Alcohol use: No    Family History  Problem Relation Age of Onset  . CAD Father   . Heart attack Father      Current medication list and allergy/intolerance information reviewed:    Current Outpatient Medications  Medication Sig Dispense Refill  . benazepril (LOTENSIN) 5 MG tablet Take 1 tablet (5 mg total) by mouth daily. 30 tablet 1  . Calcium Carbonate-Vitamin D (CALTRATE 600+D PO) Take by mouth 2 (two) times  daily.    . carvedilol (COREG) 12.5 MG tablet Take 1 tablet (12.5 mg total) by mouth 2 (two) times daily with a meal.    . ELIQUIS 5 MG TABS tablet TAKE 1 TABLET(5 MG) BY MOUTH TWICE DAILY 60 tablet 0  . escitalopram (LEXAPRO) 5 MG tablet TAKE 1 TABLET BY MOUTH DAILY 90 tablet 0  . FeFum-FePoly-FA-B Cmp-C-Biot (INTEGRA PLUS) CAPS TAKE 1 CAPSULE BY MOUTH DAILY 90 capsule 0  .  Fluticasone-Salmeterol (ADVAIR DISKUS) 500-50 MCG/DOSE AEPB Inhale 1 puff into the lungs 2 (two) times daily. 60 each 3  . gabapentin (NEURONTIN) 300 MG capsule TAKE 1 CAPSULE(300 MG) BY MOUTH THREE TIMES DAILY 90 capsule 0  . glucose blood (ONE TOUCH ULTRA TEST) test strip Check blood sugar up to 4 times daily. Dx DM ICD 10 E11.9 insulin dependent. 200 each prn  . Insulin Glargine (TOUJEO SOLOSTAR) 300 UNIT/ML SOPN Inject 10-15 Units into the skin daily. 4.5 mL 6  . Insulin Pen Needle (BD PEN NEEDLE NANO U/F) 32G X 4 MM MISC Inject into skin once daily. Use with insulin pen. DX DM ICD-10 E11.22 100 each prn  . ipratropium-albuterol (DUONEB) 0.5-2.5 (3) MG/3ML SOLN Take 3 mLs by nebulization every 2 (two) hours as needed (wheeze, SOB, cough). 60 mL 1  . IRON PO Take by mouth.    Marland Kitchen JANUVIA 50 MG tablet TAKE 1 TABLET BY MOUTH EVERY DAY 90 tablet 0  . levothyroxine (SYNTHROID, LEVOTHROID) 88 MCG tablet TAKE 1 TABLET BY MOUTH DAILY BEFORE BREAKFAST 90 tablet 1  . montelukast (SINGULAIR) 10 MG tablet TAKE 1 TABLET BY MOUTH AT BEDTIME 30 tablet 0  . Multiple Vitamin (MULTIVITAMIN) capsule Take 1 capsule by mouth.    Marland Kitchen omeprazole (PRILOSEC) 20 MG capsule TAKE 1 CAPSULE BY MOUTH TWICE DAILY 60 capsule 2  . pantoprazole (PROTONIX) 40 MG tablet TAKE 1 TABLET BY MOUTH DAILY 90 tablet 0   No current facility-administered medications for this visit.     Allergies  Allergen Reactions  . Tetracycline Shortness Of Breath  . Tetracyclines & Related Anaphylaxis and Shortness Of Breath    dizziness  . Latex Dermatitis    Bandaids  Bandages, pulls skin  . Other Dermatitis    Bandaids   . Sulfamethoxazole-Trimethoprim Other (See Comments)    dizzy  . Amlodipine     Edema  . Amoxicillin     Questionable rash  . Tape Dermatitis    Bandaids  . Sulfa Antibiotics Rash      Review of Systems:  Constitutional:  No  fever, no chills, No recent illness, No unintentional weight changes. +fatigue.    HEENT: No  headache, no vision change, no hearing change  Cardiac: No  chest pain, No  pressure, No palpitations, No  Orthopnea  Respiratory:  No  shortness of breath. +Cough  Musculoskeletal: No new myalgia/arthralgia  Skin: No  Rash  Endocrine: No cold intolerance,  No heat intolerance. No polyuria/polydipsia/polyphagia   Neurologic: No  weakness, No  dizziness, No  slurred speech/focal weakness/facial droop  Psychiatric: No  concerns with depression, No  concerns with anxiety  Exam:  BP (!) 145/75   Pulse (!) 105   Temp (!) 97.3 F (36.3 C) (Oral)   Wt 165 lb (74.8 kg)   LMP  (LMP Unknown)   BMI 25.84 kg/m   Constitutional: VS see above. General Appearance: alert, well-developed, well-nourished, NAD  Eyes: Normal lids and conjunctive, non-icteric sclera  Ears, Nose, Mouth, Throat: MMM, Normal external  inspection ears/nares/mouth/lips/gums.  Neck: No masses, trachea midline. No thyroid enlargement. No tenderness/mass appreciated. No lymphadenopathy  Respiratory: Normal respiratory effort. no wheeze, no rhonchi, no rales  Cardiovascular: S1/S2 normal, no murmur, no rub/gallop auscultated. Irreg/Irreg. No lower extremity edema.   Musculoskeletal: Gait steady with use of walker but slow. No clubbing/cyanosis of digits.   Neurological: Normal balance/coordination. No tremor.  Skin: warm, dry, intact.  Psychiatric: Normal judgment/insight. Normal mood and affect. Oriented x3.      ASSESSMENT/PLAN:   Essential hypertension, benign - keep meds as-is for now, consider up BB  Chronic atrial fibrillation (HCC)  Cough - sounds like esophageal/epiglottis issue - will trial ENT and possible need GI referral - Plan: Ambulatory referral to ENT      Visit summary with medication list and pertinent instructions was printed for patient to review. All questions at time of visit were answered - patient instructed to contact office with any additional concerns. ER/RTC  precautions were reviewed with the patient.   Follow-up plan: No Follow-up on file.  Note: Total time spent 25 minutes, greater than 50% of the visit was spent face-to-face counseling and coordinating care for the following: The primary encounter diagnosis was Essential hypertension, benign. Diagnoses of Chronic atrial fibrillation (HCC) and Cough were also pertinent to this visit.Marland Kitchen  Please note: voice recognition software was used to produce this document, and typos may escape review. Please contact Dr. Sheppard Coil for any needed clarifications.

## 2017-10-24 NOTE — Patient Instructions (Addendum)
Get MRI soon.  Apply the diclofenac gel to the knees 4x daily.  You should hear about the MRI and the leg blood flow study soon.  Recheck with me after the MRI. To go over results.  Call or go to the ER if you develop a large red swollen joint with extreme pain or oozing puss.    Sciatica Sciatica is pain, numbness, weakness, or tingling along your sciatic nerve. The sciatic nerve starts in the lower back and goes down the back of each leg. Sciatica happens when this nerve is pinched or has pressure put on it. Sciatica usually goes away on its own or with treatment. Sometimes, sciatica may keep coming back (recur). Follow these instructions at home: Medicines  Take over-the-counter and prescription medicines only as told by your doctor.  Do not drive or use heavy machinery while taking prescription pain medicine. Managing pain  If directed, put ice on the affected area. ? Put ice in a plastic bag. ? Place a towel between your skin and the bag. ? Leave the ice on for 20 minutes, 2-3 times a day.  After icing, apply heat to the affected area before you exercise or as often as told by your doctor. Use the heat source that your doctor tells you to use, such as a moist heat pack or a heating pad. ? Place a towel between your skin and the heat source. ? Leave the heat on for 20-30 minutes. ? Remove the heat if your skin turns bright red. This is especially important if you are unable to feel pain, heat, or cold. You may have a greater risk of getting burned. Activity  Return to your normal activities as told by your doctor. Ask your doctor what activities are safe for you. ? Avoid activities that make your sciatica worse.  Take short rests during the day. Rest in a lying or standing position. This is usually better than sitting to rest. ? When you rest for a long time, do some physical activity or stretching between periods of rest. ? Avoid sitting for a long time without moving. Get up  and move around at least one time each hour.  Exercise and stretch regularly, as told by your doctor.  Do not lift anything that is heavier than 10 lb (4.5 kg) while you have symptoms of sciatica. ? Avoid lifting heavy things even when you do not have symptoms. ? Avoid lifting heavy things over and over.  When you lift objects, always lift in a way that is safe for your body. To do this, you should: ? Bend your knees. ? Keep the object close to your body. ? Avoid twisting. General instructions  Use good posture. ? Avoid leaning forward when you are sitting. ? Avoid hunching over when you are standing.  Stay at a healthy weight.  Wear comfortable shoes that support your feet. Avoid wearing high heels.  Avoid sleeping on a mattress that is too soft or too hard. You might have less pain if you sleep on a mattress that is firm enough to support your back.  Keep all follow-up visits as told by your doctor. This is important. Contact a doctor if:  You have pain that: ? Wakes you up when you are sleeping. ? Gets worse when you lie down. ? Is worse than the pain you have had in the past. ? Lasts longer than 4 weeks.  You lose weight for without trying. Get help right away if:  You  cannot control when you pee (urinate) or poop (have a bowel movement).  You have weakness in any of these areas and it gets worse. ? Lower back. ? Lower belly (pelvis). ? Butt (buttocks). ? Legs.  You have redness or swelling of your back.  You have a burning feeling when you pee. This information is not intended to replace advice given to you by your health care provider. Make sure you discuss any questions you have with your health care provider. Document Released: 06/08/2008 Document Revised: 02/05/2016 Document Reviewed: 05/09/2015 Elsevier Interactive Patient Education  [employer].

## 2017-10-24 NOTE — Progress Notes (Signed)
Hailey Stormer is a 82 y.o. female who presents to [facility] today for follow up back and leg pain.   Chloe has a history of back pain and leg pain. She has a history of lumbar DDD,. She had a lumbar MRI in 2015 showing spinal stenosis and mild nerve root impingement.  Did pretty well with mild low back pain until recently.  She notes continued back pain but her main complaint is greater than 2 months of pain radiating down her legs bilaterally.  She denies significant weakness but notes that walking makes her pain worse and she is having some trouble walking.  She points predominantly to her bilateral calves and notes the left is worse than right.  Additionally she notes a significant problem with coronary artery disease and renal vascular disease and cerebrovascular disease.  She denies any bowel or bladder dysfunction.   Past Medical History:  Diagnosis Date  . Asthma   . Atrial flutter ([place])   . CAD (coronary artery disease)   . Cerebrovascular disease   . Chronic renal insufficiency 02/26/2014  . COPD (chronic obstructive pulmonary disease) ([place]) 02/26/2014  . Hypothyroid 02/26/2014  . Presence of stent in coronary artery in patient with coronary artery disease 02/26/2014  . Renal artery stenosis ([place])   . Type 2 diabetes mellitus ([place]) 02/26/2014   Past Surgical History:  Procedure Laterality Date  . ABDOMINAL HYSTERECTOMY    . APPENDECTOMY    . BACK SURGERY    . CHOLECYSTECTOMY    . CORONARY ARTERY BYPASS GRAFT     2004, [place]  . Hip replacement    . KNEE SURGERY Bilateral   . NASAL SINUS SURGERY    . SHOULDER SURGERY    . TONSILLECTOMY     Social History   Tobacco Use  . Smoking status: Never Smoker  . Smokeless tobacco: Never Used  Substance Use Topics  . Alcohol use: No     ROS:  As above   Medications: Current Outpatient Medications  Medication Sig Dispense Refill  . benazepril (LOTENSIN) 5 MG tablet Take 1  tablet (5 mg total) by mouth daily. 30 tablet 1  . Calcium Carbonate-Vitamin D (CALTRATE 600+D PO) Take by mouth 2 (two) times daily.    . carvedilol (COREG) 12.5 MG tablet Take 1 tablet (12.5 mg total) by mouth 2 (two) times daily with a meal.    . ELIQUIS 5 MG TABS tablet TAKE 1 TABLET(5 MG) BY MOUTH TWICE DAILY 60 tablet 0  . escitalopram (LEXAPRO) 5 MG tablet TAKE 1 TABLET BY MOUTH DAILY 90 tablet 0  . FeFum-FePoly-FA-B Cmp-C-Biot (INTEGRA PLUS) CAPS TAKE 1 CAPSULE BY MOUTH DAILY 90 capsule 0  . Fluticasone-Salmeterol (ADVAIR DISKUS) 500-50 MCG/DOSE AEPB Inhale 1 puff into the lungs 2 (two) times daily. 60 each 3  . gabapentin (NEURONTIN) 300 MG capsule TAKE 1 CAPSULE(300 MG) BY MOUTH THREE TIMES DAILY 90 capsule 0  . glucose blood (ONE TOUCH ULTRA TEST) test strip Check blood sugar up to 4 times daily. Dx DM ICD 10 E11.9 insulin dependent. 200 each prn  . Insulin Glargine (TOUJEO SOLOSTAR) 300 UNIT/ML SOPN Inject 10-15 Units into the skin daily. 4.5 mL 6  . Insulin Pen Needle (BD PEN NEEDLE NANO U/F) 32G X 4 MM MISC Inject into skin once daily. Use with insulin pen. DX DM ICD-10 E11.22 100 each prn  . ipratropium-albuterol (DUONEB) 0.5-2.5 (3) MG/3ML SOLN Take 3 mLs by nebulization every 2 (two) hours  as needed (wheeze, SOB, cough). 60 mL 1  . IRON PO Take by mouth.    Marland Kitchen JANUVIA 50 MG tablet TAKE 1 TABLET BY MOUTH EVERY DAY 90 tablet 0  . levothyroxine (SYNTHROID, LEVOTHROID) 88 MCG tablet TAKE 1 TABLET BY MOUTH DAILY BEFORE BREAKFAST 90 tablet 1  . montelukast (SINGULAIR) 10 MG tablet TAKE 1 TABLET BY MOUTH AT BEDTIME 30 tablet 0  . Multiple Vitamin (MULTIVITAMIN) capsule Take 1 capsule by mouth.    Marland Kitchen omeprazole (PRILOSEC) 20 MG capsule TAKE 1 CAPSULE BY MOUTH TWICE DAILY 60 capsule 2  . pantoprazole (PROTONIX) 40 MG tablet TAKE 1 TABLET BY MOUTH DAILY 90 tablet 0  . diclofenac sodium (VOLTAREN) 1 % GEL Apply 4 g topically 4 (four) times daily. To affected joint. 100 g 11   No current  facility-administered medications for this visit.    Allergies  Allergen Reactions  . Tetracycline Shortness Of Breath  . Tetracyclines & Related Anaphylaxis and Shortness Of Breath    dizziness  . Latex Dermatitis    Bandaids  Bandages, pulls skin  . Other Dermatitis    Bandaids   . Sulfamethoxazole-Trimethoprim Other (See Comments)    dizzy  . Amlodipine     Edema  . Amoxicillin     Questionable rash  . Tape Dermatitis    Bandaids  . Sulfa Antibiotics Rash     Exam:  Pulse (!) 105   Temp (!) 97.3 F (36.3 C) (Oral)   Wt 165 lb (74.8 kg)   LMP  (LMP Unknown)   BMI 25.84 kg/m  General: Well Developed, well nourished, and in no acute distress.  Neuro/Psych: Alert and oriented x3, extra-ocular muscles intact, able to move all 4 extremities, sensation grossly intact. Skin: Warm and dry, no rashes noted.  Respiratory: Not using accessory muscles, speaking in full sentences, trachea midline.  Cardiovascular: Pulses palpable, no extremity edema. Abdomen: Does not appear distended. MSK:  L-spine: Nontender to midline. Bilateral lower extremities have normal sensation capillary refill and pulses. Reflexes are intact bilaterally Strength is intact to seated exam Patient ambulates with a walker  X-ray lumbar spine dated January 01, 2016, and MRI lumbar spine dated 2015 reviewed.  I reviewed the images and agree with the reports.    No results found for this or any previous visit (from the past 48 hour(s)). No results found.    Assessment and Plan: 82 y.o. female with  Bilateral lower extremity pain.  This is likely multifactorial but I think the main problem is very likely lumbar radicular symptoms probably due to spinal stenosis.  She is not a good surgical candidate but I do think that she is a candidate for epidural steroid injections.  Plan to update the MRI to plan for injections.  This is a new medical complaint.  The differential and prognosis is somewhat  uncertain.  Additionally Ms. Dano has a history of vasculopathy.  I think it is reasonable to check a bilateral lower extremity ABI to check for the possibility of claudication explaining her calf pain.    Orders Placed This Encounter  Procedures  . MR Lumbar Spine Wo Contrast    Standing Status:   Future    Standing Expiration Date:   12/23/2018    Order Specific Question:   What is the patient's sedation requirement?    Answer:   No Sedation    Order Specific Question:   Does the patient have a pacemaker or implanted devices?    Answer:  No    Order Specific Question:   Preferred imaging location?    Answer:   Product/process development scientist (table limit-350lbs)    Order Specific Question:   Radiology Contrast Protocol - do NOT remove file path    Answer:   [email or web address]   Meds ordered this encounter  Medications  . diclofenac sodium (VOLTAREN) 1 % GEL    Sig: Apply 4 g topically 4 (four) times daily. To affected joint.    Dispense:  100 g    Refill:  11    Discussed warning signs or symptoms. Please see discharge instructions. Patient expresses understanding.

## 2017-10-25 ENCOUNTER — Ambulatory Visit: Payer: Medicare Other | Admitting: Family Medicine

## 2017-10-25 ENCOUNTER — Ambulatory Visit (HOSPITAL_COMMUNITY)
Admission: RE | Admit: 2017-10-25 | Discharge: 2017-10-25 | Disposition: A | Discharge status: Home / Self Care | Payer: Medicare Other | Source: Ambulatory Visit | Attending: Vascular Surgery | Admitting: Vascular Surgery

## 2017-10-25 DIAGNOSIS — I10 Essential (primary) hypertension: Secondary | ICD-10-CM | POA: Insufficient documentation

## 2017-10-25 DIAGNOSIS — M79605 Pain in left leg: Secondary | ICD-10-CM | POA: Diagnosis not present

## 2017-10-25 DIAGNOSIS — M79604 Pain in right leg: Secondary | ICD-10-CM

## 2017-10-25 DIAGNOSIS — R936 Abnormal findings on diagnostic imaging of limbs: Secondary | ICD-10-CM | POA: Diagnosis not present

## 2017-10-25 DIAGNOSIS — E119 Type 2 diabetes mellitus without complications: Secondary | ICD-10-CM | POA: Insufficient documentation

## 2017-10-27 ENCOUNTER — Telehealth: Payer: Self-pay | Admitting: Family Medicine

## 2017-10-29 ENCOUNTER — Other Ambulatory Visit: Payer: Self-pay | Admitting: Osteopathic Medicine

## 2017-11-02 ENCOUNTER — Other Ambulatory Visit: Payer: Self-pay | Admitting: Osteopathic Medicine

## 2017-11-02 DIAGNOSIS — R6889 Other general symptoms and signs: Secondary | ICD-10-CM

## 2017-11-02 NOTE — Progress Notes (Signed)
Referral to vein/vasc

## 2017-11-04 NOTE — Telephone Encounter (Signed)
Note opened in error.

## 2017-11-07 ENCOUNTER — Other Ambulatory Visit: Payer: Self-pay

## 2017-11-07 DIAGNOSIS — M79605 Pain in left leg: Principal | ICD-10-CM

## 2017-11-07 DIAGNOSIS — R6889 Other general symptoms and signs: Secondary | ICD-10-CM

## 2017-11-07 DIAGNOSIS — M79604 Pain in right leg: Secondary | ICD-10-CM

## 2017-11-07 DIAGNOSIS — R1310 Dysphagia, unspecified: Secondary | ICD-10-CM | POA: Diagnosis not present

## 2017-11-07 DIAGNOSIS — J339 Nasal polyp, unspecified: Secondary | ICD-10-CM | POA: Diagnosis not present

## 2017-11-10 DIAGNOSIS — R109 Unspecified abdominal pain: Secondary | ICD-10-CM | POA: Diagnosis not present

## 2017-11-10 DIAGNOSIS — J441 Chronic obstructive pulmonary disease with (acute) exacerbation: Secondary | ICD-10-CM | POA: Diagnosis not present

## 2017-11-10 DIAGNOSIS — R918 Other nonspecific abnormal finding of lung field: Secondary | ICD-10-CM | POA: Diagnosis not present

## 2017-11-10 DIAGNOSIS — I517 Cardiomegaly: Secondary | ICD-10-CM | POA: Diagnosis not present

## 2017-11-10 DIAGNOSIS — J9601 Acute respiratory failure with hypoxia: Secondary | ICD-10-CM | POA: Diagnosis not present

## 2017-11-10 DIAGNOSIS — R1312 Dysphagia, oropharyngeal phase: Secondary | ICD-10-CM | POA: Diagnosis not present

## 2017-11-10 DIAGNOSIS — R1111 Vomiting without nausea: Secondary | ICD-10-CM | POA: Diagnosis not present

## 2017-11-10 DIAGNOSIS — I5033 Acute on chronic diastolic (congestive) heart failure: Secondary | ICD-10-CM | POA: Diagnosis not present

## 2017-11-10 DIAGNOSIS — Z96642 Presence of left artificial hip joint: Secondary | ICD-10-CM | POA: Diagnosis not present

## 2017-11-10 DIAGNOSIS — E1122 Type 2 diabetes mellitus with diabetic chronic kidney disease: Secondary | ICD-10-CM | POA: Diagnosis not present

## 2017-11-10 DIAGNOSIS — N281 Cyst of kidney, acquired: Secondary | ICD-10-CM | POA: Diagnosis not present

## 2017-11-10 DIAGNOSIS — N183 Chronic kidney disease, stage 3 (moderate): Secondary | ICD-10-CM | POA: Diagnosis not present

## 2017-11-10 DIAGNOSIS — R1012 Left upper quadrant pain: Secondary | ICD-10-CM | POA: Diagnosis not present

## 2017-11-10 DIAGNOSIS — R0602 Shortness of breath: Secondary | ICD-10-CM | POA: Diagnosis not present

## 2017-11-10 DIAGNOSIS — R1011 Right upper quadrant pain: Secondary | ICD-10-CM | POA: Diagnosis not present

## 2017-11-10 DIAGNOSIS — I130 Hypertensive heart and chronic kidney disease with heart failure and stage 1 through stage 4 chronic kidney disease, or unspecified chronic kidney disease: Secondary | ICD-10-CM | POA: Diagnosis not present

## 2017-11-10 DIAGNOSIS — I10 Essential (primary) hypertension: Secondary | ICD-10-CM | POA: Diagnosis not present

## 2017-11-10 DIAGNOSIS — R590 Localized enlarged lymph nodes: Secondary | ICD-10-CM | POA: Diagnosis not present

## 2017-11-11 DIAGNOSIS — Z794 Long term (current) use of insulin: Secondary | ICD-10-CM | POA: Diagnosis not present

## 2017-11-11 DIAGNOSIS — N281 Cyst of kidney, acquired: Secondary | ICD-10-CM | POA: Diagnosis not present

## 2017-11-11 DIAGNOSIS — R590 Localized enlarged lymph nodes: Secondary | ICD-10-CM | POA: Diagnosis not present

## 2017-11-11 DIAGNOSIS — E1122 Type 2 diabetes mellitus with diabetic chronic kidney disease: Secondary | ICD-10-CM | POA: Diagnosis not present

## 2017-11-11 DIAGNOSIS — R1313 Dysphagia, pharyngeal phase: Secondary | ICD-10-CM | POA: Diagnosis present

## 2017-11-11 DIAGNOSIS — I10 Essential (primary) hypertension: Secondary | ICD-10-CM | POA: Diagnosis not present

## 2017-11-11 DIAGNOSIS — Z96611 Presence of right artificial shoulder joint: Secondary | ICD-10-CM | POA: Diagnosis present

## 2017-11-11 DIAGNOSIS — E039 Hypothyroidism, unspecified: Secondary | ICD-10-CM | POA: Diagnosis not present

## 2017-11-11 DIAGNOSIS — I340 Nonrheumatic mitral (valve) insufficiency: Secondary | ICD-10-CM | POA: Diagnosis present

## 2017-11-11 DIAGNOSIS — R0602 Shortness of breath: Secondary | ICD-10-CM | POA: Diagnosis not present

## 2017-11-11 DIAGNOSIS — G301 Alzheimer's disease with late onset: Secondary | ICD-10-CM | POA: Diagnosis not present

## 2017-11-11 DIAGNOSIS — I878 Other specified disorders of veins: Secondary | ICD-10-CM | POA: Diagnosis not present

## 2017-11-11 DIAGNOSIS — I130 Hypertensive heart and chronic kidney disease with heart failure and stage 1 through stage 4 chronic kidney disease, or unspecified chronic kidney disease: Secondary | ICD-10-CM | POA: Diagnosis present

## 2017-11-11 DIAGNOSIS — R109 Unspecified abdominal pain: Secondary | ICD-10-CM | POA: Diagnosis not present

## 2017-11-11 DIAGNOSIS — I272 Pulmonary hypertension, unspecified: Secondary | ICD-10-CM | POA: Diagnosis present

## 2017-11-11 DIAGNOSIS — J9601 Acute respiratory failure with hypoxia: Secondary | ICD-10-CM | POA: Diagnosis present

## 2017-11-11 DIAGNOSIS — R1011 Right upper quadrant pain: Secondary | ICD-10-CM | POA: Diagnosis not present

## 2017-11-11 DIAGNOSIS — R05 Cough: Secondary | ICD-10-CM | POA: Diagnosis not present

## 2017-11-11 DIAGNOSIS — Z96653 Presence of artificial knee joint, bilateral: Secondary | ICD-10-CM | POA: Diagnosis present

## 2017-11-11 DIAGNOSIS — Z955 Presence of coronary angioplasty implant and graft: Secondary | ICD-10-CM | POA: Diagnosis not present

## 2017-11-11 DIAGNOSIS — Z881 Allergy status to other antibiotic agents status: Secondary | ICD-10-CM | POA: Diagnosis not present

## 2017-11-11 DIAGNOSIS — Z951 Presence of aortocoronary bypass graft: Secondary | ICD-10-CM | POA: Diagnosis not present

## 2017-11-11 DIAGNOSIS — I083 Combined rheumatic disorders of mitral, aortic and tricuspid valves: Secondary | ICD-10-CM | POA: Diagnosis not present

## 2017-11-11 DIAGNOSIS — I2510 Atherosclerotic heart disease of native coronary artery without angina pectoris: Secondary | ICD-10-CM | POA: Diagnosis present

## 2017-11-11 DIAGNOSIS — Z96642 Presence of left artificial hip joint: Secondary | ICD-10-CM | POA: Diagnosis not present

## 2017-11-11 DIAGNOSIS — I5032 Chronic diastolic (congestive) heart failure: Secondary | ICD-10-CM | POA: Diagnosis not present

## 2017-11-11 DIAGNOSIS — R918 Other nonspecific abnormal finding of lung field: Secondary | ICD-10-CM | POA: Diagnosis not present

## 2017-11-11 DIAGNOSIS — R1310 Dysphagia, unspecified: Secondary | ICD-10-CM | POA: Diagnosis not present

## 2017-11-11 DIAGNOSIS — N183 Chronic kidney disease, stage 3 (moderate): Secondary | ICD-10-CM | POA: Diagnosis not present

## 2017-11-11 DIAGNOSIS — Z888 Allergy status to other drugs, medicaments and biological substances status: Secondary | ICD-10-CM | POA: Diagnosis not present

## 2017-11-11 DIAGNOSIS — I071 Rheumatic tricuspid insufficiency: Secondary | ICD-10-CM | POA: Diagnosis present

## 2017-11-11 DIAGNOSIS — I517 Cardiomegaly: Secondary | ICD-10-CM | POA: Diagnosis not present

## 2017-11-11 DIAGNOSIS — I482 Chronic atrial fibrillation: Secondary | ICD-10-CM | POA: Diagnosis present

## 2017-11-11 DIAGNOSIS — R1012 Left upper quadrant pain: Secondary | ICD-10-CM | POA: Diagnosis not present

## 2017-11-11 DIAGNOSIS — R071 Chest pain on breathing: Secondary | ICD-10-CM | POA: Diagnosis not present

## 2017-11-11 DIAGNOSIS — R1111 Vomiting without nausea: Secondary | ICD-10-CM | POA: Diagnosis not present

## 2017-11-11 DIAGNOSIS — Z7901 Long term (current) use of anticoagulants: Secondary | ICD-10-CM | POA: Diagnosis not present

## 2017-11-11 DIAGNOSIS — J432 Centrilobular emphysema: Secondary | ICD-10-CM | POA: Diagnosis not present

## 2017-11-11 DIAGNOSIS — J441 Chronic obstructive pulmonary disease with (acute) exacerbation: Secondary | ICD-10-CM | POA: Diagnosis not present

## 2017-11-11 DIAGNOSIS — I5033 Acute on chronic diastolic (congestive) heart failure: Secondary | ICD-10-CM | POA: Diagnosis present

## 2017-11-11 DIAGNOSIS — F0280 Dementia in other diseases classified elsewhere without behavioral disturbance: Secondary | ICD-10-CM | POA: Diagnosis not present

## 2017-11-11 DIAGNOSIS — E1142 Type 2 diabetes mellitus with diabetic polyneuropathy: Secondary | ICD-10-CM | POA: Diagnosis present

## 2017-11-11 DIAGNOSIS — I4892 Unspecified atrial flutter: Secondary | ICD-10-CM | POA: Diagnosis not present

## 2017-11-14 MED ORDER — BUDESONIDE-FORMOTEROL FUMARATE 160-4.5 MCG/ACT IN AERO
INHALATION_SPRAY | RESPIRATORY_TRACT | Status: DC
Start: 2017-11-14 — End: 2017-11-14

## 2017-11-14 MED ORDER — PANTOPRAZOLE SODIUM 40 MG PO TBEC
40.00 | DELAYED_RELEASE_TABLET | ORAL | Status: DC
Start: 2017-11-15 — End: 2017-11-14

## 2017-11-14 MED ORDER — DOCUSATE SODIUM 100 MG PO CAPS
100.00 | ORAL_CAPSULE | ORAL | Status: DC
Start: 2017-11-14 — End: 2017-11-14

## 2017-11-14 MED ORDER — ATORVASTATIN CALCIUM 80 MG PO TABS
80.00 | ORAL_TABLET | ORAL | Status: DC
Start: 2017-11-15 — End: 2017-11-14

## 2017-11-14 MED ORDER — IPRATROPIUM-ALBUTEROL 0.5-2.5 (3) MG/3ML IN SOLN
3.00 | RESPIRATORY_TRACT | Status: DC
Start: ? — End: 2017-11-14

## 2017-11-14 MED ORDER — APIXABAN 5 MG PO TABS
5.00 | ORAL_TABLET | ORAL | Status: DC
Start: 2017-11-14 — End: 2017-11-14

## 2017-11-14 MED ORDER — HYDRALAZINE HCL 20 MG/ML IJ SOLN
10.00 | INTRAMUSCULAR | Status: DC
Start: ? — End: 2017-11-14

## 2017-11-14 MED ORDER — INSULIN LISPRO 100 UNIT/ML ~~LOC~~ SOLN
SUBCUTANEOUS | Status: DC
Start: ? — End: 2017-11-14

## 2017-11-14 MED ORDER — NITROGLYCERIN 0.4 MG SL SUBL
0.40 | SUBLINGUAL_TABLET | SUBLINGUAL | Status: DC
Start: ? — End: 2017-11-14

## 2017-11-14 MED ORDER — ESCITALOPRAM OXALATE 10 MG PO TABS
5.00 | ORAL_TABLET | ORAL | Status: DC
Start: 2017-11-15 — End: 2017-11-14

## 2017-11-14 MED ORDER — LEVOTHYROXINE SODIUM 88 MCG PO TABS
ORAL_TABLET | ORAL | Status: DC
Start: 2017-11-15 — End: 2017-11-14

## 2017-11-14 MED ORDER — GENERIC EXTERNAL MEDICATION
Status: DC
Start: ? — End: 2017-11-14

## 2017-11-14 MED ORDER — MONTELUKAST SODIUM 10 MG PO TABS
10.00 | ORAL_TABLET | ORAL | Status: DC
Start: 2017-11-14 — End: 2017-11-14

## 2017-11-14 MED ORDER — CARVEDILOL 12.5 MG PO TABS
12.50 | ORAL_TABLET | ORAL | Status: DC
Start: 2017-11-14 — End: 2017-11-14

## 2017-11-14 MED ORDER — INSULIN GLARGINE 100 UNIT/ML ~~LOC~~ SOLN
SUBCUTANEOUS | Status: DC
Start: 2017-11-14 — End: 2017-11-14

## 2017-11-14 MED ORDER — SODIUM CHLORIDE 0.9 % IV SOLN
INTRAVENOUS | Status: DC
Start: ? — End: 2017-11-14

## 2017-11-14 MED ORDER — ACETAMINOPHEN 325 MG PO TABS
650.00 | ORAL_TABLET | ORAL | Status: DC
Start: ? — End: 2017-11-14

## 2017-11-14 MED ORDER — INSULIN LISPRO 100 UNIT/ML ~~LOC~~ SOLN
SUBCUTANEOUS | Status: DC
Start: 2017-11-14 — End: 2017-11-14

## 2017-11-14 MED ORDER — GABAPENTIN 300 MG PO CAPS
300.00 | ORAL_CAPSULE | ORAL | Status: DC
Start: 2017-11-14 — End: 2017-11-14

## 2017-11-14 MED ORDER — ALBUTEROL SULFATE (2.5 MG/3ML) 0.083% IN NEBU
2.50 | INHALATION_SOLUTION | RESPIRATORY_TRACT | Status: DC
Start: ? — End: 2017-11-14

## 2017-11-14 MED ORDER — POLYETHYLENE GLYCOL 3350 17 G PO PACK
17.00 | PACK | ORAL | Status: DC
Start: ? — End: 2017-11-14

## 2017-11-16 MED ORDER — GENERIC EXTERNAL MEDICATION
Status: DC
Start: ? — End: 2017-11-16

## 2017-11-30 ENCOUNTER — Encounter (HOSPITAL_COMMUNITY): Payer: Medicare Other

## 2017-11-30 ENCOUNTER — Encounter: Payer: Medicare Other | Admitting: Vascular Surgery

## 2017-11-30 ENCOUNTER — Other Ambulatory Visit: Payer: Self-pay | Admitting: Osteopathic Medicine

## 2017-11-30 DIAGNOSIS — B0223 Postherpetic polyneuropathy: Secondary | ICD-10-CM

## 2017-12-27 DIAGNOSIS — I10 Essential (primary) hypertension: Secondary | ICD-10-CM | POA: Diagnosis not present

## 2017-12-27 DIAGNOSIS — R1312 Dysphagia, oropharyngeal phase: Secondary | ICD-10-CM | POA: Diagnosis not present

## 2017-12-27 DIAGNOSIS — E039 Hypothyroidism, unspecified: Secondary | ICD-10-CM | POA: Diagnosis not present

## 2017-12-27 DIAGNOSIS — R05 Cough: Secondary | ICD-10-CM | POA: Diagnosis not present

## 2017-12-27 DIAGNOSIS — J449 Chronic obstructive pulmonary disease, unspecified: Secondary | ICD-10-CM | POA: Diagnosis not present

## 2017-12-27 DIAGNOSIS — E1142 Type 2 diabetes mellitus with diabetic polyneuropathy: Secondary | ICD-10-CM | POA: Diagnosis not present

## 2017-12-27 DIAGNOSIS — I4891 Unspecified atrial fibrillation: Secondary | ICD-10-CM | POA: Diagnosis not present

## 2017-12-27 DIAGNOSIS — R1310 Dysphagia, unspecified: Secondary | ICD-10-CM | POA: Diagnosis not present

## 2017-12-31 ENCOUNTER — Other Ambulatory Visit: Payer: Self-pay | Admitting: Osteopathic Medicine

## 2018-01-30 ENCOUNTER — Encounter: Payer: Self-pay | Admitting: Osteopathic Medicine

## 2018-01-30 ENCOUNTER — Ambulatory Visit (INDEPENDENT_AMBULATORY_CARE_PROVIDER_SITE_OTHER): Payer: Medicare Other | Admitting: Osteopathic Medicine

## 2018-01-30 VITALS — BP 149/59 | HR 79 | Wt 158.2 lb

## 2018-01-30 DIAGNOSIS — Z794 Long term (current) use of insulin: Secondary | ICD-10-CM

## 2018-01-30 DIAGNOSIS — M79604 Pain in right leg: Secondary | ICD-10-CM

## 2018-01-30 DIAGNOSIS — I70219 Atherosclerosis of native arteries of extremities with intermittent claudication, unspecified extremity: Secondary | ICD-10-CM

## 2018-01-30 DIAGNOSIS — E0822 Diabetes mellitus due to underlying condition with diabetic chronic kidney disease: Secondary | ICD-10-CM | POA: Diagnosis not present

## 2018-01-30 DIAGNOSIS — N183 Chronic kidney disease, stage 3 unspecified: Secondary | ICD-10-CM

## 2018-01-30 DIAGNOSIS — R1310 Dysphagia, unspecified: Secondary | ICD-10-CM | POA: Diagnosis not present

## 2018-01-30 DIAGNOSIS — I270 Primary pulmonary hypertension: Secondary | ICD-10-CM | POA: Diagnosis not present

## 2018-01-30 DIAGNOSIS — M79605 Pain in left leg: Secondary | ICD-10-CM

## 2018-01-30 NOTE — Progress Notes (Signed)
HPI: Briana Werner is a 82 y.o. female who  has a past medical history of Asthma, Atrial flutter ([place]), CAD (coronary artery disease), Cerebrovascular disease, Chronic renal insufficiency (02/26/2014), COPD (chronic obstructive pulmonary disease) ([place]) (02/26/2014), Hypothyroid (02/26/2014), Presence of stent in coronary artery in patient with coronary artery disease (02/26/2014), Renal artery stenosis ([place]), and Type 2 diabetes mellitus ([place]) (02/26/2014).  she presents to [facility] today, 01/30/18,  for chief complaint of:  Swallowing issues  Abnormal ABI DM2  LE doppler 10/2017 abn ABI (unreliable d/t calcifications arterial wall) and Dr Georgina Snell made a referral to vascular but she had a panic attack driving to [facility] and refuses to travel now to see the specialist.   Swallowing issues - barium swallow a few times through [facility]. On hospital note: "outpt barium swallow did reveal oral pharyngeal dysphagia with poor bolus transfer and early spillage. There was noted to be a possible mass along the anterior wall of the hypopharynx at C4. Normal caliber esophagus and normal esophageal motility" ENT follow-up was scheduled for 11/21/17, I don't have records available. She states ENT was wanting to do more tests or schedule her w/ speech therapy but she states she has done enough of ST and testing, wants to know why keeps choking, only seems to happen with thin liquids, she hasn't tried thickening liquids.   Breathing/COPD - hospitalized for exacerbation/SOB 11/11/2017 found more likely a pulmonary HTN issue, improved w/ diuresis, TTE showed EF 50-55% severe pulm HTN.   CHF/Pulm HTN: previously following with Dr Stanford Breed, has been some time since she was seen by him, 04/2017. Due for recheck with him, looks like she cancelled a 11/30/17 appt.   DM2: A1C today shows decent control at 7.9%     Past medical history, surgical history, and family history reviewed.   Current medication list and allergy/intolerance information reviewed.   (See remainder of HPI, ROS, Phys Exam below)    ASSESSMENT/PLAN:   Leg pain, bilateral - advised controlling cholesterol and BP, getting more walking exercise will help, but ideally she should get a ride to see vascular  Extremity atherosclerosis with intermittent claudication (HCC)  Diabetes mellitus due to underlying condition with stage 3 chronic kidney disease, with long-term current use of insulin (HCC)  Dysphagia, unspecified type - advised thickened liquids, not much more I con do from my standpoint if she isn't following ENT advise, would consider GI referral for 2nd opinion   Pulmonary hypertension, primary [facility]) - advised her to reschedule with Dr Stanford Breed.      Patient Instructions   Try thickening liquids - see below for instructions   Would recommend seeing ENT again to revisit the swallowing issue  As a last resort, a GI doctor can place a feeding tube directly into the stomach    For legs, if we need a specialist, you will have to travel to [place]. We can try to arrange transportation for you, or you can ask someone for a ride.     I recommend calling Dr Stanford Breed to schedule a follow-up, you are over due for this.      Thickening Liquids for Dysphagia Diet If you are on the dysphagia diet, you may need to thicken drinks, soups, foods that melt at room temperature, and other liquids before you drink or eat them. Thickening liquids makes them easier to swallow. It also reduces the risk of liquid traveling to your lungs. To make a thickened liquid you will need to add a  commercial thickening product or a soft food to the liquid until it reaches the consistency it needs to be. Your health care provider or dietitian will explain to you the consistency you need to aim for. Liquid consistencies include:  Thin. Thin liquids include most drinks (such as water, milk, tea, soda, juice,  carbonated drinks), as well as ice cream, sherbet, sorbet, ice pops, and broth-based soups.  Nectar-like. Nectar-like liquids include maple syrup and creamy soup.  Honey-like. Honey-like liquids are made to be runny but are thick like honey. They cannot be sipped through a straw.  Spoon-thick. Spoon-thick liquids are thick, like pudding.  My plan I should thicken my liquids to a nectar-like consistency. Diet guidelines  Thicken liquids to the consistency your health care provider recommends.  Follow your dietitian's or health care provider's recommendation on how to thicken your liquids.  See your dietitian or health care provider regularly for help with your dietary changes. How can I thicken my liquids? Liquids can be thickened with a commercial food and beverage thickener or with a soft food. Equities trader Thickeners A food and beverage thickener is a powder or gel that makes a food or beverage thicker. Thickeners are sold at pharmacies, medical supply stores, some grocery stores, and online. They can be added to both hot and cold liquids and do not change the taste of the liquid. Ask your health care provider or dietitian for a complete list of commercial thickeners. Each thickening product is different. Some need to be blended into a liquid with a blender while others can be stirred into a liquid with a fork or spoon. Follow the instructions on the product label. Soft Foods Some foods such as soups, casseroles, and gravies can be thickened with soft foods. Soft foods include:  Baby cereal.  Gravy powder.  Mashed potato.  Pureed baby food.  Instant potato flakes.  Powdered sauce mixes (such as cheese mixes).  Flour.  To use one of these soft food items, stir or mix them into the thin liquid until it reaches the desired thickness. Start with a small amount and adjust soft food and liquid as necessary. Note: Flour works best with warm liquids, such as broth.  To thicken a liquid with flour, make a paste out of flour and water. Cook or warm your liquid and add the paste to it. Stir until the mixture thickens. What are some tips to make thickening liquids easier?  Take thickeners with you when eating out or traveling.  If a liquid gets too thick, add more of the thinner liquid until the desired consistency is reached.  Consider purchasing pre-made thickened drinks.  Consider using a thickening product to make your own frozen desserts. This information is not intended to replace advice given to you by your health care provider. Make sure you discuss any questions you have with your health care provider. Document Released: 02/29/2012 Document Revised: 02/05/2016 Document Reviewed: 08/13/2013 Elsevier Interactive Patient Education  2017 [employer].    Follow-up plan: Return in about 3 months (around 05/02/2018) for recheck sugars, other chronic issues (OV40) .     ############################################ ############################################ ############################################ ############################################    Outpatient Encounter Medications as of 01/30/2018  Medication Sig Note  . benazepril (LOTENSIN) 5 MG tablet TAKE 1 TABLET(5 MG) BY MOUTH DAILY   . Calcium Carbonate-Vitamin D (CALTRATE 600+D PO) Take by mouth 2 (two) times daily.   . carvedilol (COREG) 12.5 MG tablet Take 1 tablet (12.5 mg total) by mouth  2 (two) times daily with a meal.   . diclofenac sodium (VOLTAREN) 1 % GEL Apply 4 g topically 4 (four) times daily. To affected joint.   Marland Kitchen ELIQUIS 5 MG TABS tablet TAKE 1 TABLET(5 MG) BY MOUTH TWICE DAILY   . escitalopram (LEXAPRO) 5 MG tablet TAKE 1 TABLET BY MOUTH DAILY   . FeFum-FePoly-FA-B Cmp-C-Biot (INTEGRA PLUS) CAPS TAKE 1 CAPSULE BY MOUTH DAILY   . Fluticasone-Salmeterol (ADVAIR DISKUS) 500-50 MCG/DOSE AEPB Inhale 1 puff into the lungs 2 (two) times daily.   Marland Kitchen gabapentin (NEURONTIN) 300  MG capsule TAKE 1 CAPSULE(300 MG) BY MOUTH THREE TIMES DAILY   . glucose blood (ONE TOUCH ULTRA TEST) test strip Check blood sugar up to 4 times daily. Dx DM ICD 10 E11.9 insulin dependent.   . Insulin Glargine (TOUJEO SOLOSTAR) 300 UNIT/ML SOPN Inject 10-15 Units into the skin daily.   . Insulin Pen Needle (BD PEN NEEDLE NANO U/F) 32G X 4 MM MISC Inject into skin once daily. Use with insulin pen. DX DM ICD-10 E11.22   . ipratropium-albuterol (DUONEB) 0.5-2.5 (3) MG/3ML SOLN Take 3 mLs by nebulization every 2 (two) hours as needed (wheeze, SOB, cough).   . IRON PO Take by mouth.   Marland Kitchen JANUVIA 50 MG tablet TAKE 1 TABLET BY MOUTH EVERY DAY   . levothyroxine (SYNTHROID, LEVOTHROID) 88 MCG tablet TAKE 1 TABLET BY MOUTH DAILY BEFORE BREAKFAST   . montelukast (SINGULAIR) 10 MG tablet TAKE 1 TABLET BY MOUTH AT BEDTIME   . montelukast (SINGULAIR) 10 MG tablet TAKE 1 TABLET BY MOUTH AT BEDTIME   . Multiple Vitamin (MULTIVITAMIN) capsule Take 1 capsule by mouth. 06/26/2014: Received from: [facility]  . omeprazole (PRILOSEC) 20 MG capsule TAKE 1 CAPSULE BY MOUTH TWICE DAILY   . pantoprazole (PROTONIX) 40 MG tablet TAKE 1 TABLET BY MOUTH DAILY    No facility-administered encounter medications on file as of 01/30/2018.    Allergies  Allergen Reactions  . Tetracycline Shortness Of Breath  . Tetracyclines & Related Anaphylaxis and Shortness Of Breath    dizziness  . Latex Dermatitis    Bandaids  Bandages, pulls skin  . Other Dermatitis    Bandaids   . Sulfamethoxazole-Trimethoprim Other (See Comments)    dizzy  . Amlodipine     Edema  . Amoxicillin     Questionable rash  . Tape Dermatitis    Bandaids  . Sulfa Antibiotics Rash      Review of Systems:  Constitutional: No recent illness  HEENT: No  headache, no vision change  Cardiac: No  chest pain, No  pressure, No palpitations  Respiratory:  No  shortness of breath. +Cough/choking as per HPI  Gastrointestinal: No  abdominal pain, no  change on bowel habits  Musculoskeletal: No new myalgia/arthralgia  Skin: No  Rash   Exam:  BP (!) 149/59 (BP Location: Left Arm, Patient Position: Sitting, Cuff Size: Normal)   Pulse 79   Wt 158 lb 3.2 oz (71.8 kg)   LMP  (LMP Unknown)   BMI 24.78 kg/m   Constitutional: VS see above. General Appearance: alert, well-developed, well-nourished, NAD  Eyes: Normal lids and conjunctive, non-icteric sclera  Ears, Nose, Mouth, Throat: MMM, Normal external inspection ears/nares/mouth/lips/gums.  Neck: No masses, trachea midline.   Respiratory: Normal respiratory effort. no wheeze, no rhonchi, no rales  Cardiovascular: S1/S2 normal, no murmur, no rub/gallop auscultated. irreg/irreg.   Musculoskeletal: Gait normal. Symmetric and independent movement of all extremities, ambulates well w/ walker  Neurological: Normal balance/coordination. No tremor.  Skin: warm, dry, intact.   Psychiatric: Fair judgment/insight. Normal mood and affect. Oriented x3.   Visit summary with medication list and pertinent instructions was printed for patient to review, advised to alert [place] if any changes needed. All questions at time of visit were answered - patient instructed to contact office with any additional concerns. ER/RTC precautions were reviewed with the patient and understanding verbalized.   Follow-up plan: Return in about 3 months (around 05/02/2018) for recheck sugars, other chronic issues (OV40) .  Note: Total time spent 40 minutes, greater than 50% of the visit was spent face-to-face counseling and coordinating care for the following: The primary encounter diagnosis was Leg pain, bilateral. Diagnoses of Extremity atherosclerosis with intermittent claudication ([place]), Diabetes mellitus due to underlying condition with stage 3 chronic kidney disease, with long-term current use of insulin ([place]), Dysphagia, unspecified type, and Pulmonary hypertension, primary ([place]) were also pertinent to this  visit.Marland Kitchen  Please note: voice recognition software was used to produce this document, and typos may escape review. Please contact Dr. Sheppard Coil for any needed clarifications.

## 2018-01-30 NOTE — Patient Instructions (Signed)
Try thickening liquids - see below for instructions   Would recommend seeing ENT again to revisit the swallowing issue  As a last resort, a GI doctor can place a feeding tube directly into the stomach    For legs, if we need a specialist, you will have to travel to [place]. We can try to arrange transportation for you, or you can ask someone for a ride.     I recommend calling Dr Stanford Breed to schedule a follow-up, you are over due for this.      Thickening Liquids for Dysphagia Diet If you are on the dysphagia diet, you may need to thicken drinks, soups, foods that melt at room temperature, and other liquids before you drink or eat them. Thickening liquids makes them easier to swallow. It also reduces the risk of liquid traveling to your lungs. To make a thickened liquid you will need to add a commercial thickening product or a soft food to the liquid until it reaches the consistency it needs to be. Your health care provider or dietitian will explain to you the consistency you need to aim for. Liquid consistencies include:  Thin. Thin liquids include most drinks (such as water, milk, tea, soda, juice, carbonated drinks), as well as ice cream, sherbet, sorbet, ice pops, and broth-based soups.  Nectar-like. Nectar-like liquids include maple syrup and creamy soup.  Honey-like. Honey-like liquids are made to be runny but are thick like honey. They cannot be sipped through a straw.  Spoon-thick. Spoon-thick liquids are thick, like pudding.  My plan I should thicken my liquids to a nectar-like consistency. Diet guidelines  Thicken liquids to the consistency your health care provider recommends.  Follow your dietitian's or health care provider's recommendation on how to thicken your liquids.  See your dietitian or health care provider regularly for help with your dietary changes. How can I thicken my liquids? Liquids can be thickened with a commercial food and beverage thickener  or with a soft food. Equities trader Thickeners A food and beverage thickener is a powder or gel that makes a food or beverage thicker. Thickeners are sold at pharmacies, medical supply stores, some grocery stores, and online. They can be added to both hot and cold liquids and do not change the taste of the liquid. Ask your health care provider or dietitian for a complete list of commercial thickeners. Each thickening product is different. Some need to be blended into a liquid with a blender while others can be stirred into a liquid with a fork or spoon. Follow the instructions on the product label. Soft Foods Some foods such as soups, casseroles, and gravies can be thickened with soft foods. Soft foods include:  Baby cereal.  Gravy powder.  Mashed potato.  Pureed baby food.  Instant potato flakes.  Powdered sauce mixes (such as cheese mixes).  Flour.  To use one of these soft food items, stir or mix them into the thin liquid until it reaches the desired thickness. Start with a small amount and adjust soft food and liquid as necessary. Note: Flour works best with warm liquids, such as broth. To thicken a liquid with flour, make a paste out of flour and water. Cook or warm your liquid and add the paste to it. Stir until the mixture thickens. What are some tips to make thickening liquids easier?  Take thickeners with you when eating out or traveling.  If a liquid gets too thick, add more of the thinner liquid  until the desired consistency is reached.  Consider purchasing pre-made thickened drinks.  Consider using a thickening product to make your own frozen desserts. This information is not intended to replace advice given to you by your health care provider. Make sure you discuss any questions you have with your health care provider. Document Released: 02/29/2012 Document Revised: 02/05/2016 Document Reviewed: 08/13/2013 Elsevier Interactive Patient Education  2017  [employer].

## 2018-02-06 ENCOUNTER — Other Ambulatory Visit: Payer: Self-pay | Admitting: Osteopathic Medicine

## 2018-02-20 ENCOUNTER — Other Ambulatory Visit: Payer: Self-pay | Admitting: Osteopathic Medicine

## 2018-02-20 DIAGNOSIS — B0223 Postherpetic polyneuropathy: Secondary | ICD-10-CM

## 2018-02-28 NOTE — Progress Notes (Signed)
HPI: FU coronary disease, cerebrovascular disease and renal artery stenosis. Previously resided in [place]. Patient underwent coronary artery bypassing graft in 2004. Records unavailable. She has had stents placed since then but none since 2006.  Cardiac catheterization at [facility] in 2015 showed an occluded LAD and RCA.  LIMA to the LAD was patent as well as the saphenous vein graft to the right coronary artery. Renal Dopplers in September 2015 showed greater than 60% left renal artery stenosis. There was an anechoic density in the right kidney. Dedicated renal ultrasound revealed 2.5 cm cyst in the upper pole of the right kidney. Carotid Dopplers March 2016 at [facility] showed no stenosis. Nuclear study April 2016 was normal.  Echocardiogram at [facility] in March 2019 showed normal LV function, mild aortic insufficiency, moderate mitral regurgitation, mild tricuspid regurgitation and moderate biatrial enlargement.  Severely elevated pulmonary pressure.  Since last seen,  patient describes episodes of coughing.  This is followed by shortness of breath.  She denies chest pain or syncope.  She has bilateral lower extremity pain with ambulation.  ABIs in February unreliable secondary to calcification.  Current Outpatient Medications  Medication Sig Dispense Refill  . benazepril (LOTENSIN) 5 MG tablet TAKE 1 TABLET(5 MG) BY MOUTH DAILY 90 tablet 1  . Calcium Carbonate-Vitamin D (CALTRATE 600+D PO) Take by mouth 2 (two) times daily.    . carvedilol (COREG) 12.5 MG tablet Take 1 tablet (12.5 mg total) by mouth 2 (two) times daily with a meal.    . diclofenac sodium (VOLTAREN) 1 % GEL Apply 4 g topically 4 (four) times daily. To affected joint. 100 g 11  . ELIQUIS 5 MG TABS tablet TAKE 1 TABLET(5 MG) BY MOUTH TWICE DAILY 60 tablet 0  . escitalopram (LEXAPRO) 5 MG tablet TAKE 1 TABLET BY MOUTH DAILY 90 tablet 0  . FeFum-FePoly-FA-B Cmp-C-Biot (INTEGRA PLUS) CAPS TAKE 1 CAPSULE BY MOUTH DAILY 90 capsule 0  .  Fluticasone-Salmeterol (ADVAIR DISKUS) 500-50 MCG/DOSE AEPB Inhale 1 puff into the lungs 2 (two) times daily. 60 each 3  . gabapentin (NEURONTIN) 300 MG capsule TAKE 1 CAPSULE(300 MG) BY MOUTH THREE TIMES DAILY 90 capsule 2  . glucose blood (ONE TOUCH ULTRA TEST) test strip Check blood sugar up to 4 times daily. Dx DM ICD 10 E11.9 insulin dependent. 200 each prn  . Insulin Glargine (TOUJEO SOLOSTAR) 300 UNIT/ML SOPN Inject 10-15 Units into the skin daily. 4.5 mL 6  . Insulin Pen Needle (BD PEN NEEDLE NANO U/F) 32G X 4 MM MISC Inject into skin once daily. Use with insulin pen. DX DM ICD-10 E11.22 100 each prn  . ipratropium-albuterol (DUONEB) 0.5-2.5 (3) MG/3ML SOLN Take 3 mLs by nebulization every 2 (two) hours as needed (wheeze, SOB, cough). 60 mL 1  . IRON PO Take by mouth.    Marland Kitchen JANUVIA 50 MG tablet TAKE 1 TABLET BY MOUTH EVERY DAY 90 tablet 0  . levothyroxine (SYNTHROID, LEVOTHROID) 88 MCG tablet TAKE 1 TABLET BY MOUTH DAILY BEFORE BREAKFAST 90 tablet 1  . montelukast (SINGULAIR) 10 MG tablet TAKE 1 TABLET BY MOUTH AT BEDTIME 30 tablet 0  . Multiple Vitamin (MULTIVITAMIN) capsule Take 1 capsule by mouth.    Marland Kitchen omeprazole (PRILOSEC) 20 MG capsule TAKE 1 CAPSULE BY MOUTH TWICE DAILY 60 capsule 0   No current facility-administered medications for this visit.      Past Medical History:  Diagnosis Date  . Asthma   . Atrial flutter ([place])   .  CAD (coronary artery disease)   . Cerebrovascular disease   . Chronic renal insufficiency 02/26/2014  . COPD (chronic obstructive pulmonary disease) ([place]) 02/26/2014  . Hypothyroid 02/26/2014  . Presence of stent in coronary artery in patient with coronary artery disease 02/26/2014  . Renal artery stenosis ([place])   . Type 2 diabetes mellitus ([place]) 02/26/2014    Past Surgical History:  Procedure Laterality Date  . ABDOMINAL HYSTERECTOMY    . APPENDECTOMY    . BACK SURGERY    . CHOLECYSTECTOMY    . CORONARY ARTERY BYPASS GRAFT     2004, [place]  . Hip  replacement    . KNEE SURGERY Bilateral   . NASAL SINUS SURGERY    . SHOULDER SURGERY    . TONSILLECTOMY      Social History   Socioeconomic History  . Marital status: Married    Spouse name: Not on file  . Number of children: 4  . Years of education: Not on file  . Highest education level: Not on file  Occupational History  . Not on file  Social Needs  . Financial resource strain: Not on file  . Food insecurity:    Worry: Not on file    Inability: Not on file  . Transportation needs:    Medical: Not on file    Non-medical: Not on file  Tobacco Use  . Smoking status: Never Smoker  . Smokeless tobacco: Never Used  Substance and Sexual Activity  . Alcohol use: No  . Drug use: No  . Sexual activity: Never  Lifestyle  . Physical activity:    Days per week: Not on file    Minutes per session: Not on file  . Stress: Not on file  Relationships  . Social connections:    Talks on phone: Not on file    Gets together: Not on file    Attends religious service: Not on file    Active member of club or organization: Not on file    Attends meetings of clubs or organizations: Not on file    Relationship status: Not on file  . Intimate partner violence:    Fear of current or ex partner: Not on file    Emotionally abused: Not on file    Physically abused: Not on file    Forced sexual activity: Not on file  Other Topics Concern  . Not on file  Social History Narrative  . Not on file    Family History  Problem Relation Age of Onset  . CAD Father   . Heart attack Father     ROS: no fevers or chills, productive cough, hemoptysis, dysphasia, odynophagia, melena, hematochezia, dysuria, hematuria, rash, seizure activity, orthopnea, PND, pedal edema, claudication. Remaining systems are negative.  Physical Exam: Well-developed well-nourished in no acute distress.  Skin is warm and dry.  HEENT is normal.  Neck is supple.  Chest is clear to auscultation with normal expansion.    Cardiovascular exam is regular rate and rhythm.  Abdominal exam nontender or distended. No masses palpated. Extremities show trace edema, erythema noted, diminished pulses. neuro grossly intact  A/P  1 coronary artery disease-patient is doing well from a symptomatic standpoint with no chest pain.  Continue medical therapy.  No aspirin given need for anticoagulation.  She is unclear whether she is taking cholesterol medications.  We will review and add statin if she is not.  2 atrial fibrillation/atrial flutter-plan to continue beta-blocker for rate control.  Continue apixaban.  Check renal function.  May need to decrease dose if creatinine greater than 1.5 and she will turn 82 in November.  3 hypertension-blood pressure is mildly elevated and she is complaining of cough.  Discontinue benazepril and add Cozaar 50 mg daily.  Check potassium and renal function in 1 week.  4 hyperlipidemia-we will contact patient and add statin if she is not taking.  5 history of renal artery stenosis-we will arrange follow-up renal Dopplers.  6 mitral regurgitation-moderate on last echocardiogram.  She will need follow-up study one year from previous (3/19).  7 chronic stage III renal disease-follow-up bmet 1 week.  8 probable peripheral vascular disease/claudication-ABIs were unreliable given calcification.  I will ask Dr. Gwenlyn Found to see.  Kirk Ruths, MD

## 2018-03-01 ENCOUNTER — Ambulatory Visit (INDEPENDENT_AMBULATORY_CARE_PROVIDER_SITE_OTHER): Payer: Medicare Other | Admitting: Cardiology

## 2018-03-01 ENCOUNTER — Encounter: Payer: Self-pay | Admitting: Cardiology

## 2018-03-01 VITALS — BP 146/58 | HR 76 | Ht 67.0 in | Wt 160.0 lb

## 2018-03-01 DIAGNOSIS — I701 Atherosclerosis of renal artery: Secondary | ICD-10-CM | POA: Diagnosis not present

## 2018-03-01 DIAGNOSIS — I2510 Atherosclerotic heart disease of native coronary artery without angina pectoris: Secondary | ICD-10-CM

## 2018-03-01 DIAGNOSIS — I10 Essential (primary) hypertension: Secondary | ICD-10-CM

## 2018-03-01 MED ORDER — LOSARTAN POTASSIUM 50 MG PO TABS: 50.0000 mg | ORAL_TABLET | Freq: Every day | ORAL | 3 refills | Status: DC

## 2018-03-01 NOTE — Patient Instructions (Signed)
Medication Instructions:   STOP BENAZEPRIL   START LOSARTAN 50 MG ONCE DAILY  Labwork:  Your physician recommends that you return for lab work in: [place]  Testing/Procedures:  Your physician has requested that you have a renal artery duplex. During this test, an ultrasound is used to evaluate blood flow to the kidneys. Allow one hour for this exam. Do not eat after midnight the day before and avoid carbonated beverages. Take your medications as you usually do.    Follow-Up:  Your physician wants you to follow-up in: [place] will receive a reminder letter in the mail two months in advance. If you don't receive a letter, please call our office to schedule the follow-up appointment.   REFERRAL TO DR BERRY FOR LEG PAIN

## 2018-03-02 ENCOUNTER — Telehealth: Payer: Self-pay | Admitting: *Deleted

## 2018-03-02 NOTE — Telephone Encounter (Signed)
Left message for pt dtr to call, patient was seen yesterday and was uncertain about her current medications and ask me to call her dtr to get a current list.

## 2018-03-09 NOTE — Telephone Encounter (Signed)
Spoke with pt dtr, all medications confirmed. The patient is also taking benazepril 5 mg once daily and losartan 50 mg once daily. She is complaining of a dry cough. Otherwise her medication list is correct. Will forward for dr Stanford Breed review

## 2018-03-09 NOTE — Telephone Encounter (Signed)
Spoke with pt dtr, aware to stop benazepril.

## 2018-03-09 NOTE — Telephone Encounter (Signed)
DC lotensin and follow BP Kirk Ruths

## 2018-03-15 DIAGNOSIS — I10 Essential (primary) hypertension: Secondary | ICD-10-CM | POA: Diagnosis not present

## 2018-03-16 LAB — BASIC METABOLIC PANEL
BUN/Creatinine Ratio: 23 (calc) — ABNORMAL HIGH (ref 6–22)
BUN: 37 mg/dL — ABNORMAL HIGH (ref 7–25)
CHLORIDE: 104 mmol/L (ref 98–110)
CO2: 26 mmol/L (ref 20–32)
Calcium: 9.5 mg/dL (ref 8.6–10.4)
Creat: 1.62 mg/dL — ABNORMAL HIGH (ref 0.60–0.93)
Glucose, Bld: 244 mg/dL — ABNORMAL HIGH (ref 65–99)
POTASSIUM: 4.9 mmol/L (ref 3.5–5.3)
Sodium: 139 mmol/L (ref 135–146)

## 2018-03-17 ENCOUNTER — Encounter: Payer: Self-pay | Admitting: Cardiovascular Disease

## 2018-03-17 ENCOUNTER — Ambulatory Visit (INDEPENDENT_AMBULATORY_CARE_PROVIDER_SITE_OTHER): Payer: Medicare Other | Admitting: Cardiovascular Disease

## 2018-03-17 ENCOUNTER — Other Ambulatory Visit: Payer: Self-pay | Admitting: *Deleted

## 2018-03-17 ENCOUNTER — Ambulatory Visit (HOSPITAL_COMMUNITY)
Admission: RE | Admit: 2018-03-17 | Discharge: 2018-03-17 | Disposition: A | Discharge status: Home / Self Care | Payer: Medicare Other | Source: Ambulatory Visit | Attending: Cardiovascular Disease | Admitting: Cardiovascular Disease

## 2018-03-17 ENCOUNTER — Encounter: Payer: Self-pay | Admitting: *Deleted

## 2018-03-17 VITALS — BP 184/85 | HR 77 | Ht 67.0 in | Wt 161.0 lb

## 2018-03-17 DIAGNOSIS — I701 Atherosclerosis of renal artery: Secondary | ICD-10-CM | POA: Diagnosis not present

## 2018-03-17 DIAGNOSIS — I739 Peripheral vascular disease, unspecified: Secondary | ICD-10-CM | POA: Insufficient documentation

## 2018-03-17 NOTE — Progress Notes (Signed)
03/17/2018 [name]   04-17-1936  [id number]  Primary Physician Emeterio Reeve, DO Primary Cardiologist: Lorretta Harp MD FACP, [place], [place], [place]  HPI:  Briana Werner is a 82 y.o. moderately overweight recently widowed (husband of 20 years died Apr 22, 2017) mother of 59, grandmother of 7 grandchildren referred by [name] for peripheral vascular evaluation because of claudication.  She does have a history of treated diabetes and hypertension.  She has A. fib on Eliquis as well as history of coronary artery disease status post bypass grafting in 2004.  She is a known left renal artery stenosis.  She also has moderate renal insufficiency.  She does complain of symptoms compatible with claudication although she has pain at rest as well.  She has exam notable for absent pedal pulses and venous stasis changes.  Dopplers performed 10/25/2017 revealed calcified vessels which are noncompressible ABIs in the 0.8 range.  There is no evidence of critical limb ischemia.   Current Meds  Medication Sig  . Calcium Carbonate-Vitamin D (CALTRATE 600+D PO) Take by mouth 2 (two) times daily.  . carvedilol (COREG) 12.5 MG tablet Take 1 tablet (12.5 mg total) by mouth 2 (two) times daily with a meal.  . diclofenac sodium (VOLTAREN) 1 % GEL Apply 4 g topically 4 (four) times daily. To affected joint.  Marland Kitchen ELIQUIS 5 MG TABS tablet TAKE 1 TABLET(5 MG) BY MOUTH TWICE DAILY  . escitalopram (LEXAPRO) 5 MG tablet TAKE 1 TABLET BY MOUTH DAILY  . FeFum-FePoly-FA-B Cmp-C-Biot (INTEGRA PLUS) CAPS TAKE 1 CAPSULE BY MOUTH DAILY  . Fluticasone-Salmeterol (ADVAIR DISKUS) 500-50 MCG/DOSE AEPB Inhale 1 puff into the lungs 2 (two) times daily.  Marland Kitchen gabapentin (NEURONTIN) 300 MG capsule TAKE 1 CAPSULE(300 MG) BY MOUTH THREE TIMES DAILY  . glucose blood (ONE TOUCH ULTRA TEST) test strip Check blood sugar up to 4 times daily. Dx DM ICD 10 E11.9 insulin dependent.  . Insulin Glargine (TOUJEO SOLOSTAR) 300 UNIT/ML SOPN Inject  10-15 Units into the skin daily.  . Insulin Pen Needle (BD PEN NEEDLE NANO U/F) 32G X 4 MM MISC Inject into skin once daily. Use with insulin pen. DX DM ICD-10 E11.22  . ipratropium-albuterol (DUONEB) 0.5-2.5 (3) MG/3ML SOLN Take 3 mLs by nebulization every 2 (two) hours as needed (wheeze, SOB, cough).  . IRON PO Take by mouth.  Marland Kitchen JANUVIA 50 MG tablet TAKE 1 TABLET BY MOUTH EVERY DAY  . levothyroxine (SYNTHROID, LEVOTHROID) 88 MCG tablet TAKE 1 TABLET BY MOUTH DAILY BEFORE BREAKFAST  . losartan (COZAAR) 50 MG tablet Take 1 tablet (50 mg total) by mouth daily.  . montelukast (SINGULAIR) 10 MG tablet TAKE 1 TABLET BY MOUTH AT BEDTIME  . Multiple Vitamin (MULTIVITAMIN) capsule Take 1 capsule by mouth.  Marland Kitchen omeprazole (PRILOSEC) 20 MG capsule TAKE 1 CAPSULE BY MOUTH TWICE DAILY     Allergies  Allergen Reactions  . Tetracycline Shortness Of Breath  . Tetracyclines & Related Anaphylaxis and Shortness Of Breath    dizziness  . Amlodipine Other (See Comments)    Edema   . Amoxicillin Other (See Comments)    Questionable rash   . Latex Dermatitis    Bandages, pulls skin  . Other Dermatitis    Bandaids    . Sulfamethoxazole-Trimethoprim Other (See Comments)    dizzy   . Sulfa Antibiotics Rash  . Tape Dermatitis and Rash    Bandaids    Social History   Socioeconomic History  . Marital status: Married  Spouse name: Not on file  . Number of children: 4  . Years of education: Not on file  . Highest education level: Not on file  Occupational History  . Not on file  Social Needs  . Financial resource strain: Not on file  . Food insecurity:    Worry: Not on file    Inability: Not on file  . Transportation needs:    Medical: Not on file    Non-medical: Not on file  Tobacco Use  . Smoking status: Never Smoker  . Smokeless tobacco: Never Used  Substance and Sexual Activity  . Alcohol use: No  . Drug use: No  . Sexual activity: Never  Lifestyle  . Physical activity:    Days  per week: Not on file    Minutes per session: Not on file  . Stress: Not on file  Relationships  . Social connections:    Talks on phone: Not on file    Gets together: Not on file    Attends religious service: Not on file    Active member of club or organization: Not on file    Attends meetings of clubs or organizations: Not on file    Relationship status: Not on file  . Intimate partner violence:    Fear of current or ex partner: Not on file    Emotionally abused: Not on file    Physically abused: Not on file    Forced sexual activity: Not on file  Other Topics Concern  . Not on file  Social History Narrative  . Not on file     Review of Systems: General: negative for chills, fever, night sweats or weight changes.  Cardiovascular: negative for chest pain, dyspnea on exertion, edema, orthopnea, palpitations, paroxysmal nocturnal dyspnea or shortness of breath Dermatological: negative for rash Respiratory: negative for cough or wheezing Urologic: negative for hematuria Abdominal: negative for nausea, vomiting, diarrhea, bright red blood per rectum, melena, or hematemesis Neurologic: negative for visual changes, syncope, or dizziness All other systems reviewed and are otherwise negative except as noted above.    Blood pressure (!) 184/85, pulse 77, height 5\' 7"  (1.702 m), weight 161 lb (73 kg).  General appearance: alert and no distress Neck: no adenopathy, no carotid bruit, no JVD, supple, symmetrical, trachea midline and thyroid not enlarged, symmetric, no tenderness/mass/nodules Lungs: clear to auscultation bilaterally Heart: regular rate and rhythm, S1, S2 normal, no murmur, click, rub or gallop Extremities: extremities normal, atraumatic, no cyanosis or edema Pulses: Absent pedal pulses Skin: Venous stasis changes bilaterally Neurologic: Alert and oriented X 3, normal strength and tone. Normal symmetric reflexes. Normal coordination and gait  EKG atrial fibrillation  with a ventricular response of 77, left axis deviation.  I personally reviewed this EKG.  ASSESSMENT AND PLAN:   Claudication in peripheral vascular disease ([place]) Ms. Windhorst was referred to me by [name] for evaluation of claudication.  She is 82 year old female history of hypertension and diabetes as well as ischemic heart disease.  She does complain of pain when she walks as well as at rest.  She had recent lower extremity segmental pressures in our office performed 18 that revealed ABIs and 8.8 range bilaterally although her vessels were calcified.  I cannot feel pedal pulses.  She does have serum creatinine in the mid 162 and apparently only has 1 functioning kidney.  She does not have 3 of critical limb ischemia.  I am going to get lower extremity until Doppler studies but at  this point I am not inclined to pursue an invasive evaluation.      Lorretta Harp MD FACP,FACC,FAHA, [facility] 03/17/2018 10:31 AM

## 2018-03-17 NOTE — Patient Instructions (Signed)
Medication Instructions: Your physician recommends that you continue on your current medications as directed. Please refer to the Current Medication list given to you today.   Testing/Procedures: Your physician has requested that you have a lower extremity arterial duplex. During this test, ultrasound is used to evaluate arterial blood flow in the legs. Allow one hour for this exam. There are no restrictions or special instructions.  Your physician has requested that you have an ankle brachial index (ABI). During this test an ultrasound and blood pressure cuff are used to evaluate the arteries that supply the arms and legs with blood. Allow thirty minutes for this exam. There are no restrictions or special instructions.  Follow-Up: Your physician recommends that you schedule a follow-up appointment as needed with Dr. Gwenlyn Found.

## 2018-03-17 NOTE — Assessment & Plan Note (Signed)
Briana Werner was referred to me by [name] for evaluation of claudication.  She is 82 year old female history of hypertension and diabetes as well as ischemic heart disease.  She does complain of pain when she walks as well as at rest.  She had recent lower extremity segmental pressures in our office performed 18 that revealed ABIs and 8.8 range bilaterally although her vessels were calcified.  I cannot feel pedal pulses.  She does have serum creatinine in the mid 162 and apparently only has 1 functioning kidney.  She does not have 3 of critical limb ischemia.  I am going to get lower extremity until Doppler studies but at this point I am not inclined to pursue an invasive evaluation.

## 2018-03-22 ENCOUNTER — Other Ambulatory Visit: Payer: Self-pay | Admitting: Osteopathic Medicine

## 2018-03-29 ENCOUNTER — Other Ambulatory Visit: Payer: Self-pay | Admitting: Osteopathic Medicine

## 2018-03-29 ENCOUNTER — Telehealth: Payer: Self-pay

## 2018-03-29 DIAGNOSIS — E1121 Type 2 diabetes mellitus with diabetic nephropathy: Secondary | ICD-10-CM

## 2018-03-29 DIAGNOSIS — E11649 Type 2 diabetes mellitus with hypoglycemia without coma: Secondary | ICD-10-CM

## 2018-03-29 DIAGNOSIS — Z794 Long term (current) use of insulin: Secondary | ICD-10-CM

## 2018-03-29 MED ORDER — INSULIN GLARGINE 300 UNIT/ML ~~LOC~~ SOPN: 10.0000 [IU] | PEN_INJECTOR | Freq: Every day | SUBCUTANEOUS | 5 refills | Status: DC

## 2018-03-29 NOTE — Telephone Encounter (Signed)
Toujeo rx was renewed electronically, however wrong SIG (inject 38u daily) noted on rx. Spoke to Financial planner at [facility], rx has been cancelled. Can you please re-send the rx with correct sig. Thanks.

## 2018-03-29 NOTE — Telephone Encounter (Signed)
Fixed, my last documentation notes that she was taking about 10 units daily.  She is due to follow-up next month, can address it in detail with her at that point.

## 2018-03-30 NOTE — Telephone Encounter (Signed)
Noted. Pharmacist was updated.

## 2018-04-03 ENCOUNTER — Other Ambulatory Visit: Payer: Self-pay | Admitting: Osteopathic Medicine

## 2018-04-03 ENCOUNTER — Encounter (HOSPITAL_COMMUNITY): Payer: Medicare Other

## 2018-04-03 ENCOUNTER — Ambulatory Visit (HOSPITAL_COMMUNITY)
Admission: RE | Admit: 2018-04-03 | Discharge: 2018-04-03 | Disposition: A | Discharge status: Home / Self Care | Payer: Medicare Other | Source: Ambulatory Visit | Attending: Cardiovascular Disease | Admitting: Cardiovascular Disease

## 2018-04-03 DIAGNOSIS — I739 Peripheral vascular disease, unspecified: Secondary | ICD-10-CM

## 2018-04-05 ENCOUNTER — Telehealth: Payer: Self-pay | Admitting: *Deleted

## 2018-04-05 NOTE — Telephone Encounter (Addendum)
Left message for pt to call    ----- Message from Lorretta Harp, MD sent at 04/05/2018  7:15 AM EDT ----- Return office visit with me to discuss results at next available

## 2018-04-05 NOTE — Telephone Encounter (Signed)
Spoke with pt, aware of results and follow up scheduled.

## 2018-04-10 ENCOUNTER — Telehealth: Payer: Self-pay

## 2018-04-10 NOTE — Telephone Encounter (Signed)
Pt called due to some continued SX she has been havng. Pt complains of a chronic sore throat which causes a lot of coughing and sometimes makes her feel choked and makes it hard to catch her breath.  Pt is using her inhalers and has been to see throat specialist. Pt does not report any new SX, just states the "same stuff" is still going on.   She has been scheduled for tomorrow to be checked out, since she is concerned that things are not improving

## 2018-04-11 ENCOUNTER — Encounter: Payer: Self-pay | Admitting: Osteopathic Medicine

## 2018-04-11 ENCOUNTER — Ambulatory Visit (INDEPENDENT_AMBULATORY_CARE_PROVIDER_SITE_OTHER): Payer: Medicare Other | Admitting: Osteopathic Medicine

## 2018-04-11 VITALS — BP 155/63 | HR 50 | Temp 97.6°F | Wt 158.0 lb

## 2018-04-11 DIAGNOSIS — R1310 Dysphagia, unspecified: Secondary | ICD-10-CM | POA: Diagnosis not present

## 2018-04-11 DIAGNOSIS — R0989 Other specified symptoms and signs involving the circulatory and respiratory systems: Secondary | ICD-10-CM

## 2018-04-11 DIAGNOSIS — F458 Other somatoform disorders: Secondary | ICD-10-CM

## 2018-04-11 DIAGNOSIS — I701 Atherosclerosis of renal artery: Secondary | ICD-10-CM | POA: Diagnosis not present

## 2018-04-11 DIAGNOSIS — R198 Other specified symptoms and signs involving the digestive system and abdomen: Secondary | ICD-10-CM

## 2018-04-11 MED ORDER — CETIRIZINE HCL 10 MG PO TABS: 10.0000 mg | ORAL_TABLET | Freq: Every day | ORAL | 11 refills | Status: DC

## 2018-04-11 NOTE — Patient Instructions (Addendum)
Will send referral to different ENT office for second opinion. You should get a call to set something up, let us know if you don't hear anything!   Will send some medicine which may help chronic cough: allergy medicine. Continue your inhaler and your omeprazole.

## 2018-04-11 NOTE — Progress Notes (Signed)
HPI: Briana Werner is a 82 y.o. female who  has a past medical history of Asthma, Atrial flutter ([place]), CAD (coronary artery disease), Cerebrovascular disease, Chronic renal insufficiency (02/26/2014), COPD (chronic obstructive pulmonary disease) ([place]) (02/26/2014), Hypothyroid (02/26/2014), Presence of stent in coronary artery in patient with coronary artery disease (02/26/2014), Renal artery stenosis ([place]), and Type 2 diabetes mellitus ([place]) (02/26/2014).  she [facility] to [facility], 04/11/18,  for chief complaint of:  Sore throat  Months of sore throat/globus sensation ongoing.  Associated with dry nagging cough.  She reports previous evaluation by ENT, as well as swallow study but never followed up after that.  Able to swallow quids and typically does fine with solids, but she certainly gets very nervous when she feels that there is a choking sensation "out of nowhere" with swallowing     Past medical history, surgical history, and family history reviewed.  Current medication list and allergy/intolerance information reviewed.   (See remainder of HPI, ROS, Phys Exam below)    ASSESSMENT/PLAN:   Dysphagia, unspecified type - Plan: Ambulatory referral to ENT  Globus sensation - Plan: Ambulatory referral to ENT   She is already on PPI, GERD may be contributor to chronic cough/globus sensation.  Discussed adding antihistamine and consistent use of Flonase nasal spray for possible postnasal drip contributing to chronic cough.  Patient does not recall having any kind of laryngoscopy procedure, will refer back to ENT for consideration of this given her globus sensation and swallowing difficulties.  Could also certainly consider GI referral.    Meds ordered this encounter  Medications  . cetirizine (ZYRTEC) 10 MG tablet    Sig: Take 1 tablet (10 mg total) by mouth at bedtime.    Dispense:  30 tablet    Refill:  11    Patient Instructions  Will send  referral to different ENT office for second opinion. You should get a call to set something up, let us know if you don't hear anything!   Will send some medicine which may help chronic cough: allergy medicine. Continue your inhaler and your omeprazole.    Follow-up plan: Return for recheck depending on ENT findings and recommendations .     ############################################ ############################################ ############################################ ############################################    Outpatient Encounter Medications as of 04/11/2018  Medication Sig  . Calcium Carbonate-Vitamin D (CALTRATE 600+D PO) Take by mouth 2 (two) times daily.  . carvedilol (COREG) 12.5 MG tablet Take 1 tablet (12.5 mg total) by mouth 2 (two) times daily with a meal.  . diclofenac sodium (VOLTAREN) 1 % GEL Apply 4 g topically 4 (four) times daily. To affected joint.  Marland Kitchen ELIQUIS 5 MG TABS tablet TAKE 1 TABLET(5 MG) BY MOUTH TWICE DAILY  . escitalopram (LEXAPRO) 5 MG tablet TAKE 1 TABLET BY MOUTH DAILY  . FeFum-FePoly-FA-B Cmp-C-Biot (INTEGRA PLUS) CAPS TAKE 1 CAPSULE BY MOUTH DAILY  . Fluticasone-Salmeterol (ADVAIR DISKUS) 500-50 MCG/DOSE AEPB Inhale 1 puff into the lungs 2 (two) times daily.  Marland Kitchen gabapentin (NEURONTIN) 300 MG capsule TAKE 1 CAPSULE(300 MG) BY MOUTH THREE TIMES DAILY  . glucose blood (ONE TOUCH ULTRA TEST) test strip Check blood sugar up to 4 times daily. Dx DM ICD 10 E11.9 insulin dependent.  . Insulin Glargine (TOUJEO SOLOSTAR) 300 UNIT/ML SOPN Inject 10 Units into the skin daily.  . Insulin Pen Needle (BD PEN NEEDLE NANO U/F) 32G X 4 MM MISC Inject into skin once daily. Use with insulin pen. DX DM ICD-10 E11.22  .  ipratropium-albuterol (DUONEB) 0.5-2.5 (3) MG/3ML SOLN Take 3 mLs by nebulization every 2 (two) hours as needed (wheeze, SOB, cough).  . IRON PO Take by mouth.  Marland Kitchen JANUVIA 50 MG tablet TAKE 1 TABLET BY MOUTH EVERY DAY  . levothyroxine (SYNTHROID,  LEVOTHROID) 88 MCG tablet TAKE 1 TABLET BY MOUTH DAILY BEFORE BREAKFAST  . losartan (COZAAR) 50 MG tablet Take 1 tablet (50 mg total) by mouth daily.  . montelukast (SINGULAIR) 10 MG tablet TAKE 1 TABLET BY MOUTH AT BEDTIME  . montelukast (SINGULAIR) 10 MG tablet TAKE 1 TABLET BY MOUTH AT BEDTIME  . Multiple Vitamin (MULTIVITAMIN) capsule Take 1 capsule by mouth.  Marland Kitchen omeprazole (PRILOSEC) 20 MG capsule TAKE 1 CAPSULE BY MOUTH TWICE DAILY   No facility-administered encounter medications on file as of 04/11/2018.    Allergies  Allergen Reactions  . Tetracycline Shortness Of Breath  . Tetracyclines & Related Anaphylaxis and Shortness Of Breath    dizziness  . Amlodipine Other (See Comments)    Edema   . Amoxicillin Other (See Comments)    Questionable rash   . Latex Dermatitis    Bandages, pulls skin  . Other Dermatitis    Bandaids    . Sulfamethoxazole-Trimethoprim Other (See Comments)    dizzy   . Sulfa Antibiotics Rash  . Tape Dermatitis and Rash    Bandaids      Review of Systems:  Constitutional: No recent illness  HEENT: No  headache, no vision change  Cardiac: No  chest pain, No  pressure, No palpitations  Respiratory:  No  shortness of breath. No  Cough  Gastrointestinal: No  abdominal pain,    Exam:  BP (!) 161/68 (BP Location: Left Arm, Patient Position: Sitting, Cuff Size: Normal)   Pulse 79   Temp 97.6 F (36.4 C) (Oral)   Wt 158 lb (71.7 kg)   LMP  (LMP Unknown)   BMI 24.75 kg/m   Constitutional: VS see above. General Appearance: alert, well-developed, well-nourished, NAD  Eyes: Normal lids and conjunctive, non-icteric sclera  Ears, Nose, Mouth, Throat: MMM, Normal external inspection ears/nares/mouth/lips/gums.  Normal tonsils and pharynx.  Neck: No masses, trachea midline.  No lymphadenopathy or thyromegaly.  Respiratory: Normal respiratory effort. no wheeze, no rhonchi, no rales  Musculoskeletal: Gait normal. Symmetric and independent  movement of all extremities  Neurological: Normal balance/coordination. No tremor.  Skin: warm, dry, intact.   Psychiatric: Normal judgment/insight. Normal mood and affect. Oriented x3.   Visit summary with medication list and pertinent instructions was printed for patient to review, advised to alert [place] if any changes needed. All questions at time of visit were answered - patient instructed to contact office with any additional concerns. ER/RTC precautions were reviewed with the patient and understanding verbalized.   Follow-up plan: Return for recheck depending on ENT findings and recommendations .  Note: Total time spent 25 minutes, greater than 50% of the visit was spent face-to-face counseling and coordinating care for the following: The primary encounter diagnosis was Dysphagia, unspecified type. A diagnosis of Globus sensation was also pertinent to this visit.Marland Kitchen  Please note: voice recognition software was used to produce this document, and typos may escape review. Please contact Dr. Sheppard Coil for any needed clarifications.

## 2018-04-13 ENCOUNTER — Encounter: Payer: Self-pay | Admitting: Osteopathic Medicine

## 2018-04-13 DIAGNOSIS — R0989 Other specified symptoms and signs involving the circulatory and respiratory systems: Secondary | ICD-10-CM | POA: Insufficient documentation

## 2018-04-13 DIAGNOSIS — R198 Other specified symptoms and signs involving the digestive system and abdomen: Secondary | ICD-10-CM | POA: Insufficient documentation

## 2018-04-28 DIAGNOSIS — J370 Chronic laryngitis: Secondary | ICD-10-CM | POA: Diagnosis not present

## 2018-05-01 ENCOUNTER — Other Ambulatory Visit: Payer: Self-pay | Admitting: Osteopathic Medicine

## 2018-05-02 ENCOUNTER — Ambulatory Visit: Payer: Medicare Other | Admitting: Osteopathic Medicine

## 2018-05-05 ENCOUNTER — Ambulatory Visit (INDEPENDENT_AMBULATORY_CARE_PROVIDER_SITE_OTHER): Payer: Medicare Other | Admitting: Osteopathic Medicine

## 2018-05-05 ENCOUNTER — Encounter: Payer: Self-pay | Admitting: Osteopathic Medicine

## 2018-05-05 VITALS — BP 145/75 | HR 77 | Temp 97.6°F | Wt 155.1 lb

## 2018-05-05 DIAGNOSIS — M79604 Pain in right leg: Secondary | ICD-10-CM

## 2018-05-05 DIAGNOSIS — Z794 Long term (current) use of insulin: Secondary | ICD-10-CM

## 2018-05-05 DIAGNOSIS — F458 Other somatoform disorders: Secondary | ICD-10-CM | POA: Diagnosis not present

## 2018-05-05 DIAGNOSIS — E1169 Type 2 diabetes mellitus with other specified complication: Secondary | ICD-10-CM | POA: Diagnosis not present

## 2018-05-05 DIAGNOSIS — I701 Atherosclerosis of renal artery: Secondary | ICD-10-CM

## 2018-05-05 DIAGNOSIS — M79605 Pain in left leg: Secondary | ICD-10-CM | POA: Diagnosis not present

## 2018-05-05 DIAGNOSIS — R198 Other specified symptoms and signs involving the digestive system and abdomen: Secondary | ICD-10-CM

## 2018-05-05 DIAGNOSIS — R0989 Other specified symptoms and signs involving the circulatory and respiratory systems: Secondary | ICD-10-CM

## 2018-05-05 LAB — POCT GLYCOSYLATED HEMOGLOBIN (HGB A1C): Hemoglobin A1C: 7.2 % — AB (ref 4.0–5.6)

## 2018-05-05 MED ORDER — CETIRIZINE HCL 10 MG PO TABS: 10.0000 mg | ORAL_TABLET | Freq: Every day | ORAL | 3 refills | Status: AC

## 2018-05-05 NOTE — Progress Notes (Signed)
HPI: Briana Werner is a 82 y.o. female who  has a past medical history of Asthma, Atrial flutter ([place]), CAD (coronary artery disease), Cerebrovascular disease, Chronic renal insufficiency (02/26/2014), COPD (chronic obstructive pulmonary disease) ([place]) (02/26/2014), Hypothyroid (02/26/2014), Presence of stent in coronary artery in patient with coronary artery disease (02/26/2014), Renal artery stenosis ([place]), and Type 2 diabetes mellitus ([place]) (02/26/2014).  she presents to [facility] today, 05/05/18,  for chief complaint of:  DM2 follow-up  DM2: doing well on current medications, no complaints  Globus sensation: following with ENT, no concerns at this time, no new choking sensations or concern for aspiration  Leg pain: reports "clots" (I think she means atherosclerotic PAD, no anticoagulation was Rx), she's staying active and following with vascular       Past medical history, surgical history, and family history reviewed.  Current medication list and allergy/intolerance information reviewed.   (See remainder of HPI, ROS, Phys Exam below)  No results found.  Results for orders placed or performed in visit on 05/05/18 (from the past 72 hour(s))  POCT HgB A1C     Status: Abnormal   Collection Time: 05/05/18 10:19 AM  Result Value Ref Range   Hemoglobin A1C 7.2 (A) 4.0 - 5.6 %   HbA1c POC (<> result, manual entry)     HbA1c, POC (prediabetic range)     HbA1c, POC (controlled diabetic range)       ASSESSMENT/PLAN: Chronic conditions stable. A1C ok for age and comorbids.   Type 2 diabetes mellitus with other specified complication, with long-term current use of insulin ([place]) - Plan: POCT HgB A1C  Globus sensation  Leg pain, bilateral   Follow-up plan: Return in about 3 months (around 08/05/2018) for recheck diabetes - see me sooner if needed.                  ############################################ ############################################ ############################################ ############################################    Outpatient Encounter Medications as of 05/05/2018  Medication Sig  . Calcium Carbonate-Vitamin D (CALTRATE 600+D PO) Take by mouth 2 (two) times daily.  . carvedilol (COREG) 12.5 MG tablet Take 1 tablet (12.5 mg total) by mouth 2 (two) times daily with a meal.  . cetirizine (ZYRTEC) 10 MG tablet Take 1 tablet (10 mg total) by mouth at bedtime.  . diclofenac sodium (VOLTAREN) 1 % GEL Apply 4 g topically 4 (four) times daily. To affected joint.  Marland Kitchen ELIQUIS 5 MG TABS tablet TAKE 1 TABLET(5 MG) BY MOUTH TWICE DAILY  . escitalopram (LEXAPRO) 5 MG tablet TAKE 1 TABLET BY MOUTH DAILY  . FeFum-FePoly-FA-B Cmp-C-Biot (INTEGRA PLUS) CAPS TAKE 1 CAPSULE BY MOUTH DAILY  . Fluticasone-Salmeterol (ADVAIR DISKUS) 500-50 MCG/DOSE AEPB Inhale 1 puff into the lungs 2 (two) times daily.  Marland Kitchen gabapentin (NEURONTIN) 300 MG capsule TAKE 1 CAPSULE(300 MG) BY MOUTH THREE TIMES DAILY  . glucose blood (ONE TOUCH ULTRA TEST) test strip Check blood sugar up to 4 times daily. Dx DM ICD 10 E11.9 insulin dependent.  . Insulin Glargine (TOUJEO SOLOSTAR) 300 UNIT/ML SOPN Inject 10 Units into the skin daily.  . Insulin Pen Needle (BD PEN NEEDLE NANO U/F) 32G X 4 MM MISC Inject into skin once daily. Use with insulin pen. DX DM ICD-10 E11.22  . ipratropium-albuterol (DUONEB) 0.5-2.5 (3) MG/3ML SOLN Take 3 mLs by nebulization every 2 (two) hours as needed (wheeze, SOB, cough).  . IRON PO Take by mouth.  Marland Kitchen JANUVIA 50 MG tablet TAKE 1 TABLET BY MOUTH EVERY DAY  .  levothyroxine (SYNTHROID, LEVOTHROID) 88 MCG tablet TAKE 1 TABLET BY MOUTH DAILY BEFORE BREAKFAST  . losartan (COZAAR) 50 MG tablet Take 1 tablet (50 mg total) by mouth daily.  . montelukast (SINGULAIR) 10 MG tablet TAKE 1 TABLET BY MOUTH AT BEDTIME  .  montelukast (SINGULAIR) 10 MG tablet TAKE 1 TABLET BY MOUTH AT BEDTIME  . Multiple Vitamin (MULTIVITAMIN) capsule Take 1 capsule by mouth.  Marland Kitchen omeprazole (PRILOSEC) 20 MG capsule TAKE 1 CAPSULE BY MOUTH TWICE DAILY   No facility-administered encounter medications on file as of 05/05/2018.    Allergies  Allergen Reactions  . Tetracycline Shortness Of Breath  . Tetracyclines & Related Anaphylaxis and Shortness Of Breath    dizziness  . Amlodipine Other (See Comments)    Edema   . Amoxicillin Other (See Comments)    Questionable rash   . Latex Dermatitis    Bandages, pulls skin  . Other Dermatitis    Bandaids    . Sulfamethoxazole-Trimethoprim Other (See Comments)    dizzy   . Sulfa Antibiotics Rash  . Tape Dermatitis and Rash    Bandaids      Review of Systems:  Constitutional: No recent illness, no fever/chills   HEENT: No  headache  Cardiac: No  chest pain, No  pressure, No palpitations  Respiratory:  No  shortness of breath. No  Cough  Gastrointestinal: No  abdominal pain  Musculoskeletal: No new myalgia/arthralgia  Skin: No  Rash   Exam:  BP (!) 145/75   Pulse 77   Temp 97.6 F (36.4 C) (Oral)   Wt 155 lb 1.6 oz (70.4 kg)   LMP  (LMP Unknown)   BMI 24.29 kg/m  Second 152/67  Constitutional: VS see above. General Appearance: alert, well-developed, well-nourished, NAD  Neck: No masses, trachea midline.   Respiratory: Normal respiratory effort. no wheeze, no rhonchi, no rales  Cardiovascular: S1/S2 normal, no rub/gallop auscultated. RRR.   Musculoskeletal: Gait normal.   Neurological: Normal balance/coordination. No tremor.  Skin: warm, dry, intact.   Psychiatric: Normal judgment/insight. Normal mood and affect. Oriented x3.   Visit summary with medication list and pertinent instructions was printed for patient to review, advised to alert [place] if any changes needed. All questions at time of visit were answered - patient instructed to contact office  with any additional concerns. ER/RTC precautions were reviewed with the patient and understanding verbalized.   Follow-up plan: Return in about 3 months (around 08/05/2018) for recheck diabetes - see me sooner if needed.  Note: Total time spent 15 minutes, greater than 50% of the visit was spent face-to-face counseling and coordinating care for the following: The primary encounter diagnosis was Type 2 diabetes mellitus with other specified complication, with long-term current use of insulin ([place]). Diagnoses of Globus sensation and Leg pain, bilateral were also pertinent to this visit.Marland Kitchen  Please note: voice recognition software was used to produce this document, and typos may escape review. Please contact Dr. Sheppard Coil for any needed clarifications.

## 2018-05-12 ENCOUNTER — Other Ambulatory Visit: Payer: Self-pay | Admitting: Osteopathic Medicine

## 2018-05-12 ENCOUNTER — Encounter: Payer: Self-pay | Admitting: Cardiovascular Disease

## 2018-05-12 ENCOUNTER — Ambulatory Visit (INDEPENDENT_AMBULATORY_CARE_PROVIDER_SITE_OTHER): Payer: Medicare Other | Admitting: Cardiovascular Disease

## 2018-05-12 VITALS — BP 159/78 | HR 112 | Ht 67.0 in | Wt 155.0 lb

## 2018-05-12 DIAGNOSIS — I701 Atherosclerosis of renal artery: Secondary | ICD-10-CM | POA: Diagnosis not present

## 2018-05-12 DIAGNOSIS — I10 Essential (primary) hypertension: Secondary | ICD-10-CM | POA: Diagnosis not present

## 2018-05-12 DIAGNOSIS — I739 Peripheral vascular disease, unspecified: Secondary | ICD-10-CM | POA: Diagnosis not present

## 2018-05-12 NOTE — Patient Instructions (Signed)
Your physician recommends that you schedule a follow-up appointment in: AS NEEDED  

## 2018-05-12 NOTE — Progress Notes (Signed)
Ms. Willette returns today for follow-up of her Doppler studies.  She was referred to me by [name] for claudication.  She does not have any open wounds or critical limb ischemia.  She does have one working kidney with a creatinine of 1.6.  She is also diabetic.  Her Dopplers revealed no significant blockages in her right leg other than a occluded posterior tibial artery.  Her left popliteal potentially occluded.  Her symptoms do not really sound vascular.  At this point, I did not feel that angiography and endovascular therapy would be appropriate and would potentially result in acute renal failure.  I will see her back as needed.  Lorretta Harp, M.D., [place], [facility], [place] [address] [place], [place]  [address]  [phone] 05/12/2018 2:52 PM

## 2018-05-12 NOTE — Assessment & Plan Note (Signed)
Briana Werner returns today for follow-up of her Doppler studies.  She was referred to me by [name] for claudication.  She does not have any open wounds or critical limb ischemia.  She does have one working kidney with a creatinine of 1.6.  She is also diabetic.  Her Dopplers revealed no significant blockages in her right leg other than a occluded posterior tibial artery.  Her left popliteal potentially occluded.  Her symptoms do not really sound vascular.  At this point, I did not feel that angiography and endovascular therapy would be appropriate and would potentially result in acute renal failure.  I will see her back as needed.

## 2018-05-19 ENCOUNTER — Other Ambulatory Visit: Payer: Self-pay | Admitting: Osteopathic Medicine

## 2018-06-05 ENCOUNTER — Other Ambulatory Visit: Payer: Self-pay | Admitting: Osteopathic Medicine

## 2018-06-08 ENCOUNTER — Encounter: Payer: Self-pay | Admitting: Physician Assistant

## 2018-06-08 ENCOUNTER — Ambulatory Visit (INDEPENDENT_AMBULATORY_CARE_PROVIDER_SITE_OTHER): Payer: Medicare Other | Admitting: Physician Assistant

## 2018-06-08 DIAGNOSIS — Z23 Encounter for immunization: Secondary | ICD-10-CM

## 2018-06-08 NOTE — Progress Notes (Signed)
..  Diagnoses and all orders for this visit:  Encounter for immunization -     Flu vaccine HIGH DOSE PF   No concerns or compliants.

## 2018-06-14 ENCOUNTER — Telehealth: Payer: Self-pay | Admitting: *Deleted

## 2018-06-14 NOTE — Telephone Encounter (Signed)
   [facility] Pre-operative Risk Assessment    Request for surgical clearance:  1. What type of surgery is being performed? SURGICAL REMOVAL OF TOOTH #29  2. When is this surgery scheduled? TBD  3. What type of clearance is required (medical clearance vs. Pharmacy clearance to hold med vs. Both)? BOTH  4. Are there any medications that need to be held prior to surgery and how long? ELIQUIS  5. Practice name and name of physician performing surgery? [facility] Helene Kel[facility]  6. What is your office phone number [phone]   7.   What is your office fax number [phone]  8.   Anesthesia type (None, local, MAC, general) ? LOCAL WITH EPINEPHRINE   Devra Dopp 06/14/2018, 3:30 PM  _________________________________________________________________   (provider comments below)

## 2018-06-16 NOTE — Telephone Encounter (Signed)
Recommend continuing Eliquis for single dental extraction. No indication for pre op antibiotics.

## 2018-06-16 NOTE — Telephone Encounter (Signed)
Left VM, awaiting call back. Will also need pharmacy clearance for Eliquis. Will go ahead and forward this to pharmacy.

## 2018-06-19 NOTE — Telephone Encounter (Signed)
Left message for pt to call back  °

## 2018-06-27 NOTE — Telephone Encounter (Signed)
   Primary Cardiologist: Kirk Ruths, MD  Chart reviewed as part of pre-operative protocol coverage. Simple dental extractions are considered low risk procedures per guidelines and generally do not require any specific cardiac clearance.   It is also generally accepted that for simple extractions and dental cleanings, there is no need to interrupt blood thinner therapy.   Antibiotic prophylaxis is not required for the patient.  Please call with questions.  Call back staff: Note sent to requesting dentist.  Please make sure it was received.   Note will be removed from preop.  Richardson Dopp, PA-C 06/27/2018, 3:07 PM

## 2018-07-02 ENCOUNTER — Other Ambulatory Visit: Payer: Self-pay | Admitting: Osteopathic Medicine

## 2018-07-04 ENCOUNTER — Other Ambulatory Visit: Payer: Self-pay | Admitting: Osteopathic Medicine

## 2018-07-04 NOTE — Telephone Encounter (Signed)
   Primary Cardiologist: Kirk Ruths, MD  Chart reviewed as part of pre-operative protocol coverage. Will forward to callback staff to handle as this does not need any further provider input. May need to get clearance from patient to release information to their office.   Charlie Pitter, PA-C 07/04/2018, 2:33 PM

## 2018-07-04 NOTE — Telephone Encounter (Signed)
Spoke with Torey from [facility] re: her request for pt's diagnoses and information. Julio Sicks has been advised that she would need to send over a medical release of information over in order in to get the pt's health information. She verbalized understanding.

## 2018-07-04 NOTE — Telephone Encounter (Signed)
° ° °  Dental office has additional questions Briana Werner from dental office calling, wants to know cardiac diagnosis, past surgical procedures and confirm clearance for surgical extraction. Requesting call  [phone]

## 2018-07-05 NOTE — Progress Notes (Signed)
HPI: FU coronary disease, cerebrovascular disease and renal artery stenosis. Previously resided in [place]. Patient underwent coronary artery bypassing graft in 2004. Records unavailable. She has had stents placed since then but none since 2006.  Cardiac catheterization at [facility] in 2015 showed an occluded LAD and RCA.  LIMA to the LAD was patent as well as the saphenous vein graft to the right coronary artery. Carotid Dopplers March 2016 at [facility] showed no stenosis. Nuclear study April 2016 was normal.  Echocardiogram at [facility] in March 2019 showed normal LV function, mild aortic insufficiency, moderate mitral regurgitation, mild tricuspid regurgitation and moderate biatrial enlargement.  Severely elevated pulmonary pressure. Renal Dopplers July 2019 showed 1 to 59% right stenosis and greater than 60% left stenosis.  Lower extremity ABIs with Doppler July 2019 showed 50 to 74% right and left femoral artery stenosis.  There was total occlusion of the posterior tibial artery on the right and total occlusion of the anterior tibial artery on the left.  Seen by Dr. Gwenlyn Found and medical therapy recommended.  Since last seen,she denies dyspnea, chest pain, palpitations or syncope.  No bleeding.  Current Outpatient Medications  Medication Sig Dispense Refill  . ADVAIR DISKUS 500-50 MCG/DOSE AEPB INHALE 1 PUFF INTO THE LUNGS TWICE DAILY 60 each 2  . Calcium Carbonate-Vitamin D (CALTRATE 600+D PO) Take by mouth 2 (two) times daily.    . carvedilol (COREG) 12.5 MG tablet Take 1 tablet (12.5 mg total) by mouth 2 (two) times daily with a meal.    . cetirizine (ZYRTEC) 10 MG tablet Take 1 tablet (10 mg total) by mouth at bedtime. 90 tablet 3  . diclofenac sodium (VOLTAREN) 1 % GEL Apply 4 g topically 4 (four) times daily. To affected joint. 100 g 11  . ELIQUIS 5 MG TABS tablet TAKE 1 TABLET(5 MG) BY MOUTH TWICE DAILY 180 tablet 0  . escitalopram (LEXAPRO) 5 MG tablet Take 1 tablet (5 mg total) by mouth  daily. Pt must keep appt on 08/04/18 for refills. 30 tablet 0  . FeFum-FePoly-FA-B Cmp-C-Biot (INTEGRA PLUS) CAPS TAKE 1 CAPSULE BY MOUTH DAILY 90 capsule 0  . gabapentin (NEURONTIN) 300 MG capsule TAKE 1 CAPSULE(300 MG) BY MOUTH THREE TIMES DAILY 90 capsule 2  . glucose blood (ONE TOUCH ULTRA TEST) test strip Check blood sugar up to 4 times daily. Dx DM ICD 10 E11.9 insulin dependent. 200 each prn  . Insulin Glargine (TOUJEO SOLOSTAR) 300 UNIT/ML SOPN Inject 10 Units into the skin daily. 15 mL 5  . Insulin Pen Needle (BD PEN NEEDLE NANO U/F) 32G X 4 MM MISC Inject into skin once daily. Use with insulin pen. DX DM ICD-10 E11.22 100 each prn  . ipratropium-albuterol (DUONEB) 0.5-2.5 (3) MG/3ML SOLN Take 3 mLs by nebulization every 2 (two) hours as needed (wheeze, SOB, cough). 60 mL 1  . IRON PO Take by mouth.    Marland Kitchen JANUVIA 50 MG tablet TAKE 1 TABLET BY MOUTH EVERY DAY 90 tablet 0  . levothyroxine (SYNTHROID, LEVOTHROID) 88 MCG tablet TAKE 1 TABLET BY MOUTH DAILY BEFORE BREAKFAST 90 tablet 1  . montelukast (SINGULAIR) 10 MG tablet TAKE 1 TABLET BY MOUTH AT BEDTIME 90 tablet 0  . Multiple Vitamin (MULTIVITAMIN) capsule Take 1 capsule by mouth.    Marland Kitchen omeprazole (PRILOSEC) 20 MG capsule TAKE 1 CAPSULE BY MOUTH TWICE DAILY 60 capsule 0  . losartan (COZAAR) 50 MG tablet Take 1 tablet (50 mg total) by mouth daily. 90 tablet 3  No current facility-administered medications for this visit.      Past Medical History:  Diagnosis Date  . Asthma   . Atrial flutter ([place])   . CAD (coronary artery disease)   . Cerebrovascular disease   . Chronic renal insufficiency 02/26/2014  . COPD (chronic obstructive pulmonary disease) ([place]) 02/26/2014  . Hypothyroid 02/26/2014  . Presence of stent in coronary artery in patient with coronary artery disease 02/26/2014  . Renal artery stenosis ([place])   . Type 2 diabetes mellitus ([place]) 02/26/2014    Past Surgical History:  Procedure Laterality Date  . ABDOMINAL  HYSTERECTOMY    . APPENDECTOMY    . BACK SURGERY    . CHOLECYSTECTOMY    . CORONARY ARTERY BYPASS GRAFT     2004, [place]  . Hip replacement    . KNEE SURGERY Bilateral   . NASAL SINUS SURGERY    . SHOULDER SURGERY    . TONSILLECTOMY      Social History   Socioeconomic History  . Marital status: Married    Spouse name: Not on file  . Number of children: 4  . Years of education: Not on file  . Highest education level: Not on file  Occupational History  . Not on file  Social Needs  . Financial resource strain: Not on file  . Food insecurity:    Worry: Not on file    Inability: Not on file  . Transportation needs:    Medical: Not on file    Non-medical: Not on file  Tobacco Use  . Smoking status: Never Smoker  . Smokeless tobacco: Never Used  Substance and Sexual Activity  . Alcohol use: No  . Drug use: No  . Sexual activity: Never  Lifestyle  . Physical activity:    Days per week: Not on file    Minutes per session: Not on file  . Stress: Not on file  Relationships  . Social connections:    Talks on phone: Not on file    Gets together: Not on file    Attends religious service: Not on file    Active member of club or organization: Not on file    Attends meetings of clubs or organizations: Not on file    Relationship status: Not on file  . Intimate partner violence:    Fear of current or ex partner: Not on file    Emotionally abused: Not on file    Physically abused: Not on file    Forced sexual activity: Not on file  Other Topics Concern  . Not on file  Social History Narrative  . Not on file    Family History  Problem Relation Age of Onset  . CAD Father   . Heart attack Father     ROS: Bilateral lower extremity pain but no fevers or chills, productive cough, hemoptysis, dysphasia, odynophagia, melena, hematochezia, dysuria, hematuria, rash, seizure activity, orthopnea, PND, pedal edema. Remaining systems are negative.  Physical  Exam: Well-developed well-nourished in no acute distress.  Skin is warm and dry.  HEENT is normal.  Neck is supple.  Chest is clear to auscultation with normal expansion.  Cardiovascular exam is regular rate and rhythm.  Abdominal exam nontender or distended. No masses palpated. Extremities show no edema. neuro grossly intact  A/P  1 coronary artery disease-patient denies chest pain.  Continue medical therapy.  She is not on aspirin given need for apixaban.  Continue statin.  2 peripheral vascular disease-medical therapy per Dr. Alvester Chou.  3 history of atrial fibrillation-plan to continue beta-blocker for rate control.  Recent creatinine 1.62.  Patient will turn 82 in 3 days.  Change apixaban to 2.5 mg twice daily.  Check hemoglobin and renal function.  4 hyperlipidemia-continue statin.  5 mitral regurgitation-plan follow-up echocardiogram March 2020..  6 chronic stage III kidney disease  7 hypertension-blood pressure is elevated; increase Cozaar to 100 mg daily.  Check potassium and renal function in 1 week.  8 renal artery stenosis-repeat renal Dopplers July 2020.  Kirk Ruths, MD

## 2018-07-12 ENCOUNTER — Ambulatory Visit (INDEPENDENT_AMBULATORY_CARE_PROVIDER_SITE_OTHER): Payer: Medicare Other | Admitting: Cardiology

## 2018-07-12 ENCOUNTER — Encounter: Payer: Self-pay | Admitting: Cardiology

## 2018-07-12 VITALS — BP 182/65 | HR 67 | Ht 67.0 in | Wt 154.8 lb

## 2018-07-12 DIAGNOSIS — I2510 Atherosclerotic heart disease of native coronary artery without angina pectoris: Secondary | ICD-10-CM

## 2018-07-12 DIAGNOSIS — E7800 Pure hypercholesterolemia, unspecified: Secondary | ICD-10-CM

## 2018-07-12 DIAGNOSIS — I10 Essential (primary) hypertension: Secondary | ICD-10-CM | POA: Diagnosis not present

## 2018-07-12 DIAGNOSIS — I701 Atherosclerosis of renal artery: Secondary | ICD-10-CM

## 2018-07-12 DIAGNOSIS — I4891 Unspecified atrial fibrillation: Secondary | ICD-10-CM

## 2018-07-12 MED ORDER — APIXABAN 2.5 MG PO TABS
2.5000 mg | ORAL_TABLET | Freq: Two times a day (BID) | ORAL | 3 refills | Status: AC
Start: 2018-07-12 — End: ?

## 2018-07-12 MED ORDER — LOSARTAN POTASSIUM 100 MG PO TABS: 100.0000 mg | ORAL_TABLET | Freq: Every day | ORAL | 3 refills | Status: AC

## 2018-07-12 NOTE — Patient Instructions (Addendum)
Medication Instructions:  Your physician has recommended you make the following change in your medication:  1) INCREASE Cozaar to 100mg  daily. An Rx has been sent to your pharmacy 2) DECREASE Eliquis to 2.5mg  twice daily. An Rx has been sent to your pharmacy  If you need a refill on your cardiac medications before your next appointment, please call your pharmacy.   Lab work: Your physician recommends that you return for lab work in: 1 week (Bmet, Cbc)  If you have labs (blood work) drawn today and your tests are completely normal, you will receive your results only by: Marland Kitchen MyChart Message (if you have MyChart) OR . A paper copy in the mail If you have any lab test that is abnormal or we need to change your treatment, we will call you to review the results.  Testing/Procedures: None ordered  Follow-Up: At [facility], you and your health needs are our priority.  As part of our continuing mission to provide you with exceptional heart care, we have created designated Provider Care Teams.  These Care Teams include your primary Cardiologist (physician) and Advanced Practice Providers (APPs -  Physician Assistants and Nurse Practitioners) who all work together to provide you with the care you need, when you need it.  Your physician recommends that you schedule a follow-up appointment in: 6 months  Dr.Crenshaw   Any Other Special Instructions Will Be Listed Below (If Applicable).

## 2018-07-13 DIAGNOSIS — I4891 Unspecified atrial fibrillation: Secondary | ICD-10-CM | POA: Diagnosis not present

## 2018-07-13 DIAGNOSIS — I10 Essential (primary) hypertension: Secondary | ICD-10-CM | POA: Diagnosis not present

## 2018-07-13 LAB — CBC WITH DIFFERENTIAL/PLATELET
BASOS ABS: 70 {cells}/uL (ref 0–200)
Basophils Relative: 0.7 %
Eosinophils Absolute: 2360 cells/uL — ABNORMAL HIGH (ref 15–500)
Eosinophils Relative: 23.6 %
HEMATOCRIT: 36.2 % (ref 35.0–45.0)
HEMOGLOBIN: 12.2 g/dL (ref 11.7–15.5)
LYMPHS ABS: 2620 {cells}/uL (ref 850–3900)
MCH: 29.5 pg (ref 27.0–33.0)
MCHC: 33.7 g/dL (ref 32.0–36.0)
MCV: 87.4 fL (ref 80.0–100.0)
MPV: 10.6 fL (ref 7.5–12.5)
Monocytes Relative: 7.3 %
NEUTROS ABS: 4220 {cells}/uL (ref 1500–7800)
Neutrophils Relative %: 42.2 %
Platelets: 241 10*3/uL (ref 140–400)
RBC: 4.14 10*6/uL (ref 3.80–5.10)
RDW: 13.5 % (ref 11.0–15.0)
Total Lymphocyte: 26.2 %
WBC mixed population: 730 cells/uL (ref 200–950)
WBC: 10 10*3/uL (ref 3.8–10.8)

## 2018-07-14 LAB — BASIC METABOLIC PANEL
BUN / CREAT RATIO: 20 (calc) (ref 6–22)
BUN: 47 mg/dL — AB (ref 7–25)
CHLORIDE: 102 mmol/L (ref 98–110)
CO2: 27 mmol/L (ref 20–32)
CREATININE: 2.36 mg/dL — AB (ref 0.60–0.93)
Calcium: 9.6 mg/dL (ref 8.6–10.4)
Glucose, Bld: 164 mg/dL — ABNORMAL HIGH (ref 65–99)
POTASSIUM: 4.8 mmol/L (ref 3.5–5.3)
SODIUM: 137 mmol/L (ref 135–146)

## 2018-07-17 ENCOUNTER — Telehealth: Payer: Self-pay | Admitting: Cardiology

## 2018-07-17 ENCOUNTER — Other Ambulatory Visit: Payer: Self-pay | Admitting: Osteopathic Medicine

## 2018-07-17 DIAGNOSIS — B0223 Postherpetic polyneuropathy: Secondary | ICD-10-CM

## 2018-07-17 NOTE — Telephone Encounter (Signed)
Patient needs nephrology consult. Clearance was answered in the phone note by Richardson Dopp on October 2. Kirk Ruths, MD

## 2018-07-17 NOTE — Telephone Encounter (Signed)
Patient was notified of lab results. Results have been routed to PCP  Patient does not have a nephrologist - referral?  Patient is inquiring about clearance - see phone note 10/2 for reference - and would like to know status of this.

## 2018-07-17 NOTE — Telephone Encounter (Signed)
-----   Message from Lelon Perla, MD sent at 07/15/2018  9:01 AM EDT ----- Make sure pt is followed by nephrology; she should fu with primary care for elevated eosinophil count Briana Werner

## 2018-07-17 NOTE — Telephone Encounter (Signed)
Patient relayed info per Kathleen Argue, PA on 10/15 that was also routed to dentist on this date. Advised her to call dentist  Also notified her of need for referral to nephrologist - she prefers an MD in [place]. Will route to [employer] for assistance with this.

## 2018-07-18 NOTE — Telephone Encounter (Signed)
Follow up    Briana Werner is calling to check on the status of clearance because they wanted some additional diagnosis information.  She states that she did send in the medical release paperwork. Please call to discuss.    Request for surgical clearance:  1. What type of surgery is being performed? SURGICAL REMOVAL OF TOOTH #29  2. When is this surgery scheduled? TBD  3. What type of clearance is required (medical clearance vs. Pharmacy clearance to hold med vs. Both)? BOTH  4. Are there any medications that need to be held prior to surgery and how long? ELIQUIS  5. Practice name and name of physician performing surgery? [facility] DR Helene Kel[facility]  6. What is your office phone number [phone]   7.   What is your office fax number [phone]  8.   Anesthesia type (None, local, MAC, general) ? LOCAL WITH EPINEPHRINE

## 2018-07-18 NOTE — Telephone Encounter (Signed)
[facility] not do refills for this provider.

## 2018-07-18 NOTE — Telephone Encounter (Signed)
Medical release form has been received.  Information has been sent to requesting office.

## 2018-07-24 ENCOUNTER — Telehealth: Payer: Self-pay | Admitting: Osteopathic Medicine

## 2018-07-24 ENCOUNTER — Telehealth: Payer: Self-pay

## 2018-07-24 MED ORDER — ALBUTEROL SULFATE HFA 108 (90 BASE) MCG/ACT IN AERS: 2.0000 | INHALATION_SPRAY | Freq: Four times a day (QID) | RESPIRATORY_TRACT | 11 refills | Status: AC | PRN

## 2018-07-24 NOTE — Telephone Encounter (Signed)
Sent!

## 2018-07-24 NOTE — Telephone Encounter (Signed)
Pt advised.

## 2018-07-24 NOTE — Telephone Encounter (Signed)
Pt called this morning requesting to change her appt. Pt is scheduled to be seen on 08/04/18 w/Dr. Sheppard Coil. She is requesting to reschedule the appt sometime today or tomorrow. Please call pt with new appt time/date. Thanks in advance.

## 2018-07-24 NOTE — Telephone Encounter (Signed)
Pt called. She has a dental procedure tomorrow to remove a broken tooth. She said Dr Jetta Lout advised her to bring her Advair inhaler with her. Also requested her to bring an albuterol inhaler. She needs an Rx for this. Routing.

## 2018-07-25 NOTE — Telephone Encounter (Signed)
Left patient a voicemail to inform her that she is too soon for her A1C check but if she is having issues we can see her sooner.

## 2018-07-31 NOTE — Telephone Encounter (Signed)
Left message for pt to call, can not find any kidney doctors in [place], ? If she wants to go to [place], [place] or [place].

## 2018-08-01 ENCOUNTER — Other Ambulatory Visit: Payer: Self-pay | Admitting: Osteopathic Medicine

## 2018-08-02 NOTE — Telephone Encounter (Signed)
WOuld call patient and double check if she requested this medicine or if pharmacy has this on auto-refill?

## 2018-08-02 NOTE — Telephone Encounter (Signed)
Patient states that she is due for a refill and I advised her that I would send a refill to the pharmacy. She voices understanding. No further questions during the call.

## 2018-08-02 NOTE — Telephone Encounter (Signed)
The last note from the Rx written on 10/03/2017 says discontinued. Ok to fill? Please advise.

## 2018-08-04 ENCOUNTER — Encounter: Payer: Self-pay | Admitting: Osteopathic Medicine

## 2018-08-04 ENCOUNTER — Ambulatory Visit (INDEPENDENT_AMBULATORY_CARE_PROVIDER_SITE_OTHER): Payer: Medicare Other | Admitting: Osteopathic Medicine

## 2018-08-04 VITALS — BP 156/74 | HR 69 | Wt 155.3 lb

## 2018-08-04 DIAGNOSIS — J449 Chronic obstructive pulmonary disease, unspecified: Secondary | ICD-10-CM | POA: Diagnosis not present

## 2018-08-04 DIAGNOSIS — N184 Chronic kidney disease, stage 4 (severe): Secondary | ICD-10-CM

## 2018-08-04 DIAGNOSIS — Z794 Long term (current) use of insulin: Secondary | ICD-10-CM | POA: Diagnosis not present

## 2018-08-04 DIAGNOSIS — D721 Eosinophilia, unspecified: Secondary | ICD-10-CM

## 2018-08-04 DIAGNOSIS — R053 Chronic cough: Secondary | ICD-10-CM

## 2018-08-04 DIAGNOSIS — R05 Cough: Secondary | ICD-10-CM

## 2018-08-04 DIAGNOSIS — E1169 Type 2 diabetes mellitus with other specified complication: Secondary | ICD-10-CM | POA: Diagnosis not present

## 2018-08-04 DIAGNOSIS — I701 Atherosclerosis of renal artery: Secondary | ICD-10-CM

## 2018-08-04 LAB — POCT GLYCOSYLATED HEMOGLOBIN (HGB A1C): Hemoglobin A1C: 7.1 % — AB (ref 4.0–5.6)

## 2018-08-04 MED ORDER — FAMOTIDINE 40 MG PO TABS: 40.0000 mg | ORAL_TABLET | Freq: Every day | ORAL | 1 refills | Status: DC

## 2018-08-04 MED ORDER — OMEPRAZOLE 40 MG PO CPDR: 40.0000 mg | DELAYED_RELEASE_CAPSULE | Freq: Every day | ORAL | 1 refills | Status: DC

## 2018-08-04 NOTE — Patient Instructions (Signed)
Plan:  We will increase the omeprazole from 20 mg to 40 mg and add Pepcid, medications were sent to your pharmacy.  Hopefully this will help with the cough, if not, a referral is in place to send you to a lung specialist for further evaluation.  Blood work today to monitor kidney function and blood counts, which were a little out of your normal range when cardiology checked last month.

## 2018-08-04 NOTE — Progress Notes (Signed)
HPI: Briana Werner is a 82 y.o. female who  has a past medical history of Asthma, Atrial flutter ([place]), CAD (coronary artery disease), Cerebrovascular disease, Chronic renal insufficiency (02/26/2014), COPD (chronic obstructive pulmonary disease) ([place]) (02/26/2014), Hypothyroid (02/26/2014), Presence of stent in coronary artery in patient with coronary artery disease (02/26/2014), Renal artery stenosis ([place]), and Type 2 diabetes mellitus ([place]) (02/26/2014).  she presents to [facility] today, 08/04/18,  for chief complaint of:  DM2 follow-up  Persistent cough   Cough:  ongoing for years, assoc w/ dysphagia which has been worked up by ENT   last ENT visit was 08/19: laryngospasm and dysphagia, barium swallow had showed possible hypopharyngeal mass and then a modified barium swallow was okay with no laryngeal penetration. She is on omeprazole 20 mg a day. No significant tobacco abuse history.   LARYNGOSCOPY shows normal nasal and nasopharyngeal mucosa. Hypopharynx is completely normal including the vallecula and piriform sinuses. Larynx shows normal vocal cords that move well on phonation. 3+ arytenoid edema. Upper cervical esophagus was intubated and shows no inflammation or signs of yeast infection.   Discussed increase Omeprazole to 40 mg and added Zantac 150 mg bedtime x1 month.   Consider GI referral to evaluate esophagus.   Patient had previously been referred to pulmonology in 2017, does not look like she ever followed up with them.   Elevated eosinophils on recent CBC w/ cardiology   DM2: A1C today at goal for age. Pt denies hypoglycemia. No need for refills at this time.   CKD:  Recent Cr a bit above baseline, GFR ok, pt has made clear she does not want to see a specialist for this, not interested in dialysis - she states now that she might think about seeing nephrology since Dr Stanford Breed mentioned this, too.    Results for orders placed or performed  in visit on 08/04/18 (from the past 24 hour(s))  POCT HgB A1C     Status: Abnormal   Collection Time: 08/04/18 10:56 AM  Result Value Ref Range   Hemoglobin A1C 7.1 (A) 4.0 - 5.6 %   HbA1c POC (<> result, manual entry)     HbA1c, POC (prediabetic range)     HbA1c, POC (controlled diabetic range)     7.2% in 04/2018 8.0% in 10/2017  Recent Results (from the past 2160 hour(s))  Basic metabolic panel     Status: Abnormal   Collection Time: 07/13/18  2:59 PM  Result Value Ref Range   Glucose, Bld 164 (H) 65 - 99 mg/dL    Comment: .            Fasting reference interval . For someone without known diabetes, a glucose value >125 mg/dL indicates that they may have diabetes and this should be confirmed with a follow-up test. .    BUN 47 (H) 7 - 25 mg/dL   Creat 2.36 (H) 0.60 - 0.93 mg/dL    Comment: For patients >51 years of age, the reference limit for Creatinine is approximately 13% higher for people identified as African-American. .    BUN/Creatinine Ratio 20 6 - 22 (calc)   Sodium 137 135 - 146 mmol/L   Potassium 4.8 3.5 - 5.3 mmol/L   Chloride 102 98 - 110 mmol/L   CO2 27 20 - 32 mmol/L   Calcium 9.6 8.6 - 10.4 mg/dL  CBC with Differential     Status: Abnormal   Collection Time: 07/13/18  3:06 PM  Result Value Ref  Range   WBC 10.0 3.8 - 10.8 Thousand/uL   RBC 4.14 3.80 - 5.10 Million/uL   Hemoglobin 12.2 11.7 - 15.5 g/dL   HCT 36.2 35.0 - 45.0 %   MCV 87.4 80.0 - 100.0 fL   MCH 29.5 27.0 - 33.0 pg   MCHC 33.7 32.0 - 36.0 g/dL   RDW 13.5 11.0 - 15.0 %   Platelets 241 140 - 400 Thousand/uL   MPV 10.6 7.5 - 12.5 fL   Neutro Abs 4,220 1,500 - 7,800 cells/uL   Lymphs Abs 2,620 850 - 3,900 cells/uL   WBC mixed population 730 200 - 950 cells/uL   Eosinophils Absolute 2,360 (H) 15 - 500 cells/uL   Basophils Absolute 70 0 - 200 cells/uL   Neutrophils Relative % 42.2 %   Total Lymphocyte 26.2 %   Monocytes Relative 7.3 %   Eosinophils Relative 23.6 %   Basophils  Relative 0.7 %  POCT HgB A1C     Status: Abnormal   Collection Time: 08/04/18 10:56 AM  Result Value Ref Range   Hemoglobin A1C 7.1 (A) 4.0 - 5.6 %   HbA1c POC (<> result, manual entry)     HbA1c, POC (prediabetic range)     HbA1c, POC (controlled diabetic range)        At today's visit... Past medical history, surgical history, and family history reviewed and updated as needed.  Current medication list and allergy/intolerance information reviewed and updated as needed. (See remainder of HPI, ROS, Phys Exam below)            ASSESSMENT/PLAN: The primary encounter diagnosis was Type 2 diabetes mellitus with other specified complication, with long-term current use of insulin ([place]). Diagnoses of CKD (chronic kidney disease) stage 4, GFR 15-29 ml/min (HCC), Eosinophilia, Chronic cough, and Chronic obstructive pulmonary disease, unspecified COPD type ([place]) were also pertinent to this visit.   Orders Placed This Encounter  Procedures  . BASIC METABOLIC PANEL WITH GFR  . CBC with Differential/Platelet  . Ambulatory referral to Pulmonology  . Ambulatory referral to Nephrology  . POCT HgB A1C     Meds ordered this encounter  Medications  . famotidine (PEPCID) 40 MG tablet    Sig: Take 1 tablet (40 mg total) by mouth daily.    Dispense:  90 tablet    Refill:  1  . omeprazole (PRILOSEC) 40 MG capsule    Sig: Take 1 capsule (40 mg total) by mouth daily.    Dispense:  90 capsule    Refill:  1    CANCEL 20 MG THANKS    Patient Instructions  Plan:  We will increase the omeprazole from 20 mg to 40 mg and add Pepcid, medications were sent to your pharmacy.  Hopefully this will help with the cough, if not, a referral is in place to send you to a lung specialist for further evaluation.  Blood work today to monitor kidney function and blood counts, which were a little out of your normal range when cardiology checked last month.     Follow-up plan: Return in about 3 months  (around 11/04/2018) for A1C / DIABETES MONITORING, sooner if needed .                               ############################################ ############################################ ############################################ ############################################    Current Meds  Medication Sig  . ADVAIR DISKUS 500-50 MCG/DOSE AEPB INHALE 1 PUFF INTO THE LUNGS TWICE DAILY  .  albuterol (PROVENTIL HFA;VENTOLIN HFA) 108 (90 Base) MCG/ACT inhaler Inhale 2 puffs into the lungs every 6 (six) hours as needed for wheezing.  Marland Kitchen apixaban (ELIQUIS) 2.5 MG TABS tablet Take 1 tablet (2.5 mg total) by mouth 2 (two) times daily.  . Calcium Carbonate-Vitamin D (CALTRATE 600+D PO) Take by mouth 2 (two) times daily.  . carvedilol (COREG) 12.5 MG tablet TAKE 1 TABLET(12.5 MG) BY MOUTH TWICE DAILY WITH A MEAL  . cetirizine (ZYRTEC) 10 MG tablet Take 1 tablet (10 mg total) by mouth at bedtime.  . diclofenac sodium (VOLTAREN) 1 % GEL Apply 4 g topically 4 (four) times daily. To affected joint.  Marland Kitchen escitalopram (LEXAPRO) 5 MG tablet Take 1 tablet (5 mg total) by mouth daily. Pt must keep appt on 08/04/18 for refills.  . FeFum-FePoly-FA-B Cmp-C-Biot (INTEGRA PLUS) CAPS TAKE 1 CAPSULE BY MOUTH DAILY  . gabapentin (NEURONTIN) 300 MG capsule TAKE 1 CAPSULE(300 MG) BY MOUTH THREE TIMES DAILY  . glucose blood (ONE TOUCH ULTRA TEST) test strip Check blood sugar up to 4 times daily. Dx DM ICD 10 E11.9 insulin dependent.  . Insulin Glargine (TOUJEO SOLOSTAR) 300 UNIT/ML SOPN Inject 10 Units into the skin daily.  . Insulin Pen Needle (BD PEN NEEDLE NANO U/F) 32G X 4 MM MISC Inject into skin once daily. Use with insulin pen. DX DM ICD-10 E11.22  . ipratropium-albuterol (DUONEB) 0.5-2.5 (3) MG/3ML SOLN Take 3 mLs by nebulization every 2 (two) hours as needed (wheeze, SOB, cough).  . IRON PO Take by mouth.  Marland Kitchen JANUVIA 50 MG tablet TAKE 1 TABLET BY MOUTH EVERY DAY  . levothyroxine  (SYNTHROID, LEVOTHROID) 88 MCG tablet TAKE 1 TABLET BY MOUTH DAILY BEFORE BREAKFAST  . losartan (COZAAR) 100 MG tablet Take 1 tablet (100 mg total) by mouth daily.  . montelukast (SINGULAIR) 10 MG tablet TAKE 1 TABLET BY MOUTH AT BEDTIME  . Multiple Vitamin (MULTIVITAMIN) capsule Take 1 capsule by mouth.  Marland Kitchen omeprazole (PRILOSEC) 20 MG capsule TAKE 1 CAPSULE BY MOUTH TWICE DAILY    Allergies  Allergen Reactions  . Tetracycline Shortness Of Breath  . Tetracyclines & Related Anaphylaxis and Shortness Of Breath    dizziness  . Amlodipine Other (See Comments)    Edema   . Amoxicillin Other (See Comments)    Questionable rash   . Latex Dermatitis    Bandages, pulls skin  . Other Dermatitis    Bandaids    . Sulfamethoxazole-Trimethoprim Other (See Comments)    dizzy  dizzy  . Sulfa Antibiotics Rash  . Tape Dermatitis and Rash    Bandaids       Review of Systems:  Constitutional: No recent illness  HEENT: No  headache, no vision change  Cardiac: No  chest pain, No  pressure, No palpitations  Respiratory:  No  shortness of breath. +Cough  Gastrointestinal: No  abdominal pain, no change on bowel habits  Musculoskeletal: No new myalgia/arthralgia  Neurologic: No  weakness, No  Dizziness  Psychiatric: No  concerns with depression, No  concerns with anxiety  Exam:  BP (!) 156/74 (BP Location: Left Arm, Patient Position: Sitting, Cuff Size: Normal)   Pulse 69   Wt 155 lb 4.8 oz (70.4 kg)   LMP  (LMP Unknown)   SpO2 94%   BMI 24.32 kg/m   Constitutional: VS see above. General Appearance: alert, well-developed, well-nourished, NAD  Eyes: Normal lids and conjunctive, non-icteric sclera  Ears, Nose, Mouth, Throat: MMM, Normal external inspection ears/nares/mouth/lips/gums.  Neck: No  masses, trachea midline.   Respiratory: Normal respiratory effort. no wheeze, no rhonchi, no rales  Cardiovascular: S1/S2 normal, no murmur, no rub/gallop auscultated. RRR.    Musculoskeletal: Gait normal- ambulating w/ walker. Symmetric and independent movement of all extremities  Neurological: Normal balance/coordination. No tremor.  Skin: warm, dry, intact.   Psychiatric: Normal judgment/insight. Normal mood and affect. Oriented x3.       Visit summary with medication list and pertinent instructions was printed for patient to review, patient was advised to alert [place] if any updates are needed. All questions at time of visit were answered - patient instructed to contact office with any additional concerns. ER/RTC precautions were reviewed with the patient and understanding verbalized.      Please note: voice recognition software was used to produce this document, and typos may escape review. Please contact Dr. Sheppard Coil for any needed clarifications.    Follow up plan: Return in about 3 months (around 11/04/2018) for A1C / DIABETES MONITORING, sooner if needed .

## 2018-08-05 NOTE — Addendum Note (Signed)
Addended by: Maryla Morrow on: 08/05/2018 12:32 PM   Modules accepted: Orders

## 2018-08-07 LAB — CBC WITH DIFFERENTIAL/PLATELET
Basophils Absolute: 59 cells/uL (ref 0–200)
Basophils Relative: 0.6 %
EOS PCT: 28.3 %
Eosinophils Absolute: 2773 cells/uL — ABNORMAL HIGH (ref 15–500)
HCT: 38.3 % (ref 35.0–45.0)
HEMOGLOBIN: 12.7 g/dL (ref 11.7–15.5)
LYMPHS ABS: 1891 {cells}/uL (ref 850–3900)
MCH: 29.6 pg (ref 27.0–33.0)
MCHC: 33.2 g/dL (ref 32.0–36.0)
MCV: 89.3 fL (ref 80.0–100.0)
MONOS PCT: 8.3 %
MPV: 10.3 fL (ref 7.5–12.5)
NEUTROS ABS: 4263 {cells}/uL (ref 1500–7800)
Neutrophils Relative %: 43.5 %
Platelets: 229 10*3/uL (ref 140–400)
RBC: 4.29 10*6/uL (ref 3.80–5.10)
RDW: 13.5 % (ref 11.0–15.0)
Total Lymphocyte: 19.3 %
WBC mixed population: 813 cells/uL (ref 200–950)
WBC: 9.8 10*3/uL (ref 3.8–10.8)

## 2018-08-07 LAB — BASIC METABOLIC PANEL WITH GFR
BUN/Creatinine Ratio: 22 (calc) (ref 6–22)
BUN: 41 mg/dL — ABNORMAL HIGH (ref 7–25)
CALCIUM: 9.9 mg/dL (ref 8.6–10.4)
CHLORIDE: 101 mmol/L (ref 98–110)
CO2: 29 mmol/L (ref 20–32)
Creat: 1.86 mg/dL — ABNORMAL HIGH (ref 0.60–0.88)
GFR, Est African American: 29 mL/min/{1.73_m2} — ABNORMAL LOW (ref >=60)
GFR, Est Non African American: 25 mL/min/{1.73_m2} — ABNORMAL LOW (ref >=60)
GLUCOSE: 105 mg/dL — AB (ref 65–99)
Potassium: 4.5 mmol/L (ref 3.5–5.3)
Sodium: 136 mmol/L (ref 135–146)

## 2018-08-07 LAB — TEST AUTHORIZATION

## 2018-08-07 LAB — PATHOLOGIST SMEAR REVIEW

## 2018-08-20 ENCOUNTER — Other Ambulatory Visit: Payer: Self-pay | Admitting: Osteopathic Medicine

## 2018-08-21 DIAGNOSIS — N183 Chronic kidney disease, stage 3 (moderate): Secondary | ICD-10-CM | POA: Diagnosis not present

## 2018-08-21 DIAGNOSIS — R918 Other nonspecific abnormal finding of lung field: Secondary | ICD-10-CM | POA: Diagnosis not present

## 2018-08-21 DIAGNOSIS — I5032 Chronic diastolic (congestive) heart failure: Secondary | ICD-10-CM | POA: Diagnosis not present

## 2018-08-21 DIAGNOSIS — R52 Pain, unspecified: Secondary | ICD-10-CM | POA: Diagnosis not present

## 2018-08-21 DIAGNOSIS — M25552 Pain in left hip: Secondary | ICD-10-CM | POA: Diagnosis not present

## 2018-08-21 DIAGNOSIS — Z96652 Presence of left artificial knee joint: Secondary | ICD-10-CM | POA: Diagnosis not present

## 2018-08-21 DIAGNOSIS — S79912A Unspecified injury of left hip, initial encounter: Secondary | ICD-10-CM | POA: Diagnosis not present

## 2018-08-21 DIAGNOSIS — S0990XA Unspecified injury of head, initial encounter: Secondary | ICD-10-CM | POA: Diagnosis not present

## 2018-08-21 DIAGNOSIS — M545 Low back pain: Secondary | ICD-10-CM | POA: Diagnosis not present

## 2018-08-21 DIAGNOSIS — G9389 Other specified disorders of brain: Secondary | ICD-10-CM | POA: Diagnosis not present

## 2018-08-21 DIAGNOSIS — N179 Acute kidney failure, unspecified: Secondary | ICD-10-CM | POA: Diagnosis not present

## 2018-08-21 DIAGNOSIS — R55 Syncope and collapse: Secondary | ICD-10-CM | POA: Diagnosis not present

## 2018-08-21 DIAGNOSIS — M2550 Pain in unspecified joint: Secondary | ICD-10-CM | POA: Diagnosis not present

## 2018-08-21 DIAGNOSIS — G8911 Acute pain due to trauma: Secondary | ICD-10-CM | POA: Diagnosis not present

## 2018-08-21 DIAGNOSIS — I517 Cardiomegaly: Secondary | ICD-10-CM | POA: Diagnosis not present

## 2018-08-21 DIAGNOSIS — I130 Hypertensive heart and chronic kidney disease with heart failure and stage 1 through stage 4 chronic kidney disease, or unspecified chronic kidney disease: Secondary | ICD-10-CM | POA: Diagnosis not present

## 2018-08-21 DIAGNOSIS — I509 Heart failure, unspecified: Secondary | ICD-10-CM | POA: Diagnosis not present

## 2018-08-21 DIAGNOSIS — M47896 Other spondylosis, lumbar region: Secondary | ICD-10-CM | POA: Diagnosis not present

## 2018-08-21 DIAGNOSIS — R531 Weakness: Secondary | ICD-10-CM | POA: Diagnosis not present

## 2018-08-21 DIAGNOSIS — G319 Degenerative disease of nervous system, unspecified: Secondary | ICD-10-CM | POA: Diagnosis not present

## 2018-08-21 DIAGNOSIS — M79605 Pain in left leg: Secondary | ICD-10-CM | POA: Diagnosis not present

## 2018-08-21 DIAGNOSIS — M4856XA Collapsed vertebra, not elsewhere classified, lumbar region, initial encounter for fracture: Secondary | ICD-10-CM | POA: Diagnosis not present

## 2018-08-21 DIAGNOSIS — N3000 Acute cystitis without hematuria: Secondary | ICD-10-CM | POA: Diagnosis not present

## 2018-08-21 DIAGNOSIS — M4316 Spondylolisthesis, lumbar region: Secondary | ICD-10-CM | POA: Diagnosis not present

## 2018-08-21 DIAGNOSIS — R9082 White matter disease, unspecified: Secondary | ICD-10-CM | POA: Diagnosis not present

## 2018-08-21 DIAGNOSIS — Z96642 Presence of left artificial hip joint: Secondary | ICD-10-CM | POA: Diagnosis not present

## 2018-08-21 DIAGNOSIS — W19XXXA Unspecified fall, initial encounter: Secondary | ICD-10-CM | POA: Diagnosis not present

## 2018-08-21 DIAGNOSIS — R0989 Other specified symptoms and signs involving the circulatory and respiratory systems: Secondary | ICD-10-CM | POA: Diagnosis not present

## 2018-08-21 DIAGNOSIS — R51 Headache: Secondary | ICD-10-CM | POA: Diagnosis not present

## 2018-08-22 DIAGNOSIS — I130 Hypertensive heart and chronic kidney disease with heart failure and stage 1 through stage 4 chronic kidney disease, or unspecified chronic kidney disease: Secondary | ICD-10-CM | POA: Diagnosis present

## 2018-08-22 DIAGNOSIS — N183 Chronic kidney disease, stage 3 (moderate): Secondary | ICD-10-CM | POA: Diagnosis not present

## 2018-08-22 DIAGNOSIS — E782 Mixed hyperlipidemia: Secondary | ICD-10-CM | POA: Diagnosis present

## 2018-08-22 DIAGNOSIS — R55 Syncope and collapse: Secondary | ICD-10-CM | POA: Diagnosis not present

## 2018-08-22 DIAGNOSIS — G301 Alzheimer's disease with late onset: Secondary | ICD-10-CM | POA: Diagnosis present

## 2018-08-22 DIAGNOSIS — I5032 Chronic diastolic (congestive) heart failure: Secondary | ICD-10-CM | POA: Diagnosis present

## 2018-08-22 DIAGNOSIS — I2510 Atherosclerotic heart disease of native coronary artery without angina pectoris: Secondary | ICD-10-CM | POA: Diagnosis present

## 2018-08-22 DIAGNOSIS — F0280 Dementia in other diseases classified elsewhere without behavioral disturbance: Secondary | ICD-10-CM | POA: Diagnosis present

## 2018-08-22 DIAGNOSIS — Z91040 Latex allergy status: Secondary | ICD-10-CM | POA: Diagnosis not present

## 2018-08-22 DIAGNOSIS — Z880 Allergy status to penicillin: Secondary | ICD-10-CM | POA: Diagnosis not present

## 2018-08-22 DIAGNOSIS — N3000 Acute cystitis without hematuria: Secondary | ICD-10-CM | POA: Diagnosis not present

## 2018-08-22 DIAGNOSIS — M545 Low back pain: Secondary | ICD-10-CM | POA: Diagnosis not present

## 2018-08-22 DIAGNOSIS — I509 Heart failure, unspecified: Secondary | ICD-10-CM | POA: Diagnosis not present

## 2018-08-22 DIAGNOSIS — J432 Centrilobular emphysema: Secondary | ICD-10-CM | POA: Diagnosis present

## 2018-08-22 DIAGNOSIS — E1142 Type 2 diabetes mellitus with diabetic polyneuropathy: Secondary | ICD-10-CM | POA: Diagnosis present

## 2018-08-22 DIAGNOSIS — Z794 Long term (current) use of insulin: Secondary | ICD-10-CM | POA: Diagnosis not present

## 2018-08-22 DIAGNOSIS — Z91048 Other nonmedicinal substance allergy status: Secondary | ICD-10-CM | POA: Diagnosis not present

## 2018-08-22 DIAGNOSIS — Z881 Allergy status to other antibiotic agents status: Secondary | ICD-10-CM | POA: Diagnosis not present

## 2018-08-22 DIAGNOSIS — Z7901 Long term (current) use of anticoagulants: Secondary | ICD-10-CM | POA: Diagnosis not present

## 2018-08-22 DIAGNOSIS — Z882 Allergy status to sulfonamides status: Secondary | ICD-10-CM | POA: Diagnosis not present

## 2018-08-22 DIAGNOSIS — N179 Acute kidney failure, unspecified: Secondary | ICD-10-CM | POA: Diagnosis not present

## 2018-08-22 DIAGNOSIS — E039 Hypothyroidism, unspecified: Secondary | ICD-10-CM | POA: Diagnosis present

## 2018-08-22 DIAGNOSIS — E1122 Type 2 diabetes mellitus with diabetic chronic kidney disease: Secondary | ICD-10-CM | POA: Diagnosis not present

## 2018-08-22 DIAGNOSIS — Z951 Presence of aortocoronary bypass graft: Secondary | ICD-10-CM | POA: Diagnosis not present

## 2018-08-22 DIAGNOSIS — I6523 Occlusion and stenosis of bilateral carotid arteries: Secondary | ICD-10-CM | POA: Diagnosis present

## 2018-08-22 NOTE — Progress Notes (Signed)
Letter with results mailed.

## 2018-08-24 MED ORDER — OXYBUTYNIN CHLORIDE ER 5 MG PO TB24
5.00 | ORAL_TABLET | ORAL | Status: DC
Start: 2018-08-23 — End: 2018-08-24

## 2018-08-24 MED ORDER — PANTOPRAZOLE SODIUM 40 MG PO TBEC
40.00 | DELAYED_RELEASE_TABLET | ORAL | Status: DC
Start: 2018-08-24 — End: 2018-08-24

## 2018-08-24 MED ORDER — NITROGLYCERIN 0.4 MG SL SUBL
0.40 | SUBLINGUAL_TABLET | SUBLINGUAL | Status: DC
Start: ? — End: 2018-08-24

## 2018-08-24 MED ORDER — ALBUTEROL SULFATE (2.5 MG/3ML) 0.083% IN NEBU
2.50 | INHALATION_SOLUTION | RESPIRATORY_TRACT | Status: DC
Start: ? — End: 2018-08-24

## 2018-08-24 MED ORDER — INSULIN GLARGINE 100 UNIT/ML ~~LOC~~ SOLN
1.00 | SUBCUTANEOUS | Status: DC
Start: ? — End: 2018-08-24

## 2018-08-24 MED ORDER — INSULIN GLARGINE 100 UNIT/ML ~~LOC~~ SOLN
1.00 | SUBCUTANEOUS | Status: DC
Start: 2018-08-23 — End: 2018-08-24

## 2018-08-24 MED ORDER — LORATADINE 10 MG PO TABS
10.00 | ORAL_TABLET | ORAL | Status: DC
Start: 2018-08-24 — End: 2018-08-24

## 2018-08-24 MED ORDER — ALUM & MAG HYDROXIDE-SIMETH 200-200-20 MG/5ML PO SUSP
30.00 | ORAL | Status: DC
Start: ? — End: 2018-08-24

## 2018-08-24 MED ORDER — ESCITALOPRAM OXALATE 10 MG PO TABS
5.00 | ORAL_TABLET | ORAL | Status: DC
Start: 2018-08-24 — End: 2018-08-24

## 2018-08-24 MED ORDER — FAMOTIDINE 20 MG PO TABS
40.00 | ORAL_TABLET | ORAL | Status: DC
Start: 2018-08-24 — End: 2018-08-24

## 2018-08-24 MED ORDER — SODIUM CHLORIDE 0.9 % IV SOLN
10.00 | INTRAVENOUS | Status: DC
Start: ? — End: 2018-08-24

## 2018-08-24 MED ORDER — INSULIN LISPRO 100 UNIT/ML ~~LOC~~ SOLN
1.00 | SUBCUTANEOUS | Status: DC
Start: ? — End: 2018-08-24

## 2018-08-24 MED ORDER — INSULIN LISPRO 100 UNIT/ML ~~LOC~~ SOLN
1.00 | SUBCUTANEOUS | Status: DC
Start: 2018-08-23 — End: 2018-08-24

## 2018-08-24 MED ORDER — MAGNESIUM HYDROXIDE 400 MG/5ML PO SUSP
15.00 | ORAL | Status: DC
Start: ? — End: 2018-08-24

## 2018-08-24 MED ORDER — MONTELUKAST SODIUM 10 MG PO TABS
10.00 | ORAL_TABLET | ORAL | Status: DC
Start: 2018-08-23 — End: 2018-08-24

## 2018-08-24 MED ORDER — LEVOTHYROXINE SODIUM 88 MCG PO TABS
88.00 | ORAL_TABLET | ORAL | Status: DC
Start: 2018-08-24 — End: 2018-08-24

## 2018-08-24 MED ORDER — GENERIC EXTERNAL MEDICATION
10.00 | Status: DC
Start: ? — End: 2018-08-24

## 2018-08-24 MED ORDER — ACETAMINOPHEN 325 MG PO TABS
650.00 | ORAL_TABLET | ORAL | Status: DC
Start: ? — End: 2018-08-24

## 2018-08-24 MED ORDER — GENERIC EXTERNAL MEDICATION
1.00 | Status: DC
Start: ? — End: 2018-08-24

## 2018-08-24 MED ORDER — SITAGLIPTIN PHOSPHATE 50 MG PO TABS
50.00 | ORAL_TABLET | ORAL | Status: DC
Start: 2018-08-24 — End: 2018-08-24

## 2018-08-24 MED ORDER — BUDESONIDE-FORMOTEROL FUMARATE 160-4.5 MCG/ACT IN AERO
2.00 | INHALATION_SPRAY | RESPIRATORY_TRACT | Status: DC
Start: 2018-08-23 — End: 2018-08-24

## 2018-08-24 MED ORDER — DICLOFENAC SODIUM 1 % TD GEL
2.00 | TRANSDERMAL | Status: DC
Start: ? — End: 2018-08-24

## 2018-08-24 MED ORDER — GENERIC EXTERNAL MEDICATION
4.00 | Status: DC
Start: ? — End: 2018-08-24

## 2018-08-24 MED ORDER — APIXABAN 5 MG PO TABS
5.00 | ORAL_TABLET | ORAL | Status: DC
Start: 2018-08-23 — End: 2018-08-24

## 2018-08-24 MED ORDER — CARVEDILOL 12.5 MG PO TABS
12.50 | ORAL_TABLET | ORAL | Status: DC
Start: 2018-08-23 — End: 2018-08-24

## 2018-08-24 MED ORDER — GABAPENTIN 300 MG PO CAPS
300.00 | ORAL_CAPSULE | ORAL | Status: DC
Start: 2018-08-23 — End: 2018-08-24

## 2018-08-24 MED ORDER — ATORVASTATIN CALCIUM 40 MG PO TABS
40.00 | ORAL_TABLET | ORAL | Status: DC
Start: 2018-08-24 — End: 2018-08-24

## 2018-08-25 DIAGNOSIS — M545 Low back pain: Secondary | ICD-10-CM | POA: Diagnosis not present

## 2018-08-25 DIAGNOSIS — J432 Centrilobular emphysema: Secondary | ICD-10-CM | POA: Diagnosis not present

## 2018-08-25 DIAGNOSIS — E1142 Type 2 diabetes mellitus with diabetic polyneuropathy: Secondary | ICD-10-CM | POA: Diagnosis not present

## 2018-08-25 DIAGNOSIS — I130 Hypertensive heart and chronic kidney disease with heart failure and stage 1 through stage 4 chronic kidney disease, or unspecified chronic kidney disease: Secondary | ICD-10-CM | POA: Diagnosis not present

## 2018-08-25 DIAGNOSIS — N183 Chronic kidney disease, stage 3 (moderate): Secondary | ICD-10-CM | POA: Diagnosis not present

## 2018-08-25 DIAGNOSIS — M25552 Pain in left hip: Secondary | ICD-10-CM | POA: Diagnosis not present

## 2018-08-25 DIAGNOSIS — G8911 Acute pain due to trauma: Secondary | ICD-10-CM | POA: Diagnosis not present

## 2018-08-25 DIAGNOSIS — I5032 Chronic diastolic (congestive) heart failure: Secondary | ICD-10-CM | POA: Diagnosis not present

## 2018-08-25 DIAGNOSIS — E1122 Type 2 diabetes mellitus with diabetic chronic kidney disease: Secondary | ICD-10-CM | POA: Diagnosis not present

## 2018-08-25 DIAGNOSIS — I2510 Atherosclerotic heart disease of native coronary artery without angina pectoris: Secondary | ICD-10-CM | POA: Diagnosis not present

## 2018-08-28 ENCOUNTER — Telehealth: Payer: Self-pay

## 2018-08-28 NOTE — Telephone Encounter (Signed)
Briana Werner called and left a message for a return call. She tried to leave a number but was cut off. I called the number in the chart and left a message for patient to call back.

## 2018-08-31 ENCOUNTER — Ambulatory Visit (INDEPENDENT_AMBULATORY_CARE_PROVIDER_SITE_OTHER): Payer: Medicare Other | Admitting: Osteopathic Medicine

## 2018-08-31 ENCOUNTER — Encounter: Payer: Self-pay | Admitting: Osteopathic Medicine

## 2018-08-31 DIAGNOSIS — W19XXXD Unspecified fall, subsequent encounter: Secondary | ICD-10-CM

## 2018-08-31 DIAGNOSIS — M545 Low back pain: Secondary | ICD-10-CM | POA: Diagnosis not present

## 2018-08-31 DIAGNOSIS — M25552 Pain in left hip: Secondary | ICD-10-CM | POA: Diagnosis not present

## 2018-08-31 DIAGNOSIS — I130 Hypertensive heart and chronic kidney disease with heart failure and stage 1 through stage 4 chronic kidney disease, or unspecified chronic kidney disease: Secondary | ICD-10-CM | POA: Diagnosis not present

## 2018-08-31 DIAGNOSIS — G8911 Acute pain due to trauma: Secondary | ICD-10-CM | POA: Diagnosis not present

## 2018-08-31 DIAGNOSIS — I2510 Atherosclerotic heart disease of native coronary artery without angina pectoris: Secondary | ICD-10-CM | POA: Diagnosis not present

## 2018-08-31 DIAGNOSIS — R779 Abnormality of plasma protein, unspecified: Secondary | ICD-10-CM

## 2018-08-31 DIAGNOSIS — I5042 Chronic combined systolic (congestive) and diastolic (congestive) heart failure: Secondary | ICD-10-CM

## 2018-08-31 DIAGNOSIS — E1122 Type 2 diabetes mellitus with diabetic chronic kidney disease: Secondary | ICD-10-CM | POA: Diagnosis not present

## 2018-08-31 DIAGNOSIS — N184 Chronic kidney disease, stage 4 (severe): Secondary | ICD-10-CM

## 2018-08-31 NOTE — Progress Notes (Signed)
HPI: Briana Werner is a 82 y.o. female who  has a past medical history of Asthma, Atrial flutter ([place]), CAD (coronary artery disease), Cerebrovascular disease, Chronic renal insufficiency (02/26/2014), COPD (chronic obstructive pulmonary disease) ([place]) (02/26/2014), Hypothyroid (02/26/2014), Presence of stent in coronary artery in patient with coronary artery disease (02/26/2014), Renal artery stenosis ([place]), and Type 2 diabetes mellitus ([place]) (02/26/2014).  she presents to [facility] today, 08/31/18,  for chief complaint of: Hospital follow-up  Woke up on the floor, no idea how she got there. No phone in her reach. Had to crawl and she hurt L side of leg. Took some time but she remembered about the call button around her neck and alerted this service, was taken to hospital.   Discharge summary reviewed. Was told she had UTI, this was treated in the hospital. Concern for electrolyte abnormalities, ARF/dehydration.   Home health w/ PT was advised.  Nurse is coming for wellness checks.   Ms [name] overall feels better.     Past medical, surgical, social and family history reviewed:  Patient Active Problem List   Diagnosis Date Noted  . Globus sensation 04/13/2018  . Claudication in peripheral vascular disease ([place]) 03/17/2018  . Dysphagia 01/30/2018  . Pulmonary hypertension, primary ([place]) 01/30/2018  . CKD (chronic kidney disease) stage 4, GFR 15-29 ml/min (HCC) 02/25/2017  . Nasal polyposis 12/07/2016  . Cough 07/13/2016  . Tinea pedis 05/25/2016  . Diabetic foot ulcer ([place]) 05/25/2016  . Left hip pain 04/15/2016  . Lumbar radicular pain 01/15/2016  . Aortic atherosclerosis ([place]) 01/12/2016  . Late onset Alzheimer's disease without behavioral disturbance ([place]) 02/27/2015  . Anemia 02/16/2015  . Atrial fibrillation ([place]) 12/25/2014  . Diabetes mellitus with kidney complication ([place]) 79/48/0165  . Impaired mobility and ADLs 07/23/2014  .  Spondylisthesis 05/31/2014  . CAD (coronary artery disease) 05/01/2014  . Renal artery stenosis ([place]) 05/01/2014  . Generalized anxiety disorder 04/03/2014  . CKD (chronic kidney disease) stage 3, GFR 30-59 ml/min (HCC) 03/26/2014  . Chronic atrial flutter ([place]) 03/19/2014  . Carotid artery stenosis 03/19/2014  . Hypercalcemia 02/27/2014  . Essential hypertension, benign 02/26/2014  . Hypothyroid 02/26/2014  . Hyperlipidemia 02/26/2014  . COPD (chronic obstructive pulmonary disease) ([place]) 02/26/2014  . Dementia ([place]) 02/26/2014  . Presence of stent in coronary artery in patient with coronary artery disease 02/26/2014  . Nephrolithiasis 02/26/2014  . Diabetic peripheral neuropathy ([place]) 02/26/2014  . DVT, lower extremity, recurrent ([place]) 02/26/2014  . Status post left hip replacement 02/26/2014  . Overactive bladder 02/26/2014  . Chronic diastolic congestive heart failure, NYHA class 2 ([place]) 03/04/2012    Past Surgical History:  Procedure Laterality Date  . ABDOMINAL HYSTERECTOMY    . APPENDECTOMY    . BACK SURGERY    . CHOLECYSTECTOMY    . CORONARY ARTERY BYPASS GRAFT     2004, [place]  . Hip replacement    . KNEE SURGERY Bilateral   . NASAL SINUS SURGERY    . SHOULDER SURGERY    . TONSILLECTOMY      Social History   Tobacco Use  . Smoking status: Never Smoker  . Smokeless tobacco: Never Used  Substance Use Topics  . Alcohol use: No    Family History  Problem Relation Age of Onset  . CAD Father   . Heart attack Father      Current medication list and allergy/intolerance information reviewed:    Current Outpatient Medications  Medication Sig Dispense Refill  .  ADVAIR DISKUS 500-50 MCG/DOSE AEPB INHALE 1 PUFF INTO THE LUNGS TWICE DAILY 60 each 2  . albuterol (PROVENTIL HFA;VENTOLIN HFA) 108 (90 Base) MCG/ACT inhaler Inhale 2 puffs into the lungs every 6 (six) hours as needed for wheezing. 2 Inhaler 11  . apixaban (ELIQUIS) 2.5 MG TABS tablet Take 1 tablet (2.5  mg total) by mouth 2 (two) times daily. 180 tablet 3  . Calcium Carbonate-Vitamin D (CALTRATE 600+D PO) Take by mouth 2 (two) times daily.    . carvedilol (COREG) 12.5 MG tablet TAKE 1 TABLET(12.5 MG) BY MOUTH TWICE DAILY WITH A MEAL 180 tablet 1  . cetirizine (ZYRTEC) 10 MG tablet Take 1 tablet (10 mg total) by mouth at bedtime. 90 tablet 3  . diclofenac sodium (VOLTAREN) 1 % GEL Apply 4 g topically 4 (four) times daily. To affected joint. 100 g 11  . escitalopram (LEXAPRO) 5 MG tablet TAKE 1 TABLET(5 MG) BY MOUTH DAILY 30 tablet 0  . famotidine (PEPCID) 40 MG tablet Take 1 tablet (40 mg total) by mouth daily. 90 tablet 1  . FeFum-FePoly-FA-B Cmp-C-Biot (INTEGRA PLUS) CAPS TAKE 1 CAPSULE BY MOUTH DAILY 90 capsule 0  . gabapentin (NEURONTIN) 300 MG capsule TAKE 1 CAPSULE(300 MG) BY MOUTH THREE TIMES DAILY 90 capsule 0  . glucose blood (ONE TOUCH ULTRA TEST) test strip Check blood sugar up to 4 times daily. Dx DM ICD 10 E11.9 insulin dependent. 200 each prn  . Insulin Glargine (TOUJEO SOLOSTAR) 300 UNIT/ML SOPN Inject 10 Units into the skin daily. 15 mL 5  . Insulin Pen Needle (BD PEN NEEDLE NANO U/F) 32G X 4 MM MISC Inject into skin once daily. Use with insulin pen. DX DM ICD-10 E11.22 100 each prn  . ipratropium-albuterol (DUONEB) 0.5-2.5 (3) MG/3ML SOLN Take 3 mLs by nebulization every 2 (two) hours as needed (wheeze, SOB, cough). 60 mL 1  . IRON PO Take by mouth.    Marland Kitchen JANUVIA 50 MG tablet TAKE 1 TABLET BY MOUTH EVERY DAY 30 tablet 0  . levothyroxine (SYNTHROID, LEVOTHROID) 88 MCG tablet TAKE 1 TABLET BY MOUTH DAILY BEFORE BREAKFAST 90 tablet 1  . losartan (COZAAR) 100 MG tablet Take 1 tablet (100 mg total) by mouth daily. 90 tablet 3  . montelukast (SINGULAIR) 10 MG tablet TAKE 1 TABLET BY MOUTH AT BEDTIME 90 tablet 0  . Multiple Vitamin (MULTIVITAMIN) capsule Take 1 capsule by mouth.    Marland Kitchen omeprazole (PRILOSEC) 20 MG capsule TAKE 1 CAPSULE BY MOUTH TWICE DAILY 60 capsule 0  . omeprazole  (PRILOSEC) 40 MG capsule Take 1 capsule (40 mg total) by mouth daily. 90 capsule 1   No current facility-administered medications for this visit.     Allergies  Allergen Reactions  . Tetracycline Shortness Of Breath  . Tetracyclines & Related Anaphylaxis and Shortness Of Breath    dizziness  . Amlodipine Other (See Comments)    Edema   . Amoxicillin Other (See Comments)    Questionable rash   . Latex Dermatitis    Bandages, pulls skin  . Other Dermatitis    Bandaids    . Sulfamethoxazole-Trimethoprim Other (See Comments)    dizzy  dizzy  . Sulfa Antibiotics Rash  . Tape Dermatitis and Rash    Bandaids      Review of Systems:  Constitutional:  No  fever, no chills, +recent illness, No unintentional weight changes. +significant fatigue.   HEENT: No  headache, no vision change  Cardiac: No  chest pain, No  pressure, No palpitations, No  Orthopnea   Respiratory:  No  shortness of breath. No  Cough  Gastrointestinal: No  abdominal pain, No  nausea  Musculoskeletal: No new myalgia/arthralgia except L leg pain from recent fall.   Skin: No  Rash  Neurologic: No  weakness, No  dizziness, No  slurred speech/focal weakness/facial droop  Psychiatric: No  concerns with depression, No  concerns with anxiety  Exam:  BP (!) 145/75   Pulse 69   Wt 155 lb 6.4 oz (70.5 kg)   LMP  (LMP Unknown)   BMI 24.34 kg/m   Constitutional: VS see above. General Appearance: alert, well-developed, well-nourished, NAD  Eyes: Normal lids and conjunctive, non-icteric sclera  Ears, Nose, Mouth, Throat: MMM, Normal external inspection ears/nares/mouth/lips/gums.   Neck: No masses, trachea midline. No thyroid enlargement. No tenderness/mass appreciated. No lymphadenopathy  Respiratory: Normal respiratory effort. no wheeze, no rhonchi, no rales  Cardiovascular: S1/S2 normal, no murmur, no rub/gallop auscultated. RRR. No lower extremity edema.   Neurological: Normal  balance/coordination. No tremor.   Skin: warm, dry, intact.   Psychiatric: Normal judgment/insight. Normal mood and affect. Oriented x3.    Results for orders placed or performed in visit on 08/31/18 (from the past 72 hour(s))  COMPLETE METABOLIC PANEL WITH GFR     Status: Abnormal   Collection Time: 08/31/18  4:14 PM  Result Value Ref Range   Glucose, Bld 125 (H) 65 - 99 mg/dL    Comment: .            Fasting reference interval . For someone without known diabetes, a glucose value between 100 and 125 mg/dL is consistent with prediabetes and should be confirmed with a follow-up test. .    BUN 70 (H) 7 - 25 mg/dL   Creat 2.66 (H) 0.60 - 0.88 mg/dL    Comment: For patients >50 years of age, the reference limit for Creatinine is approximately 13% higher for people identified as African-American. .    GFR, Est Non African American 16 (L) > OR = 60 mL/min/1.10m2   GFR, Est African American 19 (L) > OR = 60 mL/min/1.52m2   BUN/Creatinine Ratio 26 (H) 6 - 22 (calc)   Sodium 136 135 - 146 mmol/L   Potassium 5.4 (H) 3.5 - 5.3 mmol/L   Chloride 102 98 - 110 mmol/L   CO2 25 20 - 32 mmol/L   Calcium 9.9 8.6 - 10.4 mg/dL   Total Protein 8.5 (H) 6.1 - 8.1 g/dL   Albumin 3.8 3.6 - 5.1 g/dL   Globulin 4.7 (H) 1.9 - 3.7 g/dL (calc)   AG Ratio 0.8 (L) 1.0 - 2.5 (calc)   Total Bilirubin 0.6 0.2 - 1.2 mg/dL   Alkaline phosphatase (APISO) 115 33 - 130 U/L   AST 27 10 - 35 U/L   ALT 12 6 - 29 U/L  Magnesium     Status: None   Collection Time: 08/31/18  4:14 PM  Result Value Ref Range   Magnesium 1.6 1.5 - 2.5 mg/dL      ASSESSMENT/PLAN: The primary encounter diagnosis was Hypomagnesemia. Diagnoses of Fall, subsequent encounter, Chronic combined systolic and diastolic congestive heart failure ([place]), Abnormality of plasma protein, and CKD (chronic kidney disease) stage 4, GFR 15-29 ml/min (HCC) were also pertinent to this visit.   Orders Placed This Encounter  Procedures  . COMPLETE  METABOLIC PANEL WITH GFR  . Magnesium  . Protein Electrophoresis, (serum) - PENDING    Pt advised f/u with  nephrology       Visit summary with medication list and pertinent instructions was printed for patient to review. All questions at time of visit were answered - patient instructed to contact office with any additional concerns or updates. ER/RTC precautions were reviewed with the patient.    Please note: voice recognition software was used to produce this document, and typos may escape review. Please contact Dr. Sheppard Coil for any needed clarifications.     Follow-up plan: Return in about 2 months (around 11/01/2018) for A1C recheck / diabetes follow-up, see me sooner if needed.

## 2018-09-01 ENCOUNTER — Encounter: Payer: Self-pay | Admitting: Osteopathic Medicine

## 2018-09-01 DIAGNOSIS — M25552 Pain in left hip: Secondary | ICD-10-CM | POA: Diagnosis not present

## 2018-09-01 DIAGNOSIS — G8911 Acute pain due to trauma: Secondary | ICD-10-CM | POA: Diagnosis not present

## 2018-09-01 DIAGNOSIS — E1122 Type 2 diabetes mellitus with diabetic chronic kidney disease: Secondary | ICD-10-CM | POA: Diagnosis not present

## 2018-09-01 DIAGNOSIS — M545 Low back pain: Secondary | ICD-10-CM | POA: Diagnosis not present

## 2018-09-01 DIAGNOSIS — I2510 Atherosclerotic heart disease of native coronary artery without angina pectoris: Secondary | ICD-10-CM | POA: Diagnosis not present

## 2018-09-01 DIAGNOSIS — I130 Hypertensive heart and chronic kidney disease with heart failure and stage 1 through stage 4 chronic kidney disease, or unspecified chronic kidney disease: Secondary | ICD-10-CM | POA: Diagnosis not present

## 2018-09-04 ENCOUNTER — Other Ambulatory Visit: Payer: Self-pay | Admitting: Osteopathic Medicine

## 2018-09-04 DIAGNOSIS — B0223 Postherpetic polyneuropathy: Secondary | ICD-10-CM

## 2018-09-07 DIAGNOSIS — E1122 Type 2 diabetes mellitus with diabetic chronic kidney disease: Secondary | ICD-10-CM | POA: Diagnosis not present

## 2018-09-07 DIAGNOSIS — I2510 Atherosclerotic heart disease of native coronary artery without angina pectoris: Secondary | ICD-10-CM | POA: Diagnosis not present

## 2018-09-07 DIAGNOSIS — G8911 Acute pain due to trauma: Secondary | ICD-10-CM | POA: Diagnosis not present

## 2018-09-07 DIAGNOSIS — M545 Low back pain: Secondary | ICD-10-CM | POA: Diagnosis not present

## 2018-09-07 DIAGNOSIS — I130 Hypertensive heart and chronic kidney disease with heart failure and stage 1 through stage 4 chronic kidney disease, or unspecified chronic kidney disease: Secondary | ICD-10-CM | POA: Diagnosis not present

## 2018-09-07 DIAGNOSIS — M25552 Pain in left hip: Secondary | ICD-10-CM | POA: Diagnosis not present

## 2018-09-08 DIAGNOSIS — I130 Hypertensive heart and chronic kidney disease with heart failure and stage 1 through stage 4 chronic kidney disease, or unspecified chronic kidney disease: Secondary | ICD-10-CM | POA: Diagnosis not present

## 2018-09-08 DIAGNOSIS — M25552 Pain in left hip: Secondary | ICD-10-CM | POA: Diagnosis not present

## 2018-09-08 DIAGNOSIS — G8911 Acute pain due to trauma: Secondary | ICD-10-CM | POA: Diagnosis not present

## 2018-09-08 DIAGNOSIS — M545 Low back pain: Secondary | ICD-10-CM | POA: Diagnosis not present

## 2018-09-08 DIAGNOSIS — I2510 Atherosclerotic heart disease of native coronary artery without angina pectoris: Secondary | ICD-10-CM | POA: Diagnosis not present

## 2018-09-08 DIAGNOSIS — E1122 Type 2 diabetes mellitus with diabetic chronic kidney disease: Secondary | ICD-10-CM | POA: Diagnosis not present

## 2018-09-08 LAB — COMPLETE METABOLIC PANEL WITH GFR
AG Ratio: 0.8 (calc) — ABNORMAL LOW (ref 1.0–2.5)
ALT: 12 U/L (ref 6–29)
AST: 27 U/L (ref 10–35)
Albumin: 3.8 g/dL (ref 3.6–5.1)
Alkaline phosphatase (APISO): 115 U/L (ref 33–130)
BUN/Creatinine Ratio: 26 (calc) — ABNORMAL HIGH (ref 6–22)
BUN: 70 mg/dL — ABNORMAL HIGH (ref 7–25)
CO2: 25 mmol/L (ref 20–32)
Calcium: 9.9 mg/dL (ref 8.6–10.4)
Chloride: 102 mmol/L (ref 98–110)
Creat: 2.66 mg/dL — ABNORMAL HIGH (ref 0.60–0.88)
GFR, EST NON AFRICAN AMERICAN: 16 mL/min/{1.73_m2} — AB (ref >=60)
GFR, Est African American: 19 mL/min/{1.73_m2} — ABNORMAL LOW (ref >=60)
GLOBULIN: 4.7 g/dL — AB (ref 1.9–3.7)
Glucose, Bld: 125 mg/dL — ABNORMAL HIGH (ref 65–99)
Potassium: 5.4 mmol/L — ABNORMAL HIGH (ref 3.5–5.3)
Sodium: 136 mmol/L (ref 135–146)
Total Bilirubin: 0.6 mg/dL (ref 0.2–1.2)
Total Protein: 8.5 g/dL — ABNORMAL HIGH (ref 6.1–8.1)

## 2018-09-08 LAB — TEST AUTHORIZATION 2

## 2018-09-08 LAB — PROTEIN ELECTROPHORESIS, SERUM
Albumin ELP: 3.7 g/dL — ABNORMAL LOW (ref 3.8–4.8)
Alpha 1: 0.4 g/dL — ABNORMAL HIGH (ref 0.2–0.3)
Alpha 2: 1 g/dL — ABNORMAL HIGH (ref 0.5–0.9)
Beta 2: 0.7 g/dL — ABNORMAL HIGH (ref 0.2–0.5)
Beta Globulin: 0.5 g/dL (ref 0.4–0.6)
GAMMA GLOBULIN: 2.1 g/dL — AB (ref 0.8–1.7)
Total Protein: 8.4 g/dL — ABNORMAL HIGH (ref 6.1–8.1)

## 2018-09-08 LAB — MAGNESIUM: MAGNESIUM: 1.6 mg/dL (ref 1.5–2.5)

## 2018-09-09 ENCOUNTER — Emergency Department (INDEPENDENT_AMBULATORY_CARE_PROVIDER_SITE_OTHER)
Admission: EM | Admit: 2018-09-09 | Discharge: 2018-09-09 | Disposition: A | Discharge status: Home / Self Care | Payer: Medicare Other | Source: Home / Self Care

## 2018-09-09 ENCOUNTER — Encounter: Payer: Self-pay | Admitting: Emergency Medicine

## 2018-09-09 DIAGNOSIS — N390 Urinary tract infection, site not specified: Secondary | ICD-10-CM | POA: Diagnosis not present

## 2018-09-09 LAB — POCT URINALYSIS DIP (MANUAL ENTRY)
Bilirubin, UA: NEGATIVE
Glucose, UA: NEGATIVE mg/dL
Ketones, POC UA: NEGATIVE mg/dL
Nitrite, UA: NEGATIVE
Protein Ur, POC: 100 mg/dL — AB
Spec Grav, UA: 1.01 (ref 1.010–1.025)
Urobilinogen, UA: 0.2 E.U./dL
pH, UA: 5.5 (ref 5.0–8.0)

## 2018-09-09 MED ORDER — FLUCONAZOLE 150 MG PO TABS: 150.0000 mg | ORAL_TABLET | Freq: Once | ORAL | 0 refills | Status: AC

## 2018-09-09 MED ORDER — CIPROFLOXACIN HCL 250 MG PO TABS: 250.0000 mg | ORAL_TABLET | Freq: Two times a day (BID) | ORAL | 0 refills | Status: DC

## 2018-09-09 NOTE — Discharge Instructions (Addendum)
Get your antibiotic started today.  You should be feeling better in 2 days.  Follow-up with your personal doctor if you are not improving.

## 2018-09-09 NOTE — ED Triage Notes (Signed)
Pt c/o burning, urgency, UTI symptoms.

## 2018-09-09 NOTE — ED Provider Notes (Signed)
Briana Werner CARE    CSN: [id number] Arrival date & time: 09/09/18  1328     History   Chief Complaint Chief Complaint  Patient presents with  . Dysuria    HPI Briana Werner is a 82 y.o. female.   This is the initial visit for this 82 year old woman who presents with urgency, frequency, and dysuria.  Patient has been having urinary burning since late November.  She saw her doctor then but no prescriptions for this were issued.  Instead she was referred to a urologist.  Patient has a history of only one kidney functioning with surgical shunting from the functional kidney to the other.  Patient has chronic low back pain.  She has chronic renal insufficiency.  She has been having trouble with falls and is in the process of having a work-up for that.     Past Medical History:  Diagnosis Date  . Asthma   . Atrial flutter ([place])   . CAD (coronary artery disease)   . Cerebrovascular disease   . Chronic renal insufficiency 02/26/2014  . COPD (chronic obstructive pulmonary disease) ([place]) 02/26/2014  . Hypothyroid 02/26/2014  . Presence of stent in coronary artery in patient with coronary artery disease 02/26/2014  . Renal artery stenosis ([place])   . Type 2 diabetes mellitus ([place]) 02/26/2014    Patient Active Problem List   Diagnosis Date Noted  . Globus sensation 04/13/2018  . Claudication in peripheral vascular disease ([place]) 03/17/2018  . Dysphagia 01/30/2018  . Pulmonary hypertension, primary ([place]) 01/30/2018  . CKD (chronic kidney disease) stage 4, GFR 15-29 ml/min (HCC) 02/25/2017  . Nasal polyposis 12/07/2016  . Cough 07/13/2016  . Tinea pedis 05/25/2016  . Diabetic foot ulcer ([place]) 05/25/2016  . Left hip pain 04/15/2016  . Lumbar radicular pain 01/15/2016  . Aortic atherosclerosis ([place]) 01/12/2016  . Late onset Alzheimer's disease without behavioral disturbance ([place]) 02/27/2015  . Anemia 02/16/2015  . Atrial fibrillation ([place]) 12/25/2014  . Diabetes mellitus with  kidney complication ([place]) 24/05/7352  . Impaired mobility and ADLs 07/23/2014  . Spondylisthesis 05/31/2014  . CAD (coronary artery disease) 05/01/2014  . Renal artery stenosis ([place]) 05/01/2014  . Generalized anxiety disorder 04/03/2014  . CKD (chronic kidney disease) stage 3, GFR 30-59 ml/min (HCC) 03/26/2014  . Chronic atrial flutter ([place]) 03/19/2014  . Carotid artery stenosis 03/19/2014  . Hypercalcemia 02/27/2014  . Essential hypertension, benign 02/26/2014  . Hypothyroid 02/26/2014  . Hyperlipidemia 02/26/2014  . COPD (chronic obstructive pulmonary disease) ([place]) 02/26/2014  . Dementia ([place]) 02/26/2014  . Presence of stent in coronary artery in patient with coronary artery disease 02/26/2014  . Nephrolithiasis 02/26/2014  . Diabetic peripheral neuropathy ([place]) 02/26/2014  . DVT, lower extremity, recurrent ([place]) 02/26/2014  . Status post left hip replacement 02/26/2014  . Overactive bladder 02/26/2014  . Chronic diastolic congestive heart failure, NYHA class 2 ([place]) 03/04/2012    Past Surgical History:  Procedure Laterality Date  . ABDOMINAL HYSTERECTOMY    . APPENDECTOMY    . BACK SURGERY    . CHOLECYSTECTOMY    . CORONARY ARTERY BYPASS GRAFT     2004, [place]  . Hip replacement    . KNEE SURGERY Bilateral   . NASAL SINUS SURGERY    . SHOULDER SURGERY    . TONSILLECTOMY      OB History   No obstetric history on file.      Home Medications    Prior to Admission medications   Medication Sig Start Date  End Date Taking? Authorizing Provider  ADVAIR DISKUS 500-50 MCG/DOSE AEPB INHALE 1 PUFF INTO THE LUNGS TWICE DAILY 05/22/18   Emeterio Reeve, DO  albuterol (PROVENTIL HFA;VENTOLIN HFA) 108 (90 Base) MCG/ACT inhaler Inhale 2 puffs into the lungs every 6 (six) hours as needed for wheezing. 07/24/18   Emeterio Reeve, DO  apixaban (ELIQUIS) 2.5 MG TABS tablet Take 1 tablet (2.5 mg total) by mouth 2 (two) times daily. 07/12/18   Lelon Perla, MD  Calcium  Carbonate-Vitamin D (CALTRATE 600+D PO) Take by mouth 2 (two) times daily.    [provider]  carvedilol (COREG) 12.5 MG tablet TAKE 1 TABLET(12.5 MG) BY MOUTH TWICE DAILY WITH A MEAL 08/02/18   Emeterio Reeve, DO  cetirizine (ZYRTEC) 10 MG tablet Take 1 tablet (10 mg total) by mouth at bedtime. 05/05/18   Emeterio Reeve, DO  ciprofloxacin (CIPRO) 250 MG tablet Take 1 tablet (250 mg total) by mouth every 12 (twelve) hours. 09/09/18   Robyn Haber, MD  diclofenac sodium (VOLTAREN) 1 % GEL Apply 4 g topically 4 (four) times daily. To affected joint. 10/24/17   Gregor Hams, MD  escitalopram (LEXAPRO) 5 MG tablet TAKE 1 TABLET(5 MG) BY MOUTH DAILY 08/22/18   Emeterio Reeve, DO  famotidine (PEPCID) 40 MG tablet Take 1 tablet (40 mg total) by mouth daily. 08/04/18   Emeterio Reeve, DO  FeFum-FePoly-FA-B Cmp-C-Biot (INTEGRA PLUS) CAPS TAKE 1 CAPSULE BY MOUTH DAILY 06/28/17   Emeterio Reeve, DO  fluconazole (DIFLUCAN) 150 MG tablet Take 1 tablet (150 mg total) by mouth once for 1 dose. Repeat if needed 09/09/18 09/09/18  Robyn Haber, MD  gabapentin (NEURONTIN) 300 MG capsule TAKE 1 CAPSULE(300 MG) BY MOUTH THREE TIMES DAILY 09/04/18   Emeterio Reeve, DO  glucose blood (ONE TOUCH ULTRA TEST) test strip Check blood sugar up to 4 times daily. Dx DM ICD 10 E11.9 insulin dependent. 07/20/17   Emeterio Reeve, DO  Insulin Glargine (TOUJEO SOLOSTAR) 300 UNIT/ML SOPN Inject 10 Units into the skin daily. 03/29/18   Emeterio Reeve, DO  Insulin Pen Needle (BD PEN NEEDLE NANO U/F) 32G X 4 MM MISC Inject into skin once daily. Use with insulin pen. DX DM ICD-10 E11.22 05/03/17   Emeterio Reeve, DO  ipratropium-albuterol (DUONEB) 0.5-2.5 (3) MG/3ML SOLN Take 3 mLs by nebulization every 2 (two) hours as needed (wheeze, SOB, cough). 01/28/17   Emeterio Reeve, DO  IRON PO Take by mouth.    [provider]  JANUVIA 50 MG tablet TAKE 1 TABLET BY MOUTH EVERY DAY  08/22/18   Emeterio Reeve, DO  levothyroxine (SYNTHROID, LEVOTHROID) 88 MCG tablet TAKE 1 TABLET BY MOUTH DAILY BEFORE BREAKFAST 03/23/18   Emeterio Reeve, DO  losartan (COZAAR) 100 MG tablet Take 1 tablet (100 mg total) by mouth daily. 07/12/18 10/10/18  Lelon Perla, MD  montelukast (SINGULAIR) 10 MG tablet TAKE 1 TABLET BY MOUTH AT BEDTIME 07/18/18   Emeterio Reeve, DO  Multiple Vitamin (MULTIVITAMIN) capsule Take 1 capsule by mouth.    [provider]  omeprazole (PRILOSEC) 20 MG capsule TAKE 1 CAPSULE BY MOUTH TWICE DAILY 08/22/18   Emeterio Reeve, DO  omeprazole (PRILOSEC) 40 MG capsule Take 1 capsule (40 mg total) by mouth daily. 08/04/18   Emeterio Reeve, DO    Family History Family History  Problem Relation Age of Onset  . CAD Father   . Heart attack Father     Social History Social History   Tobacco Use  . Smoking  status: Never Smoker  . Smokeless tobacco: Never Used  Substance Use Topics  . Alcohol use: No  . Drug use: No     Allergies   Tetracycline; Tetracyclines & related; Amlodipine; Amoxicillin; Latex; Other; Sulfamethoxazole-trimethoprim; Sulfa antibiotics; and Tape   Review of Systems Review of Systems   Physical Exam Triage Vital Signs ED Triage Vitals [09/09/18 1359]  Enc Vitals Group     BP 111/62     Pulse Rate 69     Resp 16     Temp 97.7 F (36.5 C)     Temp src      SpO2 100 %     Weight      Height      Head Circumference      Peak Flow      Pain Score 6     Pain Loc      Pain Edu?      Excl. in [place]?    No data found.  Updated Vital Signs BP 111/62   Pulse 69   Temp 97.7 F (36.5 C)   Resp 16   LMP  (LMP Unknown)   SpO2 100%    Physical Exam Vitals signs and nursing note reviewed.  Constitutional:      Appearance: Normal appearance.  HENT:     Head: Normocephalic.     Mouth/Throat:     Mouth: Mucous membranes are moist.  Eyes:     Conjunctiva/sclera: Conjunctivae normal.  Neck:      Musculoskeletal: Normal range of motion and neck supple.  Cardiovascular:     Rate and Rhythm: Normal rate.  Pulmonary:     Effort: Pulmonary effort is normal.     Breath sounds: Normal breath sounds.  Abdominal:     Comments: No flank tenderness or abdominal tenderness  Musculoskeletal: Normal range of motion.  Skin:    General: Skin is warm and dry.  Neurological:     General: No focal deficit present.     Mental Status: She is alert.      UC Treatments / Results  Labs (all labs ordered are listed, but only abnormal results are displayed) Labs Reviewed  POCT URINALYSIS DIP (MANUAL ENTRY) - Abnormal; Notable for the following components:      Result Value   Color, UA green (*)    Blood, UA moderate (*)    Protein Ur, POC =100 (*)    Leukocytes, UA Small (1+) (*)    All other components within normal limits  URINE CULTURE    EKG None  Radiology No results found.  Procedures Procedures (including critical care time)  Medications Ordered in UC Medications - No data to display  Initial Impression / Assessment and Plan / UC Course  I have reviewed the triage vital signs and the nursing notes.  Pertinent labs & imaging results that were available during my care of the patient were reviewed by me and considered in my medical decision making (see chart for details).    Final Clinical Impressions(s) / UC Diagnoses   Final diagnoses:  Lower urinary tract infectious disease     Discharge Instructions     Get your antibiotic started today.  You should be feeling better in 2 days.  Follow-up with your personal doctor if you are not improving.    ED Prescriptions    Medication Sig Dispense Auth. Provider   ciprofloxacin (CIPRO) 250 MG tablet Take 1 tablet (250 mg total) by mouth every 12 (twelve) hours. 10  tablet Robyn Haber, MD   fluconazole (DIFLUCAN) 150 MG tablet Take 1 tablet (150 mg total) by mouth once for 1 dose. Repeat if needed 2 tablet  Robyn Haber, MD     Controlled Substance Prescriptions [place] Controlled Substance Registry consulted? Not Applicable   Robyn Haber, MD 09/09/18 1423

## 2018-09-11 DIAGNOSIS — I130 Hypertensive heart and chronic kidney disease with heart failure and stage 1 through stage 4 chronic kidney disease, or unspecified chronic kidney disease: Secondary | ICD-10-CM | POA: Diagnosis not present

## 2018-09-11 DIAGNOSIS — G8911 Acute pain due to trauma: Secondary | ICD-10-CM | POA: Diagnosis not present

## 2018-09-11 DIAGNOSIS — I2510 Atherosclerotic heart disease of native coronary artery without angina pectoris: Secondary | ICD-10-CM | POA: Diagnosis not present

## 2018-09-11 DIAGNOSIS — E1122 Type 2 diabetes mellitus with diabetic chronic kidney disease: Secondary | ICD-10-CM | POA: Diagnosis not present

## 2018-09-11 DIAGNOSIS — M25552 Pain in left hip: Secondary | ICD-10-CM | POA: Diagnosis not present

## 2018-09-11 DIAGNOSIS — M545 Low back pain: Secondary | ICD-10-CM | POA: Diagnosis not present

## 2018-09-12 ENCOUNTER — Telehealth: Payer: Self-pay | Admitting: *Deleted

## 2018-09-12 LAB — URINE CULTURE
MICRO NUMBER:: 91549109
SPECIMEN QUALITY:: ADEQUATE

## 2018-09-12 NOTE — Telephone Encounter (Signed)
Spoke to pt given Ucx results, complete ABT and to call back if she has any questions or concerns.

## 2018-09-15 DIAGNOSIS — M25552 Pain in left hip: Secondary | ICD-10-CM | POA: Diagnosis not present

## 2018-09-15 DIAGNOSIS — I2510 Atherosclerotic heart disease of native coronary artery without angina pectoris: Secondary | ICD-10-CM | POA: Diagnosis not present

## 2018-09-15 DIAGNOSIS — I130 Hypertensive heart and chronic kidney disease with heart failure and stage 1 through stage 4 chronic kidney disease, or unspecified chronic kidney disease: Secondary | ICD-10-CM | POA: Diagnosis not present

## 2018-09-15 DIAGNOSIS — E1122 Type 2 diabetes mellitus with diabetic chronic kidney disease: Secondary | ICD-10-CM | POA: Diagnosis not present

## 2018-09-15 DIAGNOSIS — M545 Low back pain: Secondary | ICD-10-CM | POA: Diagnosis not present

## 2018-09-15 DIAGNOSIS — G8911 Acute pain due to trauma: Secondary | ICD-10-CM | POA: Diagnosis not present

## 2018-09-18 DIAGNOSIS — I2510 Atherosclerotic heart disease of native coronary artery without angina pectoris: Secondary | ICD-10-CM | POA: Diagnosis not present

## 2018-09-18 DIAGNOSIS — E1122 Type 2 diabetes mellitus with diabetic chronic kidney disease: Secondary | ICD-10-CM | POA: Diagnosis not present

## 2018-09-18 DIAGNOSIS — I130 Hypertensive heart and chronic kidney disease with heart failure and stage 1 through stage 4 chronic kidney disease, or unspecified chronic kidney disease: Secondary | ICD-10-CM | POA: Diagnosis not present

## 2018-09-18 DIAGNOSIS — G8911 Acute pain due to trauma: Secondary | ICD-10-CM | POA: Diagnosis not present

## 2018-09-18 DIAGNOSIS — M545 Low back pain: Secondary | ICD-10-CM | POA: Diagnosis not present

## 2018-09-18 DIAGNOSIS — M25552 Pain in left hip: Secondary | ICD-10-CM | POA: Diagnosis not present

## 2018-09-20 ENCOUNTER — Other Ambulatory Visit: Payer: Self-pay | Admitting: Osteopathic Medicine

## 2018-09-20 DIAGNOSIS — G8911 Acute pain due to trauma: Secondary | ICD-10-CM | POA: Diagnosis not present

## 2018-09-20 DIAGNOSIS — I2510 Atherosclerotic heart disease of native coronary artery without angina pectoris: Secondary | ICD-10-CM | POA: Diagnosis not present

## 2018-09-20 DIAGNOSIS — M545 Low back pain: Secondary | ICD-10-CM | POA: Diagnosis not present

## 2018-09-20 DIAGNOSIS — E1122 Type 2 diabetes mellitus with diabetic chronic kidney disease: Secondary | ICD-10-CM | POA: Diagnosis not present

## 2018-09-20 DIAGNOSIS — M25552 Pain in left hip: Secondary | ICD-10-CM | POA: Diagnosis not present

## 2018-09-20 DIAGNOSIS — I130 Hypertensive heart and chronic kidney disease with heart failure and stage 1 through stage 4 chronic kidney disease, or unspecified chronic kidney disease: Secondary | ICD-10-CM | POA: Diagnosis not present

## 2018-09-22 ENCOUNTER — Other Ambulatory Visit: Payer: Self-pay | Admitting: Osteopathic Medicine

## 2018-09-22 ENCOUNTER — Telehealth: Payer: Self-pay

## 2018-09-22 NOTE — Telephone Encounter (Signed)
Physical therapist Heide Spark from [facility] called requesting an extension for PT for patient. As per physical therapist, pt missed some therapy sessions. Requesting to extend physical therapy for 2x times a wk, for 2 wks. Verbal order was given for extension.

## 2018-09-25 DIAGNOSIS — I130 Hypertensive heart and chronic kidney disease with heart failure and stage 1 through stage 4 chronic kidney disease, or unspecified chronic kidney disease: Secondary | ICD-10-CM | POA: Diagnosis not present

## 2018-09-25 DIAGNOSIS — M545 Low back pain: Secondary | ICD-10-CM | POA: Diagnosis not present

## 2018-09-25 DIAGNOSIS — I2510 Atherosclerotic heart disease of native coronary artery without angina pectoris: Secondary | ICD-10-CM | POA: Diagnosis not present

## 2018-09-25 DIAGNOSIS — G8911 Acute pain due to trauma: Secondary | ICD-10-CM | POA: Diagnosis not present

## 2018-09-25 DIAGNOSIS — M25552 Pain in left hip: Secondary | ICD-10-CM | POA: Diagnosis not present

## 2018-09-25 DIAGNOSIS — E1122 Type 2 diabetes mellitus with diabetic chronic kidney disease: Secondary | ICD-10-CM | POA: Diagnosis not present

## 2018-09-26 ENCOUNTER — Encounter: Payer: Self-pay | Admitting: Pulmonary Disease

## 2018-09-26 ENCOUNTER — Ambulatory Visit (INDEPENDENT_AMBULATORY_CARE_PROVIDER_SITE_OTHER): Payer: Medicare Other | Admitting: Pulmonary Disease

## 2018-09-26 VITALS — BP 138/56 | HR 64 | Ht 67.0 in | Wt 155.0 lb

## 2018-09-26 DIAGNOSIS — J45909 Unspecified asthma, uncomplicated: Secondary | ICD-10-CM | POA: Diagnosis not present

## 2018-09-26 DIAGNOSIS — R0602 Shortness of breath: Secondary | ICD-10-CM | POA: Diagnosis not present

## 2018-09-26 DIAGNOSIS — T17908S Unspecified foreign body in respiratory tract, part unspecified causing other injury, sequela: Secondary | ICD-10-CM

## 2018-09-26 NOTE — Progress Notes (Signed)
Synopsis: Referred in Jan 2020 for cough  Subjective:   PATIENT ID: Briana Werner GENDER: female DOB: 1936/09/04, MRN: [id number]   HPI  Chief Complaint  Patient presents with  . Consult    referred by Dr. Sheppard Coil for cough, copd.     [name] is here to see me for cough that has been bothering her for a long time and "her doctors can't get rid of it". > she has been through swallowing evaluation > she has been seen by ENT several times > ENT has told her that she may need speech therapy > she says that she has ben through speech therapy 5-6 times over the last few years and she was told that she didn't need it anymore > she says that this time around she has had cough for 6 months > it is a dry, hacking cough that takes away her breath > she "cannot swallow" when she has the cough > it happens "all the time" and tends to "come and go on its own" > she says that it is a very deep cough that shuts her breath ouff > it happens when she is eating > she can't swallow or speak when it happens and "it's a devastating cough" > she doesn't know the reason for it and she doesn't know what to do for it.  > she has tried various inhaled medicines, a "breathing machine helped somewhat".   > she doesn't feel that there eis one medicine that makes it better > she takes a lot of medicine, and feels that it is hard to pick a new medicine > when it is happening she will have trouble breathing  > when others pat her on her back while coughing this often makes the cough worse > she says she lives in a group living arrangement  She has never been told that she had a lung problem.  She never smoked.  Doesn'st have dyspnea while walking.    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    She has a runny nose, sinus congestion and post nasal drip.  Doesn't sneeze.  Doesn't have itchy eyes or a scratchy throat.    She denies heartburn or indigestion.  The cough is worse  while eating, not necessarily after eating.    Past Medical History:  Diagnosis Date  . Asthma   . Atrial flutter ([place])   . CAD (coronary artery disease)   . Cerebrovascular disease   . Chronic renal insufficiency 02/26/2014  . COPD (chronic obstructive pulmonary disease) ([place]) 02/26/2014  . Hypothyroid 02/26/2014  . Presence of stent in coronary artery in patient with coronary artery disease 02/26/2014  . Renal artery stenosis ([place])   . Type 2 diabetes mellitus ([place]) 02/26/2014     Family History  Problem Relation Age of Onset  . CAD Father   . Heart attack Father      Social History   Socioeconomic History  . Marital status: Married    Spouse name: Not on file  . Number of children: 4  . Years of education: Not on file  . Highest education level: Not on file  Occupational History  . Not on file  Social Needs  . Financial resource strain: Not on file  . Food insecurity:    Worry: Not on file    Inability: Not on file  . Transportation needs:    Medical: Not on file    Non-medical: Not on  file  Tobacco Use  . Smoking status: Never Smoker  . Smokeless tobacco: Never Used  Substance and Sexual Activity  . Alcohol use: No  . Drug use: No  . Sexual activity: Never  Lifestyle  . Physical activity:    Days per week: Not on file    Minutes per session: Not on file  . Stress: Not on file  Relationships  . Social connections:    Talks on phone: Not on file    Gets together: Not on file    Attends religious service: Not on file    Active member of club or organization: Not on file    Attends meetings of clubs or organizations: Not on file    Relationship status: Not on file  . Intimate partner violence:    Fear of current or ex partner: Not on file    Emotionally abused: Not on file    Physically abused: Not on file    Forced sexual activity: Not on file  Other Topics Concern  . Not on file  Social History Narrative  . Not on file     Allergies  Allergen  Reactions  . Tetracycline Shortness Of Breath  . Tetracyclines & Related Anaphylaxis and Shortness Of Breath    dizziness  . Amlodipine Other (See Comments)    Edema   . Amoxicillin Other (See Comments)    Questionable rash   . Latex Dermatitis    Bandages, pulls skin  . Other Dermatitis    Bandaids    . Sulfamethoxazole-Trimethoprim Other (See Comments)    dizzy  dizzy  . Sulfa Antibiotics Rash  . Tape Dermatitis and Rash    Bandaids     Outpatient Medications Prior to Visit  Medication Sig Dispense Refill  . ADVAIR DISKUS 500-50 MCG/DOSE AEPB INHALE 1 PUFF INTO THE LUNGS TWICE DAILY 60 each 2  . albuterol (PROVENTIL HFA;VENTOLIN HFA) 108 (90 Base) MCG/ACT inhaler Inhale 2 puffs into the lungs every 6 (six) hours as needed for wheezing. 2 Inhaler 11  . apixaban (ELIQUIS) 2.5 MG TABS tablet Take 1 tablet (2.5 mg total) by mouth 2 (two) times daily. 180 tablet 3  . Calcium Carbonate-Vitamin D (CALTRATE 600+D PO) Take by mouth 2 (two) times daily.    . carvedilol (COREG) 12.5 MG tablet TAKE 1 TABLET(12.5 MG) BY MOUTH TWICE DAILY WITH A MEAL 180 tablet 1  . cetirizine (ZYRTEC) 10 MG tablet Take 1 tablet (10 mg total) by mouth at bedtime. 90 tablet 3  . diclofenac sodium (VOLTAREN) 1 % GEL Apply 4 g topically 4 (four) times daily. To affected joint. 100 g 11  . escitalopram (LEXAPRO) 5 MG tablet TAKE 1 TABLET(5 MG) BY MOUTH DAILY 30 tablet 0  . famotidine (PEPCID) 40 MG tablet Take 1 tablet (40 mg total) by mouth daily. 90 tablet 1  . FeFum-FePoly-FA-B Cmp-C-Biot (INTEGRA PLUS) CAPS TAKE 1 CAPSULE BY MOUTH DAILY 90 capsule 0  . gabapentin (NEURONTIN) 300 MG capsule TAKE 1 CAPSULE(300 MG) BY MOUTH THREE TIMES DAILY 90 capsule 0  . glucose blood (ONE TOUCH ULTRA TEST) test strip Check blood sugar up to 4 times daily. Dx DM ICD 10 E11.9 insulin dependent. 200 each prn  . Insulin Glargine (TOUJEO SOLOSTAR) 300 UNIT/ML SOPN Inject 10 Units into the skin daily. 15 mL 5  . Insulin Pen  Needle (BD PEN NEEDLE NANO U/F) 32G X 4 MM MISC Inject into skin once daily. Use with insulin pen. DX DM ICD-10 E11.22 100  each prn  . ipratropium-albuterol (DUONEB) 0.5-2.5 (3) MG/3ML SOLN Take 3 mLs by nebulization every 2 (two) hours as needed (wheeze, SOB, cough). 60 mL 1  . IRON PO Take by mouth.    Marland Kitchen JANUVIA 50 MG tablet TAKE 1 TABLET BY MOUTH EVERY DAY 30 tablet 0  . levothyroxine (SYNTHROID, LEVOTHROID) 88 MCG tablet TAKE 1 TABLET BY MOUTH DAILY BEFORE BREAKFAST 90 tablet 1  . losartan (COZAAR) 100 MG tablet Take 1 tablet (100 mg total) by mouth daily. 90 tablet 3  . montelukast (SINGULAIR) 10 MG tablet TAKE 1 TABLET BY MOUTH AT BEDTIME 90 tablet 0  . Multiple Vitamin (MULTIVITAMIN) capsule Take 1 capsule by mouth.    Marland Kitchen omeprazole (PRILOSEC) 20 MG capsule TAKE 1 CAPSULE BY MOUTH TWICE DAILY 180 capsule 0  . omeprazole (PRILOSEC) 40 MG capsule Take 1 capsule (40 mg total) by mouth daily. 90 capsule 1  . ciprofloxacin (CIPRO) 250 MG tablet Take 1 tablet (250 mg total) by mouth every 12 (twelve) hours. (Patient not taking: Reported on 09/26/2018) 10 tablet 0   No facility-administered medications prior to visit.     Review of Systems  Constitutional: Negative for fever and weight loss.  HENT: Positive for sore throat. Negative for congestion, ear pain and nosebleeds.   Eyes: Negative for redness.  Respiratory: Positive for cough and shortness of breath. Negative for wheezing.   Cardiovascular: Negative for palpitations, leg swelling and PND.  Gastrointestinal: Negative for nausea and vomiting.  Genitourinary: Negative for dysuria.  Skin: Negative for rash.  Neurological: Negative for headaches.  Endo/Heme/Allergies: Does not bruise/bleed easily.  Psychiatric/Behavioral: Negative for depression. The patient is not nervous/anxious.       Objective:  Physical Exam   Vitals:   09/26/18 1522  BP: (!) 138/56  Pulse: 64  SpO2: 95%  Weight: 155 lb (70.3 kg)  Height: 5\' 7"  (1.702  m)   RA  Gen: chronically ill appearing, no acute distress HENT: NCAT, OP clear, neck supple without masses Eyes: PERRL, EOMi Lymph: no cervical lymphadenopathy PULM: Coarse crackles bases B CV: RRR, no mgr, no JVD GI: BS+, soft, nontender, no hsm Derm: no rash or skin breakdown MSK: normal bulk and tone Neuro: A&Ox4, CN II-XII intact, strength 5/5 in all 4 extremities Psyche: normal mood and affect   CBC    Component Value Date/Time   WBC 9.8 08/04/2018 1132   RBC 4.29 08/04/2018 1132   HGB 12.7 08/04/2018 1132   HCT 38.3 08/04/2018 1132   PLT 229 08/04/2018 1132   MCV 89.3 08/04/2018 1132   MCH 29.6 08/04/2018 1132   MCHC 33.2 08/04/2018 1132   RDW 13.5 08/04/2018 1132   LYMPHSABS 1,891 08/04/2018 1132   MONOABS 1,309 (H) 01/28/2017 1136   EOSABS 2,773 (H) 08/04/2018 1132   BASOSABS 59 08/04/2018 1132     Chest imaging: January 2020 chest x-ray images independently reviewed showing increased interstitial markings bilaterally  Other imaging: 2017 modified barium swallow: No evidence of aspiration of any consistency  PFT:  Labs:  Path:  Echo:  Heart Catheterization:       Assessment & Plan:   No diagnosis found.  Discussion: 83 year old female with chronic cough comes to my clinic today for evaluation of the same.  Objectively she has crackles on exam and a chest x-ray from 1 year ago which shows increased interstitial markings.  Given her history of choking on food and the fact that the cough gets worse with eating makes me highly  suspicious that she has recurrent aspiration pneumonitis leading to pulmonary fibrosis.  This and it of itself will cause more cough.  We need to have a high-resolution CT scan of the chest to prove this.  She also needs to have lung function testing.  That being said, it is unclear to me how to reconcile the extremely elevated eosinophil count.  I will reach out to her primary care physician to see if there has been work-up  for this or if there are any clues as to why she may have this.  Plan: Cough: As described today in clinic I am concerned that this may be due to chronic aspiration: Continue following the recommendations from the speech therapist We will check a lung function test We will check a high-resolution CT scan of your chest  Possible asthma with elevated serum eosinophil count: Rollene Fare can check a blood test today to look for evidence of underlying allergy Keep taking Advair for now  We will see you back in 2 to 4 weeks to go over these results or sooner if needed    Current Outpatient Medications:  .  ADVAIR DISKUS 500-50 MCG/DOSE AEPB, INHALE 1 PUFF INTO THE LUNGS TWICE DAILY, Disp: 60 each, Rfl: 2 .  albuterol (PROVENTIL HFA;VENTOLIN HFA) 108 (90 Base) MCG/ACT inhaler, Inhale 2 puffs into the lungs every 6 (six) hours as needed for wheezing., Disp: 2 Inhaler, Rfl: 11 .  apixaban (ELIQUIS) 2.5 MG TABS tablet, Take 1 tablet (2.5 mg total) by mouth 2 (two) times daily., Disp: 180 tablet, Rfl: 3 .  Calcium Carbonate-Vitamin D (CALTRATE 600+D PO), Take by mouth 2 (two) times daily., Disp: , Rfl:  .  carvedilol (COREG) 12.5 MG tablet, TAKE 1 TABLET(12.5 MG) BY MOUTH TWICE DAILY WITH A MEAL, Disp: 180 tablet, Rfl: 1 .  cetirizine (ZYRTEC) 10 MG tablet, Take 1 tablet (10 mg total) by mouth at bedtime., Disp: 90 tablet, Rfl: 3 .  diclofenac sodium (VOLTAREN) 1 % GEL, Apply 4 g topically 4 (four) times daily. To affected joint., Disp: 100 g, Rfl: 11 .  escitalopram (LEXAPRO) 5 MG tablet, TAKE 1 TABLET(5 MG) BY MOUTH DAILY, Disp: 30 tablet, Rfl: 0 .  famotidine (PEPCID) 40 MG tablet, Take 1 tablet (40 mg total) by mouth daily., Disp: 90 tablet, Rfl: 1 .  FeFum-FePoly-FA-B Cmp-C-Biot (INTEGRA PLUS) CAPS, TAKE 1 CAPSULE BY MOUTH DAILY, Disp: 90 capsule, Rfl: 0 .  gabapentin (NEURONTIN) 300 MG capsule, TAKE 1 CAPSULE(300 MG) BY MOUTH THREE TIMES DAILY, Disp: 90 capsule, Rfl: 0 .  glucose blood (ONE TOUCH  ULTRA TEST) test strip, Check blood sugar up to 4 times daily. Dx DM ICD 10 E11.9 insulin dependent., Disp: 200 each, Rfl: prn .  Insulin Glargine (TOUJEO SOLOSTAR) 300 UNIT/ML SOPN, Inject 10 Units into the skin daily., Disp: 15 mL, Rfl: 5 .  Insulin Pen Needle (BD PEN NEEDLE NANO U/F) 32G X 4 MM MISC, Inject into skin once daily. Use with insulin pen. DX DM ICD-10 E11.22, Disp: 100 each, Rfl: prn .  ipratropium-albuterol (DUONEB) 0.5-2.5 (3) MG/3ML SOLN, Take 3 mLs by nebulization every 2 (two) hours as needed (wheeze, SOB, cough)., Disp: 60 mL, Rfl: 1 .  IRON PO, Take by mouth., Disp: , Rfl:  .  JANUVIA 50 MG tablet, TAKE 1 TABLET BY MOUTH EVERY DAY, Disp: 30 tablet, Rfl: 0 .  levothyroxine (SYNTHROID, LEVOTHROID) 88 MCG tablet, TAKE 1 TABLET BY MOUTH DAILY BEFORE BREAKFAST, Disp: 90 tablet, Rfl: 1 .  losartan (  COZAAR) 100 MG tablet, Take 1 tablet (100 mg total) by mouth daily., Disp: 90 tablet, Rfl: 3 .  montelukast (SINGULAIR) 10 MG tablet, TAKE 1 TABLET BY MOUTH AT BEDTIME, Disp: 90 tablet, Rfl: 0 .  Multiple Vitamin (MULTIVITAMIN) capsule, Take 1 capsule by mouth., Disp: , Rfl:  .  omeprazole (PRILOSEC) 20 MG capsule, TAKE 1 CAPSULE BY MOUTH TWICE DAILY, Disp: 180 capsule, Rfl: 0

## 2018-09-26 NOTE — Patient Instructions (Signed)
Cough: As described today in clinic I am concerned that this may be due to chronic aspiration: Continue following the recommendations from the speech therapist We will check a lung function test We will check a high-resolution CT scan of your chest  Possible asthma with elevated serum eosinophil count: Briana Werner can check a blood test today to look for evidence of underlying allergy Keep taking Advair for now  We will see you back in 2 to 4 weeks to go over these results or sooner if needed

## 2018-09-27 ENCOUNTER — Telehealth: Payer: Self-pay | Admitting: Pulmonary Disease

## 2018-09-27 DIAGNOSIS — G8911 Acute pain due to trauma: Secondary | ICD-10-CM | POA: Diagnosis not present

## 2018-09-27 DIAGNOSIS — I2510 Atherosclerotic heart disease of native coronary artery without angina pectoris: Secondary | ICD-10-CM | POA: Diagnosis not present

## 2018-09-27 DIAGNOSIS — M25552 Pain in left hip: Secondary | ICD-10-CM | POA: Diagnosis not present

## 2018-09-27 DIAGNOSIS — I130 Hypertensive heart and chronic kidney disease with heart failure and stage 1 through stage 4 chronic kidney disease, or unspecified chronic kidney disease: Secondary | ICD-10-CM | POA: Diagnosis not present

## 2018-09-27 DIAGNOSIS — E1122 Type 2 diabetes mellitus with diabetic chronic kidney disease: Secondary | ICD-10-CM | POA: Diagnosis not present

## 2018-09-27 DIAGNOSIS — M545 Low back pain: Secondary | ICD-10-CM | POA: Diagnosis not present

## 2018-09-27 NOTE — Telephone Encounter (Signed)
Called and spoke with patient, she stated that she was supposed to have her PFT done by today but she was scheduled for the 20th. Advised patient of AVS instructions from BQ OV yesterday. Copied below.   Instructions   Cough: As described today in clinic I am concerned that this may be due to chronic aspiration: Continue following the recommendations from the speech therapist We will check a lung function test We will check a high-resolution CT scan of your chest  Possible asthma with elevated serum eosinophil count: Rollene Fare can check a blood test today to look for evidence of underlying allergy Keep taking Advair for now  We will see you back in 2 to 4 weeks to go over these results or sooner if needed     _____________________________________________________________________________________________  Advised patient that even though her appointment was in [place] the test is at our office in [place]. Gave patient the office address and made sure she spoke it back to me. Patient stated that she will be able to to make it to this. Nothing further needed.

## 2018-09-29 LAB — IGE+ALLERGENS ZONE 2(30)
AMER SYCAMORE IGE QN: 0.27 kU/L — AB
Alternaria Alternata IgE: <0.1 kU/L
Aspergillus Fumigatus IgE: <0.1 kU/L
Bahia Grass IgE: 0.15 kU/L — AB
Bermuda Grass IgE: 0.1 kU/L — AB
Cat Dander IgE: <0.1 kU/L
Cedar, Mountain IgE: 0.41 kU/L — AB
Cladosporium Herbarum IgE: <0.1 kU/L
Common Silver Birch IgE: 0.32 kU/L — AB
D Farinae IgE: <0.1 kU/L
D Pteronyssinus IgE: <0.1 kU/L
Dog Dander IgE: <0.1 kU/L
Elm, American IgE: 0.35 kU/L — AB
Hickory, White IgE: 0.1 kU/L — AB
IgE (Immunoglobulin E), Serum: 132 IU/mL (ref 6–495)
Johnson Grass IgE: <0.1 kU/L
Maple/Box Elder IgE: <0.1 kU/L
Mucor Racemosus IgE: <0.1 kU/L
Mugwort IgE Qn: <0.1 kU/L
Nettle IgE: 0.39 kU/L — AB
Oak, White IgE: 0.13 kU/L — AB
Pigweed, Rough IgE: <0.1 kU/L
Plantain, English IgE: 0.13 kU/L — AB
Ragweed, Short IgE: 0.16 kU/L — AB
Stemphylium Herbarum IgE: <0.1 kU/L
Sweet gum IgE RAST Ql: 0.46 kU/L — AB
Timothy Grass IgE: <0.1 kU/L
White Mulberry IgE: <0.1 kU/L

## 2018-10-02 ENCOUNTER — Ambulatory Visit (INDEPENDENT_AMBULATORY_CARE_PROVIDER_SITE_OTHER): Payer: Medicare Other | Admitting: Pulmonary Disease

## 2018-10-02 DIAGNOSIS — N183 Chronic kidney disease, stage 3 (moderate): Secondary | ICD-10-CM | POA: Diagnosis not present

## 2018-10-02 DIAGNOSIS — R0602 Shortness of breath: Secondary | ICD-10-CM | POA: Diagnosis not present

## 2018-10-02 LAB — PULMONARY FUNCTION TEST
DL/VA % pred: 65 %
DL/VA: 3.21 ml/min/mmHg/L
DLCO unc % pred: 45 %
DLCO unc: 11.73 ml/min/mmHg
FEF 25-75 Post: 0.91 L/sec
FEF 25-75 Pre: 0.51 L/sec
FEF2575-%Change-Post: 80 %
FEF2575-%Pred-Post: 62 %
FEF2575-%Pred-Pre: 34 %
FEV1-%CHANGE-POST: 17 %
FEV1-%PRED-PRE: 42 %
FEV1-%Pred-Post: 49 %
FEV1-Post: 1.01 L
FEV1-Pre: 0.86 L
FEV1FVC-%Change-Post: -1 %
FEV1FVC-%Pred-Pre: 93 %
FEV6-%Change-Post: 19 %
FEV6-%Pred-Post: 57 %
FEV6-%Pred-Pre: 48 %
FEV6-Post: 1.49 L
FEV6-Pre: 1.25 L
FEV6FVC-%Pred-Post: 105 %
FEV6FVC-%Pred-Pre: 105 %
FVC-%Change-Post: 19 %
FVC-%PRED-PRE: 45 %
FVC-%Pred-Post: 54 %
FVC-POST: 1.49 L
FVC-Pre: 1.25 L
PRE FEV6/FVC RATIO: 100 %
Post FEV1/FVC ratio: 68 %
Post FEV6/FVC ratio: 100 %
Pre FEV1/FVC ratio: 69 %
RV % pred: 101 %
RV: 2.48 L
TLC % pred: 78 %
TLC: 4.11 L

## 2018-10-02 NOTE — Progress Notes (Signed)
PFT done today. 

## 2018-10-03 ENCOUNTER — Other Ambulatory Visit: Payer: Self-pay | Admitting: Osteopathic Medicine

## 2018-10-03 ENCOUNTER — Ambulatory Visit (INDEPENDENT_AMBULATORY_CARE_PROVIDER_SITE_OTHER): Payer: Medicare Other

## 2018-10-03 DIAGNOSIS — R0602 Shortness of breath: Secondary | ICD-10-CM

## 2018-10-03 DIAGNOSIS — I700 Atherosclerosis of aorta: Secondary | ICD-10-CM | POA: Diagnosis not present

## 2018-10-03 DIAGNOSIS — J811 Chronic pulmonary edema: Secondary | ICD-10-CM | POA: Diagnosis not present

## 2018-10-04 DIAGNOSIS — M545 Low back pain: Secondary | ICD-10-CM | POA: Diagnosis not present

## 2018-10-04 DIAGNOSIS — E1122 Type 2 diabetes mellitus with diabetic chronic kidney disease: Secondary | ICD-10-CM | POA: Diagnosis not present

## 2018-10-04 DIAGNOSIS — G8911 Acute pain due to trauma: Secondary | ICD-10-CM | POA: Diagnosis not present

## 2018-10-04 DIAGNOSIS — I2510 Atherosclerotic heart disease of native coronary artery without angina pectoris: Secondary | ICD-10-CM | POA: Diagnosis not present

## 2018-10-04 DIAGNOSIS — I130 Hypertensive heart and chronic kidney disease with heart failure and stage 1 through stage 4 chronic kidney disease, or unspecified chronic kidney disease: Secondary | ICD-10-CM | POA: Diagnosis not present

## 2018-10-04 DIAGNOSIS — M25552 Pain in left hip: Secondary | ICD-10-CM | POA: Diagnosis not present

## 2018-10-06 DIAGNOSIS — I130 Hypertensive heart and chronic kidney disease with heart failure and stage 1 through stage 4 chronic kidney disease, or unspecified chronic kidney disease: Secondary | ICD-10-CM | POA: Diagnosis not present

## 2018-10-06 DIAGNOSIS — M545 Low back pain: Secondary | ICD-10-CM | POA: Diagnosis not present

## 2018-10-06 DIAGNOSIS — E1122 Type 2 diabetes mellitus with diabetic chronic kidney disease: Secondary | ICD-10-CM | POA: Diagnosis not present

## 2018-10-06 DIAGNOSIS — I2510 Atherosclerotic heart disease of native coronary artery without angina pectoris: Secondary | ICD-10-CM | POA: Diagnosis not present

## 2018-10-06 DIAGNOSIS — G8911 Acute pain due to trauma: Secondary | ICD-10-CM | POA: Diagnosis not present

## 2018-10-06 DIAGNOSIS — M25552 Pain in left hip: Secondary | ICD-10-CM | POA: Diagnosis not present

## 2018-10-09 NOTE — Addendum Note (Signed)
Addended by: Jannette Spanner on: 10/09/2018 12:23 PM   Modules accepted: Orders

## 2018-10-10 ENCOUNTER — Telehealth: Payer: Self-pay | Admitting: Pulmonary Disease

## 2018-10-10 NOTE — Telephone Encounter (Signed)
Dr. Lake Bells wanted pt to see an allergist so I wanted to let her know before I placed the referral. ATC pt, no answer. Left message for pt to call back.

## 2018-10-11 DIAGNOSIS — I130 Hypertensive heart and chronic kidney disease with heart failure and stage 1 through stage 4 chronic kidney disease, or unspecified chronic kidney disease: Secondary | ICD-10-CM | POA: Diagnosis not present

## 2018-10-11 DIAGNOSIS — I2510 Atherosclerotic heart disease of native coronary artery without angina pectoris: Secondary | ICD-10-CM | POA: Diagnosis not present

## 2018-10-11 DIAGNOSIS — G8911 Acute pain due to trauma: Secondary | ICD-10-CM | POA: Diagnosis not present

## 2018-10-11 DIAGNOSIS — E1122 Type 2 diabetes mellitus with diabetic chronic kidney disease: Secondary | ICD-10-CM | POA: Diagnosis not present

## 2018-10-11 DIAGNOSIS — M545 Low back pain: Secondary | ICD-10-CM | POA: Diagnosis not present

## 2018-10-11 DIAGNOSIS — M25552 Pain in left hip: Secondary | ICD-10-CM | POA: Diagnosis not present

## 2018-10-11 NOTE — Telephone Encounter (Signed)
LMTCB x2 for pt 

## 2018-10-12 NOTE — Telephone Encounter (Signed)
Spoke with pt. She is aware that Dr. Lake Bells wanted her to refer her to an Allergist. Pt is wanting to know why. States that she has been to several allergists and they have never found anything.  Dr. Lake Bells - please advise. Thanks.

## 2018-10-12 NOTE — Telephone Encounter (Signed)
Patient returning phone call.  Patient phone number is [phone].

## 2018-10-12 NOTE — Telephone Encounter (Signed)
LVMTCB x 3 for patient.

## 2018-10-14 ENCOUNTER — Other Ambulatory Visit: Payer: Self-pay | Admitting: Osteopathic Medicine

## 2018-10-14 DIAGNOSIS — E039 Hypothyroidism, unspecified: Secondary | ICD-10-CM

## 2018-10-16 NOTE — Telephone Encounter (Signed)
[facility] refill for levothyroxine. Pls advise, thanks.

## 2018-10-16 NOTE — Telephone Encounter (Signed)
Significant asthma, multiple allergies found on blood work. If she wants to wait and discuss with me in clinic on next visit that's fine too

## 2018-10-16 NOTE — Telephone Encounter (Signed)
LMOMTCB x 1 

## 2018-10-17 ENCOUNTER — Other Ambulatory Visit: Payer: Self-pay | Admitting: Osteopathic Medicine

## 2018-10-17 NOTE — Telephone Encounter (Signed)
Called patient, went straight to voicemail. LMTCB

## 2018-10-17 NOTE — Telephone Encounter (Signed)
30 days sent, needs labs, orders in

## 2018-10-17 NOTE — Telephone Encounter (Signed)
Left a detailed vm msg for pt regarding med refill sent to local pharmacy. Direct call back info provided.  

## 2018-10-17 NOTE — Telephone Encounter (Signed)
Requested medication (s) are due for refill today: no  Requested medication (s) are on the active medication list: yes  Last refill:  10/17/18  Future visit scheduled: no  Notes to clinic:  Pt requesting 90 day refill   Requested Prescriptions  Pending Prescriptions Disp Refills   levothyroxine (SYNTHROID, LEVOTHROID) 88 MCG tablet [Pharmacy Med Name: LEVOTHYROXINE 0.088MG  (88MCG) TAB] 90 tablet     Sig: TAKE 1 TABLET BY MOUTH DAILY BEFORE BREAKFAST     Endocrinology:  Hypothyroid Agents Failed - 10/17/2018  1:13 PM      Failed - TSH needs to be rechecked within 3 months after an abnormal result. Refill until TSH is due.      Failed - TSH in normal range and within 360 days    TSH  Date Value Ref Range Status  09/28/2017 4.87 (H) 0.40 - 4.50 mIU/L Final         Failed - Valid encounter within last 12 months    Recent Outpatient Visits          1 month ago Hypomagnesemia   [place] [facility], Lanelle Bal, DO   2 months ago Type 2 diabetes mellitus with other specified complication, with long-term current use of insulin [facility])   [place] [facility] [place], Lanelle Bal, DO   4 months ago Encounter for immunization   [place], [place], PA-C   5 months ago Type 2 diabetes mellitus with other specified complication, with long-term current use of insulin [facility])   [place], Lanelle Bal, DO   6 months ago Dysphagia, unspecified type   [facility], [place], DO

## 2018-10-18 NOTE — Telephone Encounter (Signed)
Called patient, unable to reach left message to give us a call back. 

## 2018-10-19 NOTE — Telephone Encounter (Signed)
We have attempted to contact pt several times with no success or call back from pt. Per triage protocol, message will be closed.  

## 2018-10-25 ENCOUNTER — Ambulatory Visit: Payer: Medicare Other | Admitting: Adult Health

## 2018-10-28 ENCOUNTER — Other Ambulatory Visit: Payer: Self-pay | Admitting: Osteopathic Medicine

## 2018-10-28 DIAGNOSIS — B0223 Postherpetic polyneuropathy: Secondary | ICD-10-CM

## 2018-10-31 ENCOUNTER — Other Ambulatory Visit: Payer: Self-pay | Admitting: Osteopathic Medicine

## 2018-10-31 NOTE — Telephone Encounter (Signed)
   Requested Prescriptions  Pending Prescriptions Disp Refills   montelukast (SINGULAIR) 10 MG tablet [Pharmacy Med Name: MONTELUKAST 10MG  TABLETS] 90 tablet     Sig: TAKE 1 TABLET BY MOUTH AT BEDTIME     Pulmonology:  Leukotriene Inhibitors Failed - 10/31/2018  8:09 AM      Failed - Valid encounter within last 12 months    Recent Outpatient Visits          2 months ago Hypomagnesemia   [place] [facility], Lanelle Bal, DO   2 months ago Type 2 diabetes mellitus with other specified complication, with long-term current use of insulin [facility])   [place] [facility] [place], Lanelle Bal, DO   4 months ago Encounter for immunization   [facility], [place], PA-C   5 months ago Type 2 diabetes mellitus with other specified complication, with long-term current use of insulin [facility])    [facility], Lanelle Bal, DO   6 months ago Dysphagia, unspecified type   [facility], [place], DO

## 2018-11-07 ENCOUNTER — Encounter: Payer: Self-pay | Admitting: Osteopathic Medicine

## 2018-11-07 ENCOUNTER — Ambulatory Visit (INDEPENDENT_AMBULATORY_CARE_PROVIDER_SITE_OTHER): Payer: Medicare Other

## 2018-11-07 ENCOUNTER — Ambulatory Visit (INDEPENDENT_AMBULATORY_CARE_PROVIDER_SITE_OTHER): Payer: Medicare Other | Admitting: Osteopathic Medicine

## 2018-11-07 VITALS — BP 173/76 | HR 67 | Temp 98.2°F | Wt 159.3 lb

## 2018-11-07 DIAGNOSIS — M545 Low back pain: Secondary | ICD-10-CM | POA: Diagnosis not present

## 2018-11-07 DIAGNOSIS — M47896 Other spondylosis, lumbar region: Secondary | ICD-10-CM | POA: Diagnosis not present

## 2018-11-07 DIAGNOSIS — E1169 Type 2 diabetes mellitus with other specified complication: Secondary | ICD-10-CM

## 2018-11-07 DIAGNOSIS — G8929 Other chronic pain: Secondary | ICD-10-CM

## 2018-11-07 DIAGNOSIS — R2681 Unsteadiness on feet: Secondary | ICD-10-CM

## 2018-11-07 DIAGNOSIS — M5442 Lumbago with sciatica, left side: Secondary | ICD-10-CM | POA: Diagnosis not present

## 2018-11-07 DIAGNOSIS — Z794 Long term (current) use of insulin: Secondary | ICD-10-CM

## 2018-11-07 LAB — POCT GLYCOSYLATED HEMOGLOBIN (HGB A1C): Hemoglobin A1C: 7.2 % — AB (ref 4.0–5.6)

## 2018-11-07 NOTE — Progress Notes (Signed)
HPI: Briana Werner is a 83 y.o. female who  has a past medical history of Asthma, Atrial flutter ([place]), CAD (coronary artery disease), Cerebrovascular disease, Chronic renal insufficiency (02/26/2014), COPD (chronic obstructive pulmonary disease) ([place]) (02/26/2014), Hypothyroid (02/26/2014), Presence of stent in coronary artery in patient with coronary artery disease (02/26/2014), Renal artery stenosis ([place]), and Type 2 diabetes mellitus ([place]) (02/26/2014).  she presents to [facility] today, 11/07/18,  for chief complaint of:  DM2 recheck Leg pain and falls  Diabetes: A1c's have been at goal for age.  She is taking Toujeo 10 units daily, 50 mg daily.  A1c today is 7.2.  Hypertension: Blood pressure on intake 162/52, patient is taking losartan 100 mg daily, carvedilol 12.5 mg twice daily. BP recheck no better, went up to 173/76.    Leg pain: chronic issue, more frequent falls. Hx claudication.  Reports lower back pain, also chronic.  States left leg is a bit weaker.  She previously participated in home physical therapy but did not find this very helpful, she would be open to trying PT here.  Patient is really not sure what medications she is taking, I printed out list from our system and from [place], there are several discrepancies.     At today's visit 11/07/18 ... PMH, PSH, FH reviewed and updated as needed.  Current medication list and allergy/intolerance hx reviewed and updated as needed. (See remainder of HPI, ROS, Phys Exam below)   No results found.  Results for orders placed or performed in visit on 11/07/18 (from the past 72 hour(s))  POCT HgB A1C     Status: Abnormal   Collection Time: 11/07/18  1:38 PM  Result Value Ref Range   Hemoglobin A1C 7.2 (A) 4.0 - 5.6 %   HbA1c POC (<> result, manual entry)     HbA1c, POC (prediabetic range)     HbA1c, POC (controlled diabetic range)            ASSESSMENT/PLAN: The primary encounter  diagnosis was Type 2 diabetes mellitus with other specified complication, with long-term current use of insulin ([place]). Diagnoses of Chronic left-sided low back pain with left-sided sciatica and Gait instability were also pertinent to this visit.  Question claudication pain versus lumbar radiculopathy, will get x-ray lumbar spine and trial physical therapy, low threshold for MRI but patient states she does not really want to do injections or other invasive interventions  Orders Placed This Encounter  Procedures  . DG Lumbar Spine Complete  . Ambulatory referral to Physical Therapy  . POCT HgB A1C       Patient Instructions  Please gather all the medications you are taking at home, including inhalers, insulin pens, over-the-counter medications, any prescription bottles.  Bring everything with you to an office visit so that you, Brandy and I can go over everything and make sure that your medication regimen is safe!       Follow-up plan: Return for pill bottle check in 1-2 weeks (FYI to scheudler need OV40) .                                                 ################################################# ################################################# ################################################# #################################################    Current Meds  Medication Sig  . ADVAIR DISKUS 500-50 MCG/DOSE AEPB INHALE 1 PUFF INTO THE LUNGS TWICE DAILY  . albuterol (PROVENTIL HFA;VENTOLIN  HFA) 108 (90 Base) MCG/ACT inhaler Inhale 2 puffs into the lungs every 6 (six) hours as needed for wheezing.  Marland Kitchen apixaban (ELIQUIS) 2.5 MG TABS tablet Take 1 tablet (2.5 mg total) by mouth 2 (two) times daily.  . Calcium Carbonate-Vitamin D (CALTRATE 600+D PO) Take by mouth 2 (two) times daily.  . carvedilol (COREG) 12.5 MG tablet TAKE 1 TABLET(12.5 MG) BY MOUTH TWICE DAILY WITH A MEAL  . cetirizine (ZYRTEC) 10 MG tablet Take 1 tablet (10 mg  total) by mouth at bedtime.  . diclofenac sodium (VOLTAREN) 1 % GEL Apply 4 g topically 4 (four) times daily. To affected joint.  Marland Kitchen escitalopram (LEXAPRO) 5 MG tablet TAKE 1 TABLET(5 MG) BY MOUTH DAILY  . famotidine (PEPCID) 40 MG tablet Take 1 tablet (40 mg total) by mouth daily.  Marland Kitchen FeFum-FePoly-FA-B Cmp-C-Biot (INTEGRA PLUS) CAPS TAKE 1 CAPSULE BY MOUTH DAILY  . gabapentin (NEURONTIN) 300 MG capsule TAKE 1 CAPSULE(300 MG) BY MOUTH THREE TIMES DAILY  . glucose blood (ONE TOUCH ULTRA TEST) test strip Check blood sugar up to 4 times daily. Dx DM ICD 10 E11.9 insulin dependent.  . Insulin Glargine (TOUJEO SOLOSTAR) 300 UNIT/ML SOPN Inject 10 Units into the skin daily.  . Insulin Pen Needle (BD PEN NEEDLE NANO U/F) 32G X 4 MM MISC Inject into skin once daily. Use with insulin pen. DX DM ICD-10 E11.22  . ipratropium-albuterol (DUONEB) 0.5-2.5 (3) MG/3ML SOLN Take 3 mLs by nebulization every 2 (two) hours as needed (wheeze, SOB, cough).  . IRON PO Take by mouth.  Marland Kitchen JANUVIA 50 MG tablet TAKE 1 TABLET BY MOUTH EVERY DAY  . levothyroxine (SYNTHROID, LEVOTHROID) 88 MCG tablet TAKE 1 TABLET BY MOUTH DAILY BEFORE BREAKFAST  . montelukast (SINGULAIR) 10 MG tablet TAKE 1 TABLET BY MOUTH AT BEDTIME  . Multiple Vitamin (MULTIVITAMIN) capsule Take 1 capsule by mouth.  Marland Kitchen omeprazole (PRILOSEC) 20 MG capsule TAKE 1 CAPSULE BY MOUTH TWICE DAILY   MEDICATIONS:  Current Medications   Medication Sig   albuterol sulfate HFA (PROVENTIL HFA) 108 (90 BASE) MCG/ACT inhaler Inhale 2 puffs into the lungs every 6 (six) hours as needed for Wheezing.   atorvastatin (LIPITOR) 40 mg tablet Take 40 mg by mouth daily.   Calcium Carbonate-Vitamin D (CALTRATE 600+D PO) Take 1 tablet by mouth daily.   carvedilol (COREG) 6.25 mg tablet Take 6.25 mg by mouth 2 (two) times daily with meals.   carvedilol (COREG) 6.25 mg tablet TAKE 1 TABLET BY MOUTH TWICE DAILY WITH MEALS   ELIQUIS 5 MG tablet Take 5 mg by mouth 2 (two) times  daily.   escitalopram (LEXAPRO) 5 MG tablet Take 5 mg by mouth daily.   FeFum-FePoly-FA-B Cmp-C-Biot (INTEGRA PLUS) CAPS Take 1 capsule by mouth daily.   fluticasone-salmeterol (ADVAIR DISKUS) 500-50 mcg/dose AEPB inhalation powder Inhale 1 puff into the lungs 2 (two) times daily.   gabapentin (NEURONTIN) 300 mg capsule Take 300 mg by mouth.   LANTUS SOLOSTAR 100 UNIT/ML pen Inject 10 Units into the skin daily.   levothyroxine sodium (SYNTHROID, LEVOTHROID) 88 mcg tablet Take 88 mcg by mouth daily.   lisinopril (PRINIVIL,ZESTRIL) 10 mg tablet Take 10 mg by mouth daily.   montelukast (SINGULAIR) 10 MG tablet Take 10 mg by mouth at bedtime.   Multiple Vitamin (MULTIVITAMIN) capsule Take 1 capsule by mouth daily.   MYRBETRIQ 50 MG 24 hr tablet Take 50 mg by mouth daily.   oxyCODONE-acetaminophen (PERCOCET) 10-325 mg per tablet Take 1 tablet by  mouth every 8 (eight) hours as needed for Pain.   pantoprazole sodium (PROTONIX) 40 mg tablet Take 40 mg by mouth daily.   rivastigmine (EXELON) 1.5 mg capsule Take 1.5 mg by mouth 2 (two) times daily.   sitaGLIPtin (JANUVIA) 50 mg tablet Take 50 mg by mouth daily.    Allergies  Allergen Reactions  . Tetracycline Shortness Of Breath  . Tetracyclines & Related Anaphylaxis and Shortness Of Breath    dizziness  . Amlodipine Other (See Comments)    Edema   . Amoxicillin Other (See Comments)    Questionable rash   . Latex Dermatitis    Bandages, pulls skin  . Other Dermatitis    Bandaids    . Sulfamethoxazole-Trimethoprim Other (See Comments)    dizzy  dizzy  . Sulfa Antibiotics Rash  . Tape Dermatitis and Rash    Bandaids       Review of Systems:  Constitutional: No recent illness  HEENT: No  headache, no vision change  Cardiac: No  chest pain, No  pressure, No palpitations  Respiratory:  No  shortness of breath. No  Cough  Gastrointestinal: No  abdominal pain, no change on bowel habits  Musculoskeletal:  +myalgia/arthralgia  Skin: No  Rash  Hem/Onc: No  easy bruising/bleeding, No  abnormal lumps/bumps  Neurologic: No  weakness, No  Dizziness  Psychiatric: No  concerns with depression, No  concerns with anxiety  Exam:  BP (!) 173/76 (BP Location: Left Arm, Patient Position: Sitting, Cuff Size: Normal)   Pulse 67   Temp 98.2 F (36.8 C) (Oral)   Wt 159 lb 4.8 oz (72.3 kg)   LMP  (LMP Unknown)   BMI 24.95 kg/m   Constitutional: VS see above. General Appearance: alert, well-developed, well-nourished, NAD  Eyes: Normal lids and conjunctive, non-icteric sclera  Ears, Nose, Mouth, Throat: MMM, Normal external inspection ears/nares/mouth/lips/gums.  Neck: No masses, trachea midline.   Respiratory: Normal respiratory effort. no wheeze, no rhonchi, no rales  Cardiovascular: S1/S2 normal, no murmur, no rub/gallop auscultated. RRR.   Musculoskeletal: Gait normal.  Strength diminished in left leg compared to R, 4/5   Neurological: Normal balance/coordination. No tremor.  Skin: warm, dry, intact.   Psychiatric: Normal judgment/insight. Normal mood and affect. Oriented x3.       Visit summary with medication list and pertinent instructions was printed for patient to review, patient was advised to alert [place] if any updates are needed. All questions at time of visit were answered - patient instructed to contact office with any additional concerns. ER/RTC precautions were reviewed with the patient and understanding verbalized.   Note: Total time spent 25 minutes, greater than 50% of the visit was spent face-to-face counseling and coordinating care for the following: The primary encounter diagnosis was Type 2 diabetes mellitus with other specified complication, with long-term current use of insulin ([place]). Diagnoses of Chronic left-sided low back pain with left-sided sciatica and Gait instability were also pertinent to this visit.Marland Kitchen  Please note: voice recognition software was used to  produce this document, and typos may escape review. Please contact Dr. Sheppard Coil for any needed clarifications.    Follow up plan: Return for pill bottle check in 1-2 weeks (FYI to scheudler need OV40) .

## 2018-11-07 NOTE — Patient Instructions (Signed)
Please gather all the medications you are taking at home, including inhalers, insulin pens, over-the-counter medications, any prescription bottles.  Bring everything with you to an office visit so that you, Brandy and I can go over everything and make sure that your medication regimen is safe!

## 2018-11-16 ENCOUNTER — Ambulatory Visit (INDEPENDENT_AMBULATORY_CARE_PROVIDER_SITE_OTHER): Payer: Medicare Other | Admitting: Osteopathic Medicine

## 2018-11-16 ENCOUNTER — Encounter: Payer: Self-pay | Admitting: Osteopathic Medicine

## 2018-11-16 VITALS — BP 173/64 | HR 67 | Temp 97.6°F | Wt 156.3 lb

## 2018-11-16 DIAGNOSIS — E1169 Type 2 diabetes mellitus with other specified complication: Secondary | ICD-10-CM

## 2018-11-16 DIAGNOSIS — R05 Cough: Secondary | ICD-10-CM

## 2018-11-16 DIAGNOSIS — N184 Chronic kidney disease, stage 4 (severe): Secondary | ICD-10-CM

## 2018-11-16 DIAGNOSIS — I10 Essential (primary) hypertension: Secondary | ICD-10-CM | POA: Diagnosis not present

## 2018-11-16 DIAGNOSIS — R059 Cough, unspecified: Secondary | ICD-10-CM

## 2018-11-16 DIAGNOSIS — R2681 Unsteadiness on feet: Secondary | ICD-10-CM

## 2018-11-16 DIAGNOSIS — R198 Other specified symptoms and signs involving the digestive system and abdomen: Secondary | ICD-10-CM

## 2018-11-16 DIAGNOSIS — J449 Chronic obstructive pulmonary disease, unspecified: Secondary | ICD-10-CM | POA: Diagnosis not present

## 2018-11-16 DIAGNOSIS — Z794 Long term (current) use of insulin: Secondary | ICD-10-CM | POA: Diagnosis not present

## 2018-11-16 DIAGNOSIS — R0989 Other specified symptoms and signs involving the circulatory and respiratory systems: Secondary | ICD-10-CM

## 2018-11-16 MED ORDER — CARVEDILOL 12.5 MG PO TABS: ORAL_TABLET | ORAL | 1 refills | Status: DC

## 2018-11-16 NOTE — Progress Notes (Signed)
HPI: Briana Werner is a 83 y.o. female who  has a past medical history of Asthma, Atrial flutter ([place]), CAD (coronary artery disease), Cerebrovascular disease, Chronic renal insufficiency (02/26/2014), COPD (chronic obstructive pulmonary disease) ([place]) (02/26/2014), Hypothyroid (02/26/2014), Presence of stent in coronary artery in patient with coronary artery disease (02/26/2014), Renal artery stenosis ([place]), and Type 2 diabetes mellitus ([place]) (02/26/2014).  she presents to [facility] today, 11/16/18,  for chief complaint of:  Medication review - bring pill bottles, inhalers, injections, OTC meds, EVERYTHING   Did not bring pill bottles.  Daughter accompanies to visit, has list of Rx Reconciled this list w/ ours, [facility] Very close to ours, few minor discrepancies.  [facility] are not even close to accurate  BP not at goal.  Previoulsy reduced Coreg d/t unsteadiness CKD limits meds, amlodipine caused edema    At today's visit 11/16/18 ... PMH, PSH, FH reviewed and updated as needed.  Current medication list and allergy/intolerance hx reviewed and updated as needed. (See remainder of HPI, ROS, Phys Exam below)       ASSESSMENT/PLAN: The primary encounter diagnosis was Hypertension goal BP (blood pressure) < 140/80. Diagnoses of Type 2 diabetes mellitus with other specified complication, with long-term current use of insulin ([place]), Gait instability, CKD (chronic kidney disease) stage 4, GFR 15-29 ml/min (HCC), Chronic obstructive pulmonary disease, unspecified COPD type ([place]), Globus sensation, and Cough were also pertinent to this visit.    Meds ordered this encounter  Medications  . carvedilol (COREG) 12.5 MG tablet    Sig: Take 2 tablets po in AM, 1 tablet po in PM    Dispense:  180 tablet    Refill:  1    Patient Instructions  Will take Carvedilol 2 pills in the morning (25 mg total) and keep at 1 pill in the evenings (12.5 mg)   I'll  contact the pharmacy about the Pepcid / cancel the Zantac   Checking BP at home, please keep a record and message/call with results in a couple weeks         Follow-up plan: Return for recheck based on BP at home, around 02/05/2019 to recheck A1C / diabetes w/ Dr A .                                                 ################################################# ################################################# ################################################# #################################################    No outpatient medications have been marked as taking for the 11/16/18 encounter (Office Visit) with Emeterio Reeve, DO.    Allergies  Allergen Reactions  . Tetracycline Shortness Of Breath  . Tetracyclines & Related Anaphylaxis and Shortness Of Breath    dizziness  . Amlodipine Other (See Comments)    Edema   . Amoxicillin Other (See Comments)    Questionable rash   . Latex Dermatitis    Bandages, pulls skin  . Other Dermatitis    Bandaids    . Sulfamethoxazole-Trimethoprim Other (See Comments)    dizzy  dizzy  . Sulfa Antibiotics Rash  . Tape Dermatitis and Rash    Bandaids       Review of Systems:  Constitutional: No recent illness  Neurologic: No  weakness, No  Dizziness   Exam:  BP (!) 173/64 (BP Location: Right Arm, Patient Position: Sitting, Cuff Size: Normal)   Pulse 67   Temp 97.6 F (36.4 C) (  Oral)   Wt 156 lb 4.8 oz (70.9 kg)   LMP  (LMP Unknown)   BMI 24.48 kg/m   Constitutional: VS see above. General Appearance: alert, well-developed, well-nourished, NAD  Respiratory: Normal respiratory effort.  Psychiatric: Normal judgment/insight. Normal mood and affect.      Visit summary with medication list and pertinent instructions was printed for patient to review, patient was advised to alert [place] if any updates are needed. All questions at time of visit were answered - patient  instructed to contact office with any additional concerns. ER/RTC precautions were reviewed with the patient and understanding verbalized.   Note: Total time spent 40 minutes, greater than 50% of the visit was spent face-to-face counseling and coordinating care for the following: The primary encounter diagnosis was Hypertension goal BP (blood pressure) < 140/80. Diagnoses of Type 2 diabetes mellitus with other specified complication, with long-term current use of insulin ([place]), Gait instability, CKD (chronic kidney disease) stage 4, GFR 15-29 ml/min (HCC), Chronic obstructive pulmonary disease, unspecified COPD type ([place]), Globus sensation, and Cough were also pertinent to this visit.Marland Kitchen  Please note: voice recognition software was used to produce this document, and typos may escape review. Please contact Dr. Sheppard Coil for any needed clarifications.    Follow up plan: Return for recheck based on BP at home, around 02/05/2019 to recheck A1C / diabetes w/ Dr A .

## 2018-11-16 NOTE — Patient Instructions (Signed)
Will take Carvedilol 2 pills in the morning (25 mg total) and keep at 1 pill in the evenings (12.5 mg)   I'll contact the pharmacy about the Pepcid / cancel the Zantac   Checking BP at home, please keep a record and message/call with results in a couple weeks

## 2018-11-17 ENCOUNTER — Other Ambulatory Visit: Payer: Self-pay

## 2018-11-17 ENCOUNTER — Ambulatory Visit (INDEPENDENT_AMBULATORY_CARE_PROVIDER_SITE_OTHER): Payer: Medicare Other | Admitting: Physical Therapy

## 2018-11-17 ENCOUNTER — Encounter: Payer: Self-pay | Admitting: Physical Therapy

## 2018-11-17 DIAGNOSIS — M79605 Pain in left leg: Secondary | ICD-10-CM | POA: Diagnosis not present

## 2018-11-17 DIAGNOSIS — R293 Abnormal posture: Secondary | ICD-10-CM | POA: Diagnosis not present

## 2018-11-17 DIAGNOSIS — M79604 Pain in right leg: Secondary | ICD-10-CM | POA: Diagnosis not present

## 2018-11-17 DIAGNOSIS — Z9181 History of falling: Secondary | ICD-10-CM | POA: Diagnosis not present

## 2018-11-17 DIAGNOSIS — R2681 Unsteadiness on feet: Secondary | ICD-10-CM

## 2018-11-17 DIAGNOSIS — M6281 Muscle weakness (generalized): Secondary | ICD-10-CM | POA: Diagnosis not present

## 2018-11-17 DIAGNOSIS — R2689 Other abnormalities of gait and mobility: Secondary | ICD-10-CM | POA: Diagnosis not present

## 2018-11-17 NOTE — Therapy (Signed)
[facility] [place]  [place], [place], [address] Phone: [phone]   Fax:  [phone]  Physical Therapy Evaluation  Patient Details  Name: Briana Werner MRN: [id number] Date of Birth: Sep 24, 1935 Referring Provider (PT): Emeterio Reeve, DO   Encounter Date: 11/17/2018  PT End of Session - 11/17/18 1017    Visit Number  1    Number of Visits  16    Date for PT Re-Evaluation  01/12/19    PT Start Time  0845    PT Stop Time  0927    PT Time Calculation (min)  42 min    Equipment Utilized During Treatment  Gait belt    Activity Tolerance  Patient tolerated treatment well    Behavior During Therapy  [facility] for tasks assessed/performed       Past Medical History:  Diagnosis Date  . Asthma   . Atrial flutter ([place])   . CAD (coronary artery disease)   . Cerebrovascular disease   . Chronic renal insufficiency 02/26/2014  . COPD (chronic obstructive pulmonary disease) ([place]) 02/26/2014  . Hypothyroid 02/26/2014  . Presence of stent in coronary artery in patient with coronary artery disease 02/26/2014  . Renal artery stenosis ([place])   . Type 2 diabetes mellitus ([place]) 02/26/2014    Past Surgical History:  Procedure Laterality Date  . ABDOMINAL HYSTERECTOMY    . APPENDECTOMY    . BACK SURGERY    . CHOLECYSTECTOMY    . CORONARY ARTERY BYPASS GRAFT     2004, [place]  . Hip replacement    . KNEE SURGERY Bilateral   . NASAL SINUS SURGERY    . SHOULDER SURGERY    . TONSILLECTOMY      There were no vitals filed for this visit.   Subjective Assessment - 11/17/18 0850    Subjective  Pt is an 83 y/o female who presents to OPPT with c/o bil leg pain and weakness affecting gait and balance.  Pt presents today using RW and hx of falls (reports one episode of falling out of bed).   Pt had HHPT but didn't feel she was getting much benefit from it.    Limitations  Walking;Standing    Diagnostic tests  xrays: arthritis; MRI  ordered    Patient Stated Goals  improve balance, learn to walk without pain    Currently in Pain?  Yes    Pain Score  5    up to 8/10, at best 5/10   Pain Location  Back    Pain Orientation  Left;Right   Lt >Rt   Pain Descriptors / Indicators  Pressure;Radiating    Pain Radiating Towards  bil LEs to just proximal to foot    Pain Onset  More than a month ago    Pain Frequency  Constant    Aggravating Factors   walking    Pain Relieving Factors  topical pain ointment         [facility] PT Assessment - 11/17/18 0856      Assessment   Medical Diagnosis  R26.81 (ICD-10-CM) - Gait instability    Referring Provider (PT)  Emeterio Reeve, DO    Onset Date/Surgical Date  --   1 year   Hand Dominance  Right    Next MD Visit  02/07/2019    Prior Therapy  HHPT      Precautions   Precautions  Fall      Restrictions   Weight Bearing Restrictions  No  Balance Screen   Has the patient fallen in the past 6 months  Yes    How many times?  2 - 1 fall out of bed; 1 fall in hallway    Has the patient had a decrease in activity level because of a fear of falling?   Yes    Is the patient reluctant to leave their home because of a fear of falling?   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    Leisure  play cards, no regular exercise      Cognition   Overall Cognitive Status  Within Functional Limits for tasks assessed      Posture/Postural Control   Posture/Postural Control  Postural limitations    Postural Limitations  Rounded Shoulders;Forward head;Flexed trunk      ROM / Strength   AROM  / PROM / Strength  Strength      Strength   Overall Strength Comments  tested in sitting    Strength Assessment Site  Hip;Knee;Ankle    Right/Left Hip  Right;Left    Right Hip Flexion  4/5    Left Hip Flexion  3-/5    Right/Left Knee  Right;Left    Right Knee Flexion  4/5    Right Knee Extension  5/5    Left Knee Flexion  3-/5    Left Knee Extension  3-/5    Right/Left Ankle  Right;Left    Right Ankle Dorsiflexion  4/5    Left Ankle Dorsiflexion  4/5      Ambulation/Gait   Ambulation/Gait  Yes    Ambulation/Gait Assistance  5: Supervision    Ambulation Distance (Feet)  100 Feet    Assistive device  Rolling walker    Gait Pattern  Decreased stance time - left;Decreased step length - right;Decreased hip/knee flexion - left;Decreased weight shift to left;Trunk flexed    Gait Comments  decreased iniitation      Standardized Balance Assessment   Standardized Balance Assessment  Berg Balance Test;Timed Up and Go Test      Berg Balance Test   Sit to Stand  Able to stand  independently using hands    Standing Unsupported  Able to stand safely 2 minutes    Sitting with Back Unsupported but Feet Supported on Floor or Stool  Able to sit safely and securely 2 minutes    Stand to Sit  Controls descent by using hands    Transfers  Able to transfer with verbal cueing and /or supervision    Standing Unsupported with Eyes Closed  Able to stand 10 seconds with supervision    Standing Unsupported with Feet Together  Able to place feet together independently and stand for 1 minute with supervision    From Standing, Reach Forward with Outstretched Arm  Can reach forward >5 cm safely (2")    From Standing Position, Pick up Object from Floor  Able to pick up shoe, needs supervision  From Standing Position, Turn to Look Behind Over each Shoulder  Needs supervision when turning    Turn 360 Degrees  Needs close supervision or verbal cueing    Standing Unsupported, Alternately Place Feet on Step/Stool   Needs assistance to keep from falling or unable to try    Standing Unsupported, One Foot in [place] to take small step independently and hold 30 seconds    Standing on One Leg  Tries to lift leg/unable to hold 3 seconds but remains standing independently    Total Score  32      Timed Up and Go Test   Normal TUG (seconds)  56.34    TUG Comments  with RW                Objective measurements completed on examination: See above findings.                PT Short Term Goals - 11/17/18 1119      PT SHORT TERM GOAL #1   Title  verbalize understanding of fall prevention strategies to decrease fall risk    Status  New    Target Date  12/15/18      PT SHORT TERM GOAL #2   Title  improve BERG balance score to >/= 38/56 for improved balance    Status  New    Target Date  12/15/18      PT SHORT TERM GOAL #3   Title  improve timed up and go to </= 50 sec for improved mobility    Status  New    Target Date  12/15/18      PT SHORT TERM GOAL #4   Title  report pain < 7/10 with activity for improved function    Status  New    Target Date  12/15/18        PT Long Term Goals - 11/17/18 1122      PT LONG TERM GOAL #1   Title  independent with HEP    Status  New    Target Date  01/12/19      PT LONG TERM GOAL #2   Title  improve BERG balance score to >/= 43/56 for improved balance and decreased fall risk    Status  New    Target Date  01/12/19      PT LONG TERM GOAL #3   Title  improve timed up and go to < 45 sec for improved mobility    Status  New    Target Date  01/12/19      PT LONG TERM GOAL #4   Title  report 50% improvement in mobility and pain for improved function and quality of life    Status  New    Target Date  01/12/19      PT LONG TERM GOAL #5   Title  n/a             Plan - 11/17/18 1017    Clinical Impression Statement  Pt is an 83 y/o female who presents to [facility] for gait instability, likely multifactorial due to decreased  balance, strength and LBP with radicular symptoms due to spinal stenosis.  Pt will benefit from PT to address deficts listed and decrease risk of falls.      Personal Factors and Comorbidities  Age;Comorbidity 3+    Comorbidities  spinal stenosis, bil TKA, Lt hip surgery    Examination-Activity Limitations  Bathing;Locomotion Level;Stand    Examination-Participation  Restrictions  Meal Prep    Stability/Clinical Decision Making  Evolving/Moderate complexity    Clinical Decision Making  Moderate    Rehab Potential  Good    PT Frequency  2x / week    PT Duration  8 weeks    PT Treatment/Interventions  ADLs/Self Care Home Management;Cryotherapy;Moist Heat;DME Instruction;Gait training;Stair training;Functional mobility training;Neuromuscular re-education;Balance training;Therapeutic exercise;Therapeutic activities;Patient/family education;Orthotic Fit/Training;Manual techniques;Taping;Vestibular    PT Next Visit Plan  establish HEP for strengthening/flexibility (flexion based back exercises), balance activities, try gait with lowered walker    Consulted and Agree with Plan of Care  Patient       Patient will benefit from skilled therapeutic intervention in order to improve the following deficits and impairments:  Abnormal gait, Pain, Postural dysfunction, Increased muscle spasms, Decreased mobility, Decreased strength, Impaired flexibility, Decreased balance  Visit Diagnosis: Unsteadiness on feet - Plan: PT plan of care cert/re-cert  Muscle weakness (generalized) - Plan: PT plan of care cert/re-cert  History of falling - Plan: PT plan of care cert/re-cert  Pain in left leg - Plan: PT plan of care cert/re-cert  Pain in right leg - Plan: PT plan of care cert/re-cert  Abnormal posture - Plan: PT plan of care cert/re-cert  Other abnormalities of gait and mobility - Plan: PT plan of care cert/re-cert     Problem List Patient Active Problem List   Diagnosis Date Noted  . Globus sensation  04/13/2018  . Claudication in peripheral vascular disease ([place]) 03/17/2018  . Dysphagia 01/30/2018  . Pulmonary hypertension, primary ([place]) 01/30/2018  . CKD (chronic kidney disease) stage 4, GFR 15-29 ml/min (HCC) 02/25/2017  . Nasal polyposis 12/07/2016  . Cough 07/13/2016  . Tinea pedis 05/25/2016  . Diabetic foot ulcer ([place]) 05/25/2016  . Left hip pain 04/15/2016  . Lumbar radicular pain 01/15/2016  . Aortic atherosclerosis ([place]) 01/12/2016  . Late onset Alzheimer's disease without behavioral disturbance ([place]) 02/27/2015  . Anemia 02/16/2015  . Atrial fibrillation ([place]) 12/25/2014  . Diabetes mellitus with kidney complication ([place]) 13/24/4010  . Impaired mobility and ADLs 07/23/2014  . Spondylisthesis 05/31/2014  . CAD (coronary artery disease) 05/01/2014  . Renal artery stenosis ([place]) 05/01/2014  . Generalized anxiety disorder 04/03/2014  . CKD (chronic kidney disease) stage 3, GFR 30-59 ml/min (HCC) 03/26/2014  . Chronic atrial flutter ([place]) 03/19/2014  . Carotid artery stenosis 03/19/2014  . Hypercalcemia 02/27/2014  . Essential hypertension, benign 02/26/2014  . Hypothyroid 02/26/2014  . Hyperlipidemia 02/26/2014  . COPD (chronic obstructive pulmonary disease) ([place]) 02/26/2014  . Dementia ([place]) 02/26/2014  . Presence of stent in coronary artery in patient with coronary artery disease 02/26/2014  . Nephrolithiasis 02/26/2014  . Diabetic peripheral neuropathy ([place]) 02/26/2014  . DVT, lower extremity, recurrent ([place]) 02/26/2014  . Status post left hip replacement 02/26/2014  . Overactive bladder 02/26/2014  . Chronic diastolic congestive heart failure, NYHA class 2 ([place]) 03/04/2012       Laureen Abrahams, PT, DPT 11/17/18 11:29 AM     [facility] [place], [place], [address] Phone: [phone]   Fax:  [phone]  Name: Briana Werner MRN: [id number] Date of Birth: 05-28-1936

## 2018-11-21 ENCOUNTER — Ambulatory Visit (INDEPENDENT_AMBULATORY_CARE_PROVIDER_SITE_OTHER): Payer: Medicare Other | Admitting: Physical Therapy

## 2018-11-21 ENCOUNTER — Encounter: Payer: Self-pay | Admitting: Physical Therapy

## 2018-11-21 ENCOUNTER — Other Ambulatory Visit: Payer: Self-pay | Admitting: Osteopathic Medicine

## 2018-11-21 DIAGNOSIS — M6281 Muscle weakness (generalized): Secondary | ICD-10-CM | POA: Diagnosis not present

## 2018-11-21 DIAGNOSIS — R293 Abnormal posture: Secondary | ICD-10-CM

## 2018-11-21 DIAGNOSIS — Z9181 History of falling: Secondary | ICD-10-CM

## 2018-11-21 DIAGNOSIS — R2689 Other abnormalities of gait and mobility: Secondary | ICD-10-CM

## 2018-11-21 DIAGNOSIS — M79605 Pain in left leg: Secondary | ICD-10-CM | POA: Diagnosis not present

## 2018-11-21 DIAGNOSIS — M79604 Pain in right leg: Secondary | ICD-10-CM

## 2018-11-21 DIAGNOSIS — B0223 Postherpetic polyneuropathy: Secondary | ICD-10-CM

## 2018-11-21 DIAGNOSIS — R2681 Unsteadiness on feet: Secondary | ICD-10-CM | POA: Diagnosis not present

## 2018-11-21 NOTE — Therapy (Signed)
[place], [place], [address] Phone: [phone]   Fax:  [phone]  Physical Therapy Treatment  Patient Details  Name: Briana Werner MRN: [id number] Date of Birth: 12/31/1935 Referring Provider (PT): Emeterio Reeve, DO   Encounter Date: 11/21/2018  PT End of Session - 11/21/18 1339    Visit Number  2    Number of Visits  16    Date for PT Re-Evaluation  01/12/19    PT Start Time  5643    PT Stop Time  1105    PT Time Calculation (min)  42 min    Equipment Utilized During Treatment  --    Activity Tolerance  Patient tolerated treatment well    Behavior During Therapy  [facility] for tasks assessed/performed       Past Medical History:  Diagnosis Date  . Asthma   . Atrial flutter ([place])   . CAD (coronary artery disease)   . Cerebrovascular disease   . Chronic renal insufficiency 02/26/2014  . COPD (chronic obstructive pulmonary disease) ([place]) 02/26/2014  . Hypothyroid 02/26/2014  . Presence of stent in coronary artery in patient with coronary artery disease 02/26/2014  . Renal artery stenosis ([place])   . Type 2 diabetes mellitus ([place]) 02/26/2014    Past Surgical History:  Procedure Laterality Date  . ABDOMINAL HYSTERECTOMY    . APPENDECTOMY    . BACK SURGERY    . CHOLECYSTECTOMY    . CORONARY ARTERY BYPASS GRAFT     2004, [place]  . Hip replacement    . KNEE SURGERY Bilateral   . NASAL SINUS SURGERY    . SHOULDER SURGERY    . TONSILLECTOMY      There were no vitals filed for this visit.  Subjective Assessment - 11/21/18 1028    Subjective  legs are hurting today. still hurting from fall in December.    Patient Stated Goals  improve balance, learn to walk without pain    Currently in Pain?  Yes    Pain Score  7     Pain Location  Back    Pain Orientation  Left;Right    Pain Descriptors / Indicators  Radiating;Pressure    Pain Onset  More than a month ago    Aggravating Factors    walking    Pain Relieving Factors  topical pain ointment                       [facility] Adult PT Treatment/Exercise - 11/21/18 1030      Self-Care   Self-Care  Other Self-Care Comments    Other Self-Care Comments   educated on continued mobility and benefits of decreasing stiffness and soreness; recommend walking 2-3 min every hour      Exercises   Exercises  Lumbar      Lumbar Exercises: Stretches   Single Knee to Chest Stretch  Right;Left;2 reps;30 seconds    Single Knee to Chest Stretch Limitations  modified on Lt with towel due to limited hip ROM    Piriformis Stretch  Right;Left;2 reps;30 seconds    Piriformis Stretch Limitations  modified and A needed for LLE      Lumbar Exercises: Aerobic   Nustep  L5 x 6 min      Manual Therapy   Manual Therapy  Passive ROM;Joint mobilization    Manual therapy comments  reports improvement in symptoms following manual    Joint Mobilization  Lt hip  gentle LAD with internal rotation    Passive ROM  Lt hip and knee gentle with overpressure               PT Short Term Goals - 11/17/18 1119      PT SHORT TERM GOAL #1   Title  verbalize understanding of fall prevention strategies to decrease fall risk    Status  New    Target Date  12/15/18      PT SHORT TERM GOAL #2   Title  improve BERG balance score to >/= 38/56 for improved balance    Status  New    Target Date  12/15/18      PT SHORT TERM GOAL #3   Title  improve timed up and go to </= 50 sec for improved mobility    Status  New    Target Date  12/15/18      PT SHORT TERM GOAL #4   Title  report pain < 7/10 with activity for improved function    Status  New    Target Date  12/15/18        PT Long Term Goals - 11/17/18 1122      PT LONG TERM GOAL #1   Title  independent with HEP    Status  New    Target Date  01/12/19      PT LONG TERM GOAL #2   Title  improve BERG balance score to >/= 43/56 for improved balance and decreased fall risk     Status  New    Target Date  01/12/19      PT LONG TERM GOAL #3   Title  improve timed up and go to < 45 sec for improved mobility    Status  New    Target Date  01/12/19      PT LONG TERM GOAL #4   Title  report 50% improvement in mobility and pain for improved function and quality of life    Status  New    Target Date  01/12/19      PT LONG TERM GOAL #5   Title  n/a            Plan - 11/21/18 1340    Clinical Impression Statement  Pt tolerated session well today, and further history with pt revealed LLE weakness is new since fall in Dec.  May benefit from advanced imaging to rule out any mechanical injury.  Overall reported some improvement in pain following session today.  Recommended continued mobility for home to help decrease stiffness and pain.    Personal Factors and Comorbidities  Age;Comorbidity 3+    Comorbidities  spinal stenosis, bil TKA, Lt hip surgery    Examination-Activity Limitations  Bathing;Locomotion Level;Stand    Examination-Participation Restrictions  Meal Prep    Stability/Clinical Decision Making  Evolving/Moderate complexity    Rehab Potential  Good    PT Frequency  2x / week    PT Duration  8 weeks    PT Treatment/Interventions  ADLs/Self Care Home Management;Cryotherapy;Moist Heat;DME Instruction;Gait training;Stair training;Functional mobility training;Neuromuscular re-education;Balance training;Therapeutic exercise;Therapeutic activities;Patient/family education;Orthotic Fit/Training;Manual techniques;Taping;Vestibular    PT Next Visit Plan  establish HEP for strengthening/flexibility (flexion based back exercises), balance activities, try gait with lowered walker (unable as pin too big for hole); needs aerobic and strengthening HEP    Consulted and Agree with Plan of Care  Patient       Patient will benefit from skilled therapeutic intervention in order  to improve the following deficits and impairments:  Abnormal gait, Pain, Postural dysfunction,  Increased muscle spasms, Decreased mobility, Decreased strength, Impaired flexibility, Decreased balance  Visit Diagnosis: Unsteadiness on feet  Muscle weakness (generalized)  History of falling  Pain in left leg  Pain in right leg  Abnormal posture  Other abnormalities of gait and mobility     Problem List Patient Active Problem List   Diagnosis Date Noted  . Globus sensation 04/13/2018  . Claudication in peripheral vascular disease ([place]) 03/17/2018  . Dysphagia 01/30/2018  . Pulmonary hypertension, primary ([place]) 01/30/2018  . CKD (chronic kidney disease) stage 4, GFR 15-29 ml/min (HCC) 02/25/2017  . Nasal polyposis 12/07/2016  . Cough 07/13/2016  . Tinea pedis 05/25/2016  . Diabetic foot ulcer ([place]) 05/25/2016  . Left hip pain 04/15/2016  . Lumbar radicular pain 01/15/2016  . Aortic atherosclerosis ([place]) 01/12/2016  . Late onset Alzheimer's disease without behavioral disturbance ([place]) 02/27/2015  . Anemia 02/16/2015  . Atrial fibrillation ([place]) 12/25/2014  . Diabetes mellitus with kidney complication ([place]) 49/44/9675  . Impaired mobility and ADLs 07/23/2014  . Spondylisthesis 05/31/2014  . CAD (coronary artery disease) 05/01/2014  . Renal artery stenosis ([place]) 05/01/2014  . Generalized anxiety disorder 04/03/2014  . CKD (chronic kidney disease) stage 3, GFR 30-59 ml/min (HCC) 03/26/2014  . Chronic atrial flutter ([place]) 03/19/2014  . Carotid artery stenosis 03/19/2014  . Hypercalcemia 02/27/2014  . Essential hypertension, benign 02/26/2014  . Hypothyroid 02/26/2014  . Hyperlipidemia 02/26/2014  . COPD (chronic obstructive pulmonary disease) ([place]) 02/26/2014  . Dementia ([place]) 02/26/2014  . Presence of stent in coronary artery in patient with coronary artery disease 02/26/2014  . Nephrolithiasis 02/26/2014  . Diabetic peripheral neuropathy ([place]) 02/26/2014  . DVT, lower extremity, recurrent ([place]) 02/26/2014  . Status post left hip replacement 02/26/2014  .  Overactive bladder 02/26/2014  . Chronic diastolic congestive heart failure, NYHA class 2 ([place]) 03/04/2012      Laureen Abrahams, PT, DPT 11/21/18 1:42 PM      [facility] [place], [place], [address] Phone: [phone]   Fax:  [phone]  Name: Briana Werner MRN: [id number] Date of Birth: June 05, 1936

## 2018-11-22 ENCOUNTER — Other Ambulatory Visit: Payer: Self-pay

## 2018-11-22 MED ORDER — FAMOTIDINE 40 MG PO TABS: 40.0000 mg | ORAL_TABLET | Freq: Every day | ORAL | 1 refills | Status: AC

## 2018-11-23 ENCOUNTER — Other Ambulatory Visit: Payer: Self-pay | Admitting: Osteopathic Medicine

## 2018-11-23 ENCOUNTER — Ambulatory Visit (INDEPENDENT_AMBULATORY_CARE_PROVIDER_SITE_OTHER): Payer: Medicare Other | Admitting: Physical Therapy

## 2018-11-23 ENCOUNTER — Other Ambulatory Visit: Payer: Self-pay

## 2018-11-23 ENCOUNTER — Encounter: Payer: Self-pay | Admitting: Physical Therapy

## 2018-11-23 DIAGNOSIS — Z9181 History of falling: Secondary | ICD-10-CM

## 2018-11-23 DIAGNOSIS — M79605 Pain in left leg: Secondary | ICD-10-CM

## 2018-11-23 DIAGNOSIS — R2681 Unsteadiness on feet: Secondary | ICD-10-CM

## 2018-11-23 DIAGNOSIS — M79604 Pain in right leg: Secondary | ICD-10-CM

## 2018-11-23 DIAGNOSIS — M6281 Muscle weakness (generalized): Secondary | ICD-10-CM

## 2018-11-23 NOTE — Patient Instructions (Signed)
Access Code: [email or web address]  URL: [email or web address]  Date: 11/23/2018  Prepared by: Kerin Perna   Exercises  Seated Piriformis Stretch - 2 reps - 1 sets - 15-30 seconds hold - 2x daily - 7x weekly  Seated Hamstring Stretch - 2 reps - 1 sets - 15-30 seconds hold - 2x daily - 7x weekly  Seated March - 10 reps - 2 sets - 1x daily - 7x weekly  Hooklying Single Knee to Chest - 2 reps - 1 sets - 15 seconds hold - 2x daily - 7x weekly  Tandem Stance with Support - 2 reps - 1 sets - 15-30 seconds hold - 1x daily - 7x weekly

## 2018-11-23 NOTE — Therapy (Signed)
[place], [place], [address] Phone: [phone]   Fax:  [phone]  Physical Therapy Treatment  Patient Details  Name: Briana Werner MRN: [id number] Date of Birth: 1936/02/10 Referring Provider (PT): Emeterio Reeve, DO   Encounter Date: 11/23/2018  PT End of Session - 11/23/18 1023    Visit Number  3    Number of Visits  16    Date for PT Re-Evaluation  01/12/19    PT Start Time  1016    PT Stop Time  1108   MHP last 12 min    PT Time Calculation (min)  52 min       Past Medical History:  Diagnosis Date  . Asthma   . Atrial flutter ([place])   . CAD (coronary artery disease)   . Cerebrovascular disease   . Chronic renal insufficiency 02/26/2014  . COPD (chronic obstructive pulmonary disease) ([place]) 02/26/2014  . Hypothyroid 02/26/2014  . Presence of stent in coronary artery in patient with coronary artery disease 02/26/2014  . Renal artery stenosis ([place])   . Type 2 diabetes mellitus ([place]) 02/26/2014    Past Surgical History:  Procedure Laterality Date  . ABDOMINAL HYSTERECTOMY    . APPENDECTOMY    . BACK SURGERY    . CHOLECYSTECTOMY    . CORONARY ARTERY BYPASS GRAFT     2004, [place]  . Hip replacement    . KNEE SURGERY Bilateral   . NASAL SINUS SURGERY    . SHOULDER SURGERY    . TONSILLECTOMY      There were no vitals filed for this visit.  Subjective Assessment - 11/23/18 1024    Subjective  Pt reports her back is now hurting as well.  She has been doing her "exercises" of walking and laying down crossing legs.       Patient Stated Goals  improve balance, learn to walk without pain    Currently in Pain?  Yes    Pain Score  4     Pain Location  Back    Pain Orientation  Right;Left;Lower    Pain Descriptors / Indicators  Aching    Aggravating Factors   walking     Pain Relieving Factors  resting          [facility] PT Assessment - 11/23/18 0001      Assessment   Medical  Diagnosis  R26.81 (ICD-10-CM) - Gait instability    Referring Provider (PT)  Emeterio Reeve, DO    Onset Date/Surgical Date  --   1 year   Hand Dominance  Right    Next MD Visit  02/07/2019    Prior Therapy  HHPT       [facility] Adult PT Treatment/Exercise - 11/23/18 0001      Lumbar Exercises: Stretches   Passive Hamstring Stretch  Right;Left;2 reps;20 seconds   seated, straight back   Single Knee to Chest Stretch  Left;Right;2 reps    Single Knee to Chest Stretch Limitations  modified on Lt with towel due to limited hip ROM    Quad Stretch  Right;Left;2 reps;20 seconds   foot under chair   Piriformis Stretch  Right;Left;2 reps;10 seconds;20 seconds   seated, UE assist to get leg crossed.    Piriformis Stretch Limitations  limited ability with LLE      Lumbar Exercises: Aerobic   Nustep  L4: 6 min (legs only)      Lumbar Exercises: Standing   Other Standing  Lumbar Exercises  standing with upright posture with RW x 15 sec.     Other Standing Lumbar Exercises  semi tandem stance (with close SBA, pt in RW) x 15 sec x 2 reps each foot forward.       Lumbar Exercises: Seated   Sit to Stand  5 reps   UE support    Other Seated Lumbar Exercises  ankle DF/PF x 20     Other Seated Lumbar Exercises  seated marching x 10, 2 sets      Modalities   Modalities  Moist Heat      Moist Heat Therapy   Number Minutes Moist Heat  12 Minutes    Moist Heat Location  Knee;Lumbar Spine               PT Short Term Goals - 11/17/18 1119      PT SHORT TERM GOAL #1   Title  verbalize understanding of fall prevention strategies to decrease fall risk    Status  New    Target Date  12/15/18      PT SHORT TERM GOAL #2   Title  improve BERG balance score to >/= 38/56 for improved balance    Status  New    Target Date  12/15/18      PT SHORT TERM GOAL #3   Title  improve timed up and go to </= 50 sec for improved mobility    Status  New    Target Date  12/15/18      PT SHORT TERM  GOAL #4   Title  report pain < 7/10 with activity for improved function    Status  New    Target Date  12/15/18        PT Long Term Goals - 11/17/18 1122      PT LONG TERM GOAL #1   Title  independent with HEP    Status  New    Target Date  01/12/19      PT LONG TERM GOAL #2   Title  improve BERG balance score to >/= 43/56 for improved balance and decreased fall risk    Status  New    Target Date  01/12/19      PT LONG TERM GOAL #3   Title  improve timed up and go to < 45 sec for improved mobility    Status  New    Target Date  01/12/19      PT LONG TERM GOAL #4   Title  report 50% improvement in mobility and pain for improved function and quality of life    Status  New    Target Date  01/12/19      PT LONG TERM GOAL #5   Title  n/a            Plan - 11/23/18 1043    Clinical Impression Statement  Pt reported increased pain in Lt knee with exercises today;  seated hamstring stretch, seated marching.  No increase in back pain during exercises.  Pt reported reduction of pain after use of MHP at end of session.      Rehab Potential  Good    PT Frequency  2x / week    PT Duration  8 weeks    PT Treatment/Interventions  ADLs/Self Care Home Management;Cryotherapy;Moist Heat;DME Instruction;Gait training;Stair training;Functional mobility training;Neuromuscular re-education;Balance training;Therapeutic exercise;Therapeutic activities;Patient/family education;Orthotic Fit/Training;Manual techniques;Taping;Vestibular    PT Next Visit Plan  continue gentle strengthening/flexibility, flexion based back exercises;  balance activities.     PT Home Exercise Plan  Access Code: [email or web address]    Consulted and Agree with Plan of Care  Patient       Patient will benefit from skilled therapeutic intervention in order to improve the following deficits and impairments:  Abnormal gait, Pain, Postural dysfunction, Increased muscle spasms, Decreased mobility, Decreased strength, Impaired  flexibility, Decreased balance  Visit Diagnosis: Unsteadiness on feet  Muscle weakness (generalized)  History of falling  Pain in left leg  Pain in right leg     Problem List Patient Active Problem List   Diagnosis Date Noted  . Globus sensation 04/13/2018  . Claudication in peripheral vascular disease ([place]) 03/17/2018  . Dysphagia 01/30/2018  . Pulmonary hypertension, primary ([place]) 01/30/2018  . CKD (chronic kidney disease) stage 4, GFR 15-29 ml/min (HCC) 02/25/2017  . Nasal polyposis 12/07/2016  . Cough 07/13/2016  . Tinea pedis 05/25/2016  . Diabetic foot ulcer ([place]) 05/25/2016  . Left hip pain 04/15/2016  . Lumbar radicular pain 01/15/2016  . Aortic atherosclerosis ([place]) 01/12/2016  . Late onset Alzheimer's disease without behavioral disturbance ([place]) 02/27/2015  . Anemia 02/16/2015  . Atrial fibrillation ([place]) 12/25/2014  . Diabetes mellitus with kidney complication ([place]) 15/01/6978  . Impaired mobility and ADLs 07/23/2014  . Spondylisthesis 05/31/2014  . CAD (coronary artery disease) 05/01/2014  . Renal artery stenosis ([place]) 05/01/2014  . Generalized anxiety disorder 04/03/2014  . CKD (chronic kidney disease) stage 3, GFR 30-59 ml/min (HCC) 03/26/2014  . Chronic atrial flutter ([place]) 03/19/2014  . Carotid artery stenosis 03/19/2014  . Hypercalcemia 02/27/2014  . Essential hypertension, benign 02/26/2014  . Hypothyroid 02/26/2014  . Hyperlipidemia 02/26/2014  . COPD (chronic obstructive pulmonary disease) ([place]) 02/26/2014  . Dementia ([place]) 02/26/2014  . Presence of stent in coronary artery in patient with coronary artery disease 02/26/2014  . Nephrolithiasis 02/26/2014  . Diabetic peripheral neuropathy ([place]) 02/26/2014  . DVT, lower extremity, recurrent ([place]) 02/26/2014  . Status post left hip replacement 02/26/2014  . Overactive bladder 02/26/2014  . Chronic diastolic congestive heart failure, NYHA class 2 ([place]) 03/04/2012   Kerin Perna,  PTA 11/23/18 3:54 PM  [place], [place], [address] Phone: [phone]   Fax:  [phone]  Name: Briana Werner MRN: [id number] Date of Birth: 1936-01-17

## 2018-11-28 ENCOUNTER — Encounter: Payer: Self-pay | Admitting: Physical Therapy

## 2018-11-28 ENCOUNTER — Other Ambulatory Visit: Payer: Self-pay

## 2018-11-28 ENCOUNTER — Ambulatory Visit (INDEPENDENT_AMBULATORY_CARE_PROVIDER_SITE_OTHER): Payer: Medicare Other | Admitting: Physical Therapy

## 2018-11-28 DIAGNOSIS — Z9181 History of falling: Secondary | ICD-10-CM | POA: Diagnosis not present

## 2018-11-28 DIAGNOSIS — M79604 Pain in right leg: Secondary | ICD-10-CM | POA: Diagnosis not present

## 2018-11-28 DIAGNOSIS — R2689 Other abnormalities of gait and mobility: Secondary | ICD-10-CM

## 2018-11-28 DIAGNOSIS — M79605 Pain in left leg: Secondary | ICD-10-CM

## 2018-11-28 DIAGNOSIS — M6281 Muscle weakness (generalized): Secondary | ICD-10-CM | POA: Diagnosis not present

## 2018-11-28 DIAGNOSIS — R293 Abnormal posture: Secondary | ICD-10-CM

## 2018-11-28 DIAGNOSIS — R2681 Unsteadiness on feet: Secondary | ICD-10-CM | POA: Diagnosis not present

## 2018-11-28 NOTE — Therapy (Signed)
[place], [place], [address] Phone: [phone]   Fax:  [phone]  Physical Therapy Treatment  Patient Details  Name: Briana Werner MRN: [id number] Date of Birth: 12/08/1935 Referring Provider (PT): Emeterio Reeve, DO   Encounter Date: 11/28/2018  PT End of Session - 11/28/18 1240    Visit Number  4    Number of Visits  16    Date for PT Re-Evaluation  01/12/19    PT Start Time  8366    PT Stop Time  1246    PT Time Calculation (min)  51 min    Activity Tolerance  Patient tolerated treatment well    Behavior During Therapy  [facility] for tasks assessed/performed       Past Medical History:  Diagnosis Date  . Asthma   . Atrial flutter ([place])   . CAD (coronary artery disease)   . Cerebrovascular disease   . Chronic renal insufficiency 02/26/2014  . COPD (chronic obstructive pulmonary disease) ([place]) 02/26/2014  . Hypothyroid 02/26/2014  . Presence of stent in coronary artery in patient with coronary artery disease 02/26/2014  . Renal artery stenosis ([place])   . Type 2 diabetes mellitus ([place]) 02/26/2014    Past Surgical History:  Procedure Laterality Date  . ABDOMINAL HYSTERECTOMY    . APPENDECTOMY    . BACK SURGERY    . CHOLECYSTECTOMY    . CORONARY ARTERY BYPASS GRAFT     2004, [place]  . Hip replacement    . KNEE SURGERY Bilateral   . NASAL SINUS SURGERY    . SHOULDER SURGERY    . TONSILLECTOMY      There were no vitals filed for this visit.  Subjective Assessment - 11/28/18 1159    Subjective  doing well; did her exercises.  feels about the same.  enjoys exercises.    Patient Stated Goals  improve balance, learn to walk without pain    Currently in Pain?  No/denies                       [facility] Adult PT Treatment/Exercise - 11/28/18 1201      Lumbar Exercises: Stretches   Passive Hamstring Stretch  Right;Left;2 reps;20 seconds   seated, straight back   Single  Knee to Chest Stretch  Left;Right;2 reps    Single Knee to Chest Stretch Limitations  modified on Lt with towel due to limited hip ROM    Piriformis Stretch  Right;Left;2 reps;30 seconds   knee to opposite shoulder; use of towel to assist     Lumbar Exercises: Aerobic   Nustep  L4: 6 min (legs only)      Lumbar Exercises: Seated   Long Arc Quad on Chair  Both;10 reps    Sit to Stand  10 reps   UE support   Other Seated Lumbar Exercises  ankle DF/PF x 20     Other Seated Lumbar Exercises  seated marching x 10, 2 sets      Moist Heat Therapy   Number Minutes Moist Heat  12 Minutes    Moist Heat Location  Lumbar Spine               PT Short Term Goals - 11/17/18 1119      PT SHORT TERM GOAL #1   Title  verbalize understanding of fall prevention strategies to decrease fall risk    Status  New    Target Date  12/15/18      PT SHORT TERM GOAL #2   Title  improve BERG balance score to >/= 38/56 for improved balance    Status  New    Target Date  12/15/18      PT SHORT TERM GOAL #3   Title  improve timed up and go to </= 50 sec for improved mobility    Status  New    Target Date  12/15/18      PT SHORT TERM GOAL #4   Title  report pain < 7/10 with activity for improved function    Status  New    Target Date  12/15/18        PT Long Term Goals - 11/17/18 1122      PT LONG TERM GOAL #1   Title  independent with HEP    Status  New    Target Date  01/12/19      PT LONG TERM GOAL #2   Title  improve BERG balance score to >/= 43/56 for improved balance and decreased fall risk    Status  New    Target Date  01/12/19      PT LONG TERM GOAL #3   Title  improve timed up and go to < 45 sec for improved mobility    Status  New    Target Date  01/12/19      PT LONG TERM GOAL #4   Title  report 50% improvement in mobility and pain for improved function and quality of life    Status  New    Target Date  01/12/19      PT LONG TERM GOAL #5   Title  n/a             Plan - 11/28/18 1241    Clinical Impression Statement  Pt reports exercises seem to be helping with pain and mobility.  Slowly progressing with PT.    Rehab Potential  Good    PT Frequency  2x / week    PT Duration  8 weeks    PT Treatment/Interventions  ADLs/Self Care Home Management;Cryotherapy;Moist Heat;DME Instruction;Gait training;Stair training;Functional mobility training;Neuromuscular re-education;Balance training;Therapeutic exercise;Therapeutic activities;Patient/family education;Orthotic Fit/Training;Manual techniques;Taping;Vestibular    PT Next Visit Plan  continue gentle strengthening/flexibility, flexion based back exercises; balance activities.     PT Home Exercise Plan  Access Code: [email or web address]    Consulted and Agree with Plan of Care  Patient       Patient will benefit from skilled therapeutic intervention in order to improve the following deficits and impairments:  Abnormal gait, Pain, Postural dysfunction, Increased muscle spasms, Decreased mobility, Decreased strength, Impaired flexibility, Decreased balance  Visit Diagnosis: Unsteadiness on feet  Muscle weakness (generalized)  History of falling  Pain in left leg  Pain in right leg  Abnormal posture  Other abnormalities of gait and mobility     Problem List Patient Active Problem List   Diagnosis Date Noted  . Globus sensation 04/13/2018  . Claudication in peripheral vascular disease ([place]) 03/17/2018  . Dysphagia 01/30/2018  . Pulmonary hypertension, primary ([place]) 01/30/2018  . CKD (chronic kidney disease) stage 4, GFR 15-29 ml/min (HCC) 02/25/2017  . Nasal polyposis 12/07/2016  . Cough 07/13/2016  . Tinea pedis 05/25/2016  . Diabetic foot ulcer ([place]) 05/25/2016  . Left hip pain 04/15/2016  . Lumbar radicular pain 01/15/2016  . Aortic atherosclerosis ([place]) 01/12/2016  . Late onset Alzheimer's disease without behavioral disturbance ([place]) 02/27/2015  . Anemia 02/16/2015  .  Atrial  fibrillation ([place]) 12/25/2014  . Diabetes mellitus with kidney complication ([place]) 42/87/6811  . Impaired mobility and ADLs 07/23/2014  . Spondylisthesis 05/31/2014  . CAD (coronary artery disease) 05/01/2014  . Renal artery stenosis ([place]) 05/01/2014  . Generalized anxiety disorder 04/03/2014  . CKD (chronic kidney disease) stage 3, GFR 30-59 ml/min (HCC) 03/26/2014  . Chronic atrial flutter ([place]) 03/19/2014  . Carotid artery stenosis 03/19/2014  . Hypercalcemia 02/27/2014  . Essential hypertension, benign 02/26/2014  . Hypothyroid 02/26/2014  . Hyperlipidemia 02/26/2014  . COPD (chronic obstructive pulmonary disease) ([place]) 02/26/2014  . Dementia ([place]) 02/26/2014  . Presence of stent in coronary artery in patient with coronary artery disease 02/26/2014  . Nephrolithiasis 02/26/2014  . Diabetic peripheral neuropathy ([place]) 02/26/2014  . DVT, lower extremity, recurrent ([place]) 02/26/2014  . Status post left hip replacement 02/26/2014  . Overactive bladder 02/26/2014  . Chronic diastolic congestive heart failure, NYHA class 2 ([place]) 03/04/2012      Laureen Abrahams, PT, DPT 11/28/18 12:42 PM     [facility] [place], [place], [address] Phone: [phone]   Fax:  [phone]  Name: Briana Werner MRN: [id number] Date of Birth: [birth year]

## 2018-11-30 ENCOUNTER — Other Ambulatory Visit: Payer: Self-pay

## 2018-11-30 ENCOUNTER — Encounter: Payer: Self-pay | Admitting: Physical Therapy

## 2018-11-30 ENCOUNTER — Ambulatory Visit (INDEPENDENT_AMBULATORY_CARE_PROVIDER_SITE_OTHER): Payer: Medicare Other | Admitting: Physical Therapy

## 2018-11-30 DIAGNOSIS — R293 Abnormal posture: Secondary | ICD-10-CM

## 2018-11-30 DIAGNOSIS — M79605 Pain in left leg: Secondary | ICD-10-CM

## 2018-11-30 DIAGNOSIS — M6281 Muscle weakness (generalized): Secondary | ICD-10-CM

## 2018-11-30 DIAGNOSIS — Z9181 History of falling: Secondary | ICD-10-CM

## 2018-11-30 DIAGNOSIS — M79604 Pain in right leg: Secondary | ICD-10-CM | POA: Diagnosis not present

## 2018-11-30 DIAGNOSIS — R2681 Unsteadiness on feet: Secondary | ICD-10-CM | POA: Diagnosis not present

## 2018-11-30 DIAGNOSIS — R2689 Other abnormalities of gait and mobility: Secondary | ICD-10-CM

## 2018-11-30 NOTE — Therapy (Addendum)
[place], [place], [address] Phone: [phone]   Fax:  [phone]  Physical Therapy Treatment/Discharge  Patient Details  Name: Briana Werner MRN: [id number] Date of Birth: 1936-05-13 Referring Provider (PT): Emeterio Reeve, DO   Encounter Date: 11/30/2018  PT End of Session - 11/30/18 1103    Visit Number  5    Number of Visits  16    Date for PT Re-Evaluation  01/12/19    PT Start Time  1005    PT Stop Time  1045    PT Time Calculation (min)  40 min    Activity Tolerance  Patient tolerated treatment well    Behavior During Therapy  [facility] for tasks assessed/performed       Past Medical History:  Diagnosis Date  . Asthma   . Atrial flutter ([place])   . CAD (coronary artery disease)   . Cerebrovascular disease   . Chronic renal insufficiency 02/26/2014  . COPD (chronic obstructive pulmonary disease) ([place]) 02/26/2014  . Hypothyroid 02/26/2014  . Presence of stent in coronary artery in patient with coronary artery disease 02/26/2014  . Renal artery stenosis ([place])   . Type 2 diabetes mellitus ([place]) 02/26/2014    Past Surgical History:  Procedure Laterality Date  . ABDOMINAL HYSTERECTOMY    . APPENDECTOMY    . BACK SURGERY    . CHOLECYSTECTOMY    . CORONARY ARTERY BYPASS GRAFT     2004, [place]  . Hip replacement    . KNEE SURGERY Bilateral   . NASAL SINUS SURGERY    . SHOULDER SURGERY    . TONSILLECTOMY      There were no vitals filed for this visit.  Subjective Assessment - 11/30/18 1009    Subjective  still feels the same; pain in bil legs with walking.    Patient Stated Goals  improve balance, learn to walk without pain    Currently in Pain?  Yes    Pain Score  4     Pain Location  Knee    Pain Orientation  Left;Right   Lt>Rt   Pain Descriptors / Indicators  Aching    Pain Type  Chronic pain    Pain Onset  More than a month ago    Pain Frequency  Constant    Aggravating  Factors   walking    Pain Relieving Factors  resting                       [facility] Adult PT Treatment/Exercise - 11/30/18 1011      Exercises   Exercises  Knee/Hip      Lumbar Exercises: Stretches   Single Knee to Chest Stretch  Left;Right;2 reps    Single Knee to Chest Stretch Limitations  modified on Lt with towel due to limited hip ROM    Piriformis Stretch  Right;Left;2 reps;30 seconds   knee to opposite shoulder; use of towel to assist   Other Lumbar Stretch Exercise  seated trunk rotation and overhead lateral lean 5x10 sec each direction      Lumbar Exercises: Aerobic   Nustep  L5 x 6 min (UE/LE only)      Lumbar Exercises: Supine   Clam  10 reps    Clam Limitations  isometric with blue theraband x 5 sec hold      Knee/Hip Exercises: Supine   Short Arc Quad Sets  Left;10 reps    Short Arc [employer]  Sets Limitations  3 sec hold    Hip Adduction Isometric  Both;10 reps    Hip Adduction Isometric Limitations  5 sec hold    Other Supine Knee/Hip Exercises  isometric hooklying march left x 10 reps               PT Short Term Goals - 11/17/18 1119      PT SHORT TERM GOAL #1   Title  verbalize understanding of fall prevention strategies to decrease fall risk    Status  New    Target Date  12/15/18      PT SHORT TERM GOAL #2   Title  improve BERG balance score to >/= 38/56 for improved balance    Status  New    Target Date  12/15/18      PT SHORT TERM GOAL #3   Title  improve timed up and go to </= 50 sec for improved mobility    Status  New    Target Date  12/15/18      PT SHORT TERM GOAL #4   Title  report pain < 7/10 with activity for improved function    Status  New    Target Date  12/15/18        PT Long Term Goals - 11/17/18 1122      PT LONG TERM GOAL #1   Title  independent with HEP    Status  New    Target Date  01/12/19      PT LONG TERM GOAL #2   Title  improve BERG balance score to >/= 43/56 for improved balance and decreased  fall risk    Status  New    Target Date  01/12/19      PT LONG TERM GOAL #3   Title  improve timed up and go to < 45 sec for improved mobility    Status  New    Target Date  01/12/19      PT LONG TERM GOAL #4   Title  report 50% improvement in mobility and pain for improved function and quality of life    Status  New    Target Date  01/12/19      PT LONG TERM GOAL #5   Title  n/a            Plan - 11/30/18 1103    Clinical Impression Statement  Pt continues to be limited due to pain and weakness with all activity.  Pain increases with weight bearing activities.  Recommend pt considering calling MD to see if any other recommendations are indicated.    Rehab Potential  Good    PT Frequency  2x / week    PT Duration  8 weeks    PT Treatment/Interventions  ADLs/Self Care Home Management;Cryotherapy;Moist Heat;DME Instruction;Gait training;Stair training;Functional mobility training;Neuromuscular re-education;Balance training;Therapeutic exercise;Therapeutic activities;Patient/family education;Orthotic Fit/Training;Manual techniques;Taping;Vestibular    PT Next Visit Plan  continue gentle strengthening/flexibility, flexion based back exercises; balance activities.     PT Home Exercise Plan  Access Code: [email or web address]    Consulted and Agree with Plan of Care  Patient       Patient will benefit from skilled therapeutic intervention in order to improve the following deficits and impairments:  Abnormal gait, Pain, Postural dysfunction, Increased muscle spasms, Decreased mobility, Decreased strength, Impaired flexibility, Decreased balance, Obesity  Visit Diagnosis: Unsteadiness on feet  Muscle weakness (generalized)  History of falling  Pain in left leg  Pain in right leg  Abnormal posture  Other abnormalities of gait and mobility     Problem List Patient Active Problem List   Diagnosis Date Noted  . Globus sensation 04/13/2018  . Claudication in peripheral vascular  disease ([place]) 03/17/2018  . Dysphagia 01/30/2018  . Pulmonary hypertension, primary ([place]) 01/30/2018  . CKD (chronic kidney disease) stage 4, GFR 15-29 ml/min (HCC) 02/25/2017  . Nasal polyposis 12/07/2016  . Cough 07/13/2016  . Tinea pedis 05/25/2016  . Diabetic foot ulcer ([place]) 05/25/2016  . Left hip pain 04/15/2016  . Lumbar radicular pain 01/15/2016  . Aortic atherosclerosis ([place]) 01/12/2016  . Late onset Alzheimer's disease without behavioral disturbance ([place]) 02/27/2015  . Anemia 02/16/2015  . Atrial fibrillation ([place]) 12/25/2014  . Diabetes mellitus with kidney complication ([place]) 61/60/7371  . Impaired mobility and ADLs 07/23/2014  . Spondylisthesis 05/31/2014  . CAD (coronary artery disease) 05/01/2014  . Renal artery stenosis ([place]) 05/01/2014  . Generalized anxiety disorder 04/03/2014  . CKD (chronic kidney disease) stage 3, GFR 30-59 ml/min (HCC) 03/26/2014  . Chronic atrial flutter ([place]) 03/19/2014  . Carotid artery stenosis 03/19/2014  . Hypercalcemia 02/27/2014  . Essential hypertension, benign 02/26/2014  . Hypothyroid 02/26/2014  . Hyperlipidemia 02/26/2014  . COPD (chronic obstructive pulmonary disease) ([place]) 02/26/2014  . Dementia ([place]) 02/26/2014  . Presence of stent in coronary artery in patient with coronary artery disease 02/26/2014  . Nephrolithiasis 02/26/2014  . Diabetic peripheral neuropathy ([place]) 02/26/2014  . DVT, lower extremity, recurrent ([place]) 02/26/2014  . Status post left hip replacement 02/26/2014  . Overactive bladder 02/26/2014  . Chronic diastolic congestive heart failure, NYHA class 2 ([place]) 03/04/2012      Laureen Abrahams, PT, DPT 11/30/18 11:07 AM   [place], [place], [address] Phone: [phone]   Fax:  [phone]  Name: Briana Werner MRN: [id number] Date of Birth: 1936/03/19     PHYSICAL THERAPY DISCHARGE SUMMARY  Visits from Start of  Care: 5  Current functional level related to goals / functional outcomes: See above   Remaining deficits: Unknown, pt did not return after clinic closure   Education / Equipment: HEP  Plan: Patient agrees to discharge.  Patient goals were not met. Patient is being discharged due to not returning since the last visit.  ?????     Laureen Abrahams, PT, DPT 02/26/19 1:10 PM  [facility] [place], [place] [address]  [phone] (office) [phone] (fax)

## 2018-12-01 ENCOUNTER — Encounter: Payer: Medicare Other | Admitting: Physical Therapy

## 2018-12-01 ENCOUNTER — Telehealth: Payer: Self-pay | Admitting: Physical Therapy

## 2018-12-01 NOTE — Telephone Encounter (Signed)
LVM for pt due to clinic closure to reschedule.  Advised to call office for any questions.  Laureen Abrahams, PT, DPT 12/01/18 8:20 PM

## 2018-12-05 ENCOUNTER — Encounter: Payer: Medicare Other | Admitting: Physical Therapy

## 2018-12-07 ENCOUNTER — Encounter: Payer: Medicare Other | Admitting: Physical Therapy

## 2018-12-15 ENCOUNTER — Other Ambulatory Visit: Payer: Self-pay

## 2018-12-15 ENCOUNTER — Ambulatory Visit (INDEPENDENT_AMBULATORY_CARE_PROVIDER_SITE_OTHER): Payer: Medicare Other | Admitting: Osteopathic Medicine

## 2018-12-15 ENCOUNTER — Encounter: Payer: Self-pay | Admitting: Osteopathic Medicine

## 2018-12-15 ENCOUNTER — Ambulatory Visit (INDEPENDENT_AMBULATORY_CARE_PROVIDER_SITE_OTHER): Payer: Medicare Other

## 2018-12-15 VITALS — BP 147/89 | HR 76 | Wt 149.1 lb

## 2018-12-15 DIAGNOSIS — R0602 Shortness of breath: Secondary | ICD-10-CM

## 2018-12-15 DIAGNOSIS — R5381 Other malaise: Secondary | ICD-10-CM

## 2018-12-15 DIAGNOSIS — R399 Unspecified symptoms and signs involving the genitourinary system: Secondary | ICD-10-CM

## 2018-12-15 LAB — POCT URINALYSIS DIPSTICK
Bilirubin, UA: NEGATIVE
Glucose, UA: NEGATIVE
Ketones, UA: NEGATIVE
Nitrite, UA: NEGATIVE
Protein, UA: POSITIVE — AB
Spec Grav, UA: >=1.03 — AB (ref 1.010–1.025)
Urobilinogen, UA: 0.2 E.U./dL
pH, UA: 6 (ref 5.0–8.0)

## 2018-12-15 MED ORDER — CIPROFLOXACIN HCL 250 MG PO TABS: 250.0000 mg | ORAL_TABLET | Freq: Two times a day (BID) | ORAL | 0 refills | Status: DC

## 2018-12-15 NOTE — Progress Notes (Signed)
Chief Complaint: Possible UTI  History of Present Illness: Briana Werner is a 83 y.o. female who presents to [facility]  today with concerns for Possible UTI.  Onset: 1 day ago  Quality: malaise, no significant UTI symptoms, just ow energy for past couple days but worse ovre past day   Associated Symptoms: see ROS below Context:  Previous UTI: 08/20/18 (+)EColi, pansensitive, renally dosed Cipro and diflucan were Rx by urgent care   Recurrent UTI (3 times/more annually): no  Abx in past 3 months: no  Complicated (any of the following): yes ?Diabetes ?Pregnancy ?Symptoms for seven or more days before seeking care ?Hospital acquired infection ?Renal failure ?Urinary tract obstruction ?Indwelling urethral catheter, stent, nephrostomy tube or urinary diversion ?Functional/anatomic abnormality of the urinary tract: Patient has a history of only one kidney functioning with surgical shunting from the functional kidney to the other. ?Renal transplantation ?Immunosuppression   Past medical, social and family history reviewed: Past Medical History:  Diagnosis Date  . Asthma   . Atrial flutter ([place])   . CAD (coronary artery disease)   . Cerebrovascular disease   . Chronic renal insufficiency 02/26/2014  . COPD (chronic obstructive pulmonary disease) ([place]) 02/26/2014  . Hypothyroid 02/26/2014  . Presence of stent in coronary artery in patient with coronary artery disease 02/26/2014  . Renal artery stenosis ([place])   . Type 2 diabetes mellitus ([place]) 02/26/2014   Past Surgical History:  Procedure Laterality Date  . ABDOMINAL HYSTERECTOMY    . APPENDECTOMY    . BACK SURGERY    . CHOLECYSTECTOMY    . CORONARY ARTERY BYPASS GRAFT     2004, [place]  . Hip replacement    . KNEE SURGERY Bilateral   . NASAL SINUS SURGERY    . SHOULDER SURGERY    . TONSILLECTOMY     Social History   Tobacco Use  . Smoking status: Never Smoker  . Smokeless tobacco:  Never Used  Substance Use Topics  . Alcohol use: No   The patient has a family history of  Current Outpatient Medications  Medication Sig Dispense Refill  . ADVAIR DISKUS 500-50 MCG/DOSE AEPB INHALE 1 PUFF INTO THE LUNGS TWICE DAILY 60 each 2  . albuterol (PROVENTIL HFA;VENTOLIN HFA) 108 (90 Base) MCG/ACT inhaler Inhale 2 puffs into the lungs every 6 (six) hours as needed for wheezing. 2 Inhaler 11  . apixaban (ELIQUIS) 2.5 MG TABS tablet Take 1 tablet (2.5 mg total) by mouth 2 (two) times daily. 180 tablet 3  . Calcium Carbonate-Vitamin D (CALTRATE 600+D PO) Take by mouth 2 (two) times daily.    . carvedilol (COREG) 12.5 MG tablet Take 2 tablets po in AM, 1 tablet po in PM 180 tablet 1  . cetirizine (ZYRTEC) 10 MG tablet Take 1 tablet (10 mg total) by mouth at bedtime. 90 tablet 3  . diclofenac sodium (VOLTAREN) 1 % GEL Apply 4 g topically 4 (four) times daily. To affected joint. 100 g 11  . escitalopram (LEXAPRO) 5 MG tablet TAKE 1 TABLET(5 MG) BY MOUTH DAILY 90 tablet 0  . famotidine (PEPCID) 40 MG tablet Take 1 tablet (40 mg total) by mouth daily. 90 tablet 1  . FeFum-FePoly-FA-B Cmp-C-Biot (INTEGRA PLUS) CAPS TAKE 1 CAPSULE BY MOUTH DAILY 90 capsule 0  . gabapentin (NEURONTIN) 300 MG capsule TAKE 1 CAPSULE(300 MG) BY MOUTH THREE TIMES DAILY 270 capsule 0  . glucose blood (ONE TOUCH ULTRA TEST) test strip Check blood sugar up to  4 times daily. Dx DM ICD 10 E11.9 insulin dependent. 200 each prn  . Insulin Glargine (TOUJEO SOLOSTAR) 300 UNIT/ML SOPN Inject 10 Units into the skin daily. 15 mL 5  . Insulin Pen Needle (BD PEN NEEDLE NANO U/F) 32G X 4 MM MISC Inject into skin once daily. Use with insulin pen. DX DM ICD-10 E11.22 100 each prn  . ipratropium-albuterol (DUONEB) 0.5-2.5 (3) MG/3ML SOLN Take 3 mLs by nebulization every 2 (two) hours as needed (wheeze, SOB, cough). 60 mL 1  . IRON PO Take by mouth.    Marland Kitchen JANUVIA 50 MG tablet TAKE 1 TABLET BY MOUTH EVERY DAY 90 tablet 0  .  levothyroxine (SYNTHROID, LEVOTHROID) 88 MCG tablet TAKE 1 TABLET BY MOUTH DAILY BEFORE BREAKFAST 30 tablet 1  . montelukast (SINGULAIR) 10 MG tablet TAKE 1 TABLET BY MOUTH AT BEDTIME 90 tablet 0  . Multiple Vitamin (MULTIVITAMIN) capsule Take 1 capsule by mouth.    Marland Kitchen omeprazole (PRILOSEC) 20 MG capsule TAKE 1 CAPSULE BY MOUTH TWICE DAILY 180 capsule 0  . losartan (COZAAR) 100 MG tablet Take 1 tablet (100 mg total) by mouth daily. 90 tablet 3   No current facility-administered medications for this visit.    Allergies  Allergen Reactions  . Tetracycline Shortness Of Breath  . Tetracyclines & Related Anaphylaxis and Shortness Of Breath    dizziness  . Amlodipine Other (See Comments)    Edema   . Amoxicillin Other (See Comments)    Questionable rash   . Latex Dermatitis    Bandages, pulls skin  . Other Dermatitis    Bandaids    . Sulfamethoxazole-Trimethoprim Other (See Comments)    dizzy  dizzy  . Sulfa Antibiotics Rash  . Tape Dermatitis and Rash    Bandaids     Review of Systems: CONSTITUTIONAL: Negative fever/chills, +fatigue CARDIAC: No chest pain/pressure/palpitations, no orthopnea RESPIRATORY: No cough/shortness of breath/wheeze GASTROINTESTINAL: No nausea/vomiting/abdominal pain/blood in stool/diarrhea/constipation GENITOURINARY:    Frequency: no  Hematuria: no  Odor: no  Incontinence: yes, baseline   Flank Pain: no  Vaginal bleeding/discharge: no   Exam:  BP (!) 147/89 (BP Location: Left Arm, Patient Position: Sitting, Cuff Size: Normal)   Pulse 76   Wt 149 lb 1.9 oz (67.6 kg)   LMP  (LMP Unknown)   SpO2 91%   BMI 23.36 kg/m  Constitutional: VSS, see above. General Appearance: alert, well-developed, well-nourished, NAD, lethargic Respiratory: Very minimal respiratory effort. Breath sounds no wheeze/rhonchi, slight rales at bases of lungs  Cardiovascular: S1/S2 normal, no murmur/rub/gallop auscultated. RRR. No LE edema  Gastrointestinal: Nontender, no  masses. No hepatomegaly, no splenomegaly. No hernia appreciated. Rectal exam deferred.  Musculoskeletal: Gait not examined. No clubbing/cyanosis of digits. Lloyd sign Negative bilateral    Previous Culture Results: EColi as above 08/2018      ASSESSMENT/PLAN: UA not too impressive and no significant UTI symptoms, O2 low is concerning but pt is otherwise stable and not c/o SOB.   UTI symptoms - Plan: POCT Urinalysis Dipstick, Urine Culture, Urinalysis, microscopic only, CBC with Differential/Platelet, COMPLETE METABOLIC PANEL WITH GFR, Magnesium, Phosphorus, DG Chest 2 View  Malaise - Plan: CBC with Differential/Platelet, COMPLETE METABOLIC PANEL WITH GFR, Magnesium, Phosphorus, DG Chest 2 View  Meds ordered this encounter  Medications  . ciprofloxacin (CIPRO) 250 MG tablet    Sig: Take 1 tablet (250 mg total) by mouth every 12 (twelve) hours.    Dispense:  14 tablet    Refill:  0  Dg Chest 2 View  Result Date: 12/15/2018 CLINICAL DATA:  Shortness of breath EXAM: CHEST - 2 VIEW COMPARISON:  CT 10/03/2018 FINDINGS: Prior CABG. Heart is upper limits normal in size. No confluent airspace opacities or effusions. No acute bony abnormality. IMPRESSION: No active cardiopulmonary disease. Electronically Signed   By: Rolm Baptise M.D.   On: 12/15/2018 11:19    Results for orders placed or performed in visit on 12/15/18 (from the past 24 hour(s))  POCT Urinalysis Dipstick     Status: Abnormal   Collection Time: 12/15/18 10:27 AM  Result Value Ref Range   Color, UA amber    Clarity, UA clear    Glucose, UA Negative Negative   Bilirubin, UA Negative    Ketones, UA Negative    Spec Grav, UA >=1.030 (A) 1.010 - 1.025   Blood, UA Moderate    pH, UA 6.0 5.0 - 8.0   Protein, UA Positive (A) Negative   Urobilinogen, UA 0.2 0.2 or 1.0 E.U./dL   Nitrite, UA Negative    Leukocytes, UA Small (1+) (A) Negative   Appearance     Odor       Patient advised we will call with urine culture  results once available, depending on results may need to change therapy. Return if symptoms worsen or fail to improve.

## 2018-12-16 LAB — COMPLETE METABOLIC PANEL WITH GFR
AG Ratio: 0.9 (calc) — ABNORMAL LOW (ref 1.0–2.5)
ALT: 13 U/L (ref 6–29)
AST: 33 U/L (ref 10–35)
Albumin: 4.1 g/dL (ref 3.6–5.1)
Alkaline phosphatase (APISO): 90 U/L (ref 37–153)
BUN/Creatinine Ratio: 27 (calc) — ABNORMAL HIGH (ref 6–22)
BUN: 45 mg/dL — ABNORMAL HIGH (ref 7–25)
CO2: 23 mmol/L (ref 20–32)
Calcium: 10.5 mg/dL — ABNORMAL HIGH (ref 8.6–10.4)
Chloride: 101 mmol/L (ref 98–110)
Creat: 1.69 mg/dL — ABNORMAL HIGH (ref 0.60–0.88)
GFR, Est African American: 33 mL/min/{1.73_m2} — ABNORMAL LOW (ref >=60)
GFR, Est Non African American: 28 mL/min/{1.73_m2} — ABNORMAL LOW (ref >=60)
Globulin: 4.6 g/dL (calc) — ABNORMAL HIGH (ref 1.9–3.7)
Glucose, Bld: 229 mg/dL — ABNORMAL HIGH (ref 65–99)
Potassium: 4.4 mmol/L (ref 3.5–5.3)
Sodium: 135 mmol/L (ref 135–146)
Total Bilirubin: 0.8 mg/dL (ref 0.2–1.2)
Total Protein: 8.7 g/dL — ABNORMAL HIGH (ref 6.1–8.1)

## 2018-12-16 LAB — URINE CULTURE
MICRO NUMBER:: 374056
Result:: NO GROWTH
SPECIMEN QUALITY:: ADEQUATE

## 2018-12-16 LAB — URINALYSIS, MICROSCOPIC ONLY
Bacteria, UA: NONE SEEN /HPF
Hyaline Cast: NONE SEEN /LPF
WBC, UA: >=60 /HPF — AB (ref 0–5)

## 2018-12-16 LAB — CBC WITH DIFFERENTIAL/PLATELET
Absolute Monocytes: 665 cells/uL (ref 200–950)
Basophils Absolute: 67 cells/uL (ref 0–200)
Basophils Relative: 0.7 %
Eosinophils Absolute: 1549 cells/uL — ABNORMAL HIGH (ref 15–500)
Eosinophils Relative: 16.3 %
HCT: 42.1 % (ref 35.0–45.0)
Hemoglobin: 14.7 g/dL (ref 11.7–15.5)
Lymphs Abs: 1976 cells/uL (ref 850–3900)
MCH: 31.4 pg (ref 27.0–33.0)
MCHC: 34.9 g/dL (ref 32.0–36.0)
MCV: 90 fL (ref 80.0–100.0)
MPV: 10.6 fL (ref 7.5–12.5)
Monocytes Relative: 7 %
Neutro Abs: 5244 cells/uL (ref 1500–7800)
Neutrophils Relative %: 55.2 %
Platelets: 258 10*3/uL (ref 140–400)
RBC: 4.68 10*6/uL (ref 3.80–5.10)
RDW: 13.1 % (ref 11.0–15.0)
Total Lymphocyte: 20.8 %
WBC: 9.5 10*3/uL (ref 3.8–10.8)

## 2018-12-16 LAB — PHOSPHORUS: Phosphorus: 3.3 mg/dL (ref 2.1–4.3)

## 2018-12-16 LAB — MAGNESIUM: Magnesium: 1.4 mg/dL — ABNORMAL LOW (ref 1.5–2.5)

## 2018-12-19 ENCOUNTER — Telehealth: Payer: Self-pay

## 2018-12-19 NOTE — Telephone Encounter (Signed)
Contacted pt regarding lab results. Pt mentioned during the call that she has been having severe headaches since last seeing provider. Currently taking 2 aspirins along with daily medications. Unsure if she mentioned it at her last visit with provider. Requesting recommendations. Pls advise, thanks.

## 2018-12-19 NOTE — Telephone Encounter (Signed)
Spoke to pt, she has been keeping herself hydrated. She does have a bp machine at home. However pt has limited movement on her own. She is going to have her daughter Velna Hatchet) check her blood pressure later on this evening.I've asked pt to recheck her blood pressure tomorrow as well before calling us. She will call us a call with BP readings tomorrow morning.

## 2018-12-19 NOTE — Telephone Encounter (Signed)
Did not mention headache to me at that time Without more info couldn't say for sure - would be sure she is hydrating well, would need office visit to fully evaluate.  BP was a bit high at last few checks in office, can she check BP at home?      BP Readings from Last 3 Encounters:  12/15/18 (!) 147/89  11/16/18 (!) 173/64  11/07/18 (!) 173/76

## 2018-12-20 ENCOUNTER — Other Ambulatory Visit: Payer: Self-pay | Admitting: Osteopathic Medicine

## 2018-12-20 MED ORDER — CARVEDILOL 25 MG PO TABS: 25.0000 mg | ORAL_TABLET | Freq: Two times a day (BID) | ORAL | 0 refills | Status: AC

## 2018-12-20 NOTE — Telephone Encounter (Signed)
Left a detailed vm msg for pt and pt's daughter Velna Hatchet) regarding increase in bp med. Aware of new rx sent to pharmacy & provider's note. Direct call back info provided.

## 2018-12-20 NOTE — Telephone Encounter (Signed)
Elevated BP may be contributing to headaches, can increase Carvedilol to 25 mg twice daily, I sent Rx to local pharmacy on file if needed. Continue to check BP couple times per day. If over 140 top and/or 90 bottom, wait 5 mins and recheck.

## 2018-12-20 NOTE — Telephone Encounter (Signed)
Pt called with her blood pressure readings. On 12/19/18, reading was 173/76 for the evening. For this morning, reading was 168/78. Does provider want pt's daughter to continue to monitor blood pressure? Pt is requesting recommendations from provider. Thanks.

## 2018-12-21 ENCOUNTER — Other Ambulatory Visit: Payer: Self-pay | Admitting: Osteopathic Medicine

## 2019-01-03 ENCOUNTER — Other Ambulatory Visit: Payer: Self-pay | Admitting: Osteopathic Medicine

## 2019-01-03 NOTE — Telephone Encounter (Signed)
Please adivse

## 2019-01-24 ENCOUNTER — Other Ambulatory Visit: Payer: Self-pay | Admitting: Osteopathic Medicine

## 2019-01-27 ENCOUNTER — Other Ambulatory Visit: Payer: Self-pay | Admitting: Osteopathic Medicine

## 2019-01-29 NOTE — Telephone Encounter (Signed)
OK to refill but she needs blood work done, orders are in for TSH, can go to lab anytime (if not comfortable doing so right now, would recommend she does need to get blood drawn within next 6 weeks or so)

## 2019-01-29 NOTE — Telephone Encounter (Signed)
Left a detailed vm msg for pt regarding med refill and provider's note. Call back info was provided.

## 2019-01-29 NOTE — Telephone Encounter (Signed)
[facility] requesting med refill for levothyroxine. Last thyroid check was 09/28/17, results were abnl. Pls advise, thanks.

## 2019-02-02 ENCOUNTER — Ambulatory Visit: Payer: Medicare Other | Admitting: Sports Medicine

## 2019-02-05 ENCOUNTER — Other Ambulatory Visit: Payer: Self-pay | Admitting: Osteopathic Medicine

## 2019-02-06 ENCOUNTER — Ambulatory Visit: Payer: Medicare Other | Admitting: Sports Medicine

## 2019-02-07 ENCOUNTER — Encounter: Payer: Self-pay | Admitting: Osteopathic Medicine

## 2019-02-07 ENCOUNTER — Ambulatory Visit (INDEPENDENT_AMBULATORY_CARE_PROVIDER_SITE_OTHER): Payer: Medicare Other | Admitting: Osteopathic Medicine

## 2019-02-07 VITALS — Wt 151.4 lb

## 2019-02-07 DIAGNOSIS — N390 Urinary tract infection, site not specified: Secondary | ICD-10-CM

## 2019-02-07 MED ORDER — CIPROFLOXACIN HCL 250 MG PO TABS: 250.0000 mg | ORAL_TABLET | Freq: Two times a day (BID) | ORAL | 0 refills | Status: AC

## 2019-02-07 NOTE — Progress Notes (Signed)
Virtual Visit via Phone Note  I connected with      Briana Werner on 02/07/19 at 10:50 by a telemedicine application and verified that I am speaking with the correct person using two identifiers.  Patient is at home I am working from home    I discussed the limitations of evaluation and management by telemedicine and the availability of in person appointments. The patient expressed understanding and agreed to proceed.  History of Present Illness: Briana Werner is a 83 y.o. female who would like to discuss  Chief Complaint  Patient presents with  . Urinary Tract Infection    Reports urinary frequency, malodorous urine, dysuria. Reports some mental confusion over the weekend, was with her kids at lunchtime and her hands were shaking. Reports no tremors/shaking yesterday, reports significant fatigue. No fever. No feeling dizzy/faint. No nausea. Reports some pain in abdomen around belly button, reports no tenderness when she pushes on that area.        Observations/Objective: Wt 151 lb 6.4 oz (68.7 kg)   LMP  (LMP Unknown)   BMI 23.71 kg/m  BP Readings from Last 3 Encounters:  12/15/18 (!) 147/89  11/16/18 (!) 173/64  11/07/18 (!) 173/76   Exam: Normal Speech.    Lab and Radiology Results No results found for this or any previous visit (from the past 72 hour(s)). No results found.     Assessment and Plan: 83 y.o. female with The encounter diagnosis was Complicated UTI (urinary tract infection).  Discussed with patient that ideally I would like her seen in the office so that we can collect a urine specimen and obtain vital signs.  Patient is very nervous to come in given the coronavirus pandemic.  Patient was counseled that we may be missing something serious or life-threatening such as sepsis or compromised kidney function.  I advised patient that I will send antibiotic into the pharmacy for her but if she is worse in any way, she will need to seek urgent medical  attention in an emergency room.  Patient states that she will call her daughter to have her pick up the prescription and will inform her of the plan.   PDMP not reviewed this encounter. No orders of the defined types were placed in this encounter.  Meds ordered this encounter  Medications  . ciprofloxacin (CIPRO) 250 MG tablet    Sig: Take 1 tablet (250 mg total) by mouth every 12 (twelve) hours.    Dispense:  14 tablet    Refill:  0    Follow Up Instructions: Return in about 4 weeks (around 03/07/2019), or sooner if symptoms worsen or fail to improve, for A1C and BP check .    I discussed the assessment and treatment plan with the patient. The patient was provided an opportunity to ask questions and all were answered. The patient agreed with the plan and demonstrated an understanding of the instructions.   The patient was advised to call back or seek an in-person evaluation if any new concerns, if symptoms worsen or if the condition fails to improve as anticipated.  25 minutes of non-face-to-face time was provided during this encounter.                      Historical information moved to improve visibility of documentation.  Past Medical History:  Diagnosis Date  . Asthma   . Atrial flutter ([place])   . CAD (coronary artery disease)   . Cerebrovascular disease   .  Chronic renal insufficiency 02/26/2014  . COPD (chronic obstructive pulmonary disease) ([place]) 02/26/2014  . Hypothyroid 02/26/2014  . Presence of stent in coronary artery in patient with coronary artery disease 02/26/2014  . Renal artery stenosis ([place])   . Type 2 diabetes mellitus ([place]) 02/26/2014   Past Surgical History:  Procedure Laterality Date  . ABDOMINAL HYSTERECTOMY    . APPENDECTOMY    . BACK SURGERY    . CHOLECYSTECTOMY    . CORONARY ARTERY BYPASS GRAFT     2004, [place]  . Hip replacement    . KNEE SURGERY Bilateral   . NASAL SINUS SURGERY    . SHOULDER SURGERY    . TONSILLECTOMY      Social History   Tobacco Use  . Smoking status: Never Smoker  . Smokeless tobacco: Never Used  Substance Use Topics  . Alcohol use: No   family history includes CAD in her father; Heart attack in her father.  Medications: Current Outpatient Medications  Medication Sig Dispense Refill  . ADVAIR DISKUS 500-50 MCG/DOSE AEPB INHALE 1 PUFF INTO THE LUNGS TWICE DAILY 60 each 2  . albuterol (PROVENTIL HFA;VENTOLIN HFA) 108 (90 Base) MCG/ACT inhaler Inhale 2 puffs into the lungs every 6 (six) hours as needed for wheezing. 2 Inhaler 11  . apixaban (ELIQUIS) 2.5 MG TABS tablet Take 1 tablet (2.5 mg total) by mouth 2 (two) times daily. 180 tablet 3  . Calcium Carbonate-Vitamin D (CALTRATE 600+D PO) Take by mouth 2 (two) times daily.    . carvedilol (COREG) 12.5 MG tablet TAKE 1 TABLET(12.5 MG) BY MOUTH TWICE DAILY WITH A MEAL 180 tablet 1  . carvedilol (COREG) 25 MG tablet Take 1 tablet (25 mg total) by mouth 2 (two) times daily with a meal. Cancel 12.5 mg dose thanks 180 tablet 0  . cetirizine (ZYRTEC) 10 MG tablet Take 1 tablet (10 mg total) by mouth at bedtime. 90 tablet 3  . ciprofloxacin (CIPRO) 250 MG tablet Take 1 tablet (250 mg total) by mouth every 12 (twelve) hours. 14 tablet 0  . diclofenac sodium (VOLTAREN) 1 % GEL Apply 4 g topically 4 (four) times daily. To affected joint. 100 g 11  . escitalopram (LEXAPRO) 5 MG tablet TAKE 1 TABLET(5 MG) BY MOUTH DAILY 90 tablet 0  . famotidine (PEPCID) 40 MG tablet Take 1 tablet (40 mg total) by mouth daily. 90 tablet 1  . FeFum-FePoly-FA-B Cmp-C-Biot (INTEGRA PLUS) CAPS TAKE 1 CAPSULE BY MOUTH DAILY 90 capsule 0  . gabapentin (NEURONTIN) 300 MG capsule TAKE 1 CAPSULE(300 MG) BY MOUTH THREE TIMES DAILY 270 capsule 0  . glucose blood (ONE TOUCH ULTRA TEST) test strip Check blood sugar up to 4 times daily. Dx DM ICD 10 E11.9 insulin dependent. 200 each prn  . Insulin Glargine (TOUJEO SOLOSTAR) 300 UNIT/ML SOPN Inject 10 Units into the skin daily. 15  mL 5  . Insulin Pen Needle (BD PEN NEEDLE NANO U/F) 32G X 4 MM MISC Inject into skin once daily. Use with insulin pen. DX DM ICD-10 E11.22 100 each prn  . ipratropium-albuterol (DUONEB) 0.5-2.5 (3) MG/3ML SOLN Take 3 mLs by nebulization every 2 (two) hours as needed (wheeze, SOB, cough). 60 mL 1  . IRON PO Take by mouth.    Marland Kitchen JANUVIA 50 MG tablet TAKE 1 TABLET BY MOUTH EVERY DAY 90 tablet 0  . levothyroxine (SYNTHROID) 88 MCG tablet TAKE 1 TABLET(88 MCG) BY MOUTH DAILY BEFORE BREAKFAST 90 tablet 0  . montelukast (SINGULAIR) 10 MG  tablet TAKE 1 TABLET BY MOUTH AT BEDTIME 90 tablet 0  . Multiple Vitamin (MULTIVITAMIN) capsule Take 1 capsule by mouth.    Marland Kitchen omeprazole (PRILOSEC) 20 MG capsule TAKE 1 CAPSULE BY MOUTH TWICE DAILY 180 capsule 0  . losartan (COZAAR) 100 MG tablet Take 1 tablet (100 mg total) by mouth daily. 90 tablet 3   No current facility-administered medications for this visit.    Allergies  Allergen Reactions  . Tetracycline Shortness Of Breath  . Tetracyclines & Related Anaphylaxis and Shortness Of Breath    dizziness  . Amlodipine Other (See Comments)    Edema   . Amoxicillin Other (See Comments)    Questionable rash   . Latex Dermatitis    Bandages, pulls skin  . Other Dermatitis    Bandaids    . Sulfamethoxazole-Trimethoprim Other (See Comments)    dizzy  dizzy  . Sulfa Antibiotics Rash  . Tape Dermatitis and Rash    Bandaids    PDMP not reviewed this encounter. No orders of the defined types were placed in this encounter.  Meds ordered this encounter  Medications  . ciprofloxacin (CIPRO) 250 MG tablet    Sig: Take 1 tablet (250 mg total) by mouth every 12 (twelve) hours.    Dispense:  14 tablet    Refill:  0

## 2019-03-02 ENCOUNTER — Other Ambulatory Visit: Payer: Self-pay | Admitting: Osteopathic Medicine

## 2019-03-02 NOTE — Telephone Encounter (Signed)
Please advise 

## 2019-03-12 ENCOUNTER — Other Ambulatory Visit: Payer: Self-pay | Admitting: Osteopathic Medicine

## 2019-03-23 ENCOUNTER — Ambulatory Visit (HOSPITAL_COMMUNITY): Admission: RE | Admit: 2019-03-23 | Payer: Medicare Other | Source: Ambulatory Visit

## 2019-03-23 ENCOUNTER — Other Ambulatory Visit: Payer: Self-pay | Admitting: Osteopathic Medicine

## 2019-04-05 ENCOUNTER — Other Ambulatory Visit: Payer: Self-pay | Admitting: Osteopathic Medicine

## 2019-04-15 DIAGNOSIS — Z7901 Long term (current) use of anticoagulants: Secondary | ICD-10-CM | POA: Diagnosis not present

## 2019-04-15 DIAGNOSIS — R9089 Other abnormal findings on diagnostic imaging of central nervous system: Secondary | ICD-10-CM | POA: Diagnosis not present

## 2019-04-15 DIAGNOSIS — G9341 Metabolic encephalopathy: Secondary | ICD-10-CM | POA: Diagnosis not present

## 2019-04-15 DIAGNOSIS — I4892 Unspecified atrial flutter: Secondary | ICD-10-CM | POA: Diagnosis not present

## 2019-04-15 DIAGNOSIS — N183 Chronic kidney disease, stage 3 (moderate): Secondary | ICD-10-CM | POA: Diagnosis not present

## 2019-04-15 DIAGNOSIS — R410 Disorientation, unspecified: Secondary | ICD-10-CM | POA: Diagnosis not present

## 2019-04-15 DIAGNOSIS — N3000 Acute cystitis without hematuria: Secondary | ICD-10-CM | POA: Diagnosis present

## 2019-04-15 DIAGNOSIS — I5042 Chronic combined systolic (congestive) and diastolic (congestive) heart failure: Secondary | ICD-10-CM | POA: Diagnosis not present

## 2019-04-15 DIAGNOSIS — N390 Urinary tract infection, site not specified: Secondary | ICD-10-CM | POA: Diagnosis not present

## 2019-04-15 DIAGNOSIS — G301 Alzheimer's disease with late onset: Secondary | ICD-10-CM | POA: Diagnosis not present

## 2019-04-15 DIAGNOSIS — G319 Degenerative disease of nervous system, unspecified: Secondary | ICD-10-CM | POA: Diagnosis not present

## 2019-04-15 DIAGNOSIS — F419 Anxiety disorder, unspecified: Secondary | ICD-10-CM | POA: Diagnosis present

## 2019-04-15 DIAGNOSIS — E039 Hypothyroidism, unspecified: Secondary | ICD-10-CM | POA: Diagnosis present

## 2019-04-15 DIAGNOSIS — B9620 Unspecified Escherichia coli [E. coli] as the cause of diseases classified elsewhere: Secondary | ICD-10-CM | POA: Diagnosis present

## 2019-04-15 DIAGNOSIS — F0280 Dementia in other diseases classified elsewhere without behavioral disturbance: Secondary | ICD-10-CM | POA: Diagnosis not present

## 2019-04-15 DIAGNOSIS — I6782 Cerebral ischemia: Secondary | ICD-10-CM | POA: Diagnosis not present

## 2019-04-15 DIAGNOSIS — I5032 Chronic diastolic (congestive) heart failure: Secondary | ICD-10-CM | POA: Diagnosis not present

## 2019-04-15 DIAGNOSIS — R296 Repeated falls: Secondary | ICD-10-CM | POA: Diagnosis present

## 2019-04-15 DIAGNOSIS — R4182 Altered mental status, unspecified: Secondary | ICD-10-CM | POA: Diagnosis not present

## 2019-04-15 DIAGNOSIS — N281 Cyst of kidney, acquired: Secondary | ICD-10-CM | POA: Diagnosis not present

## 2019-04-15 DIAGNOSIS — E1122 Type 2 diabetes mellitus with diabetic chronic kidney disease: Secondary | ICD-10-CM | POA: Diagnosis not present

## 2019-04-15 DIAGNOSIS — R079 Chest pain, unspecified: Secondary | ICD-10-CM | POA: Diagnosis not present

## 2019-04-15 DIAGNOSIS — D72829 Elevated white blood cell count, unspecified: Secondary | ICD-10-CM | POA: Diagnosis not present

## 2019-04-15 DIAGNOSIS — I482 Chronic atrial fibrillation, unspecified: Secondary | ICD-10-CM | POA: Diagnosis not present

## 2019-04-15 DIAGNOSIS — N179 Acute kidney failure, unspecified: Secondary | ICD-10-CM | POA: Diagnosis not present

## 2019-04-15 DIAGNOSIS — G9340 Encephalopathy, unspecified: Secondary | ICD-10-CM | POA: Diagnosis not present

## 2019-04-15 DIAGNOSIS — G4733 Obstructive sleep apnea (adult) (pediatric): Secondary | ICD-10-CM | POA: Diagnosis not present

## 2019-04-15 DIAGNOSIS — E782 Mixed hyperlipidemia: Secondary | ICD-10-CM | POA: Diagnosis present

## 2019-04-15 DIAGNOSIS — Z794 Long term (current) use of insulin: Secondary | ICD-10-CM | POA: Diagnosis not present

## 2019-04-15 DIAGNOSIS — J441 Chronic obstructive pulmonary disease with (acute) exacerbation: Secondary | ICD-10-CM | POA: Diagnosis not present

## 2019-04-15 DIAGNOSIS — I509 Heart failure, unspecified: Secondary | ICD-10-CM | POA: Diagnosis not present

## 2019-04-15 DIAGNOSIS — N2889 Other specified disorders of kidney and ureter: Secondary | ICD-10-CM | POA: Diagnosis not present

## 2019-04-15 DIAGNOSIS — I10 Essential (primary) hypertension: Secondary | ICD-10-CM | POA: Diagnosis not present

## 2019-04-15 DIAGNOSIS — I2510 Atherosclerotic heart disease of native coronary artery without angina pectoris: Secondary | ICD-10-CM | POA: Diagnosis present

## 2019-04-15 DIAGNOSIS — N3289 Other specified disorders of bladder: Secondary | ICD-10-CM | POA: Diagnosis not present

## 2019-04-15 DIAGNOSIS — Z515 Encounter for palliative care: Secondary | ICD-10-CM | POA: Diagnosis not present

## 2019-04-15 DIAGNOSIS — I130 Hypertensive heart and chronic kidney disease with heart failure and stage 1 through stage 4 chronic kidney disease, or unspecified chronic kidney disease: Secondary | ICD-10-CM | POA: Diagnosis not present

## 2019-04-15 DIAGNOSIS — Z20828 Contact with and (suspected) exposure to other viral communicable diseases: Secondary | ICD-10-CM | POA: Diagnosis not present

## 2019-04-15 DIAGNOSIS — R918 Other nonspecific abnormal finding of lung field: Secondary | ICD-10-CM | POA: Diagnosis not present

## 2019-04-15 DIAGNOSIS — E119 Type 2 diabetes mellitus without complications: Secondary | ICD-10-CM | POA: Diagnosis not present

## 2019-04-15 DIAGNOSIS — E1142 Type 2 diabetes mellitus with diabetic polyneuropathy: Secondary | ICD-10-CM | POA: Diagnosis present

## 2019-04-15 DIAGNOSIS — R531 Weakness: Secondary | ICD-10-CM | POA: Diagnosis not present

## 2019-04-15 DIAGNOSIS — F329 Major depressive disorder, single episode, unspecified: Secondary | ICD-10-CM | POA: Diagnosis present

## 2019-04-15 DIAGNOSIS — R0602 Shortness of breath: Secondary | ICD-10-CM | POA: Diagnosis not present

## 2019-04-20 MED ORDER — IBUPROFEN 600 MG PO TABS
600.00 | ORAL_TABLET | ORAL | Status: DC
Start: ? — End: 2019-04-20

## 2019-04-20 MED ORDER — GENERIC EXTERNAL MEDICATION
Status: DC
Start: ? — End: 2019-04-20

## 2019-04-20 MED ORDER — BUDESONIDE-FORMOTEROL FUMARATE 160-4.5 MCG/ACT IN AERO
2.00 | INHALATION_SPRAY | RESPIRATORY_TRACT | Status: DC
Start: 2019-04-19 — End: 2019-04-20

## 2019-04-20 MED ORDER — INSULIN GLARGINE 100 UNIT/ML ~~LOC~~ SOLN
15.00 | SUBCUTANEOUS | Status: DC
Start: 2019-04-19 — End: 2019-04-20

## 2019-04-20 MED ORDER — NITROGLYCERIN 0.4 MG SL SUBL
0.40 | SUBLINGUAL_TABLET | SUBLINGUAL | Status: DC
Start: ? — End: 2019-04-20

## 2019-04-20 MED ORDER — ATORVASTATIN CALCIUM 40 MG PO TABS
40.00 | ORAL_TABLET | ORAL | Status: DC
Start: 2019-04-19 — End: 2019-04-20

## 2019-04-20 MED ORDER — PANTOPRAZOLE SODIUM 40 MG PO TBEC
40.00 | DELAYED_RELEASE_TABLET | ORAL | Status: DC
Start: 2019-04-20 — End: 2019-04-20

## 2019-04-20 MED ORDER — SODIUM CHLORIDE 0.9 % IV SOLN
75.00 | INTRAVENOUS | Status: DC
Start: ? — End: 2019-04-20

## 2019-04-20 MED ORDER — CARVEDILOL 12.5 MG PO TABS
12.50 | ORAL_TABLET | ORAL | Status: DC
Start: 2019-04-19 — End: 2019-04-20

## 2019-04-20 MED ORDER — ACETAMINOPHEN 325 MG PO TABS
650.00 | ORAL_TABLET | ORAL | Status: DC
Start: ? — End: 2019-04-20

## 2019-04-20 MED ORDER — GABAPENTIN 100 MG PO CAPS
200.00 | ORAL_CAPSULE | ORAL | Status: DC
Start: 2019-04-19 — End: 2019-04-20

## 2019-04-20 MED ORDER — ESCITALOPRAM OXALATE 10 MG PO TABS
5.00 | ORAL_TABLET | ORAL | Status: DC
Start: 2019-04-20 — End: 2019-04-20

## 2019-04-20 MED ORDER — HYDRALAZINE HCL 20 MG/ML IJ SOLN
10.00 | INTRAMUSCULAR | Status: DC
Start: ? — End: 2019-04-20

## 2019-04-20 MED ORDER — MONTELUKAST SODIUM 10 MG PO TABS
10.00 | ORAL_TABLET | ORAL | Status: DC
Start: 2019-04-19 — End: 2019-04-20

## 2019-04-20 MED ORDER — SODIUM CHLORIDE 0.9 % IV SOLN
10.00 | INTRAVENOUS | Status: DC
Start: ? — End: 2019-04-20

## 2019-04-20 MED ORDER — THERA PO TABS
1.00 | ORAL_TABLET | ORAL | Status: DC
Start: 2019-04-20 — End: 2019-04-20

## 2019-04-20 MED ORDER — GENERIC EXTERNAL MEDICATION
1.00 | Status: DC
Start: 2019-04-20 — End: 2019-04-20

## 2019-04-20 MED ORDER — IPRATROPIUM-ALBUTEROL 0.5-2.5 (3) MG/3ML IN SOLN
3.00 | RESPIRATORY_TRACT | Status: DC
Start: ? — End: 2019-04-20

## 2019-04-20 MED ORDER — APIXABAN 2.5 MG PO TABS
2.50 | ORAL_TABLET | ORAL | Status: DC
Start: 2019-04-19 — End: 2019-04-20

## 2019-04-20 MED ORDER — LORATADINE 10 MG PO TABS
10.00 | ORAL_TABLET | ORAL | Status: DC
Start: 2019-04-20 — End: 2019-04-20

## 2019-04-20 MED ORDER — METHYLPREDNISOLONE SODIUM SUCC 40 MG IJ SOLR
40.00 | INTRAMUSCULAR | Status: DC
Start: 2019-04-20 — End: 2019-04-20

## 2019-04-20 MED ORDER — ACETAMINOPHEN 650 MG RE SUPP
650.00 | RECTAL | Status: DC
Start: ? — End: 2019-04-20

## 2019-04-20 MED ORDER — LEVOTHYROXINE SODIUM 88 MCG PO TABS
88.00 | ORAL_TABLET | ORAL | Status: DC
Start: 2019-04-20 — End: 2019-04-20

## 2019-05-03 DIAGNOSIS — I081 Rheumatic disorders of both mitral and tricuspid valves: Secondary | ICD-10-CM | POA: Diagnosis not present

## 2019-05-03 DIAGNOSIS — E785 Hyperlipidemia, unspecified: Secondary | ICD-10-CM | POA: Diagnosis present

## 2019-05-03 DIAGNOSIS — R079 Chest pain, unspecified: Secondary | ICD-10-CM | POA: Diagnosis not present

## 2019-05-03 DIAGNOSIS — I5031 Acute diastolic (congestive) heart failure: Secondary | ICD-10-CM | POA: Diagnosis not present

## 2019-05-03 DIAGNOSIS — Z881 Allergy status to other antibiotic agents status: Secondary | ICD-10-CM | POA: Diagnosis not present

## 2019-05-03 DIAGNOSIS — E039 Hypothyroidism, unspecified: Secondary | ICD-10-CM | POA: Diagnosis present

## 2019-05-03 DIAGNOSIS — Z20828 Contact with and (suspected) exposure to other viral communicable diseases: Secondary | ICD-10-CM | POA: Diagnosis not present

## 2019-05-03 DIAGNOSIS — I5033 Acute on chronic diastolic (congestive) heart failure: Secondary | ICD-10-CM | POA: Diagnosis not present

## 2019-05-03 DIAGNOSIS — Z794 Long term (current) use of insulin: Secondary | ICD-10-CM | POA: Diagnosis not present

## 2019-05-03 DIAGNOSIS — N183 Chronic kidney disease, stage 3 (moderate): Secondary | ICD-10-CM | POA: Diagnosis not present

## 2019-05-03 DIAGNOSIS — I739 Peripheral vascular disease, unspecified: Secondary | ICD-10-CM | POA: Diagnosis present

## 2019-05-03 DIAGNOSIS — I509 Heart failure, unspecified: Secondary | ICD-10-CM | POA: Diagnosis not present

## 2019-05-03 DIAGNOSIS — I130 Hypertensive heart and chronic kidney disease with heart failure and stage 1 through stage 4 chronic kidney disease, or unspecified chronic kidney disease: Secondary | ICD-10-CM | POA: Diagnosis not present

## 2019-05-03 DIAGNOSIS — I2510 Atherosclerotic heart disease of native coronary artery without angina pectoris: Secondary | ICD-10-CM | POA: Diagnosis not present

## 2019-05-03 DIAGNOSIS — I161 Hypertensive emergency: Secondary | ICD-10-CM | POA: Diagnosis not present

## 2019-05-03 DIAGNOSIS — R0789 Other chest pain: Secondary | ICD-10-CM | POA: Diagnosis not present

## 2019-05-03 DIAGNOSIS — Z951 Presence of aortocoronary bypass graft: Secondary | ICD-10-CM | POA: Diagnosis not present

## 2019-05-03 DIAGNOSIS — Z7901 Long term (current) use of anticoagulants: Secondary | ICD-10-CM | POA: Diagnosis not present

## 2019-05-03 DIAGNOSIS — E1122 Type 2 diabetes mellitus with diabetic chronic kidney disease: Secondary | ICD-10-CM | POA: Diagnosis not present

## 2019-05-03 DIAGNOSIS — D631 Anemia in chronic kidney disease: Secondary | ICD-10-CM | POA: Diagnosis present

## 2019-05-03 DIAGNOSIS — R748 Abnormal levels of other serum enzymes: Secondary | ICD-10-CM | POA: Diagnosis present

## 2019-05-03 DIAGNOSIS — J449 Chronic obstructive pulmonary disease, unspecified: Secondary | ICD-10-CM | POA: Diagnosis present

## 2019-05-03 DIAGNOSIS — I482 Chronic atrial fibrillation, unspecified: Secondary | ICD-10-CM | POA: Diagnosis not present

## 2019-05-03 DIAGNOSIS — I25118 Atherosclerotic heart disease of native coronary artery with other forms of angina pectoris: Secondary | ICD-10-CM | POA: Diagnosis not present

## 2019-05-03 DIAGNOSIS — Z66 Do not resuscitate: Secondary | ICD-10-CM | POA: Diagnosis present

## 2019-05-03 DIAGNOSIS — I259 Chronic ischemic heart disease, unspecified: Secondary | ICD-10-CM | POA: Diagnosis not present

## 2019-05-04 DIAGNOSIS — I2510 Atherosclerotic heart disease of native coronary artery without angina pectoris: Secondary | ICD-10-CM | POA: Diagnosis not present

## 2019-05-04 DIAGNOSIS — Z951 Presence of aortocoronary bypass graft: Secondary | ICD-10-CM | POA: Diagnosis not present

## 2019-05-13 DIAGNOSIS — I130 Hypertensive heart and chronic kidney disease with heart failure and stage 1 through stage 4 chronic kidney disease, or unspecified chronic kidney disease: Secondary | ICD-10-CM | POA: Diagnosis not present

## 2019-05-13 DIAGNOSIS — I129 Hypertensive chronic kidney disease with stage 1 through stage 4 chronic kidney disease, or unspecified chronic kidney disease: Secondary | ICD-10-CM | POA: Diagnosis not present

## 2019-05-13 DIAGNOSIS — R51 Headache: Secondary | ICD-10-CM | POA: Diagnosis not present

## 2019-05-13 DIAGNOSIS — E039 Hypothyroidism, unspecified: Secondary | ICD-10-CM | POA: Diagnosis present

## 2019-05-13 DIAGNOSIS — E876 Hypokalemia: Secondary | ICD-10-CM | POA: Diagnosis not present

## 2019-05-13 DIAGNOSIS — I482 Chronic atrial fibrillation, unspecified: Secondary | ICD-10-CM | POA: Diagnosis not present

## 2019-05-13 DIAGNOSIS — I4891 Unspecified atrial fibrillation: Secondary | ICD-10-CM | POA: Diagnosis present

## 2019-05-13 DIAGNOSIS — Z20828 Contact with and (suspected) exposure to other viral communicable diseases: Secondary | ICD-10-CM | POA: Diagnosis not present

## 2019-05-13 DIAGNOSIS — J449 Chronic obstructive pulmonary disease, unspecified: Secondary | ICD-10-CM | POA: Diagnosis present

## 2019-05-13 DIAGNOSIS — N183 Chronic kidney disease, stage 3 (moderate): Secondary | ICD-10-CM | POA: Diagnosis not present

## 2019-05-13 DIAGNOSIS — R4182 Altered mental status, unspecified: Secondary | ICD-10-CM | POA: Diagnosis not present

## 2019-05-13 DIAGNOSIS — I2510 Atherosclerotic heart disease of native coronary artery without angina pectoris: Secondary | ICD-10-CM | POA: Diagnosis present

## 2019-05-13 DIAGNOSIS — N179 Acute kidney failure, unspecified: Secondary | ICD-10-CM | POA: Diagnosis not present

## 2019-05-13 DIAGNOSIS — N184 Chronic kidney disease, stage 4 (severe): Secondary | ICD-10-CM | POA: Diagnosis not present

## 2019-05-13 DIAGNOSIS — Z7901 Long term (current) use of anticoagulants: Secondary | ICD-10-CM | POA: Diagnosis not present

## 2019-05-13 DIAGNOSIS — E871 Hypo-osmolality and hyponatremia: Secondary | ICD-10-CM | POA: Diagnosis not present

## 2019-05-13 DIAGNOSIS — I5033 Acute on chronic diastolic (congestive) heart failure: Secondary | ICD-10-CM | POA: Diagnosis not present

## 2019-05-13 DIAGNOSIS — J9601 Acute respiratory failure with hypoxia: Secondary | ICD-10-CM | POA: Diagnosis not present

## 2019-05-17 ENCOUNTER — Telehealth: Payer: Self-pay

## 2019-05-17 NOTE — Telephone Encounter (Signed)
Brandi called requesting a new rx for Toujeo. As per Velna Hatchet, pt is currently located in [place] and is under the care of a provider. The provider increased pt's toujeo injection from 18 mg to 25 mg. Pt is due to run out of the rx. Velna Hatchet is requesting an updated rx to be sent to [facility] in [place]. Pls advise, thanks.

## 2019-05-18 MED ORDER — TOUJEO MAX SOLOSTAR 300 UNIT/ML ~~LOC~~ SOPN: 25.0000 [IU] | PEN_INJECTOR | Freq: Every day | SUBCUTANEOUS | 0 refills | Status: AC

## 2019-05-18 NOTE — Telephone Encounter (Signed)
If I understand correctly, this patient has a new doctor? If she has a different prescriber managing her medicine, she should contact that doctor in the future regarding refills. I will fill Rx for 2 pens, but without office visit to follow up on recent hospitalization I will NOT authorize more refills on ANY medication. Please make sure Briana Werner understands this plan.

## 2019-05-18 NOTE — Telephone Encounter (Signed)
Left Briana Werner msg of providers note and need for appt before any other medications can be filled

## 2019-05-19 DIAGNOSIS — I509 Heart failure, unspecified: Secondary | ICD-10-CM | POA: Diagnosis not present

## 2019-05-19 DIAGNOSIS — R0902 Hypoxemia: Secondary | ICD-10-CM | POA: Diagnosis not present

## 2019-05-19 DIAGNOSIS — I130 Hypertensive heart and chronic kidney disease with heart failure and stage 1 through stage 4 chronic kidney disease, or unspecified chronic kidney disease: Secondary | ICD-10-CM | POA: Diagnosis not present

## 2019-05-19 DIAGNOSIS — I5033 Acute on chronic diastolic (congestive) heart failure: Secondary | ICD-10-CM | POA: Diagnosis not present

## 2019-05-19 DIAGNOSIS — N179 Acute kidney failure, unspecified: Secondary | ICD-10-CM | POA: Diagnosis not present

## 2019-05-19 DIAGNOSIS — N184 Chronic kidney disease, stage 4 (severe): Secondary | ICD-10-CM | POA: Diagnosis not present

## 2019-05-19 DIAGNOSIS — R0602 Shortness of breath: Secondary | ICD-10-CM | POA: Diagnosis not present

## 2019-05-19 DIAGNOSIS — Z0289 Encounter for other administrative examinations: Secondary | ICD-10-CM | POA: Diagnosis not present

## 2019-05-19 DIAGNOSIS — R0600 Dyspnea, unspecified: Secondary | ICD-10-CM | POA: Diagnosis not present

## 2019-05-19 DIAGNOSIS — J189 Pneumonia, unspecified organism: Secondary | ICD-10-CM | POA: Diagnosis not present

## 2019-05-19 DIAGNOSIS — G9341 Metabolic encephalopathy: Secondary | ICD-10-CM | POA: Diagnosis not present

## 2019-05-20 DIAGNOSIS — N189 Chronic kidney disease, unspecified: Secondary | ICD-10-CM | POA: Diagnosis not present

## 2019-05-20 DIAGNOSIS — D631 Anemia in chronic kidney disease: Secondary | ICD-10-CM | POA: Diagnosis not present

## 2019-05-20 DIAGNOSIS — E871 Hypo-osmolality and hyponatremia: Secondary | ICD-10-CM | POA: Diagnosis not present

## 2019-05-20 DIAGNOSIS — I5030 Unspecified diastolic (congestive) heart failure: Secondary | ICD-10-CM | POA: Diagnosis not present

## 2019-05-20 DIAGNOSIS — I482 Chronic atrial fibrillation, unspecified: Secondary | ICD-10-CM | POA: Diagnosis not present

## 2019-05-20 DIAGNOSIS — N179 Acute kidney failure, unspecified: Secondary | ICD-10-CM | POA: Diagnosis not present

## 2019-05-20 DIAGNOSIS — E119 Type 2 diabetes mellitus without complications: Secondary | ICD-10-CM | POA: Diagnosis not present

## 2019-05-20 DIAGNOSIS — N186 End stage renal disease: Secondary | ICD-10-CM | POA: Diagnosis not present

## 2019-05-20 DIAGNOSIS — N183 Chronic kidney disease, stage 3 (moderate): Secondary | ICD-10-CM | POA: Diagnosis not present

## 2019-05-20 DIAGNOSIS — I509 Heart failure, unspecified: Secondary | ICD-10-CM | POA: Diagnosis not present

## 2019-05-20 DIAGNOSIS — Z951 Presence of aortocoronary bypass graft: Secondary | ICD-10-CM | POA: Diagnosis not present

## 2019-05-20 DIAGNOSIS — I5033 Acute on chronic diastolic (congestive) heart failure: Secondary | ICD-10-CM | POA: Diagnosis not present

## 2019-05-20 DIAGNOSIS — D72829 Elevated white blood cell count, unspecified: Secondary | ICD-10-CM | POA: Diagnosis not present

## 2019-05-21 DIAGNOSIS — Z951 Presence of aortocoronary bypass graft: Secondary | ICD-10-CM | POA: Diagnosis not present

## 2019-05-21 DIAGNOSIS — I5030 Unspecified diastolic (congestive) heart failure: Secondary | ICD-10-CM | POA: Diagnosis not present

## 2019-05-21 DIAGNOSIS — D72829 Elevated white blood cell count, unspecified: Secondary | ICD-10-CM | POA: Diagnosis not present

## 2019-05-21 DIAGNOSIS — N186 End stage renal disease: Secondary | ICD-10-CM | POA: Diagnosis not present

## 2019-05-21 DIAGNOSIS — N179 Acute kidney failure, unspecified: Secondary | ICD-10-CM | POA: Diagnosis not present

## 2019-05-21 DIAGNOSIS — N183 Chronic kidney disease, stage 3 (moderate): Secondary | ICD-10-CM | POA: Diagnosis not present

## 2019-05-21 DIAGNOSIS — I5033 Acute on chronic diastolic (congestive) heart failure: Secondary | ICD-10-CM | POA: Diagnosis not present

## 2019-05-21 DIAGNOSIS — E119 Type 2 diabetes mellitus without complications: Secondary | ICD-10-CM | POA: Diagnosis not present

## 2019-05-21 DIAGNOSIS — N189 Chronic kidney disease, unspecified: Secondary | ICD-10-CM | POA: Diagnosis not present

## 2019-05-21 DIAGNOSIS — R0902 Hypoxemia: Secondary | ICD-10-CM | POA: Diagnosis not present

## 2019-05-21 DIAGNOSIS — I482 Chronic atrial fibrillation, unspecified: Secondary | ICD-10-CM | POA: Diagnosis not present

## 2019-05-21 DIAGNOSIS — I129 Hypertensive chronic kidney disease with stage 1 through stage 4 chronic kidney disease, or unspecified chronic kidney disease: Secondary | ICD-10-CM | POA: Diagnosis not present

## 2019-05-22 DIAGNOSIS — R0602 Shortness of breath: Secondary | ICD-10-CM | POA: Diagnosis not present

## 2019-05-22 DIAGNOSIS — E1122 Type 2 diabetes mellitus with diabetic chronic kidney disease: Secondary | ICD-10-CM | POA: Diagnosis present

## 2019-05-22 DIAGNOSIS — I5033 Acute on chronic diastolic (congestive) heart failure: Secondary | ICD-10-CM | POA: Diagnosis not present

## 2019-05-22 DIAGNOSIS — F05 Delirium due to known physiological condition: Secondary | ICD-10-CM | POA: Diagnosis not present

## 2019-05-22 DIAGNOSIS — I471 Supraventricular tachycardia: Secondary | ICD-10-CM | POA: Diagnosis not present

## 2019-05-22 DIAGNOSIS — Z22322 Carrier or suspected carrier of Methicillin resistant Staphylococcus aureus: Secondary | ICD-10-CM | POA: Diagnosis not present

## 2019-05-22 DIAGNOSIS — E871 Hypo-osmolality and hyponatremia: Secondary | ICD-10-CM | POA: Diagnosis not present

## 2019-05-22 DIAGNOSIS — I10 Essential (primary) hypertension: Secondary | ICD-10-CM | POA: Diagnosis not present

## 2019-05-22 DIAGNOSIS — R0902 Hypoxemia: Secondary | ICD-10-CM | POA: Diagnosis present

## 2019-05-22 DIAGNOSIS — R51 Headache: Secondary | ICD-10-CM | POA: Diagnosis not present

## 2019-05-22 DIAGNOSIS — D72829 Elevated white blood cell count, unspecified: Secondary | ICD-10-CM | POA: Diagnosis not present

## 2019-05-22 DIAGNOSIS — G9341 Metabolic encephalopathy: Secondary | ICD-10-CM | POA: Diagnosis not present

## 2019-05-22 DIAGNOSIS — D649 Anemia, unspecified: Secondary | ICD-10-CM | POA: Diagnosis present

## 2019-05-22 DIAGNOSIS — I5023 Acute on chronic systolic (congestive) heart failure: Secondary | ICD-10-CM | POA: Diagnosis not present

## 2019-05-22 DIAGNOSIS — Z992 Dependence on renal dialysis: Secondary | ICD-10-CM | POA: Diagnosis not present

## 2019-05-22 DIAGNOSIS — E876 Hypokalemia: Secondary | ICD-10-CM | POA: Diagnosis not present

## 2019-05-22 DIAGNOSIS — E785 Hyperlipidemia, unspecified: Secondary | ICD-10-CM | POA: Diagnosis present

## 2019-05-22 DIAGNOSIS — I482 Chronic atrial fibrillation, unspecified: Secondary | ICD-10-CM | POA: Diagnosis not present

## 2019-05-22 DIAGNOSIS — M79662 Pain in left lower leg: Secondary | ICD-10-CM | POA: Diagnosis not present

## 2019-05-22 DIAGNOSIS — E1165 Type 2 diabetes mellitus with hyperglycemia: Secondary | ICD-10-CM | POA: Diagnosis present

## 2019-05-22 DIAGNOSIS — Z7901 Long term (current) use of anticoagulants: Secondary | ICD-10-CM | POA: Diagnosis not present

## 2019-05-22 DIAGNOSIS — N184 Chronic kidney disease, stage 4 (severe): Secondary | ICD-10-CM | POA: Diagnosis present

## 2019-05-22 DIAGNOSIS — J189 Pneumonia, unspecified organism: Secondary | ICD-10-CM | POA: Diagnosis not present

## 2019-05-22 DIAGNOSIS — I130 Hypertensive heart and chronic kidney disease with heart failure and stage 1 through stage 4 chronic kidney disease, or unspecified chronic kidney disease: Secondary | ICD-10-CM | POA: Diagnosis present

## 2019-05-22 DIAGNOSIS — N186 End stage renal disease: Secondary | ICD-10-CM | POA: Diagnosis not present

## 2019-05-22 DIAGNOSIS — N179 Acute kidney failure, unspecified: Secondary | ICD-10-CM | POA: Diagnosis not present

## 2019-05-22 DIAGNOSIS — I701 Atherosclerosis of renal artery: Secondary | ICD-10-CM | POA: Diagnosis present

## 2019-05-22 DIAGNOSIS — I2510 Atherosclerotic heart disease of native coronary artery without angina pectoris: Secondary | ICD-10-CM | POA: Diagnosis present

## 2019-05-22 DIAGNOSIS — I5030 Unspecified diastolic (congestive) heart failure: Secondary | ICD-10-CM | POA: Diagnosis not present

## 2019-05-22 DIAGNOSIS — E039 Hypothyroidism, unspecified: Secondary | ICD-10-CM | POA: Diagnosis present

## 2019-05-22 DIAGNOSIS — Z951 Presence of aortocoronary bypass graft: Secondary | ICD-10-CM | POA: Diagnosis not present

## 2019-05-22 DIAGNOSIS — N189 Chronic kidney disease, unspecified: Secondary | ICD-10-CM | POA: Diagnosis not present

## 2019-05-22 DIAGNOSIS — Z794 Long term (current) use of insulin: Secondary | ICD-10-CM | POA: Diagnosis not present

## 2019-05-22 DIAGNOSIS — E119 Type 2 diabetes mellitus without complications: Secondary | ICD-10-CM | POA: Diagnosis not present

## 2019-05-23 DIAGNOSIS — N179 Acute kidney failure, unspecified: Secondary | ICD-10-CM | POA: Diagnosis not present

## 2019-05-23 DIAGNOSIS — D72829 Elevated white blood cell count, unspecified: Secondary | ICD-10-CM | POA: Diagnosis not present

## 2019-05-23 DIAGNOSIS — I5033 Acute on chronic diastolic (congestive) heart failure: Secondary | ICD-10-CM | POA: Diagnosis not present

## 2019-05-23 DIAGNOSIS — N189 Chronic kidney disease, unspecified: Secondary | ICD-10-CM | POA: Diagnosis not present

## 2019-05-23 DIAGNOSIS — E119 Type 2 diabetes mellitus without complications: Secondary | ICD-10-CM | POA: Diagnosis not present

## 2019-05-24 DIAGNOSIS — N189 Chronic kidney disease, unspecified: Secondary | ICD-10-CM | POA: Diagnosis not present

## 2019-05-24 DIAGNOSIS — I5033 Acute on chronic diastolic (congestive) heart failure: Secondary | ICD-10-CM | POA: Diagnosis not present

## 2019-05-24 DIAGNOSIS — N179 Acute kidney failure, unspecified: Secondary | ICD-10-CM | POA: Diagnosis not present

## 2019-05-24 DIAGNOSIS — D72829 Elevated white blood cell count, unspecified: Secondary | ICD-10-CM | POA: Diagnosis not present

## 2019-05-24 DIAGNOSIS — E119 Type 2 diabetes mellitus without complications: Secondary | ICD-10-CM | POA: Diagnosis not present

## 2019-05-25 DIAGNOSIS — N179 Acute kidney failure, unspecified: Secondary | ICD-10-CM | POA: Diagnosis not present

## 2019-05-25 DIAGNOSIS — E119 Type 2 diabetes mellitus without complications: Secondary | ICD-10-CM | POA: Diagnosis not present

## 2019-05-25 DIAGNOSIS — N189 Chronic kidney disease, unspecified: Secondary | ICD-10-CM | POA: Diagnosis not present

## 2019-05-25 DIAGNOSIS — D72829 Elevated white blood cell count, unspecified: Secondary | ICD-10-CM | POA: Diagnosis not present

## 2019-05-25 DIAGNOSIS — I5033 Acute on chronic diastolic (congestive) heart failure: Secondary | ICD-10-CM | POA: Diagnosis not present

## 2019-05-26 DIAGNOSIS — E119 Type 2 diabetes mellitus without complications: Secondary | ICD-10-CM | POA: Diagnosis not present

## 2019-05-26 DIAGNOSIS — I5033 Acute on chronic diastolic (congestive) heart failure: Secondary | ICD-10-CM | POA: Diagnosis not present

## 2019-05-26 DIAGNOSIS — N179 Acute kidney failure, unspecified: Secondary | ICD-10-CM | POA: Diagnosis not present

## 2019-05-26 DIAGNOSIS — D72829 Elevated white blood cell count, unspecified: Secondary | ICD-10-CM | POA: Diagnosis not present

## 2019-05-26 DIAGNOSIS — N189 Chronic kidney disease, unspecified: Secondary | ICD-10-CM | POA: Diagnosis not present

## 2019-05-27 DIAGNOSIS — I5033 Acute on chronic diastolic (congestive) heart failure: Secondary | ICD-10-CM | POA: Diagnosis not present

## 2019-05-27 DIAGNOSIS — D72829 Elevated white blood cell count, unspecified: Secondary | ICD-10-CM | POA: Diagnosis not present

## 2019-05-27 DIAGNOSIS — N189 Chronic kidney disease, unspecified: Secondary | ICD-10-CM | POA: Diagnosis not present

## 2019-05-27 DIAGNOSIS — E119 Type 2 diabetes mellitus without complications: Secondary | ICD-10-CM | POA: Diagnosis not present

## 2019-05-27 DIAGNOSIS — N179 Acute kidney failure, unspecified: Secondary | ICD-10-CM | POA: Diagnosis not present

## 2019-05-28 DIAGNOSIS — E119 Type 2 diabetes mellitus without complications: Secondary | ICD-10-CM | POA: Diagnosis not present

## 2019-05-28 DIAGNOSIS — D72829 Elevated white blood cell count, unspecified: Secondary | ICD-10-CM | POA: Diagnosis not present

## 2019-05-28 DIAGNOSIS — N189 Chronic kidney disease, unspecified: Secondary | ICD-10-CM | POA: Diagnosis not present

## 2019-05-28 DIAGNOSIS — I5033 Acute on chronic diastolic (congestive) heart failure: Secondary | ICD-10-CM | POA: Diagnosis not present

## 2019-05-28 DIAGNOSIS — N179 Acute kidney failure, unspecified: Secondary | ICD-10-CM | POA: Diagnosis not present

## 2019-05-29 DIAGNOSIS — E119 Type 2 diabetes mellitus without complications: Secondary | ICD-10-CM | POA: Diagnosis not present

## 2019-05-29 DIAGNOSIS — N179 Acute kidney failure, unspecified: Secondary | ICD-10-CM | POA: Diagnosis not present

## 2019-05-29 DIAGNOSIS — N189 Chronic kidney disease, unspecified: Secondary | ICD-10-CM | POA: Diagnosis not present

## 2019-05-29 DIAGNOSIS — I5033 Acute on chronic diastolic (congestive) heart failure: Secondary | ICD-10-CM | POA: Diagnosis not present

## 2019-05-29 DIAGNOSIS — D72829 Elevated white blood cell count, unspecified: Secondary | ICD-10-CM | POA: Diagnosis not present

## 2019-05-30 DIAGNOSIS — N189 Chronic kidney disease, unspecified: Secondary | ICD-10-CM | POA: Diagnosis not present

## 2019-05-30 DIAGNOSIS — E119 Type 2 diabetes mellitus without complications: Secondary | ICD-10-CM | POA: Diagnosis not present

## 2019-05-30 DIAGNOSIS — D72829 Elevated white blood cell count, unspecified: Secondary | ICD-10-CM | POA: Diagnosis not present

## 2019-05-30 DIAGNOSIS — I5033 Acute on chronic diastolic (congestive) heart failure: Secondary | ICD-10-CM | POA: Diagnosis not present

## 2019-05-30 DIAGNOSIS — N179 Acute kidney failure, unspecified: Secondary | ICD-10-CM | POA: Diagnosis not present

## 2019-06-05 DIAGNOSIS — Z1159 Encounter for screening for other viral diseases: Secondary | ICD-10-CM | POA: Diagnosis not present

## 2019-06-06 DIAGNOSIS — Z1159 Encounter for screening for other viral diseases: Secondary | ICD-10-CM | POA: Diagnosis not present

## 2019-06-08 DIAGNOSIS — N179 Acute kidney failure, unspecified: Secondary | ICD-10-CM | POA: Diagnosis not present

## 2019-06-08 DIAGNOSIS — R0602 Shortness of breath: Secondary | ICD-10-CM | POA: Diagnosis not present

## 2019-06-08 DIAGNOSIS — N189 Chronic kidney disease, unspecified: Secondary | ICD-10-CM | POA: Diagnosis not present

## 2019-06-08 DIAGNOSIS — R5381 Other malaise: Secondary | ICD-10-CM | POA: Diagnosis not present

## 2019-06-08 DIAGNOSIS — I509 Heart failure, unspecified: Secondary | ICD-10-CM | POA: Diagnosis not present

## 2019-06-10 DIAGNOSIS — I5032 Chronic diastolic (congestive) heart failure: Secondary | ICD-10-CM | POA: Diagnosis not present

## 2019-06-10 DIAGNOSIS — D649 Anemia, unspecified: Secondary | ICD-10-CM | POA: Diagnosis not present

## 2019-06-10 DIAGNOSIS — N390 Urinary tract infection, site not specified: Secondary | ICD-10-CM | POA: Diagnosis not present

## 2019-06-10 DIAGNOSIS — I10 Essential (primary) hypertension: Secondary | ICD-10-CM | POA: Diagnosis not present

## 2019-06-10 DIAGNOSIS — R278 Other lack of coordination: Secondary | ICD-10-CM | POA: Diagnosis not present

## 2019-06-10 DIAGNOSIS — J449 Chronic obstructive pulmonary disease, unspecified: Secondary | ICD-10-CM | POA: Diagnosis not present

## 2019-06-10 DIAGNOSIS — R0600 Dyspnea, unspecified: Secondary | ICD-10-CM | POA: Diagnosis not present

## 2019-06-10 DIAGNOSIS — N183 Chronic kidney disease, stage 3 unspecified: Secondary | ICD-10-CM | POA: Diagnosis not present

## 2019-06-10 DIAGNOSIS — M6281 Muscle weakness (generalized): Secondary | ICD-10-CM | POA: Diagnosis not present

## 2019-06-10 DIAGNOSIS — R0602 Shortness of breath: Secondary | ICD-10-CM | POA: Diagnosis not present

## 2019-06-10 DIAGNOSIS — I4892 Unspecified atrial flutter: Secondary | ICD-10-CM | POA: Diagnosis not present

## 2019-06-10 DIAGNOSIS — I6523 Occlusion and stenosis of bilateral carotid arteries: Secondary | ICD-10-CM | POA: Diagnosis present

## 2019-06-10 DIAGNOSIS — R4182 Altered mental status, unspecified: Secondary | ICD-10-CM | POA: Diagnosis not present

## 2019-06-10 DIAGNOSIS — N1831 Chronic kidney disease, stage 3a: Secondary | ICD-10-CM | POA: Diagnosis not present

## 2019-06-10 DIAGNOSIS — E1122 Type 2 diabetes mellitus with diabetic chronic kidney disease: Secondary | ICD-10-CM | POA: Diagnosis not present

## 2019-06-10 DIAGNOSIS — I480 Paroxysmal atrial fibrillation: Secondary | ICD-10-CM | POA: Diagnosis not present

## 2019-06-10 DIAGNOSIS — E1165 Type 2 diabetes mellitus with hyperglycemia: Secondary | ICD-10-CM | POA: Diagnosis not present

## 2019-06-10 DIAGNOSIS — Z20828 Contact with and (suspected) exposure to other viral communicable diseases: Secondary | ICD-10-CM | POA: Diagnosis present

## 2019-06-10 DIAGNOSIS — J9601 Acute respiratory failure with hypoxia: Secondary | ICD-10-CM | POA: Diagnosis present

## 2019-06-10 DIAGNOSIS — G9341 Metabolic encephalopathy: Secondary | ICD-10-CM | POA: Diagnosis not present

## 2019-06-10 DIAGNOSIS — R0989 Other specified symptoms and signs involving the circulatory and respiratory systems: Secondary | ICD-10-CM | POA: Diagnosis not present

## 2019-06-10 DIAGNOSIS — I2510 Atherosclerotic heart disease of native coronary artery without angina pectoris: Secondary | ICD-10-CM | POA: Diagnosis not present

## 2019-06-10 DIAGNOSIS — G309 Alzheimer's disease, unspecified: Secondary | ICD-10-CM | POA: Diagnosis not present

## 2019-06-10 DIAGNOSIS — F05 Delirium due to known physiological condition: Secondary | ICD-10-CM | POA: Diagnosis present

## 2019-06-10 DIAGNOSIS — E1142 Type 2 diabetes mellitus with diabetic polyneuropathy: Secondary | ICD-10-CM | POA: Diagnosis not present

## 2019-06-10 DIAGNOSIS — N3000 Acute cystitis without hematuria: Secondary | ICD-10-CM | POA: Diagnosis not present

## 2019-06-10 DIAGNOSIS — D631 Anemia in chronic kidney disease: Secondary | ICD-10-CM | POA: Diagnosis not present

## 2019-06-10 DIAGNOSIS — I517 Cardiomegaly: Secondary | ICD-10-CM | POA: Diagnosis not present

## 2019-06-10 DIAGNOSIS — Z951 Presence of aortocoronary bypass graft: Secondary | ICD-10-CM | POA: Diagnosis not present

## 2019-06-10 DIAGNOSIS — D638 Anemia in other chronic diseases classified elsewhere: Secondary | ICD-10-CM | POA: Diagnosis not present

## 2019-06-10 DIAGNOSIS — G301 Alzheimer's disease with late onset: Secondary | ICD-10-CM | POA: Diagnosis present

## 2019-06-10 DIAGNOSIS — N1832 Chronic kidney disease, stage 3b: Secondary | ICD-10-CM | POA: Diagnosis present

## 2019-06-10 DIAGNOSIS — Z79890 Hormone replacement therapy (postmenopausal): Secondary | ICD-10-CM | POA: Diagnosis not present

## 2019-06-10 DIAGNOSIS — R918 Other nonspecific abnormal finding of lung field: Secondary | ICD-10-CM | POA: Diagnosis not present

## 2019-06-10 DIAGNOSIS — N189 Chronic kidney disease, unspecified: Secondary | ICD-10-CM | POA: Diagnosis not present

## 2019-06-10 DIAGNOSIS — R41841 Cognitive communication deficit: Secondary | ICD-10-CM | POA: Diagnosis not present

## 2019-06-10 DIAGNOSIS — I130 Hypertensive heart and chronic kidney disease with heart failure and stage 1 through stage 4 chronic kidney disease, or unspecified chronic kidney disease: Secondary | ICD-10-CM | POA: Diagnosis not present

## 2019-06-10 DIAGNOSIS — F0280 Dementia in other diseases classified elsewhere without behavioral disturbance: Secondary | ICD-10-CM | POA: Diagnosis present

## 2019-06-10 DIAGNOSIS — I129 Hypertensive chronic kidney disease with stage 1 through stage 4 chronic kidney disease, or unspecified chronic kidney disease: Secondary | ICD-10-CM | POA: Diagnosis not present

## 2019-06-10 DIAGNOSIS — Z7901 Long term (current) use of anticoagulants: Secondary | ICD-10-CM | POA: Diagnosis not present

## 2019-06-10 DIAGNOSIS — Z794 Long term (current) use of insulin: Secondary | ICD-10-CM | POA: Diagnosis not present

## 2019-06-10 DIAGNOSIS — E039 Hypothyroidism, unspecified: Secondary | ICD-10-CM | POA: Diagnosis not present

## 2019-06-10 DIAGNOSIS — J432 Centrilobular emphysema: Secondary | ICD-10-CM | POA: Diagnosis present

## 2019-06-10 DIAGNOSIS — N179 Acute kidney failure, unspecified: Secondary | ICD-10-CM | POA: Diagnosis not present

## 2019-06-19 DIAGNOSIS — I130 Hypertensive heart and chronic kidney disease with heart failure and stage 1 through stage 4 chronic kidney disease, or unspecified chronic kidney disease: Secondary | ICD-10-CM | POA: Diagnosis present

## 2019-06-19 DIAGNOSIS — I080 Rheumatic disorders of both mitral and aortic valves: Secondary | ICD-10-CM | POA: Diagnosis present

## 2019-06-19 DIAGNOSIS — Z66 Do not resuscitate: Secondary | ICD-10-CM | POA: Diagnosis present

## 2019-06-19 DIAGNOSIS — R52 Pain, unspecified: Secondary | ICD-10-CM | POA: Diagnosis not present

## 2019-06-19 DIAGNOSIS — Z881 Allergy status to other antibiotic agents status: Secondary | ICD-10-CM | POA: Diagnosis not present

## 2019-06-19 DIAGNOSIS — I480 Paroxysmal atrial fibrillation: Secondary | ICD-10-CM | POA: Diagnosis not present

## 2019-06-19 DIAGNOSIS — J432 Centrilobular emphysema: Secondary | ICD-10-CM | POA: Diagnosis present

## 2019-06-19 DIAGNOSIS — J90 Pleural effusion, not elsewhere classified: Secondary | ICD-10-CM | POA: Diagnosis not present

## 2019-06-19 DIAGNOSIS — R0689 Other abnormalities of breathing: Secondary | ICD-10-CM | POA: Diagnosis not present

## 2019-06-19 DIAGNOSIS — M6281 Muscle weakness (generalized): Secondary | ICD-10-CM | POA: Diagnosis not present

## 2019-06-19 DIAGNOSIS — G309 Alzheimer's disease, unspecified: Secondary | ICD-10-CM | POA: Diagnosis not present

## 2019-06-19 DIAGNOSIS — D509 Iron deficiency anemia, unspecified: Secondary | ICD-10-CM | POA: Diagnosis present

## 2019-06-19 DIAGNOSIS — D649 Anemia, unspecified: Secondary | ICD-10-CM | POA: Diagnosis not present

## 2019-06-19 DIAGNOSIS — Z91040 Latex allergy status: Secondary | ICD-10-CM | POA: Diagnosis not present

## 2019-06-19 DIAGNOSIS — E1122 Type 2 diabetes mellitus with diabetic chronic kidney disease: Secondary | ICD-10-CM | POA: Diagnosis present

## 2019-06-19 DIAGNOSIS — G301 Alzheimer's disease with late onset: Secondary | ICD-10-CM | POA: Diagnosis present

## 2019-06-19 DIAGNOSIS — Z794 Long term (current) use of insulin: Secondary | ICD-10-CM | POA: Diagnosis not present

## 2019-06-19 DIAGNOSIS — R0902 Hypoxemia: Secondary | ICD-10-CM | POA: Diagnosis not present

## 2019-06-19 DIAGNOSIS — Z20828 Contact with and (suspected) exposure to other viral communicable diseases: Secondary | ICD-10-CM | POA: Diagnosis not present

## 2019-06-19 DIAGNOSIS — Z515 Encounter for palliative care: Secondary | ICD-10-CM | POA: Diagnosis not present

## 2019-06-19 DIAGNOSIS — R531 Weakness: Secondary | ICD-10-CM | POA: Diagnosis not present

## 2019-06-19 DIAGNOSIS — N289 Disorder of kidney and ureter, unspecified: Secondary | ICD-10-CM | POA: Diagnosis not present

## 2019-06-19 DIAGNOSIS — N184 Chronic kidney disease, stage 4 (severe): Secondary | ICD-10-CM | POA: Diagnosis not present

## 2019-06-19 DIAGNOSIS — F0280 Dementia in other diseases classified elsewhere without behavioral disturbance: Secondary | ICD-10-CM | POA: Diagnosis present

## 2019-06-19 DIAGNOSIS — I110 Hypertensive heart disease with heart failure: Secondary | ICD-10-CM | POA: Diagnosis not present

## 2019-06-19 DIAGNOSIS — E1121 Type 2 diabetes mellitus with diabetic nephropathy: Secondary | ICD-10-CM | POA: Diagnosis not present

## 2019-06-19 DIAGNOSIS — I4891 Unspecified atrial fibrillation: Secondary | ICD-10-CM | POA: Diagnosis not present

## 2019-06-19 DIAGNOSIS — E1169 Type 2 diabetes mellitus with other specified complication: Secondary | ICD-10-CM | POA: Diagnosis present

## 2019-06-19 DIAGNOSIS — N179 Acute kidney failure, unspecified: Secondary | ICD-10-CM | POA: Diagnosis present

## 2019-06-19 DIAGNOSIS — G9341 Metabolic encephalopathy: Secondary | ICD-10-CM | POA: Diagnosis not present

## 2019-06-19 DIAGNOSIS — E782 Mixed hyperlipidemia: Secondary | ICD-10-CM | POA: Diagnosis present

## 2019-06-19 DIAGNOSIS — N1832 Chronic kidney disease, stage 3b: Secondary | ICD-10-CM | POA: Diagnosis not present

## 2019-06-19 DIAGNOSIS — R110 Nausea: Secondary | ICD-10-CM | POA: Diagnosis not present

## 2019-06-19 DIAGNOSIS — R278 Other lack of coordination: Secondary | ICD-10-CM | POA: Diagnosis not present

## 2019-06-19 DIAGNOSIS — R0602 Shortness of breath: Secondary | ICD-10-CM | POA: Diagnosis not present

## 2019-06-19 DIAGNOSIS — I5032 Chronic diastolic (congestive) heart failure: Secondary | ICD-10-CM | POA: Diagnosis not present

## 2019-06-19 DIAGNOSIS — I509 Heart failure, unspecified: Secondary | ICD-10-CM | POA: Diagnosis not present

## 2019-06-19 DIAGNOSIS — J9601 Acute respiratory failure with hypoxia: Secondary | ICD-10-CM | POA: Diagnosis present

## 2019-06-19 DIAGNOSIS — I701 Atherosclerosis of renal artery: Secondary | ICD-10-CM | POA: Diagnosis present

## 2019-06-19 DIAGNOSIS — Z7901 Long term (current) use of anticoagulants: Secondary | ICD-10-CM | POA: Diagnosis not present

## 2019-06-19 DIAGNOSIS — I129 Hypertensive chronic kidney disease with stage 1 through stage 4 chronic kidney disease, or unspecified chronic kidney disease: Secondary | ICD-10-CM | POA: Diagnosis not present

## 2019-06-19 DIAGNOSIS — I4819 Other persistent atrial fibrillation: Secondary | ICD-10-CM | POA: Diagnosis present

## 2019-06-19 DIAGNOSIS — R069 Unspecified abnormalities of breathing: Secondary | ICD-10-CM | POA: Diagnosis not present

## 2019-06-19 DIAGNOSIS — N1831 Chronic kidney disease, stage 3a: Secondary | ICD-10-CM | POA: Diagnosis not present

## 2019-06-19 DIAGNOSIS — R41841 Cognitive communication deficit: Secondary | ICD-10-CM | POA: Diagnosis not present

## 2019-06-19 DIAGNOSIS — J449 Chronic obstructive pulmonary disease, unspecified: Secondary | ICD-10-CM | POA: Diagnosis not present

## 2019-06-19 DIAGNOSIS — I5033 Acute on chronic diastolic (congestive) heart failure: Secondary | ICD-10-CM | POA: Diagnosis present

## 2019-06-19 DIAGNOSIS — E1142 Type 2 diabetes mellitus with diabetic polyneuropathy: Secondary | ICD-10-CM | POA: Diagnosis not present

## 2019-06-19 DIAGNOSIS — R918 Other nonspecific abnormal finding of lung field: Secondary | ICD-10-CM | POA: Diagnosis not present

## 2019-06-19 DIAGNOSIS — E039 Hypothyroidism, unspecified: Secondary | ICD-10-CM | POA: Diagnosis not present

## 2019-06-19 DIAGNOSIS — I2510 Atherosclerotic heart disease of native coronary artery without angina pectoris: Secondary | ICD-10-CM | POA: Diagnosis not present

## 2019-06-19 DIAGNOSIS — D638 Anemia in other chronic diseases classified elsewhere: Secondary | ICD-10-CM | POA: Diagnosis not present

## 2019-06-19 DIAGNOSIS — I10 Essential (primary) hypertension: Secondary | ICD-10-CM | POA: Diagnosis not present

## 2019-06-20 DIAGNOSIS — I4891 Unspecified atrial fibrillation: Secondary | ICD-10-CM | POA: Diagnosis not present

## 2019-06-20 DIAGNOSIS — I10 Essential (primary) hypertension: Secondary | ICD-10-CM | POA: Diagnosis not present

## 2019-06-20 DIAGNOSIS — Z794 Long term (current) use of insulin: Secondary | ICD-10-CM | POA: Diagnosis not present

## 2019-06-20 DIAGNOSIS — N1832 Chronic kidney disease, stage 3b: Secondary | ICD-10-CM | POA: Diagnosis not present

## 2019-06-20 DIAGNOSIS — R110 Nausea: Secondary | ICD-10-CM | POA: Diagnosis not present

## 2019-06-20 DIAGNOSIS — E1142 Type 2 diabetes mellitus with diabetic polyneuropathy: Secondary | ICD-10-CM | POA: Diagnosis not present

## 2019-06-20 DIAGNOSIS — Z7901 Long term (current) use of anticoagulants: Secondary | ICD-10-CM | POA: Diagnosis not present

## 2019-06-20 DIAGNOSIS — E1121 Type 2 diabetes mellitus with diabetic nephropathy: Secondary | ICD-10-CM | POA: Diagnosis not present

## 2019-06-20 MED ORDER — POLYSACCHARIDE IRON COMPLEX 150 MG PO CAPS
150.00 | ORAL_CAPSULE | ORAL | Status: DC
Start: 2019-06-20 — End: 2019-06-20

## 2019-06-20 MED ORDER — GENERIC EXTERNAL MEDICATION
Status: DC
Start: ? — End: 2019-06-20

## 2019-06-20 MED ORDER — BUDESONIDE-FORMOTEROL FUMARATE 160-4.5 MCG/ACT IN AERO
2.00 | INHALATION_SPRAY | RESPIRATORY_TRACT | Status: DC
Start: 2019-06-19 — End: 2019-06-20

## 2019-06-20 MED ORDER — ASPIRIN EC 81 MG PO TBEC
81.00 | DELAYED_RELEASE_TABLET | ORAL | Status: DC
Start: 2019-06-20 — End: 2019-06-20

## 2019-06-20 MED ORDER — SODIUM CHLORIDE 0.9 % IV SOLN
10.00 | INTRAVENOUS | Status: DC
Start: ? — End: 2019-06-20

## 2019-06-20 MED ORDER — INSULIN GLARGINE 100 UNIT/ML ~~LOC~~ SOLN
1.00 | SUBCUTANEOUS | Status: DC
Start: 2019-06-19 — End: 2019-06-20

## 2019-06-20 MED ORDER — PANTOPRAZOLE SODIUM 40 MG PO TBEC
40.00 | DELAYED_RELEASE_TABLET | ORAL | Status: DC
Start: 2019-06-20 — End: 2019-06-20

## 2019-06-20 MED ORDER — CARVEDILOL 12.5 MG PO TABS
12.50 | ORAL_TABLET | ORAL | Status: DC
Start: 2019-06-19 — End: 2019-06-20

## 2019-06-20 MED ORDER — NITROGLYCERIN 0.4 MG SL SUBL
0.40 | SUBLINGUAL_TABLET | SUBLINGUAL | Status: DC
Start: ? — End: 2019-06-20

## 2019-06-20 MED ORDER — APIXABAN 2.5 MG PO TABS
2.50 | ORAL_TABLET | ORAL | Status: DC
Start: 2019-06-19 — End: 2019-06-20

## 2019-06-20 MED ORDER — INSULIN LISPRO 100 UNIT/ML ~~LOC~~ SOLN
1.00 | SUBCUTANEOUS | Status: DC
Start: 2019-06-19 — End: 2019-06-20

## 2019-06-20 MED ORDER — AMLODIPINE BESYLATE 10 MG PO TABS
10.00 | ORAL_TABLET | ORAL | Status: DC
Start: 2019-06-20 — End: 2019-06-20

## 2019-06-20 MED ORDER — NEPHRO-VITE 0.8 MG PO TABS
1.00 | ORAL_TABLET | ORAL | Status: DC
Start: 2019-06-20 — End: 2019-06-20

## 2019-06-20 MED ORDER — ACETAMINOPHEN 325 MG PO TABS
650.00 | ORAL_TABLET | ORAL | Status: DC
Start: ? — End: 2019-06-20

## 2019-06-20 MED ORDER — FUROSEMIDE 40 MG PO TABS
40.00 | ORAL_TABLET | ORAL | Status: DC
Start: 2019-06-20 — End: 2019-06-20

## 2019-06-20 MED ORDER — LEVOTHYROXINE SODIUM 88 MCG PO TABS
88.00 | ORAL_TABLET | ORAL | Status: DC
Start: 2019-06-20 — End: 2019-06-20

## 2019-06-20 MED ORDER — POLYETHYLENE GLYCOL 3350 17 G PO PACK
17.00 | PACK | ORAL | Status: DC
Start: ? — End: 2019-06-20

## 2019-06-20 MED ORDER — IPRATROPIUM-ALBUTEROL 0.5-2.5 (3) MG/3ML IN SOLN
3.00 | RESPIRATORY_TRACT | Status: DC
Start: ? — End: 2019-06-20

## 2019-06-20 MED ORDER — INSULIN GLARGINE 100 UNIT/ML ~~LOC~~ SOLN
1.00 | SUBCUTANEOUS | Status: DC
Start: ? — End: 2019-06-20

## 2019-06-20 MED ORDER — INSULIN LISPRO 100 UNIT/ML ~~LOC~~ SOLN
1.00 | SUBCUTANEOUS | Status: DC
Start: ? — End: 2019-06-20

## 2019-06-20 MED ORDER — ALBUTEROL SULFATE (2.5 MG/3ML) 0.083% IN NEBU
2.50 | INHALATION_SOLUTION | RESPIRATORY_TRACT | Status: DC
Start: ? — End: 2019-06-20

## 2019-06-20 MED ORDER — HYDRALAZINE HCL 25 MG PO TABS
25.00 | ORAL_TABLET | ORAL | Status: DC
Start: 2019-06-19 — End: 2019-06-20

## 2019-06-20 MED ORDER — ATORVASTATIN CALCIUM 40 MG PO TABS
40.00 | ORAL_TABLET | ORAL | Status: DC
Start: 2019-06-20 — End: 2019-06-20

## 2019-06-20 MED ORDER — ESCITALOPRAM OXALATE 10 MG PO TABS
10.00 | ORAL_TABLET | ORAL | Status: DC
Start: 2019-06-20 — End: 2019-06-20

## 2019-06-20 MED ORDER — MONTELUKAST SODIUM 10 MG PO TABS
10.00 | ORAL_TABLET | ORAL | Status: DC
Start: 2019-06-19 — End: 2019-06-20

## 2019-06-21 DIAGNOSIS — E1121 Type 2 diabetes mellitus with diabetic nephropathy: Secondary | ICD-10-CM | POA: Diagnosis not present

## 2019-06-21 DIAGNOSIS — N184 Chronic kidney disease, stage 4 (severe): Secondary | ICD-10-CM | POA: Diagnosis not present

## 2019-06-21 DIAGNOSIS — I10 Essential (primary) hypertension: Secondary | ICD-10-CM | POA: Diagnosis not present

## 2019-06-21 DIAGNOSIS — I5032 Chronic diastolic (congestive) heart failure: Secondary | ICD-10-CM | POA: Diagnosis not present

## 2019-06-21 DIAGNOSIS — R531 Weakness: Secondary | ICD-10-CM | POA: Diagnosis not present

## 2019-06-21 DIAGNOSIS — I480 Paroxysmal atrial fibrillation: Secondary | ICD-10-CM | POA: Diagnosis not present

## 2019-06-21 DIAGNOSIS — Z794 Long term (current) use of insulin: Secondary | ICD-10-CM | POA: Diagnosis not present

## 2019-06-21 DIAGNOSIS — N1832 Chronic kidney disease, stage 3b: Secondary | ICD-10-CM | POA: Diagnosis not present

## 2019-06-21 DIAGNOSIS — D649 Anemia, unspecified: Secondary | ICD-10-CM | POA: Diagnosis not present

## 2019-06-21 DIAGNOSIS — J449 Chronic obstructive pulmonary disease, unspecified: Secondary | ICD-10-CM | POA: Diagnosis not present

## 2019-06-25 DIAGNOSIS — E039 Hypothyroidism, unspecified: Secondary | ICD-10-CM | POA: Diagnosis present

## 2019-06-25 DIAGNOSIS — Z91040 Latex allergy status: Secondary | ICD-10-CM | POA: Diagnosis not present

## 2019-06-25 DIAGNOSIS — Z7901 Long term (current) use of anticoagulants: Secondary | ICD-10-CM | POA: Diagnosis not present

## 2019-06-25 DIAGNOSIS — Z66 Do not resuscitate: Secondary | ICD-10-CM | POA: Diagnosis present

## 2019-06-25 DIAGNOSIS — N1832 Chronic kidney disease, stage 3b: Secondary | ICD-10-CM | POA: Diagnosis present

## 2019-06-25 DIAGNOSIS — I701 Atherosclerosis of renal artery: Secondary | ICD-10-CM | POA: Diagnosis present

## 2019-06-25 DIAGNOSIS — M6281 Muscle weakness (generalized): Secondary | ICD-10-CM | POA: Diagnosis not present

## 2019-06-25 DIAGNOSIS — I509 Heart failure, unspecified: Secondary | ICD-10-CM | POA: Diagnosis not present

## 2019-06-25 DIAGNOSIS — R0902 Hypoxemia: Secondary | ICD-10-CM | POA: Diagnosis not present

## 2019-06-25 DIAGNOSIS — J90 Pleural effusion, not elsewhere classified: Secondary | ICD-10-CM | POA: Diagnosis not present

## 2019-06-25 DIAGNOSIS — I4891 Unspecified atrial fibrillation: Secondary | ICD-10-CM | POA: Diagnosis not present

## 2019-06-25 DIAGNOSIS — N179 Acute kidney failure, unspecified: Secondary | ICD-10-CM | POA: Diagnosis not present

## 2019-06-25 DIAGNOSIS — R278 Other lack of coordination: Secondary | ICD-10-CM | POA: Diagnosis not present

## 2019-06-25 DIAGNOSIS — D509 Iron deficiency anemia, unspecified: Secondary | ICD-10-CM | POA: Diagnosis present

## 2019-06-25 DIAGNOSIS — I517 Cardiomegaly: Secondary | ICD-10-CM | POA: Diagnosis not present

## 2019-06-25 DIAGNOSIS — R41841 Cognitive communication deficit: Secondary | ICD-10-CM | POA: Diagnosis not present

## 2019-06-25 DIAGNOSIS — N289 Disorder of kidney and ureter, unspecified: Secondary | ICD-10-CM | POA: Diagnosis not present

## 2019-06-25 DIAGNOSIS — Z881 Allergy status to other antibiotic agents status: Secondary | ICD-10-CM | POA: Diagnosis not present

## 2019-06-25 DIAGNOSIS — I4819 Other persistent atrial fibrillation: Secondary | ICD-10-CM | POA: Diagnosis present

## 2019-06-25 DIAGNOSIS — R918 Other nonspecific abnormal finding of lung field: Secondary | ICD-10-CM | POA: Diagnosis not present

## 2019-06-25 DIAGNOSIS — I130 Hypertensive heart and chronic kidney disease with heart failure and stage 1 through stage 4 chronic kidney disease, or unspecified chronic kidney disease: Secondary | ICD-10-CM | POA: Diagnosis present

## 2019-06-25 DIAGNOSIS — J811 Chronic pulmonary edema: Secondary | ICD-10-CM | POA: Diagnosis not present

## 2019-06-25 DIAGNOSIS — R0689 Other abnormalities of breathing: Secondary | ICD-10-CM | POA: Diagnosis not present

## 2019-06-25 DIAGNOSIS — Z794 Long term (current) use of insulin: Secondary | ICD-10-CM | POA: Diagnosis not present

## 2019-06-25 DIAGNOSIS — Z20828 Contact with and (suspected) exposure to other viral communicable diseases: Secondary | ICD-10-CM | POA: Diagnosis not present

## 2019-06-25 DIAGNOSIS — N183 Chronic kidney disease, stage 3 unspecified: Secondary | ICD-10-CM | POA: Diagnosis not present

## 2019-06-25 DIAGNOSIS — E1142 Type 2 diabetes mellitus with diabetic polyneuropathy: Secondary | ICD-10-CM | POA: Diagnosis present

## 2019-06-25 DIAGNOSIS — J432 Centrilobular emphysema: Secondary | ICD-10-CM | POA: Diagnosis present

## 2019-06-25 DIAGNOSIS — I110 Hypertensive heart disease with heart failure: Secondary | ICD-10-CM | POA: Diagnosis not present

## 2019-06-25 DIAGNOSIS — I480 Paroxysmal atrial fibrillation: Secondary | ICD-10-CM | POA: Diagnosis present

## 2019-06-25 DIAGNOSIS — I080 Rheumatic disorders of both mitral and aortic valves: Secondary | ICD-10-CM | POA: Diagnosis not present

## 2019-06-25 DIAGNOSIS — E1169 Type 2 diabetes mellitus with other specified complication: Secondary | ICD-10-CM | POA: Diagnosis present

## 2019-06-25 DIAGNOSIS — E1122 Type 2 diabetes mellitus with diabetic chronic kidney disease: Secondary | ICD-10-CM | POA: Diagnosis present

## 2019-06-25 DIAGNOSIS — N189 Chronic kidney disease, unspecified: Secondary | ICD-10-CM | POA: Diagnosis not present

## 2019-06-25 DIAGNOSIS — F0280 Dementia in other diseases classified elsewhere without behavioral disturbance: Secondary | ICD-10-CM | POA: Diagnosis not present

## 2019-06-25 DIAGNOSIS — G301 Alzheimer's disease with late onset: Secondary | ICD-10-CM | POA: Diagnosis not present

## 2019-06-25 DIAGNOSIS — D649 Anemia, unspecified: Secondary | ICD-10-CM | POA: Diagnosis not present

## 2019-06-25 DIAGNOSIS — J9601 Acute respiratory failure with hypoxia: Secondary | ICD-10-CM | POA: Diagnosis not present

## 2019-06-25 DIAGNOSIS — E1121 Type 2 diabetes mellitus with diabetic nephropathy: Secondary | ICD-10-CM | POA: Diagnosis not present

## 2019-06-25 DIAGNOSIS — E782 Mixed hyperlipidemia: Secondary | ICD-10-CM | POA: Diagnosis present

## 2019-06-25 DIAGNOSIS — R0602 Shortness of breath: Secondary | ICD-10-CM | POA: Diagnosis not present

## 2019-06-25 DIAGNOSIS — J449 Chronic obstructive pulmonary disease, unspecified: Secondary | ICD-10-CM | POA: Diagnosis not present

## 2019-06-25 DIAGNOSIS — Z515 Encounter for palliative care: Secondary | ICD-10-CM | POA: Diagnosis not present

## 2019-06-25 DIAGNOSIS — I2699 Other pulmonary embolism without acute cor pulmonale: Secondary | ICD-10-CM | POA: Diagnosis not present

## 2019-06-25 DIAGNOSIS — G9340 Encephalopathy, unspecified: Secondary | ICD-10-CM | POA: Diagnosis not present

## 2019-06-25 DIAGNOSIS — R069 Unspecified abnormalities of breathing: Secondary | ICD-10-CM | POA: Diagnosis not present

## 2019-06-25 DIAGNOSIS — R410 Disorientation, unspecified: Secondary | ICD-10-CM | POA: Diagnosis not present

## 2019-06-25 DIAGNOSIS — R52 Pain, unspecified: Secondary | ICD-10-CM | POA: Diagnosis not present

## 2019-06-25 DIAGNOSIS — I10 Essential (primary) hypertension: Secondary | ICD-10-CM | POA: Diagnosis not present

## 2019-06-25 DIAGNOSIS — G9341 Metabolic encephalopathy: Secondary | ICD-10-CM | POA: Diagnosis not present

## 2019-06-25 DIAGNOSIS — N184 Chronic kidney disease, stage 4 (severe): Secondary | ICD-10-CM | POA: Diagnosis not present

## 2019-06-25 DIAGNOSIS — I5033 Acute on chronic diastolic (congestive) heart failure: Secondary | ICD-10-CM | POA: Diagnosis present

## 2019-06-25 DIAGNOSIS — I2510 Atherosclerotic heart disease of native coronary artery without angina pectoris: Secondary | ICD-10-CM | POA: Diagnosis not present

## 2019-07-04 MED ORDER — BARO-CAT PO
10.00 | ORAL | Status: DC
Start: ? — End: 2019-07-04

## 2019-07-04 MED ORDER — ACCU-PRO PUMP SET/VENT MISC
2.50 | Status: DC
Start: ? — End: 2019-07-04

## 2019-07-04 MED ORDER — SODIUM CHLORIDE 0.9 % IV SOLN
10.00 | INTRAVENOUS | Status: DC
Start: ? — End: 2019-07-04

## 2019-07-04 MED ORDER — Medication
Status: DC
Start: ? — End: 2019-07-04

## 2019-07-04 MED ORDER — FENTANYL CITRATE (PF) 2500 MCG/50ML IJ SOLN
25.00 | INTRAMUSCULAR | Status: DC
Start: ? — End: 2019-07-04

## 2019-07-04 MED ORDER — EQL HEAVY DUTY FABRIC STRIPS MISC
25.00 | Status: DC
Start: ? — End: 2019-07-04

## 2019-07-04 MED ORDER — LORAZEPAM 2 MG/ML IJ SOLN
1.00 | INTRAMUSCULAR | Status: DC
Start: ? — End: 2019-07-04

## 2019-07-04 MED ORDER — PHENOL EX
60.00 | CUTANEOUS | Status: DC
Start: 2019-07-03 — End: 2019-07-04

## 2019-07-04 MED ORDER — BL TUSSIN CF 30-10-100 MG/5ML PO SYRP
1.00 | ORAL_SOLUTION | ORAL | Status: DC
Start: ? — End: 2019-07-04

## 2019-07-04 MED ORDER — EQUATE NICOTINE 4 MG MT GUM
4.00 | CHEWING_GUM | OROMUCOSAL | Status: DC
Start: ? — End: 2019-07-04

## 2019-07-04 MED ORDER — ONDANSETRON HCL 4 MG/2ML IJ SOLN
4.00 | INTRAMUSCULAR | Status: DC
Start: ? — End: 2019-07-04

## 2019-07-04 MED ORDER — BISACODYL 10 MG RE SUPP
10.00 | RECTAL | Status: DC
Start: ? — End: 2019-07-04

## 2019-07-04 MED ORDER — GENERIC EXTERNAL MEDICATION
Status: DC
Start: ? — End: 2019-07-04

## 2019-07-04 MED ORDER — ALBUTEROL SULFATE (2.5 MG/3ML) 0.083% IN NEBU
2.50 | INHALATION_SOLUTION | RESPIRATORY_TRACT | Status: DC
Start: ? — End: 2019-07-04

## 2019-07-04 MED ORDER — DICLOXACILLIN SODIUM 62.5 MG/5ML PO SUSR
10.00 | ORAL | Status: DC
Start: ? — End: 2019-07-04

## 2019-07-04 MED ORDER — FUROSEMIDE 10 MG/ML IJ SOLN
60.00 | INTRAMUSCULAR | Status: DC
Start: 2019-07-04 — End: 2019-07-04

## 2019-07-15 DEATH — deceased

## 2019-12-04 IMAGING — DX CHEST - 2 VIEW
2 series · 2 of 2 positions shown · non-contrast
Comparison: CT 10/03/2018

CLINICAL DATA: Shortness of breath

EXAM:
CHEST - 2 VIEW

[chest pa]
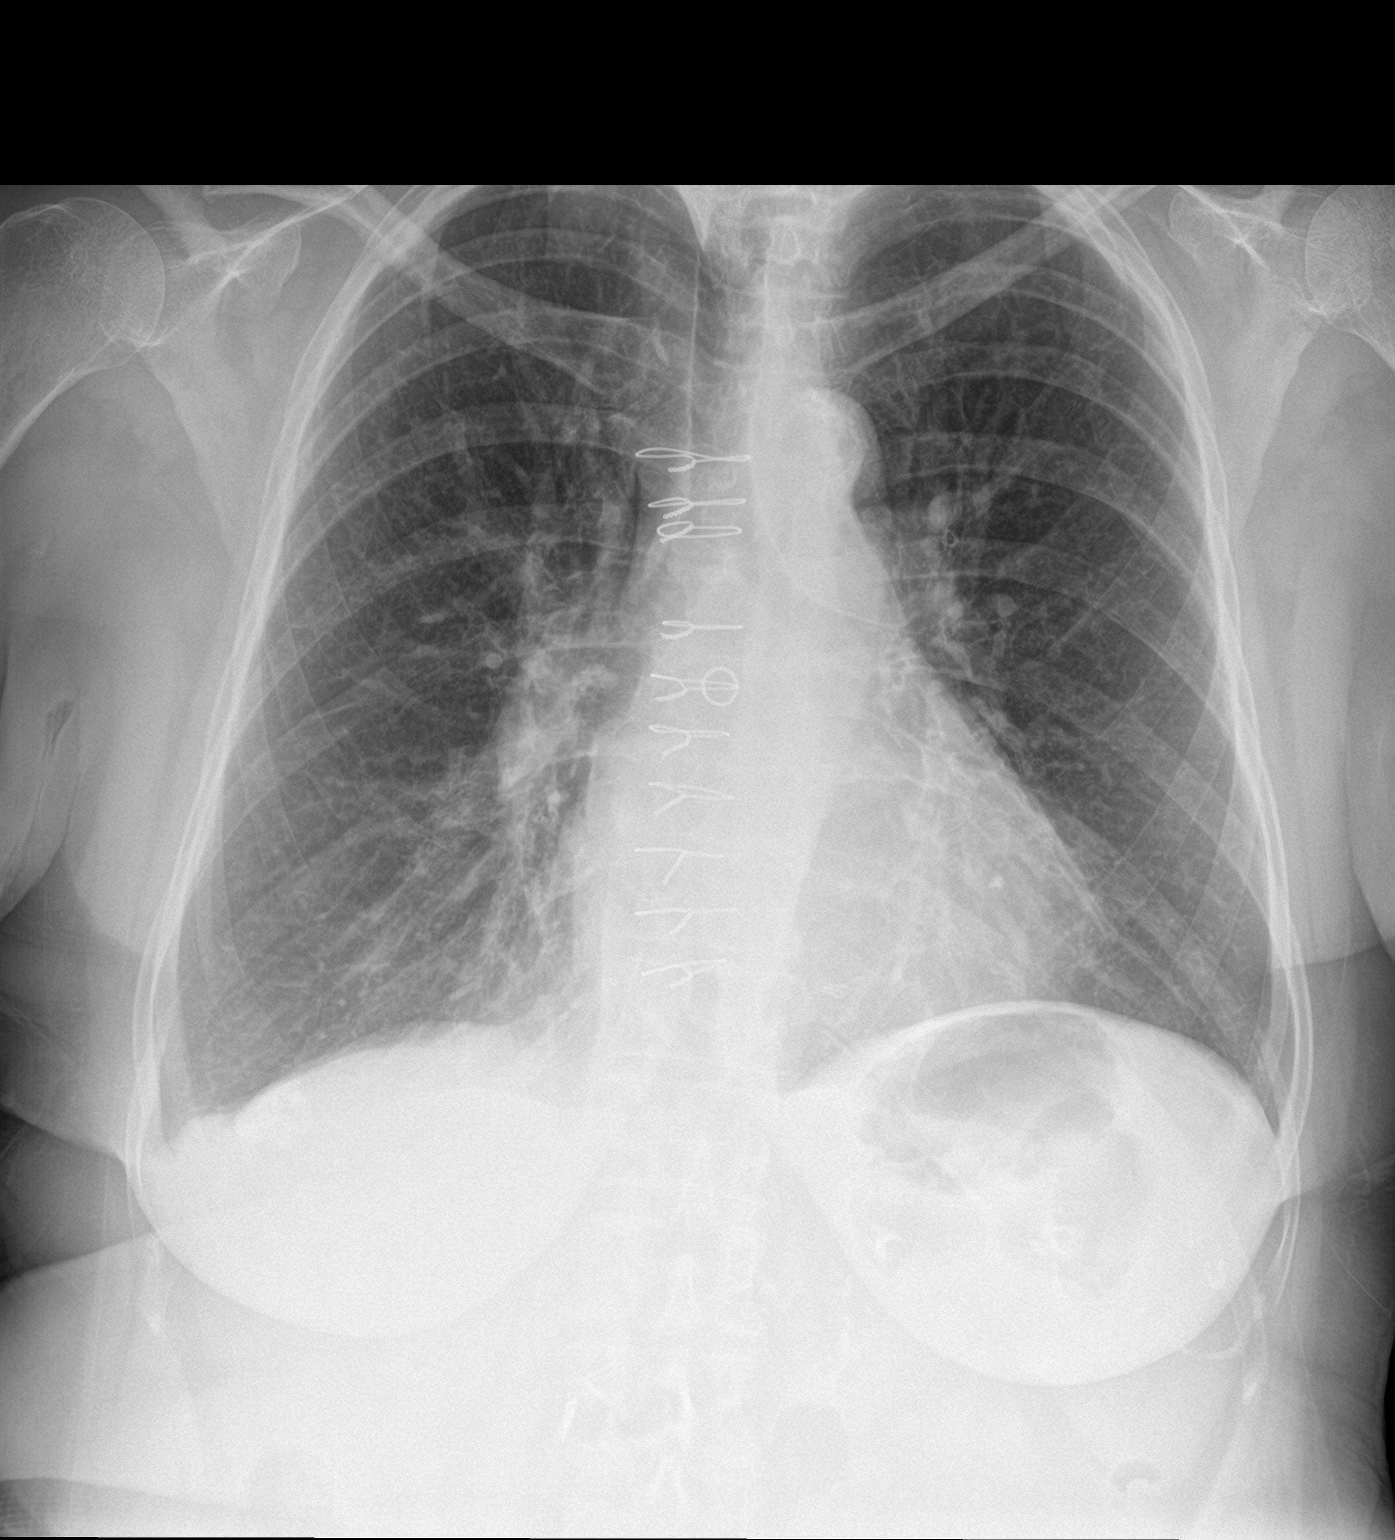

[chest lat]
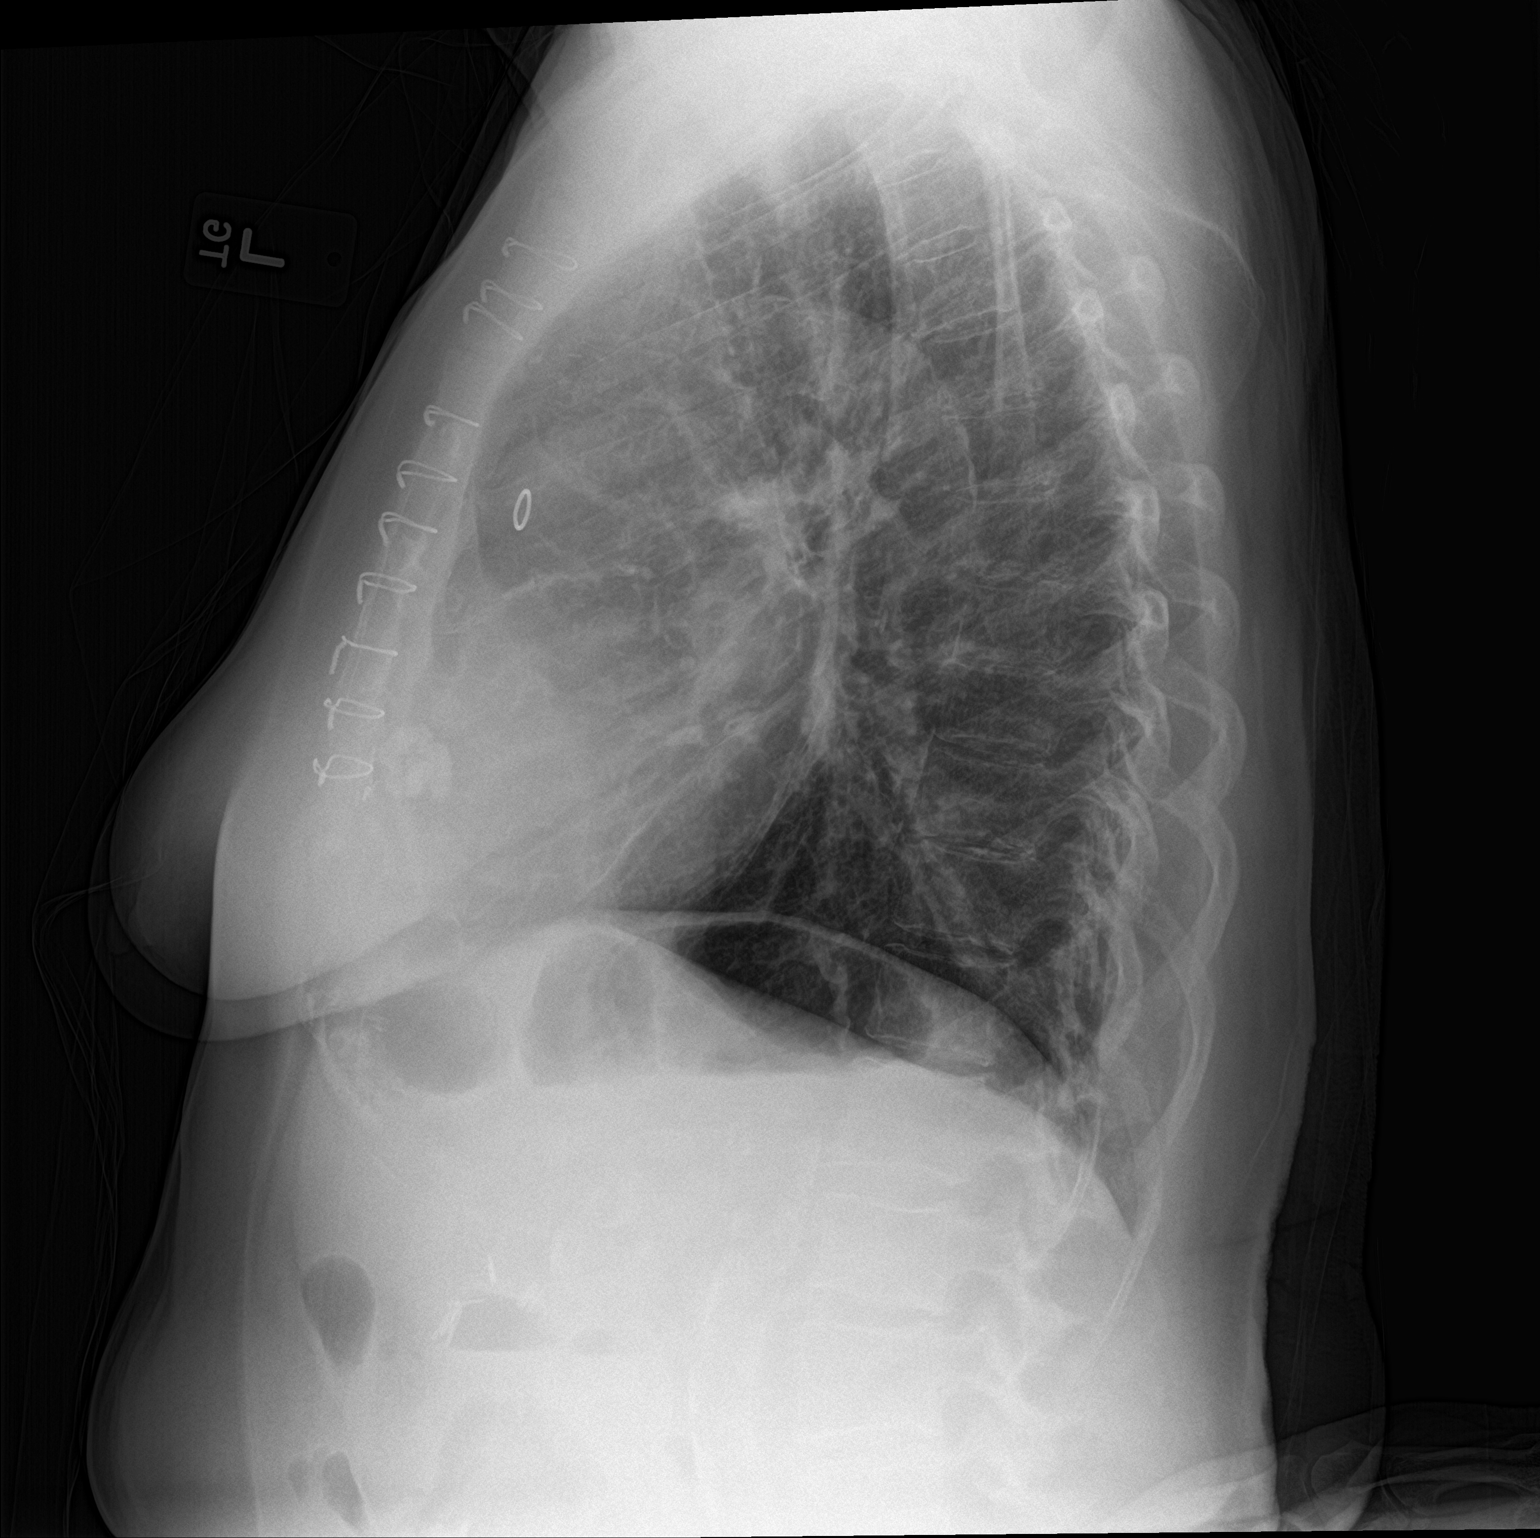

[2 of 2 positions shown; findings below may reference images not displayed]

FINDINGS: Prior CABG. Heart is upper limits normal in size. No confluent
airspace opacities or effusions. No acute bony abnormality.
IMPRESSION: No active cardiopulmonary disease.
